# Patient Record
Sex: Female | Born: 1940 | Race: White | Hispanic: No | Smoking: Former smoker
Health system: Southern US, Community
[De-identification: ages and names within clinical notes are randomized; demographics above are authoritative.]

## PROBLEM LIST (undated history)

## (undated) DIAGNOSIS — I499 Cardiac arrhythmia, unspecified: Secondary | ICD-10-CM

## (undated) DIAGNOSIS — R42 Dizziness and giddiness: Secondary | ICD-10-CM

## (undated) DIAGNOSIS — Z923 Personal history of irradiation: Secondary | ICD-10-CM

## (undated) DIAGNOSIS — I340 Nonrheumatic mitral (valve) insufficiency: Secondary | ICD-10-CM

## (undated) DIAGNOSIS — D649 Anemia, unspecified: Secondary | ICD-10-CM

## (undated) DIAGNOSIS — R7303 Prediabetes: Secondary | ICD-10-CM

## (undated) DIAGNOSIS — F5104 Psychophysiologic insomnia: Secondary | ICD-10-CM

## (undated) DIAGNOSIS — K219 Gastro-esophageal reflux disease without esophagitis: Secondary | ICD-10-CM

## (undated) DIAGNOSIS — F32A Depression, unspecified: Secondary | ICD-10-CM

## (undated) DIAGNOSIS — R413 Other amnesia: Secondary | ICD-10-CM

## (undated) DIAGNOSIS — C50919 Malignant neoplasm of unspecified site of unspecified female breast: Secondary | ICD-10-CM

## (undated) DIAGNOSIS — I493 Ventricular premature depolarization: Secondary | ICD-10-CM

## (undated) DIAGNOSIS — I272 Pulmonary hypertension, unspecified: Secondary | ICD-10-CM

## (undated) DIAGNOSIS — Z8619 Personal history of other infectious and parasitic diseases: Secondary | ICD-10-CM

## (undated) DIAGNOSIS — C801 Malignant (primary) neoplasm, unspecified: Secondary | ICD-10-CM

## (undated) DIAGNOSIS — I341 Nonrheumatic mitral (valve) prolapse: Secondary | ICD-10-CM

## (undated) DIAGNOSIS — IMO0001 Reserved for inherently not codable concepts without codable children: Secondary | ICD-10-CM

## (undated) DIAGNOSIS — T884XXA Failed or difficult intubation, initial encounter: Secondary | ICD-10-CM

## (undated) DIAGNOSIS — G4730 Sleep apnea, unspecified: Secondary | ICD-10-CM

## (undated) DIAGNOSIS — T4145XA Adverse effect of unspecified anesthetic, initial encounter: Secondary | ICD-10-CM

## (undated) DIAGNOSIS — R0601 Orthopnea: Secondary | ICD-10-CM

## (undated) DIAGNOSIS — F329 Major depressive disorder, single episode, unspecified: Secondary | ICD-10-CM

## (undated) DIAGNOSIS — M1990 Unspecified osteoarthritis, unspecified site: Secondary | ICD-10-CM

## (undated) DIAGNOSIS — E871 Hypo-osmolality and hyponatremia: Secondary | ICD-10-CM

## (undated) DIAGNOSIS — Z6837 Body mass index (BMI) 37.0-37.9, adult: Secondary | ICD-10-CM

## (undated) DIAGNOSIS — T8859XA Other complications of anesthesia, initial encounter: Secondary | ICD-10-CM

## (undated) DIAGNOSIS — I10 Essential (primary) hypertension: Secondary | ICD-10-CM

## (undated) HISTORY — PX: TONSILLECTOMY: SUR1361

## (undated) HISTORY — PX: FOOT SURGERY: SHX648

## (undated) HISTORY — PX: REPLACEMENT TOTAL KNEE: SUR1224

## (undated) HISTORY — PX: FRACTURE SURGERY: SHX138

## (undated) HISTORY — DX: Reserved for inherently not codable concepts without codable children: IMO0001

## (undated) HISTORY — PX: PATELLA FRACTURE SURGERY: SHX735

## (undated) HISTORY — PX: OTHER SURGICAL HISTORY: SHX169

## (undated) HISTORY — PX: APPENDECTOMY: SHX54

## (undated) HISTORY — DX: Essential (primary) hypertension: I10

## (undated) HISTORY — PX: CHOLECYSTECTOMY: SHX55

## (undated) HISTORY — DX: Gastro-esophageal reflux disease without esophagitis: K21.9

## (undated) HISTORY — DX: Cardiac arrhythmia, unspecified: I49.9

---

## 2009-02-01 HISTORY — PX: JOINT REPLACEMENT: SHX530

## 2014-01-29 ENCOUNTER — Ambulatory Visit: Payer: Self-pay | Admitting: Podiatry

## 2014-01-30 DIAGNOSIS — J208 Acute bronchitis due to other specified organisms: Secondary | ICD-10-CM | POA: Insufficient documentation

## 2014-02-05 ENCOUNTER — Encounter: Payer: Self-pay | Admitting: Podiatry

## 2014-02-05 ENCOUNTER — Ambulatory Visit (INDEPENDENT_AMBULATORY_CARE_PROVIDER_SITE_OTHER): Payer: Medicare Other | Admitting: Podiatry

## 2014-02-05 VITALS — BP 117/61 | HR 68 | Resp 16 | Ht 63.5 in | Wt 206.0 lb

## 2014-02-05 DIAGNOSIS — B351 Tinea unguium: Secondary | ICD-10-CM

## 2014-02-05 DIAGNOSIS — M79676 Pain in unspecified toe(s): Secondary | ICD-10-CM

## 2014-02-05 NOTE — Progress Notes (Signed)
   Subjective:    Patient ID: Courtney Lynn, female    DOB: September 12, 1940, 74 y.o.   MRN: [id number]  HPI Comments: 74 year old female presents the office for painful elongated toenails. She states that she recently moved here from [place] where she regularly saw a podiatrist and she would like to get established here. She says her nails are painful particularly with shoe gear. She denies any redness or drainage from around the nail sites.  She denies any recent injury or trauma to bilateral lower extremities. She states that she used to wear custom orthotics in her shoes however made her feel as if she is walking on the ball of her foot and her the area and she discontinued wearing them. Since discontinuing wearing the orthotic she's had no pain to her feet. Other than her nails, she has no other complaints at this time.     Review of Systems  Musculoskeletal: Positive for back pain.  All other systems reviewed and are negative.      Objective:   Physical Exam AAO 3, NAD DP/PT pulses palpable, CRT less than 3 seconds Protective sensation intact with Simms Weinstein monofilament, vibratory sensation intact, Achilles tendon reflex intact. Nails hypertrophic, dystrophic, elongated, brittle, discolored 10. No surrounding erythema or drainage from around the nail sites. No open lesions or pre-ulcerative lesions. No interdigital maceration. Bilateral rigid hammertoe contractures and bunion deformity with a right greater than left. No areas of pinpoint bony tenderness or pain with vibratory sensation to bilateral lower extremity. No overlying edema, erythema, increase in warmth bilaterally. MMT 5/5, ROM WNL No pain with calf compression, swelling, warmth, erythema.       Assessment & Plan:  74 year old female with symptomatic onychomycosis -Treatment options were discussed the patient including alternatives, risks, complications. -Nails were sharply debrided 10 without complications to  patient comfort. -Discussed the importance of daily foot inspection. -Follow-up in 9 weeks or sooner should any problems arise. In the meantime, encouraged call the office with any questions, concerns, change in symptoms. Follow-up with PCP for other issues mentioned in the ROS.

## 2014-03-28 DIAGNOSIS — R7303 Prediabetes: Secondary | ICD-10-CM | POA: Insufficient documentation

## 2014-03-28 DIAGNOSIS — F325 Major depressive disorder, single episode, in full remission: Secondary | ICD-10-CM | POA: Insufficient documentation

## 2014-03-28 DIAGNOSIS — K219 Gastro-esophageal reflux disease without esophagitis: Secondary | ICD-10-CM | POA: Insufficient documentation

## 2014-03-28 DIAGNOSIS — I10 Essential (primary) hypertension: Secondary | ICD-10-CM | POA: Insufficient documentation

## 2014-03-28 DIAGNOSIS — F329 Major depressive disorder, single episode, unspecified: Secondary | ICD-10-CM | POA: Insufficient documentation

## 2014-03-28 DIAGNOSIS — Z6837 Body mass index (BMI) 37.0-37.9, adult: Secondary | ICD-10-CM | POA: Insufficient documentation

## 2014-03-28 DIAGNOSIS — M170 Bilateral primary osteoarthritis of knee: Secondary | ICD-10-CM | POA: Insufficient documentation

## 2014-03-28 DIAGNOSIS — F32A Depression, unspecified: Secondary | ICD-10-CM | POA: Insufficient documentation

## 2014-03-28 DIAGNOSIS — G4700 Insomnia, unspecified: Secondary | ICD-10-CM | POA: Insufficient documentation

## 2014-03-28 DIAGNOSIS — F5104 Psychophysiologic insomnia: Secondary | ICD-10-CM | POA: Insufficient documentation

## 2014-03-28 DIAGNOSIS — E668 Other obesity: Secondary | ICD-10-CM | POA: Insufficient documentation

## 2014-04-23 DIAGNOSIS — D649 Anemia, unspecified: Secondary | ICD-10-CM | POA: Insufficient documentation

## 2014-05-07 ENCOUNTER — Ambulatory Visit (INDEPENDENT_AMBULATORY_CARE_PROVIDER_SITE_OTHER): Payer: Medicare Other | Admitting: Podiatry

## 2014-05-07 ENCOUNTER — Encounter: Payer: Self-pay | Admitting: Podiatry

## 2014-05-07 DIAGNOSIS — M79676 Pain in unspecified toe(s): Secondary | ICD-10-CM

## 2014-05-07 DIAGNOSIS — B351 Tinea unguium: Secondary | ICD-10-CM | POA: Diagnosis not present

## 2014-05-07 NOTE — Progress Notes (Signed)
Patient ID: Courtney Lynn, female   DOB: July 03, 1940, 74 y.o.   MRN: [id number]  Subjective: 74 y.o.-year-old female returns the office today for painful, elongated, thickened toenails which she is unable to trim herself. Denies any redness or drainage around the nails. Consider night she does have some discomfort to her feet after being on them all day. She denies any swelling or redness. Denies any history of injury or trauma. She has no pain throughout the day with ambulation. Denies any acute changes since last appointment and no new complaints today. Denies any systemic complaints such as fevers, chills, nausea, vomiting.   Objective: AAO 3, NAD DP/PT pulses palpable, CRT less than 3 seconds Protective sensation intact with Simms Weinstein monofilament, Achilles tendon reflex intact.  Nails hypertrophic, dystrophic, elongated, brittle, discolored 10. There is tenderness overlying the nails 1-5 bilaterally. There is no surrounding erythema or drainage along the nail sites. No open lesions or pre-ulcerative lesions are identified.  Subjectively over on the dorsal aspect in the midfoot bilaterally is where the majority the discomfort is at nighttime. Currently there is no areas of pinpoint bony tenderness or pain with vibratory sensation. There is no overlying edema, erythema, increase in warmth bilaterally. There is hammertoe contractures as well as HAV deformity present. There is a decrease in medial arch height upon weightbearing. Dorsal exostosis palpable off of the midfoot. No pain with calf compression, swelling, warmth, erythema.  Assessment: Patient presents with symptomatic onychomycosis; midfoot arthritis  Plan: -Treatment options including alternatives, risks, complications were discussed -Nails sharply debrided 10 without complication/bleeding. -Patient likely benefit from diabetic shoes and inserts in her foot type. A prescription for diabetic shoes were given to the patient to go to  [facility].  -Discussed daily foot inspection. If there are any changes, to call the office immediately.  -Follow-up in 3 months or sooner if any problems are to arise. In the meantime, encouraged to call the office with any questions, concerns, changes symptoms.

## 2014-07-01 ENCOUNTER — Observation Stay
Admission: EM | Admit: 2014-07-01 | Discharge: 2014-07-02 | Disposition: A | Discharge status: Home / Self Care | Payer: Medicare Other | Attending: Internal Medicine | Admitting: Internal Medicine

## 2014-07-01 ENCOUNTER — Encounter: Payer: Self-pay | Admitting: Emergency Medicine

## 2014-07-01 ENCOUNTER — Emergency Department: Payer: Medicare Other

## 2014-07-01 DIAGNOSIS — Z880 Allergy status to penicillin: Secondary | ICD-10-CM | POA: Diagnosis not present

## 2014-07-01 DIAGNOSIS — D649 Anemia, unspecified: Secondary | ICD-10-CM | POA: Diagnosis not present

## 2014-07-01 DIAGNOSIS — R531 Weakness: Secondary | ICD-10-CM | POA: Insufficient documentation

## 2014-07-01 DIAGNOSIS — Z8489 Family history of other specified conditions: Secondary | ICD-10-CM | POA: Diagnosis not present

## 2014-07-01 DIAGNOSIS — E871 Hypo-osmolality and hyponatremia: Secondary | ICD-10-CM | POA: Insufficient documentation

## 2014-07-01 DIAGNOSIS — K219 Gastro-esophageal reflux disease without esophagitis: Secondary | ICD-10-CM | POA: Insufficient documentation

## 2014-07-01 DIAGNOSIS — Z965 Presence of tooth-root and mandibular implants: Secondary | ICD-10-CM | POA: Diagnosis not present

## 2014-07-01 DIAGNOSIS — M1990 Unspecified osteoarthritis, unspecified site: Secondary | ICD-10-CM | POA: Insufficient documentation

## 2014-07-01 DIAGNOSIS — I10 Essential (primary) hypertension: Secondary | ICD-10-CM | POA: Insufficient documentation

## 2014-07-01 DIAGNOSIS — Z79899 Other long term (current) drug therapy: Secondary | ICD-10-CM | POA: Insufficient documentation

## 2014-07-01 DIAGNOSIS — F329 Major depressive disorder, single episode, unspecified: Secondary | ICD-10-CM | POA: Diagnosis not present

## 2014-07-01 DIAGNOSIS — Z881 Allergy status to other antibiotic agents status: Secondary | ICD-10-CM | POA: Diagnosis not present

## 2014-07-01 DIAGNOSIS — Z833 Family history of diabetes mellitus: Secondary | ICD-10-CM | POA: Diagnosis not present

## 2014-07-01 DIAGNOSIS — R001 Bradycardia, unspecified: Secondary | ICD-10-CM | POA: Diagnosis present

## 2014-07-01 DIAGNOSIS — Z96651 Presence of right artificial knee joint: Secondary | ICD-10-CM | POA: Diagnosis not present

## 2014-07-01 DIAGNOSIS — G4730 Sleep apnea, unspecified: Secondary | ICD-10-CM | POA: Insufficient documentation

## 2014-07-01 HISTORY — DX: Unspecified osteoarthritis, unspecified site: M19.90

## 2014-07-01 HISTORY — DX: Major depressive disorder, single episode, unspecified: F32.9

## 2014-07-01 HISTORY — DX: Anemia, unspecified: D64.9

## 2014-07-01 HISTORY — DX: Depression, unspecified: F32.A

## 2014-07-01 HISTORY — DX: Sleep apnea, unspecified: G47.30

## 2014-07-01 LAB — CBC WITH DIFFERENTIAL/PLATELET
Basophils Absolute: 0.1 10*3/uL (ref 0–0.1)
Basophils Relative: 2 %
EOS ABS: 0.2 10*3/uL (ref 0–0.7)
Eosinophils Relative: 3 %
HCT: 37.1 % (ref 35.0–47.0)
Hemoglobin: 12.3 g/dL (ref 12.0–16.0)
Lymphocytes Relative: 22 %
Lymphs Abs: 1.6 10*3/uL (ref 1.0–3.6)
MCH: 29.6 pg (ref 26.0–34.0)
MCHC: 33.1 g/dL (ref 32.0–36.0)
MCV: 89.4 fL (ref 80.0–100.0)
MONOS PCT: 9 %
Monocytes Absolute: 0.6 10*3/uL (ref 0.2–0.9)
NEUTROS PCT: 64 %
Neutro Abs: 5 10*3/uL (ref 1.4–6.5)
PLATELETS: 299 10*3/uL (ref 150–440)
RBC: 4.15 MIL/uL (ref 3.80–5.20)
RDW: 14.2 % (ref 11.5–14.5)
WBC: 7.6 10*3/uL (ref 3.6–11.0)

## 2014-07-01 LAB — URINALYSIS COMPLETE WITH MICROSCOPIC (ARMC ONLY)
BILIRUBIN URINE: NEGATIVE
Bacteria, UA: NONE SEEN
Glucose, UA: NEGATIVE mg/dL
Hgb urine dipstick: NEGATIVE
Ketones, ur: NEGATIVE mg/dL
Leukocytes, UA: NEGATIVE
Nitrite: NEGATIVE
PH: 6 (ref 5.0–8.0)
Protein, ur: NEGATIVE mg/dL
RBC / HPF: NONE SEEN RBC/hpf (ref 0–5)
SPECIFIC GRAVITY, URINE: 1.004 — AB (ref 1.005–1.030)

## 2014-07-01 LAB — CBC
HCT: 37.3 % (ref 35.0–47.0)
Hemoglobin: 12.4 g/dL (ref 12.0–16.0)
MCH: 29.4 pg (ref 26.0–34.0)
MCHC: 33.2 g/dL (ref 32.0–36.0)
MCV: 88.6 fL (ref 80.0–100.0)
Platelets: 298 10*3/uL (ref 150–440)
RBC: 4.21 MIL/uL (ref 3.80–5.20)
RDW: 14 % (ref 11.5–14.5)
WBC: 6.7 10*3/uL (ref 3.6–11.0)

## 2014-07-01 LAB — CREATININE, SERUM
Creatinine, Ser: 0.91 mg/dL (ref 0.44–1.00)
GFR calc non Af Amer: >60 mL/min (ref >60)

## 2014-07-01 LAB — COMPREHENSIVE METABOLIC PANEL
ALBUMIN: 3.8 g/dL (ref 3.5–5.0)
ALK PHOS: 57 U/L (ref 38–126)
ALT: 42 U/L (ref 14–54)
AST: 36 U/L (ref 15–41)
Anion gap: 7 (ref 5–15)
BUN: 29 mg/dL — ABNORMAL HIGH (ref 6–20)
CO2: 22 mmol/L (ref 22–32)
Calcium: 8.3 mg/dL — ABNORMAL LOW (ref 8.9–10.3)
Chloride: 96 mmol/L — ABNORMAL LOW (ref 101–111)
Creatinine, Ser: 1.04 mg/dL — ABNORMAL HIGH (ref 0.44–1.00)
GFR calc Af Amer: 59 mL/min — ABNORMAL LOW (ref >60)
GFR, EST NON AFRICAN AMERICAN: 51 mL/min — AB (ref >60)
Glucose, Bld: 87 mg/dL (ref 65–99)
Potassium: 3.8 mmol/L (ref 3.5–5.1)
SODIUM: 125 mmol/L — AB (ref 135–145)
TOTAL PROTEIN: 6.7 g/dL (ref 6.5–8.1)
Total Bilirubin: 0.4 mg/dL (ref 0.3–1.2)

## 2014-07-01 LAB — TSH: TSH: 0.652 u[IU]/mL (ref 0.350–4.500)

## 2014-07-01 LAB — TROPONIN I

## 2014-07-01 LAB — T4, FREE: Free T4: 0.86 ng/dL (ref 0.61–1.12)

## 2014-07-01 MED ORDER — SODIUM CHLORIDE 0.9 % IV BOLUS (SEPSIS)
500.0000 mL | Freq: Once | INTRAVENOUS | Status: AC
  Administered 2014-07-01: 500 mL via INTRAVENOUS

## 2014-07-01 MED ORDER — SODIUM CHLORIDE 0.9 % IJ SOLN
3.0000 mL | Freq: Two times a day (BID) | INTRAMUSCULAR | Status: DC
  Administered 2014-07-02: 3 mL via INTRAVENOUS

## 2014-07-01 MED ORDER — ACETAMINOPHEN 650 MG RE SUPP: 650.0000 mg | Freq: Four times a day (QID) | RECTAL | Status: DC | PRN

## 2014-07-01 MED ORDER — POLYETHYLENE GLYCOL 3350 17 G PO PACK
17.0000 g | PACK | Freq: Every day | ORAL | Status: DC | PRN
Start: 2014-07-01 — End: 2014-07-02

## 2014-07-01 MED ORDER — HEPARIN SODIUM (PORCINE) 5000 UNIT/ML IJ SOLN
5000.0000 [IU] | Freq: Three times a day (TID) | INTRAMUSCULAR | Status: DC
  Filled 2014-07-01: qty 1

## 2014-07-01 MED ORDER — ACETAMINOPHEN 325 MG PO TABS: 650.0000 mg | ORAL_TABLET | Freq: Four times a day (QID) | ORAL | Status: DC | PRN

## 2014-07-01 NOTE — ED Provider Notes (Signed)
[facility] Emergency Department Provider Note  ____________________________________________  Time seen: Approximately 5:07 PM  I have reviewed the triage vital signs and the nursing notes.   HISTORY  Chief Complaint Bradycardia and Weakness    HPI Courtney Lynn is a 74 y.o. female with history of hypertension, gastric reflux, occasional "irregular heart beat" presents for evaluation of palpitations and lightheadedness. Patient reports that she awoke this morning in her usual state of health. She recently turned from a long trip to [place]. This morning she was swimming and afterwards she noted that she was quite tired, she reports that she felt her heart rate and it was slow.  She has felt generally weak with fatigue today. She went home and checked her blood pressure and systolic was in the 00Q which she reports is abnormal for her. On EMS arrival, EKG was sinus bradycardia in the 40s which improved to heart rate of 60s without any intervention. Patient denies any chest pain or difficulty breathing today. She was always been her usual state of health without cough, sneezing, runny nose, congestion, fevers. Currently she denies any palpitations. No modifying factors.   Past Medical History  Diagnosis Date  . Hypertension   . Reflux   . GERD (gastroesophageal reflux disease)   . Irregular heart beat     There are no active problems to display for this patient.   Past Surgical History  Procedure Laterality Date  . Dental implants    . Appendectomy    . Gallbladder surgery    . Foot surgery    . Replacement total knee Right     Current Outpatient Rx  Name  Route  Sig  Dispense  Refill  . amLODipine (NORVASC) 5 MG tablet   Oral   Take 5 mg by mouth daily.         . cephALEXin (KEFLEX) 500 MG capsule   Oral   Take 500 mg by mouth 4 (four) times daily. Dental procedures         . citalopram (CELEXA) 20 MG tablet   Oral   Take 20 mg by mouth  daily.         . Esterified Estrogens (MENEST) 0.3 MG tablet   Oral   Take 0.3 mg by mouth.         . losartan-hydrochlorothiazide (HYZAAR) 50-12.5 MG per tablet   Oral   Take 1 tablet by mouth daily. 1/2 pill a day         . medroxyPROGESTERone (PROVERA) 2.5 MG tablet   Oral   Take 2.5 mg by mouth daily.         . meloxicam (MOBIC) 15 MG tablet   Oral   Take 15 mg by mouth daily.         . metoprolol succinate (TOPROL-XL) 50 MG 24 hr tablet   Oral   Take 50 mg by mouth daily. Take with or immediately following a meal.         . omeprazole (PRILOSEC) 20 MG capsule   Oral   Take 20 mg by mouth daily.           Allergies Cleocin and Pen vk  No family history on file.  Social History History  Substance Use Topics  . Smoking status: Former Research scientist (life sciences)  . Smokeless tobacco: Never Used  . Alcohol Use: No    Review of Systems Constitutional: No fever/chills Eyes: No visual changes. ENT: No sore throat. Cardiovascular: Denies chest pain. Respiratory:  Denies shortness of breath. Gastrointestinal: No abdominal pain.  No nausea, no vomiting.  No diarrhea.  No constipation. Genitourinary: Negative for dysuria. Musculoskeletal: Negative for back pain. Skin: Negative for rash. Neurological: Negative for headaches, focal weakness or numbness.  10-point ROS otherwise negative.  ____________________________________________   PHYSICAL EXAM:   Filed Vitals:   07/01/14 1712  BP: 145/67  Pulse: 62  Temp: 97.6 F (36.4 C)  TempSrc: Oral  Height: 5\' 3"  (1.6 m)  Weight: 205 lb (92.987 kg)  SpO2: 99%      Constitutional: Alert and oriented. Well appearing and in no acute distress. Eyes: Conjunctivae are normal. PERRL. EOMI. Head: Atraumatic. Nose: No congestion/rhinnorhea. Mouth/Throat: Mucous membranes are moist.  Oropharynx non-erythematous. Neck: No stridor.  Cardiovascular: Bradycardic rate, regular rhythm. Grossly normal heart sounds.  Good  peripheral circulation. Respiratory: Normal respiratory effort.  No retractions. Lungs CTAB. Gastrointestinal: Soft and nontender. No distention. No abdominal bruits. No CVA tenderness. Genitourinary: deferred Musculoskeletal: No lower extremity tenderness nor edema.  No joint effusions. Neurologic:  Normal speech and language. No gross focal neurologic deficits are appreciated. Speech is normal. No gait instability. 5 out of 5 strength in bilateral upper and lower extremities, sensation intact to light touch throughout Skin:  Skin is warm, dry and intact. No rash noted. Psychiatric: Mood and affect are normal. Speech and behavior are normal.  ____________________________________________   LABS (all labs ordered are listed, but only abnormal results are displayed)  Labs Reviewed  COMPREHENSIVE METABOLIC PANEL - Abnormal; Notable for the following:    Sodium 125 (*)    Chloride 96 (*)    BUN 29 (*)    Creatinine, Ser 1.04 (*)    Calcium 8.3 (*)    GFR calc non Af Amer 51 (*)    GFR calc Af Amer 59 (*)    All other components within normal limits  URINALYSIS COMPLETEWITH MICROSCOPIC ([facility] ONLY) - Abnormal; Notable for the following:    Color, Urine YELLOW (*)    APPearance CLEAR (*)    Specific Gravity, Urine 1.004 (*)    Squamous Epithelial / LPF 0-5 (*)    All other components within normal limits  CBC WITH DIFFERENTIAL/PLATELET  TROPONIN I  TSH  T4, FREE   ____________________________________________  EKG  ED ECG REPORT I, Joanne Gavel, the attending physician, personally viewed and interpreted this ECG.   Date: 07/01/2014  EKG Time: 17:03  Rate: 64  Rhythm: normal sinus rhythm  Axis: normal  Intervals:first-degree A-V block   ST&T Change: No acute ST segment elevation  ____________________________________________  RADIOLOGY  CXR IMPRESSION: Mild opacification over the anterior lung bases on the lateral film which may be due to atelectasis or  infection. ____________________________________________   PROCEDURES  Procedure(s) performed: None  Critical Care performed: Yes, see critical care note(s). Total critical care time spent 35 minutes.  ____________________________________________   INITIAL IMPRESSION / ASSESSMENT AND PLAN / ED COURSE  Pertinent labs & imaging results that were available during my care of the patient were reviewed by me and considered in my medical decision making (see chart for details).  Courtney Lynn is a 74 y.o. female with history of hypertension, gastric reflux, occasional "irregular heart beat" presents for evaluation of palpitations and lightheadedness. On exam, she is generally well-appearing and in no acute distress. Exam is benign including intact neurological examination. Suspect bradycardia as cause of her lightheadedness and palpitations today which were worsened by exertion. She denies any chest pain or difficulty breathing and  I doubt ACS or PE. She does take metoprolol for blood pressure control. We'll observe her on the monitor, obtain basic labs, chest x-ray.   ----------------------------------------- 7:42 PM on 07/01/2014 -----------------------------------------  Patient noted to be hyponatremic at 125. We'll give IV fluids. Discussed with hospitalist for admission as this may be the cause of her weakness. ____________________________________________   FINAL CLINICAL IMPRESSION(S) / ED DIAGNOSES  Final diagnoses:  Hyponatremia  Bradycardia      Joanne Gavel, MD 07/01/14 1942

## 2014-07-01 NOTE — H&P (Signed)
[facility] [place] NAME: Courtney Lynn    MR#:  [id number]  DATE OF BIRTH:  07/13/1940  DATE OF ADMISSION:  07/01/2014  PRIMARY CARE PHYSICIAN: Glendon Axe, MD   REQUESTING/REFERRING PHYSICIAN: Dr Edd Fabian  CHIEF COMPLAINT:   Chief Complaint  Patient presents with  . Bradycardia  . Weakness    HISTORY OF PRESENT ILLNESS:  Courtney Lynn  is a 74 y.o. female with a known history of hypertension, depression, anemia and gastroesophageal reflux disease who presents today with bradycardia and hypotension. She has just returned from a 9 day trip to [place]. She woke up this morning in her usual state of health. She went swimming this afternoon and while swimming felt very strange. She went to the side of the pool and had a difficult time finding her pulse when she did find it was very slow and weak. She then went home and took her blood pressure which was 96/54 and her heart rate was in the 40s. She noticed that she was dizzy when trying to move around her apartment. This lasted for about 2 hours before she called EMS. In the a.m. and she had a heart rate of 46 with a first-degree AV block which then resolves and then she had a heart rate of 60. On presentation to the emergency room she is also found to be hyponatremic at 125.  PAST MEDICAL HISTORY:   Past Medical History  Diagnosis Date  . Hypertension   . Reflux   . GERD (gastroesophageal reflux disease)   . Irregular heart beat   . Sleep apnea   . Anemia   . Arthritis   . Depression     PAST SURGICAL HISTORY:   Past Surgical History  Procedure Laterality Date  . Dental implants    . Appendectomy    . Gallbladder surgery    . Foot surgery    . Replacement total knee Right     SOCIAL HISTORY:   History  Substance Use Topics  . Smoking status: Former Research scientist (life sciences)  . Smokeless tobacco: Never Used  . Alcohol Use: No    FAMILY HISTORY:   Family History  Problem Relation Age  of Onset  . GI Bleed Mother   . Diabetes Father   . Congestive Heart Failure Father     DRUG ALLERGIES:   Allergies  Allergen Reactions  . Cleocin [Clindamycin Hcl] Swelling  . Pen Vk [Penicillin V] Swelling and Other (See Comments)    Funny feeling on the tongue    REVIEW OF SYSTEMS:   Review of Systems  Constitutional: Negative for fever, chills, weight loss and malaise/fatigue.  HENT: Negative for congestion and hearing loss.   Eyes: Negative for blurred vision and pain.  Respiratory: Negative for cough, hemoptysis, sputum production, shortness of breath and stridor.   Cardiovascular: Positive for palpitations. Negative for chest pain, orthopnea and leg swelling.  Gastrointestinal: Negative for nausea, vomiting, abdominal pain, diarrhea, constipation and blood in stool.  Genitourinary: Negative for dysuria and frequency.  Musculoskeletal: Negative for myalgias, back pain, joint pain and neck pain.  Skin: Negative for rash.  Neurological: Positive for dizziness. Negative for sensory change, speech change, focal weakness, seizures, loss of consciousness and headaches.  Endo/Heme/Allergies: Does not bruise/bleed easily.  Psychiatric/Behavioral: Negative for depression and hallucinations. The patient is not nervous/anxious.     MEDICATIONS AT HOME:   Prior to Admission medications   Medication Sig Start Date End Date Taking? Authorizing Provider  amLODipine (NORVASC) 10 MG tablet Take 10 mg by mouth every evening.    Yes Historical Provider, MD  citalopram (CELEXA) 20 MG tablet Take 20 mg by mouth daily. Pt states weaning self off of medication and only takes 1/2 tablet every fourth night   Yes Historical Provider, MD  meloxicam (MOBIC) 15 MG tablet Take 15 mg by mouth daily. Pt takes 1/2 tablet daily   Yes Historical Provider, MD  metoprolol succinate (TOPROL-XL) 50 MG 24 hr tablet Take 50 mg by mouth 2 (two) times daily. Pt takes 1/2 tablet in the morning and 1/2 tablet every  evening   Yes Historical Provider, MD  omeprazole (PRILOSEC) 20 MG capsule Take 20 mg by mouth 2 (two) times daily.    Yes Historical Provider, MD  cephALEXin (KEFLEX) 500 MG capsule Take 500 mg by mouth 4 (four) times daily. Dental procedures    Historical Provider, MD      VITAL SIGNS:  Blood pressure 145/67, pulse 62, temperature 97.6 F (36.4 C), temperature source Oral, height 5\' 3"  (1.6 m), weight 92.987 kg (205 lb), SpO2 99 %.  PHYSICAL EXAMINATION:  GENERAL:  74 y.o.-year-old patient lying in the bed with no acute distress.  EYES: Pupils equal, round, reactive to light and accommodation. No scleral icterus. Extraocular muscles intact.  HEENT: Head atraumatic, normocephalic. Oropharynx and nasopharynx clear. Oral mucosa pink and moist good dentition NECK:  Supple, no jugular venous distention. No thyroid enlargement, no tenderness.  LUNGS: Normal breath sounds bilaterally, no wheezing, rales,rhonchi or crepitation. No use of accessory muscles of respiration.  CARDIOVASCULAR: S1, S2 normal. No murmurs, rubs, or gallops. Bout 60 bpm ABDOMEN: Soft, nontender, nondistended. Bowel sounds present. No organomegaly or mass.  EXTREMITIES: No pedal edema, cyanosis, or clubbing.  NEUROLOGIC: Cranial nerves II through XII are intact. Muscle strength 5/5 in all extremities. Sensation intact. Gait not checked.  PSYCHIATRIC: The patient is alert and oriented x 3.  SKIN: No obvious rash, lesion, or ulcer.   LABORATORY PANEL:   CBC  Recent Labs Lab 07/01/14 1728  WBC 7.6  HGB 12.3  HCT 37.1  PLT 299   ------------------------------------------------------------------------------------------------------------------  Chemistries   Recent Labs Lab 07/01/14 1728  NA 125*  K 3.8  CL 96*  CO2 22  GLUCOSE 87  BUN 29*  CREATININE 1.04*  CALCIUM 8.3*  AST 36  ALT 42  ALKPHOS 57  BILITOT 0.4    ------------------------------------------------------------------------------------------------------------------  Cardiac Enzymes  Recent Labs Lab 07/01/14 1728  TROPONINI <0.03   ------------------------------------------------------------------------------------------------------------------  RADIOLOGY:  Dg Chest 2 View  07/01/2014   CLINICAL DATA:  Fatigue with irregular heartbeat.  EXAM: CHEST  2 VIEW  COMPARISON:  None.  FINDINGS: Lungs are adequately inflated without effusion or pneumothorax. Mild opacification over the anterior lung base on the lateral film as cannot exclude atelectasis versus infection. Cardiomediastinal silhouette is within normal. There are mild degenerative changes of the spine  IMPRESSION: Mild opacification over the anterior lung bases on the lateral film which may be due to atelectasis or infection.   Electronically Signed   By: Marin Olp M.D.   On: 07/01/2014 18:06    EKG:  No orders found for this or any previous visit.  IMPRESSION AND PLAN:   Principal Problem:   Bradycardia Active Problems:   Hypertension   Hyponatremia   problem #1 symptomatic bradycardia: Seems to have resolved and she is now in normal sinus rhythm in the 60s. We'll hold metoprolol. Monitor on telemetry. Normalize sodium. Cycle  cardiac enzymes, first is negative. Cardiology consultation of the discretion of primary team.  #2 hyponatremia: She states she has never been told she was hyponatremic before. We'll check a TSH. She is on Celexa but has tapered her dose to about 5 mg every 4 days I do not think this is the culprit. She is recently spent 9 days in [place] states that she drank a lot of water and does not often use salt in her food. She may have hyponatremia as a result of sweating and inadequate replacement. BUN and creatinine also elevated possibly dehydrated. Will check urine sodium and osmolality. Will start normal saline.  #3 hypertension: Was bradycardic  earlier today and has now normalized. Will hold antihypertensives for tonight.  All the records are reviewed and case discussed with ED provider. Management plans discussed with the patient, family and they are in agreement.  CODE STATUS: Full  TOTAL TIME TAKING CARE OF THIS PATIENT: 45 minutes.    Myrtis Ser M.D on 07/01/2014 at 8:20 PM  Between 7am to 6pm - Pager - [phone]  After 6pm go to [email or web address] - password [email or web address] [facility]  Office  [phone]  CC: Primary care physician; Glendon Axe, MD

## 2014-07-01 NOTE — ED Notes (Signed)
Patient to ER from home for c/o weakness. States she just returned from long trip to [place] where she did a lot of walking. Was swimming today, noticed marked fatigue after only 5 laps. Patient was found to be sinus brady upon EMS arrival. Converted back to NSR upon being transferred to ambulance.

## 2014-07-02 DIAGNOSIS — R001 Bradycardia, unspecified: Secondary | ICD-10-CM | POA: Diagnosis not present

## 2014-07-02 LAB — BASIC METABOLIC PANEL
ANION GAP: 7 (ref 5–15)
BUN: 22 mg/dL — AB (ref 6–20)
CO2: 26 mmol/L (ref 22–32)
Calcium: 9.2 mg/dL (ref 8.9–10.3)
Chloride: 105 mmol/L (ref 101–111)
Creatinine, Ser: 0.8 mg/dL (ref 0.44–1.00)
GFR calc non Af Amer: >60 mL/min (ref >60)
Glucose, Bld: 96 mg/dL (ref 65–99)
POTASSIUM: 3.7 mmol/L (ref 3.5–5.1)
Sodium: 138 mmol/L (ref 135–145)

## 2014-07-02 LAB — CBC
HCT: 36.9 % (ref 35.0–47.0)
Hemoglobin: 12.1 g/dL (ref 12.0–16.0)
MCH: 29 pg (ref 26.0–34.0)
MCHC: 32.7 g/dL (ref 32.0–36.0)
MCV: 88.5 fL (ref 80.0–100.0)
Platelets: 316 10*3/uL (ref 150–440)
RBC: 4.17 MIL/uL (ref 3.80–5.20)
RDW: 14.2 % (ref 11.5–14.5)
WBC: 6.5 10*3/uL (ref 3.6–11.0)

## 2014-07-02 LAB — TROPONIN I: Troponin I: <0.03 ng/mL (ref <0.031)

## 2014-07-02 LAB — SODIUM, URINE, RANDOM: Sodium, Ur: 85 mmol/L

## 2014-07-02 LAB — OSMOLALITY, URINE: Osmolality, Ur: 489 mOsm/kg (ref 300–900)

## 2014-07-02 NOTE — Discharge Summary (Signed)
Physician Discharge Summary  Patient ID: Courtney Lynn MRN: [id number] DOB/AGE: 76/24/1942 76 y.o.  Admit date: 07/01/2014 Discharge date: 07/02/2014  Admission Diagnoses: Dizziness, bradycardia and hyponatremia.   Discharge Diagnoses:  Principal Problem:   Bradycardia Active Problems:   Hypertension   Hyponatremia   Discharged Condition: stable  Hospital Course: 74 year old lady with history of hypertension, depression, anemia and gastroesophageal reflux presented to the ED for bradycardia and hypotension. She has just returned from a 9 day trip to [place]. She woke up the morning of admission in her usual state of health. She went swimming and while swimming felt dizzy. She then went home and took her blood pressure which was 96/54 and her heart rate was in the 40s. On presentation to the emergency room she is also found to be pre-renal and hyponatremic, BUN/Cr of 29/1.04 and serum sodium of 125, likely secondary to dehydration. Metoprolol was held for bradycardia. Patient was hydrated with normal saline. Serial Troponins were negative. TSH was normal. Serum sodium is 138, within normal limits today. Patient feels much better overall. She is eager to go home. No further episodes of dizziness or symptomatic bradycardia. Shall discharge patient home. Shall continue to hold Metoprolol. Follow up in the office in one week. Consider holter monitoring as out patient, if symptoms recur.  Consults: None  Significant Diagnostic Studies: cardiac graphics: Telemetry: normal sinus rhythm.  Treatments: IV hydration, telemetry monitoring, labs.  Discharge Exam: Blood pressure 129/58, pulse 61, temperature 97.4 F (36.3 C), temperature source Oral, resp. rate 18, height 5\' 3"  (1.6 m), weight 92.126 kg (203 lb 1.6 oz), SpO2 99 %. General appearance: alert, cooperative, appears stated age and no distress Head: Normocephalic, without obvious abnormality, atraumatic Neck: trachea is central, no  thyromegaly, no lymphadenopathy Resp: clear to auscultation bilaterally, no rhonchi, no crackles Cardio: regular rate and rhythm, S1, S2 normal, no S3. GI: soft, non-tender; no organomegaly Extremities: extremities normal, atraumatic, no cyanosis or edema Skin: Skin color, texture, turgor normal. No rashes or lesions Neurologic: Alert and oriented X 3, normal strength and tone.   Disposition: Final discharge disposition: home.     Medication List    ASK your doctor about these medications        amLODipine 10 MG tablet  Commonly known as:  NORVASC  Take 10 mg by mouth every evening.     cephALEXin 500 MG capsule  Commonly known as:  KEFLEX  Take 500 mg by mouth 4 (four) times daily. Dental procedures     citalopram 20 MG tablet  Commonly known as:  CELEXA  Take 20 mg by mouth daily. Pt states weaning self off of medication and only takes 1/2 tablet every fourth night     meloxicam 15 MG tablet  Commonly known as:  MOBIC  Take 15 mg by mouth daily. Pt takes 1/2 tablet daily        omeprazole 20 MG capsule  Commonly known as:  PRILOSEC  Take 20 mg by mouth 2 (two) times daily.         Signed: Delfin Squillace 07/02/2014, 1:28 PM

## 2014-07-02 NOTE — Discharge Instructions (Signed)
°  Bradycardia Bradycardia is a term for a heart rate (pulse) that, in adults, is slower than 60 beats per minute. A normal rate is 60 to 100 beats per minute. A heart rate below 60 beats per minute may be normal for some adults with healthy hearts. If the rate is too slow, the heart may have trouble pumping the volume of blood the body needs. If the heart rate gets too low, blood flow to the brain may be decreased and may make you feel lightheaded, dizzy, or faint. The heart has a natural pacemaker in the top of the heart called the SA node (sinoatrial or sinus node). This pacemaker sends out regular electrical signals to the muscle of the heart, telling the heart muscle when to beat (contract). The electrical signal travels from the upper parts of the heart (atria) through the AV node (atrioventricular node), to the lower chambers of the heart (ventricles). The ventricles squeeze, pumping the blood from your heart to your lungs and to the rest of your body. CAUSES   Problem with the heart's electrical system.  Problem with the heart's natural pacemaker.  Heart disease, damage, or infection.  Medications.  Problems with minerals and salts (electrolytes). SYMPTOMS   Fainting (syncope).  Fatigue and weakness.  Shortness of breath (dyspnea).  Chest pain (angina).  Drowsiness.  Confusion. DIAGNOSIS   An electrocardiogram (ECG) can help your caregiver determine the type of slow heart rate you have.  If the cause is not seen on an ECG, you may need to wear a heart monitor that records your heart rhythm for several hours or days.  Blood tests. TREATMENT   Electrolyte supplements.  Medications.  Withholding medication which is causing a slow heart rate.  Pacemaker placement. SEEK IMMEDIATE MEDICAL CARE IF:   You feel lightheaded or faint.  You develop an irregular heart rate.  You feel chest pain or have trouble breathing. MAKE SURE YOU:   Understand these  instructions.  Will watch your condition.  Will get help right away if you are not doing well or get worse. Document Released: 10/10/2001 Document Revised: 04/12/2011 Document Reviewed: 04/25/2013 [facility] Patient Information 2015 [employer], [place]. This information is not intended to replace advice given to you by your health care provider. Make sure you discuss any questions you have with your health care provider.  Hold Metoprolol. Follow up in the office in one week.

## 2014-07-02 NOTE — Progress Notes (Signed)
Discharge patient home per MD, IV site DC d, bleeding controllled, tele monitor turned in, pt left hospital in car with her friend

## 2014-08-20 ENCOUNTER — Encounter: Payer: Self-pay | Admitting: Podiatry

## 2014-08-20 ENCOUNTER — Ambulatory Visit (INDEPENDENT_AMBULATORY_CARE_PROVIDER_SITE_OTHER): Payer: Medicare Other | Admitting: Podiatry

## 2014-08-20 VITALS — BP 119/53 | HR 70 | Resp 16

## 2014-08-20 DIAGNOSIS — B351 Tinea unguium: Secondary | ICD-10-CM

## 2014-08-20 DIAGNOSIS — M79676 Pain in unspecified toe(s): Secondary | ICD-10-CM | POA: Diagnosis not present

## 2014-08-20 NOTE — Progress Notes (Signed)
Patient ID: Courtney Lynn, female   DOB: January 11, 1941, 74 y.o.   MRN: [id number]  Subjective: 74 y.o. female returns the office today for painful, elongated, thickened toenails which she is unable to do herself. Denies any redness or drainage around the nails. Denies any acute changes since last appointment and no new complaints today. Denies any systemic complaints such as fevers, chills, nausea, vomiting.   Objective: AAO 3, NAD DP/PT pulses palpable, CRT less than 3 seconds Protective sensation intact with Simms Weinstein monofilament, Achilles tendon reflex intact.  Nails hypertrophic, dystrophic, elongated, brittle, discolored 10. There is tenderness overlying the nails 1-5 bilaterally. There is no surrounding erythema or drainage along the nail sites. No open lesions or pre-ulcerative lesions are identified. No other areas of tenderness bilateral lower extremities. No overlying edema, erythema, increased warmth. No pain with calf compression, swelling, warmth, erythema.  Assessment: Patient presents with symptomatic onychomycosis  Plan: -Treatment options including alternatives, risks, complications were discussed -Nails sharply debrided 10 without complication/bleeding. -Discussed daily foot inspection. If there are any changes, to call the office immediately.  -Follow-up in 3 months or sooner if any problems are to arise. In the meantime, encouraged to call the office with any questions, concerns, changes symptoms.   Celesta Gentile, DPM

## 2014-09-10 DIAGNOSIS — I272 Pulmonary hypertension, unspecified: Secondary | ICD-10-CM | POA: Insufficient documentation

## 2014-09-10 DIAGNOSIS — I493 Ventricular premature depolarization: Secondary | ICD-10-CM | POA: Insufficient documentation

## 2014-09-10 DIAGNOSIS — I341 Nonrheumatic mitral (valve) prolapse: Secondary | ICD-10-CM | POA: Insufficient documentation

## 2014-09-10 DIAGNOSIS — I340 Nonrheumatic mitral (valve) insufficiency: Secondary | ICD-10-CM | POA: Insufficient documentation

## 2014-09-10 DIAGNOSIS — I2729 Other secondary pulmonary hypertension: Secondary | ICD-10-CM | POA: Insufficient documentation

## 2014-09-17 ENCOUNTER — Other Ambulatory Visit: Payer: Self-pay | Admitting: Internal Medicine

## 2014-09-17 DIAGNOSIS — Z1231 Encounter for screening mammogram for malignant neoplasm of breast: Secondary | ICD-10-CM

## 2014-09-19 ENCOUNTER — Other Ambulatory Visit: Payer: Self-pay | Admitting: Internal Medicine

## 2014-09-19 ENCOUNTER — Ambulatory Visit
Admission: RE | Admit: 2014-09-19 | Discharge: 2014-09-19 | Disposition: A | Discharge status: Home / Self Care | Payer: Medicare Other | Source: Ambulatory Visit | Attending: Internal Medicine | Admitting: Internal Medicine

## 2014-09-19 DIAGNOSIS — Z1231 Encounter for screening mammogram for malignant neoplasm of breast: Secondary | ICD-10-CM

## 2014-11-25 ENCOUNTER — Ambulatory Visit: Payer: Medicare Other

## 2014-11-26 ENCOUNTER — Ambulatory Visit (INDEPENDENT_AMBULATORY_CARE_PROVIDER_SITE_OTHER): Payer: Medicare Other | Admitting: Sports Medicine

## 2014-11-26 ENCOUNTER — Encounter: Payer: Self-pay | Admitting: Sports Medicine

## 2014-11-26 DIAGNOSIS — B353 Tinea pedis: Secondary | ICD-10-CM

## 2014-11-26 DIAGNOSIS — M79676 Pain in unspecified toe(s): Secondary | ICD-10-CM | POA: Diagnosis not present

## 2014-11-26 DIAGNOSIS — B351 Tinea unguium: Secondary | ICD-10-CM | POA: Diagnosis not present

## 2014-11-26 MED ORDER — KETOCONAZOLE 2 % EX GEL: 1.0000 "application " | Freq: Two times a day (BID) | CUTANEOUS | Status: DC

## 2014-11-26 NOTE — Progress Notes (Signed)
Patient ID: Courtney Lynn, female   DOB: April 14, 1940, 74 y.o.   MRN: [id number] Subjective: Courtney Lynn is a 74 y.o. female patient seen today in office with complaint of thickened and elongated toenails; unable to trim. Patient is also concerned with itchiness between 4-5 toe on right; states that she has been using lotion and OTC fungal cream but foot feels itchy.  Patient denies history of Diabetes, Neuropathy, or Vascular disease. Patient has no other pedal complaints at this time.   Patient Active Problem List   Diagnosis Date Noted  . 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 Hypertension 07/01/2014  . Hyponatremia 07/01/2014   Current Outpatient Prescriptions on File Prior to Visit  Medication Sig Dispense Refill  . amLODipine (NORVASC) 10 MG tablet Take 10 mg by mouth every evening.     . meloxicam (MOBIC) 15 MG tablet Take 15 mg by mouth daily. Pt takes 1/2 tablet daily    . omeprazole (PRILOSEC) 20 MG capsule Take 20 mg by mouth 2 (two) times daily.      No current facility-administered medications on file prior to visit.   Allergies  Allergen Reactions  . Cleocin [Clindamycin Hcl] Swelling and Rash    Tongue, lip swelling Tongue, lip swelling  . Pen Vk [Penicillin V] Swelling, Other (See Comments) and Rash    Tongue, lip swelling Funny feeling on the tongue     Objective: Physical Exam  General: Well developed, nourished, no acute distress, awake, alert and oriented x 3  Vascular: Dorsalis pedis artery 2/4 bilateral, Posterior tibial artery 1/4 bilateral, skin temperature warm to warm proximal to distal bilateral lower extremities, no varicosities, pedal hair present bilateral.  Neurological: Gross sensation present via light touch bilateral.   Dermatological: Skin is warm, dry, and supple bilateral, Nails 1-10 are tender, long, thick, and discolored with mild subungal debris, mild 4th right>left webspace macerations present, no open lesions present bilateral, no  callus/corns/hyperkeratotic tissue present bilateral. There is plantar scaling in a mocasin distrubution consistent with tinea. No signs of infection bilateral.  Musculoskeletal: Asymptomatic bunion and hammertoe deformities noted on right. Muscular strength within normal limits without pain or limitation on range of motion. No pain with calf compression bilateral.  Assessment and Plan:  Problem List Items Addressed This Visit    None    Visit Diagnoses    Dermatophytosis of nail    -  Primary    Relevant Medications    Ketoconazole 2 % GEL    Pain of toe, unspecified laterality        Tinea pedis of both feet        Relevant Medications    Ketoconazole 2 % GEL      -Examined patient.  -Discussed treatment options for painful mycotic nails and fungal skin changes. -Mechanically debrided and reduced mycotic nails with sterile nail nipper and dremel nail file without incident. -Rx Ketaconazole gel for interspaces. Patient to continue with Lamisil AT or similar cream for plantar surfaces of both feet.  -Patient to return in 3 months for follow up evaluation or sooner if symptoms worsen.  Landis Martins, DPM

## 2014-11-28 ENCOUNTER — Telehealth: Payer: Self-pay | Admitting: *Deleted

## 2014-12-02 ENCOUNTER — Telehealth: Payer: Self-pay | Admitting: *Deleted

## 2014-12-02 MED ORDER — KETOCONAZOLE 2 % EX CREA: 1.0000 "application " | TOPICAL_CREAM | Freq: Every day | CUTANEOUS | Status: DC

## 2014-12-02 NOTE — Telephone Encounter (Signed)
[facility] fax [phone] denied prior authorization of Xolegel gel 2% - Ticket#:  [id number].  Dr. Cannon Kettle changed to Ketoconazole 2% cream apply to affected bid.

## 2014-12-02 NOTE — Telephone Encounter (Signed)
Entered in error

## 2015-02-02 DIAGNOSIS — C801 Malignant (primary) neoplasm, unspecified: Secondary | ICD-10-CM

## 2015-02-02 DIAGNOSIS — Z923 Personal history of irradiation: Secondary | ICD-10-CM

## 2015-02-02 HISTORY — PX: BREAST LUMPECTOMY: SHX2

## 2015-02-02 HISTORY — DX: Personal history of irradiation: Z92.3

## 2015-02-02 HISTORY — DX: Malignant (primary) neoplasm, unspecified: C80.1

## 2015-02-25 ENCOUNTER — Encounter: Payer: Self-pay | Admitting: Sports Medicine

## 2015-02-25 ENCOUNTER — Ambulatory Visit (INDEPENDENT_AMBULATORY_CARE_PROVIDER_SITE_OTHER): Payer: PPO | Admitting: Sports Medicine

## 2015-02-25 DIAGNOSIS — L03031 Cellulitis of right toe: Secondary | ICD-10-CM

## 2015-02-25 DIAGNOSIS — L02611 Cutaneous abscess of right foot: Secondary | ICD-10-CM

## 2015-02-25 DIAGNOSIS — L89891 Pressure ulcer of other site, stage 1: Secondary | ICD-10-CM

## 2015-02-25 DIAGNOSIS — M79676 Pain in unspecified toe(s): Secondary | ICD-10-CM | POA: Diagnosis not present

## 2015-02-25 DIAGNOSIS — L97511 Non-pressure chronic ulcer of other part of right foot limited to breakdown of skin: Secondary | ICD-10-CM

## 2015-02-25 DIAGNOSIS — B353 Tinea pedis: Secondary | ICD-10-CM

## 2015-02-25 DIAGNOSIS — B351 Tinea unguium: Secondary | ICD-10-CM

## 2015-02-25 MED ORDER — CEPHALEXIN 500 MG PO CAPS: 500.0000 mg | ORAL_CAPSULE | Freq: Three times a day (TID) | ORAL | Status: DC

## 2015-02-25 NOTE — Progress Notes (Signed)
Patient ID: Anaylah Lupercio, female   DOB: 1940/04/07, 75 y.o.   MRN: [id number]  Subjective: Wendy Casteneda is a 75 y.o. female patient seen today in office with complaint of thickened and elongated toenails; unable to trim and painful corn to right 4th toe started a few weeks ago after patient started using a knitted spacer. Patient states that itchiness is better with cream  Patient denies history of Diabetes, Neuropathy, or Vascular disease. Patient has no other pedal complaints at this time.   Patient Active Problem List   Diagnosis Date Noted  . 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 Hypertension 07/01/2014  . Hyponatremia 07/01/2014   Current Outpatient Prescriptions on File Prior to Visit  Medication Sig Dispense Refill  . amLODipine (NORVASC) 10 MG tablet Take 10 mg by mouth every evening.     Marland Kitchen ketoconazole (NIZORAL) 2 % cream Apply 1 application topically daily. 15 g 0  . meloxicam (MOBIC) 15 MG tablet Take 15 mg by mouth daily. Pt takes 1/2 tablet daily    . omeprazole (PRILOSEC) 20 MG capsule Take 20 mg by mouth 2 (two) times daily.      No current facility-administered medications on file prior to visit.   Allergies  Allergen Reactions  . Cleocin [Clindamycin Hcl] Swelling, Rash and Other (See Comments)    Tongue, lip swelling Tongue, lip swelling Tongue, lip swelling  . Pen Vk [Penicillin V] Swelling, Other (See Comments) and Rash    Tongue, lip swelling Funny feeling on the tongue     Objective: Physical Exam  General: Well developed, nourished, no acute distress, awake, alert and oriented x 3  Vascular: Dorsalis pedis artery 2/4 bilateral, Posterior tibial artery 1/4 bilateral, skin temperature warm to warm proximal to distal bilateral lower extremities, no varicosities, pedal hair present bilateral.  Neurological: Gross sensation present via light touch bilateral.   Dermatological: Skin is warm, dry, and supple bilateral, Nails 1-10 are tender, long, thick, and discolored with  mild subungal debris, no 4th right>left webspace macerations present, There is no plantar scaling in a mocasin distrubution consistent with tinea any longer resolved with ketocanozole cream. Callus to dorsal 4th PIPJ, right toe once debrided puss was expressed with a partial thickness ulcer present measures 1.5x1cm; does not probe with cellulitis focal to site and mild edema.  Musculoskeletal: Asymptomatic bunion and hammertoe deformities noted on right. Muscular strength within normal limits without pain or limitation on range of motion. No pain with calf compression bilateral.  Assessment and Plan:  Problem List Items Addressed This Visit    None    Visit Diagnoses    Toe ulcer, right, limited to breakdown of skin (HCC)    -  Primary    4th toe    Relevant Medications    cephALEXin (KEFLEX) 500 MG capsule    Cellulitis and abscess of toe, right        Relevant Medications    cephALEXin (KEFLEX) 500 MG capsule    Dermatophytosis of nail        Relevant Medications    cephALEXin (KEFLEX) 500 MG capsule    Pain of toe, unspecified laterality        Tinea pedis of both feet        improved    Relevant Medications    cephALEXin (KEFLEX) 500 MG capsule      -Examined patient.  -Discussed treatment options for painful mycotic nails and right 4th toe ulcer -Mechanically debrided and reduced mycotic nails with sterile nail nipper and  dremel nail file without incident. -Debrided ulceration at right 4th toe using sterile tissue nipper  and applied antibiotic cream and Band-Aid. Advised patient to soak using Epsom salt and to re-dress the area daily dressing with antibiotic cream and Band-Aid, also prescribed Keflex 500mg  in setting of puss, edema, focal cellulitis; patient has had Keflex for for dental procedures with no adverse reaction -Advised patient to refrain from using knit toe spacer and to refrain from using ill fitting shoes -Recommended patient to refrain from swimming until  ulceration heals -Patient to return in 2 weeks for follow up evaluation/ulcer care or sooner; Advised pAdvised patient to call office immediately or go to the ER if symptoms worsen, will consider x-rays at next visit if ulcer fails to improve.   Landis Martins, DPM

## 2015-03-11 ENCOUNTER — Ambulatory Visit (INDEPENDENT_AMBULATORY_CARE_PROVIDER_SITE_OTHER): Payer: PPO | Admitting: Sports Medicine

## 2015-03-11 ENCOUNTER — Encounter: Payer: Self-pay | Admitting: Sports Medicine

## 2015-03-11 DIAGNOSIS — L03031 Cellulitis of right toe: Secondary | ICD-10-CM | POA: Diagnosis not present

## 2015-03-11 DIAGNOSIS — M79676 Pain in unspecified toe(s): Secondary | ICD-10-CM

## 2015-03-11 DIAGNOSIS — L97511 Non-pressure chronic ulcer of other part of right foot limited to breakdown of skin: Secondary | ICD-10-CM

## 2015-03-11 DIAGNOSIS — L02611 Cutaneous abscess of right foot: Secondary | ICD-10-CM

## 2015-03-11 NOTE — Progress Notes (Signed)
Patient ID: Courtney Lynn, female   DOB: [birth year], 75 y.o.   MRN: [id number]  Subjective: Courtney Lynn is a 75 y.o. female patient seen today in office for follow-up evaluation of right fourth toe ulceration. Patient has been dressing the area daily with Neosporin and has been taking Keflex with no problems. Patient has no other pedal complaints at this time.   Patient Active Problem List   Diagnosis Date Noted  . 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 Hypertension 07/01/2014  . Hyponatremia 07/01/2014   Current Outpatient Prescriptions on File Prior to Visit  Medication Sig Dispense Refill  . amLODipine (NORVASC) 10 MG tablet Take 10 mg by mouth every evening.     . cephALEXin (KEFLEX) 500 MG capsule Take 1 capsule (500 mg total) by mouth 3 (three) times daily. 30 capsule 0  . ketoconazole (NIZORAL) 2 % cream Apply 1 application topically daily. 15 g 0  . meloxicam (MOBIC) 15 MG tablet Take 15 mg by mouth daily. Pt takes 1/2 tablet daily    . omeprazole (PRILOSEC) 20 MG capsule Take 20 mg by mouth 2 (two) times daily.      No current facility-administered medications on file prior to visit.   Allergies  Allergen Reactions  . Cleocin [Clindamycin Hcl] Swelling, Rash and Other (See Comments)    Tongue, lip swelling Tongue, lip swelling Tongue, lip swelling  . Pen Vk [Penicillin V] Swelling, Other (See Comments) and Rash    Tongue, lip swelling Funny feeling on the tongue     Objective: Physical Exam  General: Well developed, nourished, no acute distress, awake, alert and oriented x 3  Vascular: Dorsalis pedis artery 2/4 bilateral, Posterior tibial artery 1/4 bilateral, skin temperature warm to warm proximal to distal bilateral lower extremities, no varicosities, pedal hair present bilateral.  Neurological: Gross sensation present via light touch bilateral.   Dermatological: Skin is warm, dry, and supple bilateral, Nails are short with mild subungal debris, no interdigital maceration or scaly  skin. To Right 4th Toe, dorsal aspect over the proximal interphalangeal joint there is a partial thickness ulcer present measures 0.3x02cm (last measurement 1.5x1cm) granular base, minimal surrounding keratosis; does not probe with decreased cellulitis focal to site and decreased edema.  Musculoskeletal: + bunion and hammertoe deformities noted on right. Muscular strength within normal limits without pain or limitation on range of motion. No pain with calf compression bilateral.  Assessment and Plan:  Problem List Items Addressed This Visit    None    Visit Diagnoses    Toe ulcer, right, limited to breakdown of skin (HCC)    -  Primary    Cellulitis and abscess of toe, right        resloved    Pain of toe, unspecified laterality          -Examined patient.  -Discussed treatment options for right 4th toe ulcer in the setting of hammertoe deformity -Patient instructed to continue with daily dressing with antibiotic cream and Band-Aid -Advised patient to refrain from using knit toe spacer and to refrain from using ill fitting shoes -Recommended patient to refrain from swimming until ulceration heals -Patient to return in 3 weeks for follow up evaluation/ulcer care or sooner; Advised patient to call office immediately or go to the ER if symptoms worsen, will consider x-rays at next visit if ulcer fails to improve. If resolved will give patient silicone toe cover at next encounter and encourage surgical management for long term preventive measures.   Landis Martins, DPM

## 2015-03-18 DIAGNOSIS — R7303 Prediabetes: Secondary | ICD-10-CM | POA: Diagnosis not present

## 2015-03-18 DIAGNOSIS — I10 Essential (primary) hypertension: Secondary | ICD-10-CM | POA: Diagnosis not present

## 2015-04-01 ENCOUNTER — Ambulatory Visit (INDEPENDENT_AMBULATORY_CARE_PROVIDER_SITE_OTHER): Payer: PPO

## 2015-04-01 ENCOUNTER — Encounter: Payer: Self-pay | Admitting: Sports Medicine

## 2015-04-01 ENCOUNTER — Ambulatory Visit (INDEPENDENT_AMBULATORY_CARE_PROVIDER_SITE_OTHER): Payer: PPO | Admitting: Sports Medicine

## 2015-04-01 DIAGNOSIS — Z0000 Encounter for general adult medical examination without abnormal findings: Secondary | ICD-10-CM | POA: Diagnosis not present

## 2015-04-01 DIAGNOSIS — Z23 Encounter for immunization: Secondary | ICD-10-CM | POA: Diagnosis not present

## 2015-04-01 DIAGNOSIS — L97511 Non-pressure chronic ulcer of other part of right foot limited to breakdown of skin: Secondary | ICD-10-CM

## 2015-04-01 DIAGNOSIS — Z124 Encounter for screening for malignant neoplasm of cervix: Secondary | ICD-10-CM | POA: Diagnosis not present

## 2015-04-01 DIAGNOSIS — M79671 Pain in right foot: Secondary | ICD-10-CM | POA: Diagnosis not present

## 2015-04-01 NOTE — Progress Notes (Signed)
Patient ID: Courtney Lynn, female   DOB: Feb 16, 1940, 75 y.o.   MRN: [id number] Subjective: Courtney Lynn is a 75 y.o. female patient seen today in office for follow-up evaluation of right fourth toe ulceration. Patient has been protecting the area with bandaid since it healed. Patient states that she is doing well with no problems; denies constitutional symptoms. Patient has no other pedal complaints at this time.   Patient Active Problem List   Diagnosis Date Noted  . 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 Hypertension 07/01/2014  . Hyponatremia 07/01/2014   Current Outpatient Prescriptions on File Prior to Visit  Medication Sig Dispense Refill  . amLODipine (NORVASC) 10 MG tablet Take 10 mg by mouth every evening.     Marland Kitchen ketoconazole (NIZORAL) 2 % cream Apply 1 application topically daily. 15 g 0  . meloxicam (MOBIC) 15 MG tablet Take 15 mg by mouth daily. Pt takes 1/2 tablet daily    . omeprazole (PRILOSEC) 20 MG capsule Take 20 mg by mouth 2 (two) times daily.      No current facility-administered medications on file prior to visit.   Allergies  Allergen Reactions  . Cleocin [Clindamycin Hcl] Swelling, Rash and Other (See Comments)    Tongue, lip swelling Tongue, lip swelling Tongue, lip swelling  . Pen Vk [Penicillin V] Swelling, Other (See Comments) and Rash    Tongue, lip swelling Funny feeling on the tongue   Objective: Physical Exam  General: Well developed, nourished, no acute distress, awake, alert and oriented x 3  Vascular: Dorsalis pedis artery 2/4 bilateral, Posterior tibial artery 1/4 bilateral, skin temperature warm to warm proximal to distal bilateral lower extremities, no varicosities, pedal hair present bilateral.  Neurological: Gross sensation present via light touch bilateral.   Dermatological: Skin is warm, dry, and supple bilateral, Nails are short with mild subungal debris, no interdigital maceration or scaly skin. To Right 4th Toe, healed ulcer NOW with minimal  keratosis;no signs of infection and decreased edema.  Musculoskeletal: + bunion and hammertoe deformities noted on right. Muscular strength within normal limits without pain or limitation on range of motion. No pain with calf compression bilateral.  Xray, Right foot: Normal osseous mineralization, no bony destruction or signs of osteomyelitis, Significant bunion and hammertoe deformity, no other immediate acute findings.  Assessment and Plan:  Problem List Items Addressed This Visit    None    Visit Diagnoses    Right foot pain    -  Primary    Relevant Orders    DG Foot Complete Right    Toe ulcer, right, limited to breakdown of skin ([place])        Now resolved      -Examined patient -X-rays reviewed -Discussed long term care since ulceration is now resolved -Recommend Band-Aid or Silicone toe protector to right 4th toe daily -Advised patient to refrain from using knit toe spacer and to refrain from using ill fitting shoes -May resume swimming since ulceration is healed -Patient does not want surgery on hammertoes. Patient to return next month as scheduled for routine nail care or sooner if iusses arise.  Landis Martins, DPM

## 2015-04-14 DIAGNOSIS — G4733 Obstructive sleep apnea (adult) (pediatric): Secondary | ICD-10-CM | POA: Insufficient documentation

## 2015-04-14 DIAGNOSIS — Z9989 Dependence on other enabling machines and devices: Secondary | ICD-10-CM | POA: Diagnosis not present

## 2015-04-14 DIAGNOSIS — I10 Essential (primary) hypertension: Secondary | ICD-10-CM | POA: Diagnosis not present

## 2015-04-16 DIAGNOSIS — Z0000 Encounter for general adult medical examination without abnormal findings: Secondary | ICD-10-CM | POA: Diagnosis not present

## 2015-04-18 DIAGNOSIS — G4733 Obstructive sleep apnea (adult) (pediatric): Secondary | ICD-10-CM | POA: Diagnosis not present

## 2015-04-21 ENCOUNTER — Ambulatory Visit (INDEPENDENT_AMBULATORY_CARE_PROVIDER_SITE_OTHER): Payer: PPO | Admitting: Sports Medicine

## 2015-04-21 ENCOUNTER — Encounter: Payer: Self-pay | Admitting: Sports Medicine

## 2015-04-21 DIAGNOSIS — L84 Corns and callosities: Secondary | ICD-10-CM | POA: Diagnosis not present

## 2015-04-21 DIAGNOSIS — M79671 Pain in right foot: Secondary | ICD-10-CM

## 2015-04-21 NOTE — Progress Notes (Signed)
Patient ID: Courtney Lynn, female   DOB: 1940-09-09, 75 y.o.   MRN: [id number]  Subjective: Courtney Lynn is a 75 y.o. female patient seen today in office for follow-up evaluation of right fourth toe ulceration states that she was doing good with wearing her toe cover but then on Friday started to notice the toe being more painful and irritated; patient was concerned for possible infection; denies constitutional symptoms. Patient has no other pedal complaints at this time.   Patient Active Problem List   Diagnosis Date Noted  . Obstructive apnea 04/14/2015  . 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 Pulmonary hypertension ([place]) 09/10/2014  . 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 Beat, premature ventricular 09/10/2014  . 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 Hypertension 07/01/2014  . Hyponatremia 07/01/2014  . Hypo-osmolality and hyponatremia 07/01/2014  . Chronic anemia 04/23/2014  . Adult BMI 30+ 03/28/2014  . 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 Clinical depression 03/28/2014  . Essential (primary) hypertension 03/28/2014  . Gastro-esophageal reflux disease without esophagitis 03/28/2014  . 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 Acute bronchitis, viral 01/30/2014   Current Outpatient Prescriptions on File Prior to Visit  Medication Sig Dispense Refill  . amLODipine (NORVASC) 10 MG tablet Take 10 mg by mouth every evening.     . citalopram (CELEXA) 40 MG tablet Take by mouth.    Marland Kitchen ketoconazole (NIZORAL) 2 % cream Apply 1 application topically daily. 15 g 0  . losartan (COZAAR) 100 MG tablet Take by mouth.    . meloxicam (MOBIC) 15 MG tablet Take 15 mg by mouth daily. Pt takes 1/2 tablet daily    . metoprolol succinate (TOPROL-XL) 25 MG 24 hr tablet Take by mouth.    Marland Kitchen omeprazole (PRILOSEC) 20 MG capsule Take 20 mg by mouth 2 (two) times daily.     . traMADol (ULTRAM) 50 MG tablet Take by mouth.     No current facility-administered medications on  file prior to visit.   Allergies  Allergen Reactions  . Cleocin [Clindamycin Hcl] Swelling, Rash and Other (See Comments)    Tongue, lip swelling Tongue, lip swelling Tongue, lip swelling  . Pen Vk [Penicillin V] Swelling, Other (See Comments) and Rash    Tongue, lip swelling Funny feeling on the tongue   Objective: Physical Exam  General: Well developed, nourished, no acute distress, awake, alert and oriented x 3  Vascular: Dorsalis pedis artery 2/4 bilateral, Posterior tibial artery 1/4 bilateral, skin temperature warm to warm proximal to distal bilateral lower extremities, no varicosities, pedal hair present bilateral.  Neurological: Gross sensation present via light touch bilateral.   Dermatological: Skin is warm, dry, and supple bilateral, Nails are short with mild subungal debris, no interdigital maceration or scaly skin. To Right 4th Toe, remains prematurely healed with minimal keratosis;no signs of infection and decreased edema.  Musculoskeletal: + bunion and hammertoe deformities noted on right. Muscular strength within normal limits without pain or limitation on range of motion. No pain with calf compression bilateral.  Assessment and Plan:  Problem List Items Addressed This Visit    None    Visit Diagnoses    Pre-ulcerative corn or callous    -  Primary    right 4th toe dorsal PIPJ    Right foot pain          -Examined patient -Discussed long term care; encouraged patient to consider arthroplasty if continue to be problematic -Parred keratotic lesion at dorsal right 4th PIPJ without  incident -Recommend Band-Aid with neosporin for preventive measures and close monitoring to right 4th toe daily -Advised patient to refrain from using toe protector, knit toe spacer and to refrain from using ill fitting shoes -May soak with epsom salt and take tylenol as needed for pain -Patient to return 2 weeks as scheduled for routine nail care or sooner if issues arise.  Landis Martins, DPM

## 2015-05-08 DIAGNOSIS — I340 Nonrheumatic mitral (valve) insufficiency: Secondary | ICD-10-CM | POA: Diagnosis not present

## 2015-05-08 DIAGNOSIS — R079 Chest pain, unspecified: Secondary | ICD-10-CM | POA: Diagnosis not present

## 2015-05-08 DIAGNOSIS — Z9989 Dependence on other enabling machines and devices: Secondary | ICD-10-CM | POA: Diagnosis not present

## 2015-05-08 DIAGNOSIS — I341 Nonrheumatic mitral (valve) prolapse: Secondary | ICD-10-CM | POA: Diagnosis not present

## 2015-05-08 DIAGNOSIS — G4733 Obstructive sleep apnea (adult) (pediatric): Secondary | ICD-10-CM | POA: Diagnosis not present

## 2015-05-08 DIAGNOSIS — R001 Bradycardia, unspecified: Secondary | ICD-10-CM | POA: Diagnosis not present

## 2015-05-08 DIAGNOSIS — I4891 Unspecified atrial fibrillation: Secondary | ICD-10-CM | POA: Diagnosis not present

## 2015-05-08 DIAGNOSIS — I493 Ventricular premature depolarization: Secondary | ICD-10-CM | POA: Diagnosis not present

## 2015-05-08 DIAGNOSIS — I10 Essential (primary) hypertension: Secondary | ICD-10-CM | POA: Diagnosis not present

## 2015-05-08 DIAGNOSIS — R42 Dizziness and giddiness: Secondary | ICD-10-CM | POA: Diagnosis not present

## 2015-05-09 ENCOUNTER — Encounter: Payer: Self-pay | Admitting: Sports Medicine

## 2015-05-09 ENCOUNTER — Ambulatory Visit (INDEPENDENT_AMBULATORY_CARE_PROVIDER_SITE_OTHER): Payer: PPO | Admitting: Sports Medicine

## 2015-05-09 DIAGNOSIS — L309 Dermatitis, unspecified: Secondary | ICD-10-CM | POA: Diagnosis not present

## 2015-05-09 DIAGNOSIS — L84 Corns and callosities: Secondary | ICD-10-CM

## 2015-05-09 DIAGNOSIS — M79671 Pain in right foot: Secondary | ICD-10-CM

## 2015-05-09 DIAGNOSIS — M2040 Other hammer toe(s) (acquired), unspecified foot: Secondary | ICD-10-CM | POA: Diagnosis not present

## 2015-05-09 NOTE — Progress Notes (Signed)
Patient ID: Tyshea Thelen, female   DOB: 1940/07/31, 75 y.o.   MRN: [id number]  Subjective: Rhegan Cangemi is a 75 y.o. female patient seen today in office for follow-up evaluation of right fourth toe pre-ulcerative callus states that she is doing good. No recurrence of opening, has been protecting the area with a bandaid. Denies any constitutional symptoms. Patient has no other pedal complaints at this time.   Patient Active Problem List   Diagnosis Date Noted  . Obstructive apnea 04/14/2015  . 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 Pulmonary hypertension ([place]) 09/10/2014  . 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 Beat, premature ventricular 09/10/2014  . Secondary pulmonary hypertension ([place]) 09/10/2014  . Premature complex, ventricular 09/10/2014  . 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 Hypertension 07/01/2014  . Hyponatremia 07/01/2014  . Hypo-osmolality and hyponatremia 07/01/2014  . Chronic anemia 04/23/2014  . Absolute anemia 04/23/2014  . Adult BMI 30+ 03/28/2014  . 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 Clinical depression 03/28/2014  . Essential (primary) hypertension 03/28/2014  . Gastro-esophageal reflux disease without esophagitis 03/28/2014  . 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 Cannot sleep 03/28/2014  . 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 Adiposity 03/28/2014  . Acute bronchitis, viral 01/30/2014   Current Outpatient Prescriptions on File Prior to Visit  Medication Sig Dispense Refill  . amLODipine (NORVASC) 10 MG tablet Take 10 mg by mouth every evening.     . citalopram (CELEXA) 40 MG tablet Take by mouth.    Marland Kitchen ketoconazole (NIZORAL) 2 % cream Apply 1 application topically daily. 15 g 0  . losartan (COZAAR) 100 MG tablet Take by mouth.    . meloxicam (MOBIC) 15 MG tablet Take 15 mg by mouth daily. Pt takes 1/2 tablet daily    . metoprolol succinate (TOPROL-XL) 25 MG 24 hr tablet Take by mouth.    Marland Kitchen  omeprazole (PRILOSEC) 20 MG capsule Take 20 mg by mouth 2 (two) times daily.     Marland Kitchen PREVNAR 13 SUSP injection INJ 0.5 ML INTO THE MUSCLE ONCE  0  . traMADol (ULTRAM) 50 MG tablet Take by mouth.     No current facility-administered medications on file prior to visit.   Allergies  Allergen Reactions  . Cleocin [Clindamycin Hcl] Swelling, Rash and Other (See Comments)    Tongue, lip swelling Tongue, lip swelling Tongue, lip swelling  . Pen Vk [Penicillin V] Swelling, Other (See Comments) and Rash    Tongue, lip swelling Funny feeling on the tongue   Objective: Physical Exam  General: Well developed, nourished, no acute distress, awake, alert and oriented x 3  Vascular: Dorsalis pedis artery 2/4 bilateral, Posterior tibial artery 1/4 bilateral, skin temperature warm to warm proximal to distal bilateral lower extremities, no varicosities, pedal hair present bilateral.  Neurological: Gross sensation present via light touch bilateral.   Dermatological: Skin is warm, dry, and supple bilateral, Nails are short with mild subungal debris, no interdigital maceration or scaly skin. To Right 4th Toe, minimal keratosis with no opening and no signs of infection. + small papules proximal to right 4th toe that is likely from bandaid applications causing irritation to the skin with no infection.   Musculoskeletal: + bunion and hammertoe deformities noted on right. Muscular strength within normal limits without pain or limitation on range of motion. No pain with calf compression bilateral.  Assessment and Plan:  Problem List Items Addressed This Visit    None    Visit  Diagnoses    Pre-ulcerative corn or callous    -  Primary    Right foot pain        Hammer toe, unspecified laterality        Dermatitis           -Examined patient -Discussed long term care; encouraged patient to consider arthroplasty if continue to be problematic -Recommend to d/c Band-Aid with neosporin and to apply cortisone  cream at area of irritation and to use silicone toe cap as dispensed for preventive measures and close monitoring to right 4th toe daily -Advised patient to refrain from using ill fitting shoes -May continue soaking with epsom salt and take tylenol as needed for pain -Patient to return 3 weeks as scheduled for routine nail care or sooner if issues arise.  Landis Martins, DPM

## 2015-05-15 DIAGNOSIS — I480 Paroxysmal atrial fibrillation: Secondary | ICD-10-CM | POA: Diagnosis not present

## 2015-05-26 DIAGNOSIS — R079 Chest pain, unspecified: Secondary | ICD-10-CM | POA: Diagnosis not present

## 2015-05-29 DIAGNOSIS — I341 Nonrheumatic mitral (valve) prolapse: Secondary | ICD-10-CM | POA: Diagnosis not present

## 2015-05-29 DIAGNOSIS — I493 Ventricular premature depolarization: Secondary | ICD-10-CM | POA: Diagnosis not present

## 2015-05-29 DIAGNOSIS — I272 Other secondary pulmonary hypertension: Secondary | ICD-10-CM | POA: Diagnosis not present

## 2015-05-29 DIAGNOSIS — Z9989 Dependence on other enabling machines and devices: Secondary | ICD-10-CM | POA: Diagnosis not present

## 2015-05-29 DIAGNOSIS — G4733 Obstructive sleep apnea (adult) (pediatric): Secondary | ICD-10-CM | POA: Diagnosis not present

## 2015-05-29 DIAGNOSIS — I10 Essential (primary) hypertension: Secondary | ICD-10-CM | POA: Diagnosis not present

## 2015-05-29 DIAGNOSIS — I340 Nonrheumatic mitral (valve) insufficiency: Secondary | ICD-10-CM | POA: Diagnosis not present

## 2015-05-29 DIAGNOSIS — R001 Bradycardia, unspecified: Secondary | ICD-10-CM | POA: Diagnosis not present

## 2015-05-30 ENCOUNTER — Encounter: Payer: Self-pay | Admitting: Sports Medicine

## 2015-05-30 ENCOUNTER — Ambulatory Visit (INDEPENDENT_AMBULATORY_CARE_PROVIDER_SITE_OTHER): Payer: PPO | Admitting: Sports Medicine

## 2015-05-30 DIAGNOSIS — L84 Corns and callosities: Secondary | ICD-10-CM | POA: Diagnosis not present

## 2015-05-30 DIAGNOSIS — M79676 Pain in unspecified toe(s): Secondary | ICD-10-CM | POA: Diagnosis not present

## 2015-05-30 DIAGNOSIS — M2040 Other hammer toe(s) (acquired), unspecified foot: Secondary | ICD-10-CM

## 2015-05-30 DIAGNOSIS — B351 Tinea unguium: Secondary | ICD-10-CM | POA: Diagnosis not present

## 2015-05-31 NOTE — Progress Notes (Signed)
Patient ID: Courtney Lynn, female   DOB: Oct 29, 1940, 75 y.o.   MRN: [id number]  Subjective: Courtney Lynn is a 75 y.o. female patient seen today in office with complaint of thickened and elongated toenails; unable to trim. Patient reports that the ulceration remains closed at the right 4th toe with minimal callus. Patient has no other pedal complaints at this time.   Patient Active Problem List   Diagnosis Date Noted  . Obstructive apnea 04/14/2015  . 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 Pulmonary hypertension ([place]) 09/10/2014  . 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 Beat, premature ventricular 09/10/2014  . Secondary pulmonary hypertension ([place]) 09/10/2014  . Premature complex, ventricular 09/10/2014  . 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 Hypertension 07/01/2014  . Hyponatremia 07/01/2014  . Hypo-osmolality and hyponatremia 07/01/2014  . Chronic anemia 04/23/2014  . Absolute anemia 04/23/2014  . Adult BMI 30+ 03/28/2014  . 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 Clinical depression 03/28/2014  . Essential (primary) hypertension 03/28/2014  . Gastro-esophageal reflux disease without esophagitis 03/28/2014  . 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 Cannot sleep 03/28/2014  . 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 Adiposity 03/28/2014  . Acute bronchitis, viral 01/30/2014   Current Outpatient Prescriptions on File Prior to Visit  Medication Sig Dispense Refill  . amLODipine (NORVASC) 10 MG tablet Take 10 mg by mouth every evening.     . citalopram (CELEXA) 40 MG tablet Take by mouth.    Marland Kitchen ketoconazole (NIZORAL) 2 % cream Apply 1 application topically daily. 15 g 0  . losartan (COZAAR) 100 MG tablet Take by mouth.    . meloxicam (MOBIC) 15 MG tablet Take 15 mg by mouth daily. Pt takes 1/2 tablet daily    . metoprolol succinate (TOPROL-XL) 25 MG 24 hr tablet Take by mouth.    Marland Kitchen omeprazole (PRILOSEC) 20 MG capsule Take 20  mg by mouth 2 (two) times daily.     Marland Kitchen PREVNAR 13 SUSP injection INJ 0.5 ML INTO THE MUSCLE ONCE  0  . traMADol (ULTRAM) 50 MG tablet Take by mouth.     No current facility-administered medications on file prior to visit.   Allergies  Allergen Reactions  . Cleocin [Clindamycin Hcl] Swelling, Rash and Other (See Comments)    Tongue, lip swelling Tongue, lip swelling Tongue, lip swelling  . Pen Vk [Penicillin V] Swelling, Other (See Comments) and Rash    Tongue, lip swelling Funny feeling on the tongue     Objective: Physical Exam  General: Well developed, nourished, no acute distress, awake, alert and oriented x 3  Vascular: Dorsalis pedis artery 2/4 bilateral, Posterior tibial artery 1/4 bilateral, skin temperature warm to warm proximal to distal bilateral lower extremities, no varicosities, pedal hair present bilateral.  Neurological: Gross sensation present via light touch bilateral.   Dermatological: Skin is warm, dry, and supple bilateral, Nails 1-10 are tender, long, thick, and discolored with mild subungal debris, no webspace macerations present, no open lesions present bilateral, mild corns/hyperkeratotic tissue present right 4th toe at site of previous ulcer. No signs of infection bilateral.  Musculoskeletal: Asymptomatic bunion and hammertoe deformities noted on right>left. Muscular strength within normal limits without pain or limitation on range of motion. No pain with calf compression bilateral.  Assessment and Plan:  Problem List Items Addressed This Visit    None    Visit Diagnoses    Dermatophytosis of nail    -  Primary  Pain of toe, unspecified laterality        Pre-ulcerative corn or callous        right 4th    Hammer toe, unspecified laterality          -Examined patient.  -Discussed treatment options for painful mycotic nails. -Mechanically debrided and reduced mycotic nails with sterile nail nipper and dremel nail file without incident. -Patient to  return in 3 months for follow up evaluation or sooner if symptoms worsen.  Landis Martins, DPM

## 2015-06-04 DIAGNOSIS — L821 Other seborrheic keratosis: Secondary | ICD-10-CM | POA: Diagnosis not present

## 2015-06-04 DIAGNOSIS — L814 Other melanin hyperpigmentation: Secondary | ICD-10-CM | POA: Diagnosis not present

## 2015-06-04 DIAGNOSIS — Z1283 Encounter for screening for malignant neoplasm of skin: Secondary | ICD-10-CM | POA: Diagnosis not present

## 2015-06-04 DIAGNOSIS — L739 Follicular disorder, unspecified: Secondary | ICD-10-CM | POA: Diagnosis not present

## 2015-06-04 DIAGNOSIS — L578 Other skin changes due to chronic exposure to nonionizing radiation: Secondary | ICD-10-CM | POA: Diagnosis not present

## 2015-06-04 DIAGNOSIS — D485 Neoplasm of uncertain behavior of skin: Secondary | ICD-10-CM | POA: Diagnosis not present

## 2015-06-04 DIAGNOSIS — L812 Freckles: Secondary | ICD-10-CM | POA: Diagnosis not present

## 2015-06-04 DIAGNOSIS — L820 Inflamed seborrheic keratosis: Secondary | ICD-10-CM | POA: Diagnosis not present

## 2015-06-04 DIAGNOSIS — D229 Melanocytic nevi, unspecified: Secondary | ICD-10-CM | POA: Diagnosis not present

## 2015-06-18 DIAGNOSIS — I10 Essential (primary) hypertension: Secondary | ICD-10-CM | POA: Diagnosis not present

## 2015-06-18 DIAGNOSIS — R0789 Other chest pain: Secondary | ICD-10-CM | POA: Diagnosis not present

## 2015-06-23 DIAGNOSIS — R0789 Other chest pain: Secondary | ICD-10-CM | POA: Diagnosis not present

## 2015-07-21 DIAGNOSIS — G4733 Obstructive sleep apnea (adult) (pediatric): Secondary | ICD-10-CM | POA: Diagnosis not present

## 2015-07-24 DIAGNOSIS — I340 Nonrheumatic mitral (valve) insufficiency: Secondary | ICD-10-CM | POA: Diagnosis not present

## 2015-07-24 DIAGNOSIS — I493 Ventricular premature depolarization: Secondary | ICD-10-CM | POA: Diagnosis not present

## 2015-07-24 DIAGNOSIS — I272 Other secondary pulmonary hypertension: Secondary | ICD-10-CM | POA: Diagnosis not present

## 2015-07-24 DIAGNOSIS — K649 Unspecified hemorrhoids: Secondary | ICD-10-CM | POA: Diagnosis not present

## 2015-07-24 DIAGNOSIS — I341 Nonrheumatic mitral (valve) prolapse: Secondary | ICD-10-CM | POA: Diagnosis not present

## 2015-07-24 DIAGNOSIS — I10 Essential (primary) hypertension: Secondary | ICD-10-CM | POA: Diagnosis not present

## 2015-08-12 ENCOUNTER — Other Ambulatory Visit: Payer: Self-pay | Admitting: Internal Medicine

## 2015-08-12 DIAGNOSIS — Z1231 Encounter for screening mammogram for malignant neoplasm of breast: Secondary | ICD-10-CM

## 2015-08-14 DIAGNOSIS — H2513 Age-related nuclear cataract, bilateral: Secondary | ICD-10-CM | POA: Diagnosis not present

## 2015-08-21 DIAGNOSIS — K625 Hemorrhage of anus and rectum: Secondary | ICD-10-CM | POA: Diagnosis not present

## 2015-08-21 DIAGNOSIS — K649 Unspecified hemorrhoids: Secondary | ICD-10-CM | POA: Diagnosis not present

## 2015-08-22 ENCOUNTER — Ambulatory Visit: Payer: PPO | Admitting: Sports Medicine

## 2015-08-29 ENCOUNTER — Ambulatory Visit: Payer: PPO | Admitting: Sports Medicine

## 2015-09-01 DIAGNOSIS — K648 Other hemorrhoids: Secondary | ICD-10-CM | POA: Diagnosis not present

## 2015-09-02 ENCOUNTER — Encounter: Payer: Self-pay | Admitting: Sports Medicine

## 2015-09-02 ENCOUNTER — Ambulatory Visit (INDEPENDENT_AMBULATORY_CARE_PROVIDER_SITE_OTHER): Payer: PPO | Admitting: Sports Medicine

## 2015-09-02 DIAGNOSIS — B351 Tinea unguium: Secondary | ICD-10-CM | POA: Diagnosis not present

## 2015-09-02 DIAGNOSIS — M2040 Other hammer toe(s) (acquired), unspecified foot: Secondary | ICD-10-CM

## 2015-09-02 DIAGNOSIS — M79676 Pain in unspecified toe(s): Secondary | ICD-10-CM

## 2015-09-02 DIAGNOSIS — L84 Corns and callosities: Secondary | ICD-10-CM

## 2015-09-02 NOTE — Progress Notes (Signed)
Patient ID: Courtney Lynn, female   DOB: 1940/11/25, 75 y.o.   MRN: [id number]  Subjective: Courtney Lynn is a 75 y.o. female patient seen today in office with complaint of thickened and elongated toenails; unable to trim. Patient reports that the ulceration remains closed at the right 4th toe with minimal callus; no acute problems; needs new toe cap. Patient has no other pedal complaints at this time.   Patient Active Problem List   Diagnosis Date Noted  . Obstructive apnea 04/14/2015  . 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 Pulmonary hypertension ([place]) 09/10/2014  . 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 Beat, premature ventricular 09/10/2014  . Secondary pulmonary hypertension ([place]) 09/10/2014  . Premature complex, ventricular 09/10/2014  . 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 Hypertension 07/01/2014  . Hyponatremia 07/01/2014  . Hypo-osmolality and hyponatremia 07/01/2014  . Chronic anemia 04/23/2014  . Absolute anemia 04/23/2014  . Adult BMI 30+ 03/28/2014  . 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 Clinical depression 03/28/2014  . Essential (primary) hypertension 03/28/2014  . Gastro-esophageal reflux disease without esophagitis 03/28/2014  . 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 Cannot sleep 03/28/2014  . 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 Adiposity 03/28/2014  . Acute bronchitis, viral 01/30/2014   Current Outpatient Prescriptions on File Prior to Visit  Medication Sig Dispense Refill  . amLODipine (NORVASC) 10 MG tablet Take 10 mg by mouth every evening.     . citalopram (CELEXA) 40 MG tablet Take by mouth.    Marland Kitchen ketoconazole (NIZORAL) 2 % cream Apply 1 application topically daily. 15 g 0  . losartan (COZAAR) 100 MG tablet Take by mouth.    . meloxicam (MOBIC) 15 MG tablet Take 15 mg by mouth daily. Pt takes 1/2 tablet daily    . metoprolol succinate (TOPROL-XL) 25 MG 24 hr tablet Take by mouth.    Marland Kitchen  omeprazole (PRILOSEC) 20 MG capsule Take 20 mg by mouth 2 (two) times daily.     Marland Kitchen PREVNAR 13 SUSP injection INJ 0.5 ML INTO THE MUSCLE ONCE  0  . traMADol (ULTRAM) 50 MG tablet Take by mouth.     No current facility-administered medications on file prior to visit.    Allergies  Allergen Reactions  . Cleocin [Clindamycin Hcl] Swelling, Rash and Other (See Comments)    Tongue, lip swelling Tongue, lip swelling Tongue, lip swelling  . Pen Vk [Penicillin V] Swelling, Other (See Comments) and Rash    Tongue, lip swelling Funny feeling on the tongue     Objective: Physical Exam  General: Well developed, nourished, no acute distress, awake, alert and oriented x 3  Vascular: Dorsalis pedis artery 2/4 bilateral, Posterior tibial artery 1/4 bilateral, skin temperature warm to warm proximal to distal bilateral lower extremities, no varicosities, pedal hair present bilateral.  Neurological: Gross sensation present via light touch bilateral.   Dermatological: Skin is warm, dry, and supple bilateral, Nails 1-10 are tender, long, thick, and discolored with mild subungal debris, no webspace macerations present, no open lesions present bilateral, minimal hyperkeratotic tissue present right 4th toe at site of previous ulcer. No signs of infection bilateral.  Musculoskeletal: Asymptomatic bunion and hammertoe deformities noted on right>left. Muscular strength within normal limits without pain or limitation on range of motion. No pain with calf compression bilateral.  Assessment and Plan:  Problem List Items Addressed This Visit    None    Visit Diagnoses    Dermatophytosis of nail    -  Primary   Pain of toe, unspecified laterality       Pre-ulcerative corn or callous       Hammer toe, unspecified laterality         -Examined patient.  -Discussed treatment options for painful mycotic nails. -Mechanically debrided and reduced mycotic nails with sterile nail nipper and dremel nail file  without incident. -Foam toe spacer and silicone cap given -Recommend patient to dry well in between toes and to use betadine as needed  -Patient to return in 3 months for follow up evaluation or sooner if symptoms worsen.  Landis Martins, DPM

## 2015-09-12 DIAGNOSIS — M79672 Pain in left foot: Secondary | ICD-10-CM | POA: Diagnosis not present

## 2015-09-12 DIAGNOSIS — M79671 Pain in right foot: Secondary | ICD-10-CM | POA: Diagnosis not present

## 2015-09-18 ENCOUNTER — Telehealth: Payer: Self-pay | Admitting: *Deleted

## 2015-09-18 ENCOUNTER — Ambulatory Visit: Payer: PPO

## 2015-09-18 ENCOUNTER — Ambulatory Visit (INDEPENDENT_AMBULATORY_CARE_PROVIDER_SITE_OTHER): Payer: PPO | Admitting: Podiatry

## 2015-09-18 ENCOUNTER — Encounter: Payer: Self-pay | Admitting: Podiatry

## 2015-09-18 ENCOUNTER — Ambulatory Visit (INDEPENDENT_AMBULATORY_CARE_PROVIDER_SITE_OTHER): Payer: PPO

## 2015-09-18 DIAGNOSIS — M19071 Primary osteoarthritis, right ankle and foot: Secondary | ICD-10-CM | POA: Diagnosis not present

## 2015-09-18 DIAGNOSIS — S92902A Unspecified fracture of left foot, initial encounter for closed fracture: Secondary | ICD-10-CM | POA: Diagnosis not present

## 2015-09-18 DIAGNOSIS — R52 Pain, unspecified: Secondary | ICD-10-CM | POA: Diagnosis not present

## 2015-09-18 NOTE — Progress Notes (Signed)
Subjective: 75 year old female presents the also concerns of left foot pain which is been ongoing for about 2 weeks. She did go to her primary care and she was given steroid should help some but she states that she soak in excruciating pain to her left foot and inability to put weight on her foot without pain. She denies any recent injury or trauma. She has some swelling to the area but denies any redness or warmth. Denies any systemic complaints such as fevers, chills, nausea, vomiting. No acute changes since last appointment, and no other complaints at this time.   Objective: AAO x3, NAD DP/PT pulses palpable bilaterally, CRT less than 3 seconds There is tenderness the fourth metatarsal base there is localized edema to this area without any erythema or increase in warmth. There is no other areas of pinpoint bony tenderness on left foot. There is mild tenderness on the medial band plantar fasciitis and foot. On the right foot there is generalized tenderness along the dorsal midfoot along Lisfranc joint. No specific area pinpoint bony tenderness or pain the vibratory sensation. No edema, erythema, increase in warmth to bilateral lower extremities.  No open lesions or pre-ulcerative lesions.  No pain with calf compression, swelling, warmth, erythema  Assessment: Left foot fourth metatarsal fracture  Plan: -All treatment options discussed with the patient including all alternatives, risks, complications.  -X-rays obtained and reviewed. There is evidence of fracture the fourth metatarsal base. -She was placed into a cam walker today. Hold off on exercising. She states that she has difficulty going the bathroom and she was multiple times to midnight. We'll order a bedside commode. Continue elevation and ice. -Follow-up in 3 weeks or sooner if any issues are to arise. -Patient encouraged to call the office with any questions, concerns, change in symptoms.   Celesta Gentile, DPM

## 2015-09-18 NOTE — Telephone Encounter (Addendum)
-----   Message from Trula Slade, DPM sent at 09/18/2015  1:08 PM EDT ----- I ordered a bedside commode for her if you could please follow-up with her. She is going out of town on Saturday and will be gone for 10 days if she could get it either today or Friday. Thank you! Faxed orders and pt demographics and clinicals to [place] [place].

## 2015-09-19 ENCOUNTER — Telehealth: Payer: Self-pay | Admitting: *Deleted

## 2015-09-19 NOTE — Telephone Encounter (Signed)
Pt asked how much activity she should have while in the boot. I told pt since she was going on a trip she should be in the boot at all times she is walking, taking in to consideration that an injured foot is more likely to swell and be uncomfortable when below the heart. Pt states understanding.

## 2015-10-09 ENCOUNTER — Ambulatory Visit: Payer: PRIVATE HEALTH INSURANCE

## 2015-10-09 ENCOUNTER — Ambulatory Visit: Payer: PPO | Admitting: Podiatry

## 2015-10-09 DIAGNOSIS — K648 Other hemorrhoids: Secondary | ICD-10-CM | POA: Diagnosis not present

## 2015-10-16 ENCOUNTER — Ambulatory Visit: Payer: PPO | Admitting: Podiatry

## 2015-10-23 ENCOUNTER — Ambulatory Visit
Admission: RE | Admit: 2015-10-23 | Discharge: 2015-10-23 | Disposition: A | Discharge status: Home / Self Care | Payer: PPO | Source: Ambulatory Visit | Attending: Internal Medicine | Admitting: Internal Medicine

## 2015-10-23 ENCOUNTER — Other Ambulatory Visit: Payer: Self-pay | Admitting: Internal Medicine

## 2015-10-23 ENCOUNTER — Ambulatory Visit: Payer: PRIVATE HEALTH INSURANCE

## 2015-10-23 DIAGNOSIS — Z1231 Encounter for screening mammogram for malignant neoplasm of breast: Secondary | ICD-10-CM

## 2015-10-23 DIAGNOSIS — N6489 Other specified disorders of breast: Secondary | ICD-10-CM | POA: Diagnosis not present

## 2015-10-24 ENCOUNTER — Ambulatory Visit (INDEPENDENT_AMBULATORY_CARE_PROVIDER_SITE_OTHER): Payer: PPO

## 2015-10-24 ENCOUNTER — Encounter: Payer: Self-pay | Admitting: Podiatry

## 2015-10-24 ENCOUNTER — Ambulatory Visit (INDEPENDENT_AMBULATORY_CARE_PROVIDER_SITE_OTHER): Payer: PPO | Admitting: Podiatry

## 2015-10-24 ENCOUNTER — Other Ambulatory Visit: Payer: Self-pay | Admitting: Internal Medicine

## 2015-10-24 DIAGNOSIS — N6489 Other specified disorders of breast: Secondary | ICD-10-CM

## 2015-10-24 DIAGNOSIS — S92902D Unspecified fracture of left foot, subsequent encounter for fracture with routine healing: Secondary | ICD-10-CM | POA: Diagnosis not present

## 2015-10-24 DIAGNOSIS — R52 Pain, unspecified: Secondary | ICD-10-CM

## 2015-10-26 DIAGNOSIS — S92902A Unspecified fracture of left foot, initial encounter for closed fracture: Secondary | ICD-10-CM | POA: Insufficient documentation

## 2015-10-26 HISTORY — DX: Unspecified fracture of left foot, initial encounter for closed fracture: S92.902A

## 2015-10-26 NOTE — Progress Notes (Signed)
Subjective: 75 year old female presents to the office they for follow-up evaluation of left foot fracture. She is continued surgical shoe. She denies any recent injury or trauma. Her pain has improved. Decrease swelling to her foot no redness or warmth. No other complaints at this time.  Objective: AAO x3, NAD DP/PT pulses palpable bilaterally, CRT less than 3 seconds There is decreased  tenderness the fourth metatarsal base there is decreased edema to this area without any erythema or increase in warmth. There are no other areas of pinpoint bony tenderness on left foot. There no tenderness on the medial band plantar fasciitis. No other areas of pinpoint bony tenderness or pain the vibratory sensation. No edema, erythema, increase in warmth to bilateral lower extremities.  No open lesions or pre-ulcerative lesions.  No pain with calf compression, swelling, warmth, erythema  Assessment: Left foot fourth metatarsal fracture  Plan: -All treatment options discussed with the patient including all alternatives, risks, complications.  -X-rays obtained and reviewed. There is evidence of fracture the fourth metatarsal base. Also question areas the third and fifth metatarsal base. The fracture the fourth metatarsal appears to be healing. -Continue with CAM boot for now. Continue elevation. Ice the area. -Follow-up with me as scheduled or sooner if needed. Call any questions or concerns meantime. *Nail trim next appointment  Celesta Gentile, DPM

## 2015-10-30 DIAGNOSIS — I10 Essential (primary) hypertension: Secondary | ICD-10-CM | POA: Diagnosis not present

## 2015-10-30 DIAGNOSIS — R7303 Prediabetes: Secondary | ICD-10-CM | POA: Diagnosis not present

## 2015-10-30 DIAGNOSIS — Z9989 Dependence on other enabling machines and devices: Secondary | ICD-10-CM | POA: Diagnosis not present

## 2015-10-30 DIAGNOSIS — Z23 Encounter for immunization: Secondary | ICD-10-CM | POA: Diagnosis not present

## 2015-10-30 DIAGNOSIS — K219 Gastro-esophageal reflux disease without esophagitis: Secondary | ICD-10-CM | POA: Diagnosis not present

## 2015-10-30 DIAGNOSIS — G4733 Obstructive sleep apnea (adult) (pediatric): Secondary | ICD-10-CM | POA: Diagnosis not present

## 2015-10-30 DIAGNOSIS — F325 Major depressive disorder, single episode, in full remission: Secondary | ICD-10-CM | POA: Diagnosis not present

## 2015-11-07 ENCOUNTER — Ambulatory Visit
Admission: RE | Admit: 2015-11-07 | Discharge: 2015-11-07 | Disposition: A | Discharge status: Home / Self Care | Payer: PPO | Source: Ambulatory Visit | Attending: Internal Medicine | Admitting: Internal Medicine

## 2015-11-07 DIAGNOSIS — N6489 Other specified disorders of breast: Secondary | ICD-10-CM

## 2015-11-07 DIAGNOSIS — N6310 Unspecified lump in the right breast, unspecified quadrant: Secondary | ICD-10-CM | POA: Diagnosis not present

## 2015-11-07 DIAGNOSIS — R928 Other abnormal and inconclusive findings on diagnostic imaging of breast: Secondary | ICD-10-CM | POA: Diagnosis not present

## 2015-11-07 DIAGNOSIS — G4733 Obstructive sleep apnea (adult) (pediatric): Secondary | ICD-10-CM | POA: Diagnosis not present

## 2015-11-07 DIAGNOSIS — N6313 Unspecified lump in the right breast, lower outer quadrant: Secondary | ICD-10-CM | POA: Diagnosis not present

## 2015-11-11 ENCOUNTER — Other Ambulatory Visit: Payer: Self-pay | Admitting: Internal Medicine

## 2015-11-11 DIAGNOSIS — N6310 Unspecified lump in the right breast, unspecified quadrant: Secondary | ICD-10-CM

## 2015-11-11 DIAGNOSIS — R928 Other abnormal and inconclusive findings on diagnostic imaging of breast: Secondary | ICD-10-CM

## 2015-11-12 DIAGNOSIS — K648 Other hemorrhoids: Secondary | ICD-10-CM | POA: Diagnosis not present

## 2015-11-13 ENCOUNTER — Ambulatory Visit (INDEPENDENT_AMBULATORY_CARE_PROVIDER_SITE_OTHER): Payer: PPO | Admitting: Podiatry

## 2015-11-13 ENCOUNTER — Ambulatory Visit (INDEPENDENT_AMBULATORY_CARE_PROVIDER_SITE_OTHER): Payer: PPO

## 2015-11-13 ENCOUNTER — Encounter: Payer: Self-pay | Admitting: Podiatry

## 2015-11-13 DIAGNOSIS — R52 Pain, unspecified: Secondary | ICD-10-CM

## 2015-11-13 DIAGNOSIS — M79676 Pain in unspecified toe(s): Secondary | ICD-10-CM | POA: Diagnosis not present

## 2015-11-13 DIAGNOSIS — B351 Tinea unguium: Secondary | ICD-10-CM | POA: Diagnosis not present

## 2015-11-13 DIAGNOSIS — S92902D Unspecified fracture of left foot, subsequent encounter for fracture with routine healing: Secondary | ICD-10-CM

## 2015-11-20 NOTE — Progress Notes (Signed)
Subjective: 75 year old female presents to the office they for follow-up evaluation of left foot fracture. She is continued surgical shoe. She states that her pain is improved and she is doing much better without any discomfort. She denies any swelling at this time. She also presents today for thick, painful, elongated toenails that she cannot trim herself. Denies any redness or drainage, and the toenail sites. No other complaints at this time and no recent injury to her foot.  Objective: AAO x3, NAD DP/PT pulses palpable bilaterally, CRT less than 3 seconds There is currently no tenderness the fourth metatarsal base there is minimal edema to this area without any erythema or increase in warmth. There are no other areas of pinpoint bony tenderness on left foot. There no tenderness on the medial band plantar fasciitis. No other areas of pinpoint bony tenderness or pain the vibratory sensation. Nails hypertrophic, dystrophic, brittle, discolored, elongated 10. No swelling redness or drainage. Tenderness nails 1-5 bilaterally. No edema, erythema, increase in warmth to bilateral lower extremities.  No open lesions or pre-ulcerative lesions.  No pain with calf compression, swelling, warmth, erythema  Assessment: Left foot fourth metatarsal fracture; symptomatic onychomycosis  Plan: -All treatment options discussed with the patient including all alternatives, risks, complications.  -X-rays obtained and reviewed. There is evidence of fracture the fourth metatarsal base. 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 -At this time as she is having no pain and swelling to the area will start to transition to a regular shoe. Discussed that if she has any increasing pain she is return to the CAM boot and call the office. She can start to gradually increase her activity as tolerated however there is pain to back off and call the office. -Nails debrided 10 without  complications or bleeding. -Follow-up with me as scheduled or sooner if needed. Call any questions or concerns meantime.  Celesta Gentile, DPM

## 2015-11-26 ENCOUNTER — Ambulatory Visit
Admission: RE | Admit: 2015-11-26 | Discharge: 2015-11-26 | Disposition: A | Discharge status: Home / Self Care | Payer: PPO | Source: Ambulatory Visit | Attending: Internal Medicine | Admitting: Internal Medicine

## 2015-11-26 ENCOUNTER — Encounter: Payer: Self-pay | Admitting: Oncology

## 2015-11-26 DIAGNOSIS — C50811 Malignant neoplasm of overlapping sites of right female breast: Secondary | ICD-10-CM | POA: Diagnosis not present

## 2015-11-26 DIAGNOSIS — N6310 Unspecified lump in the right breast, unspecified quadrant: Secondary | ICD-10-CM | POA: Insufficient documentation

## 2015-11-26 DIAGNOSIS — C50511 Malignant neoplasm of lower-outer quadrant of right female breast: Secondary | ICD-10-CM | POA: Diagnosis not present

## 2015-11-26 DIAGNOSIS — N6313 Unspecified lump in the right breast, lower outer quadrant: Secondary | ICD-10-CM | POA: Diagnosis not present

## 2015-11-26 DIAGNOSIS — R928 Other abnormal and inconclusive findings on diagnostic imaging of breast: Secondary | ICD-10-CM

## 2015-11-28 LAB — SURGICAL PATHOLOGY

## 2015-12-02 ENCOUNTER — Ambulatory Visit: Payer: PPO | Admitting: Oncology

## 2015-12-07 DIAGNOSIS — C50511 Malignant neoplasm of lower-outer quadrant of right female breast: Secondary | ICD-10-CM | POA: Insufficient documentation

## 2015-12-07 NOTE — Progress Notes (Signed)
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  INTERVAL HISTORY: Patient is a 75 year old female who was noted to have an abnormality on routine breast screening mammogram. Subsequent ultrasound biopsy revealed the above stated invasive carcinoma. She currently feels well and is asymptomatic. She has no neurological point. She denies any recent fevers or illnesses. She has a good appetite and denies weight loss. She has no chest pain or shortness of breath. She denies any nausea, vomiting, constipation, or diarrhea. She has no urinary complaints. Patient feels at her baseline and offers no specific complaints today.  REVIEW OF SYSTEMS:   Review of Systems  Constitutional: Negative.  Negative for fever, malaise/fatigue and weight loss.  Respiratory: Negative.  Negative for cough and shortness of breath.   Cardiovascular: Negative.  Negative for chest pain and leg swelling.  Gastrointestinal: Negative.  Negative for abdominal pain.  Genitourinary: Negative.   Musculoskeletal: Negative.   Neurological: Negative.  Negative for weakness.  Psychiatric/Behavioral: The patient is nervous/anxious.     As per HPI. Otherwise, a complete review of systems is negative.  PAST MEDICAL HISTORY: Past Medical History:  Diagnosis Date  . Anemia   . Arthritis   . Depression   . GERD (gastroesophageal reflux disease)   . Hypertension   . Irregular heart beat   . Reflux   . Sleep apnea     PAST SURGICAL HISTORY: Past Surgical History:  Procedure Laterality Date  . APPENDECTOMY    . BREAST BIOPSY Right 11/26/2015   path pending  . BREAST CYST ASPIRATION    . dental implants    . FOOT SURGERY     . GALLBLADDER SURGERY    . REPLACEMENT TOTAL KNEE Right     FAMILY HISTORY: Family History  Problem Relation Age of Onset  . GI Bleed Mother   . Diabetes Father   . Congestive Heart Failure Father   . Breast cancer Neg Hx     ADVANCED DIRECTIVES (Y/N):  N  HEALTH MAINTENANCE: Social History  Substance Use Topics  . Smoking status: Former Research scientist (life sciences)  . Smokeless tobacco: Never Used  . Alcohol use No     Colonoscopy:  PAP:  Bone density:  Lipid panel:  Allergies  Allergen Reactions  . Cleocin [Clindamycin Hcl] Swelling, Rash and Other (See Comments)    Tongue, lip swelling Tongue, lip swelling Tongue, lip swelling  . Pen Vk [Penicillin V] Swelling, Other (See Comments) and Rash    Tongue, lip swelling Funny feeling on the tongue    Current Outpatient Prescriptions  Medication Sig Dispense Refill  . amLODipine (NORVASC) 5 MG tablet Take 5 mg by mouth daily.    . cephALEXin (KEFLEX) 500 MG capsule Take 500 mg by mouth once. Prior to dental work.    . citalopram (CELEXA) 10 MG tablet Take 5 mg by mouth daily.    Marland Kitchen losartan (COZAAR) 100 MG tablet Take 100 mg by mouth daily.     . meloxicam (MOBIC) 15 MG tablet Take 15 mg by mouth daily.     . metoprolol succinate (TOPROL-XL) 25 MG 24 hr tablet Take 25 mg by mouth daily.    Marland Kitchen omeprazole (PRILOSEC) 20 MG capsule Take 20 mg by mouth daily.     Marland Kitchen  traMADol (ULTRAM) 50 MG tablet Take 50 mg by mouth every 6 (six) hours as needed.      No current facility-administered medications for this visit.     OBJECTIVE: Vitals:   12/09/15 1016  BP: (!) 133/59  Pulse: 60  Resp: 18  Temp: 98.3 F (36.8 C)     Body mass index is 33.84 kg/m.    ECOG FS:0 - Asymptomatic  General: Well-developed, well-nourished, no acute distress. Eyes: Pink conjunctiva, anicteric sclera. HEENT: Normocephalic, moist mucous membranes, clear oropharnyx. Breasts: Patient requested exam be deferred today. Lungs: Clear to auscultation  bilaterally. Heart: Regular rate and rhythm. No rubs, murmurs, or gallops. Abdomen: Soft, nontender, nondistended. No organomegaly noted, normoactive bowel sounds. Musculoskeletal: No edema, cyanosis, or clubbing. Neuro: Alert, answering all questions appropriately. Cranial nerves grossly intact. Skin: No rashes or petechiae noted. Psych: Normal affect. Lymphatics: No cervical, calvicular, axillary or inguinal LAD.   LAB RESULTS:  Lab Results  Component Value Date   NA 138 07/02/2014   K 3.7 07/02/2014   CL 105 07/02/2014   CO2 26 07/02/2014   GLUCOSE 96 07/02/2014   BUN 22 (H) 07/02/2014   CREATININE 0.80 07/02/2014   CALCIUM 9.2 07/02/2014   PROT 6.7 07/01/2014   ALBUMIN 3.8 07/01/2014   AST 36 07/01/2014   ALT 42 07/01/2014   ALKPHOS 57 07/01/2014   BILITOT 0.4 07/01/2014   GFRNONAA >60 07/02/2014   GFRAA >60 07/02/2014    Lab Results  Component Value Date   WBC 6.5 07/02/2014   NEUTROABS 5.0 07/01/2014   HGB 12.1 07/02/2014   HCT 36.9 07/02/2014   MCV 88.5 07/02/2014   PLT 316 07/02/2014     STUDIES: Dg Foot Complete Left  Result Date: 11/20/2015 See progress note  Mm Clip Placement Right  Result Date: 11/26/2015 CLINICAL DATA:  Post biopsy mammogram of the right breast for clip placement. EXAM: DIAGNOSTIC RIGHT MAMMOGRAM POST ULTRASOUND BIOPSY COMPARISON:  Previous exam(s). FINDINGS: Mammographic images were obtained following ultrasound guided biopsy of right breast mass at 8:30. The HydroMARK butterfly shaped biopsy marker is appropriately positioned at the site of biopsy at 8:30. IMPRESSION: Appropriate positioning of the HydroMARK butterfly shaped marker at the 8:30 o'clock position in the right breast. Final Assessment: Post Procedure Mammograms for Marker Placement Electronically Signed   By: Ammie Ferrier M.D.   On: 11/26/2015 13:57   [place] Rt Breast Bx W Loc Dev 1st Lesion Img Bx Spec US Guide  Addendum Date: 11/28/2015   ADDENDUM REPORT:  11/28/2015 15:46 ADDENDUM: Pathology of the right breast biopsy revealed A. RIGHT BREAST, 8:30; BIOPSY: INVASIVE MAMMARY CARCINOMA OF NO SPECIAL. TUMOR SIZE IN THIS CORE SAMPLE: 6 MM. PRELIMINARY GRADE: 2 (NOTTINGHAM HISTOLOGIC GRADE). Note: ER, PR and HER2-neu immunohistochemistry is obtained and results will be issued in an addendum. HER2 FISH will be performed for equivocal results. Final histologic grade should be based on the excised tumor. Results were called to Maudie Mercury in the Radiology Department at 2:41 PM on 11/27/15. This was found to be concordant by Dr. Theda Sers. Recommendation:  Surgical and oncology referral. Results and recommendations were relayed to Dr. Keturah Barre nurse Amy on 11/27/15 at 4:00 PM. A copy of the pathology report was faxed to their office. She stated Dr. Candiss Norse will be in the office on 11/28/15 and will contact the patient with results then. The patient was contacted by phone on 11/28/15 by Jetta Lout, [facility]) for a post biopsy check. She stated she has done well  with no bleeding, bruising, or hematoma. Post biopsy instructions were reviewed with the patient. All of her questions were answered. She was encouraged to call the [place] [facility] with any further questions or concerns. The patient stated she has been notified by Dr. Candiss Norse of the results. Addendum by Jetta Lout, RRA on 11/28/15. Electronically Signed   By: Ammie Ferrier M.D.   On: 11/28/2015 15:46   Result Date: 11/28/2015 CLINICAL DATA:  75 year old female presenting for ultrasound-guided biopsy of a right breast mass. EXAM: ULTRASOUND GUIDED RIGHT BREAST CORE NEEDLE BIOPSY COMPARISON:  Previous exam(s). FINDINGS: I met with the patient and we discussed the procedure of ultrasound-guided biopsy, including benefits and alternatives. We discussed the high likelihood of a successful procedure. We discussed the risks of the procedure, including infection, bleeding,  tissue injury, clip migration, and inadequate sampling. Informed written consent was given. The usual time-out protocol was performed immediately prior to the procedure. Using sterile technique and 1% Lidocaine as local anesthetic, under direct ultrasound visualization, a 14 gauge spring-loaded device was used to perform biopsy of mass in the right breast at 8:30 using a medial approach. At the conclusion of the procedure a HydroMARK butterfly shaped tissue marker clip was deployed into the biopsy cavity. Follow up 2 view mammogram was performed and dictated separately. IMPRESSION: Ultrasound guided biopsy of right breast mass at 8:30. No apparent complications. Electronically Signed: By: Ammie Ferrier M.D. 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 Will order mammoprint to assess if patient is either low risk or high risk. Although patient has already indicated that she likely would refuse any chemotherapy if recommended. Patient will require adjuvant XRT if she has lumpectomy. She will also benefit from an aromatase inhibitor for 5 years given the ER/PR status of her tumor. No further intervention is needed at this time. Follow-up 1-2 weeks after her lumpectomy to discuss the final pathology results and additional treatment planning.  Approximate 45 minutes was spent in discussion of which came 50% was consultation.  Patient expressed understanding and was in agreement with this plan. She also understands that She can call clinic at any time with any questions, concerns, or complaints.   Primary cancer of lower outer  quadrant of right female breast [facility])   Staging form: Breast, AJCC 7th Edition   - Clinical stage from 12/07/2015: Stage IA (T1b, N0, M0) - Signed by Lloyd Huger, MD on 12/07/2015  Lloyd Huger, MD   12/10/2015 4:41 PM

## 2015-12-09 ENCOUNTER — Inpatient Hospital Stay: Payer: PPO | Attending: Oncology | Admitting: Oncology

## 2015-12-09 ENCOUNTER — Ambulatory Visit: Payer: PPO | Admitting: Sports Medicine

## 2015-12-09 ENCOUNTER — Encounter (INDEPENDENT_AMBULATORY_CARE_PROVIDER_SITE_OTHER): Payer: Self-pay

## 2015-12-09 DIAGNOSIS — I10 Essential (primary) hypertension: Secondary | ICD-10-CM | POA: Diagnosis not present

## 2015-12-09 DIAGNOSIS — K219 Gastro-esophageal reflux disease without esophagitis: Secondary | ICD-10-CM | POA: Diagnosis not present

## 2015-12-09 DIAGNOSIS — Z79899 Other long term (current) drug therapy: Secondary | ICD-10-CM | POA: Insufficient documentation

## 2015-12-09 DIAGNOSIS — G4730 Sleep apnea, unspecified: Secondary | ICD-10-CM | POA: Insufficient documentation

## 2015-12-09 DIAGNOSIS — C50511 Malignant neoplasm of lower-outer quadrant of right female breast: Secondary | ICD-10-CM | POA: Diagnosis not present

## 2015-12-09 DIAGNOSIS — F329 Major depressive disorder, single episode, unspecified: Secondary | ICD-10-CM | POA: Diagnosis not present

## 2015-12-09 DIAGNOSIS — Z9049 Acquired absence of other specified parts of digestive tract: Secondary | ICD-10-CM | POA: Diagnosis not present

## 2015-12-09 DIAGNOSIS — I499 Cardiac arrhythmia, unspecified: Secondary | ICD-10-CM | POA: Diagnosis not present

## 2015-12-09 DIAGNOSIS — M129 Arthropathy, unspecified: Secondary | ICD-10-CM | POA: Insufficient documentation

## 2015-12-09 DIAGNOSIS — Z170 Estrogen receptor positive status [ER+]: Secondary | ICD-10-CM | POA: Insufficient documentation

## 2015-12-09 DIAGNOSIS — Z87891 Personal history of nicotine dependence: Secondary | ICD-10-CM

## 2015-12-09 NOTE — Progress Notes (Signed)
  Oncology Nurse Navigator Documentation  Navigator Location: [facility]-Med Onc (12/09/15 1200) Referral date to RadOnc/MedOnc: 12/09/15 (12/09/15 1200) )Navigator Encounter Type: Initial MedOnc (12/09/15 1200)   Abnormal Finding Date: 11/07/15 (12/09/15 1200) Confirmed Diagnosis Date: 11/26/15 (12/09/15 1200)         Multidisiplinary Clinic Date: 12/09/15 (12/09/15 1200) Multidisiplinary Clinic Type: Breast (12/09/15 1200)   Patient Visit Type: MedOnc (12/09/15 1200)   Barriers/Navigation Needs:  (Limited support) (12/09/15 1200)   Interventions: Coordination of Care;Education (12/09/15 1200)     Education Method: Verbal;Written;Teach-back (12/09/15 1200)  Support Groups/Services: Breast Support Group (12/09/15 1200)             Time Spent with Patient: 75 (12/09/15 1200)  Met with patient at initial [place] visit.  She is a retired Marine scientist.  Has no children.  States "I nursed both of my husbands through cancer".  [facility], and is very active.  Discussed plans for treatment based on surgicall pathology.  Appointment with Dr. Tamala Julian today following this appointment.  Did not want Breast Cancer Treatment Handbook, but received information on hospital services.

## 2015-12-09 NOTE — Progress Notes (Signed)
New evaluation for breast cancer. Offers no complaints. 

## 2015-12-10 ENCOUNTER — Other Ambulatory Visit: Payer: Self-pay | Admitting: Surgery

## 2015-12-10 DIAGNOSIS — Z170 Estrogen receptor positive status [ER+]: Principal | ICD-10-CM

## 2015-12-10 DIAGNOSIS — C50511 Malignant neoplasm of lower-outer quadrant of right female breast: Secondary | ICD-10-CM

## 2015-12-10 DIAGNOSIS — G4733 Obstructive sleep apnea (adult) (pediatric): Secondary | ICD-10-CM | POA: Diagnosis not present

## 2015-12-10 DIAGNOSIS — R42 Dizziness and giddiness: Secondary | ICD-10-CM | POA: Diagnosis not present

## 2015-12-10 DIAGNOSIS — I10 Essential (primary) hypertension: Secondary | ICD-10-CM | POA: Diagnosis not present

## 2015-12-10 DIAGNOSIS — Z9989 Dependence on other enabling machines and devices: Secondary | ICD-10-CM | POA: Diagnosis not present

## 2015-12-11 ENCOUNTER — Other Ambulatory Visit: Payer: Self-pay | Admitting: Surgery

## 2015-12-11 DIAGNOSIS — Z170 Estrogen receptor positive status [ER+]: Principal | ICD-10-CM

## 2015-12-11 DIAGNOSIS — C50411 Malignant neoplasm of upper-outer quadrant of right female breast: Secondary | ICD-10-CM

## 2015-12-13 DIAGNOSIS — R42 Dizziness and giddiness: Secondary | ICD-10-CM | POA: Insufficient documentation

## 2015-12-18 NOTE — Progress Notes (Signed)
  Oncology Nurse Navigator Documentation  Navigator Location: [facility]-Med Onc (12/18/15 1600)   )                      Patient Visit Type: Follow-up (12/18/15 1600)                              Time Spent with Patient: 30 (12/18/15 1600)  Patient scheduled for consult with Dr. Baruch Gouty 01/27/16 at 10:30 and follow-up appointment with Dr. Grayland Ormond on 01/27/16 at 11:30.  Reminder to be mailed.  Notified patient.

## 2015-12-22 ENCOUNTER — Encounter
Admission: RE | Admit: 2015-12-22 | Discharge: 2015-12-22 | Disposition: A | Discharge status: Home / Self Care | Payer: PPO | Source: Ambulatory Visit | Attending: Surgery | Admitting: Surgery

## 2015-12-22 DIAGNOSIS — Z01812 Encounter for preprocedural laboratory examination: Secondary | ICD-10-CM | POA: Insufficient documentation

## 2015-12-22 HISTORY — DX: Other complications of anesthesia, initial encounter: T88.59XA

## 2015-12-22 HISTORY — DX: Adverse effect of unspecified anesthetic, initial encounter: T41.45XA

## 2015-12-22 HISTORY — DX: Prediabetes: R73.03

## 2015-12-22 LAB — CBC
HEMATOCRIT: 35.9 % (ref 35.0–47.0)
HEMOGLOBIN: 12.5 g/dL (ref 12.0–16.0)
MCH: 30.7 pg (ref 26.0–34.0)
MCHC: 34.7 g/dL (ref 32.0–36.0)
MCV: 88.4 fL (ref 80.0–100.0)
Platelets: 285 10*3/uL (ref 150–440)
RBC: 4.06 MIL/uL (ref 3.80–5.20)
RDW: 13.9 % (ref 11.5–14.5)
WBC: 6.9 10*3/uL (ref 3.6–11.0)

## 2015-12-22 LAB — DIFFERENTIAL
BASOS ABS: 0.1 10*3/uL (ref 0–0.1)
Basophils Relative: 1 %
Eosinophils Absolute: 0.3 10*3/uL (ref 0–0.7)
Eosinophils Relative: 4 %
LYMPHS ABS: 1.6 10*3/uL (ref 1.0–3.6)
LYMPHS PCT: 23 %
MONOS PCT: 10 %
Monocytes Absolute: 0.7 10*3/uL (ref 0.2–0.9)
NEUTROS ABS: 4.3 10*3/uL (ref 1.4–6.5)
NEUTROS PCT: 62 %

## 2015-12-22 LAB — COMPREHENSIVE METABOLIC PANEL
ALBUMIN: 3.8 g/dL (ref 3.5–5.0)
ALT: 23 U/L (ref 14–54)
AST: 24 U/L (ref 15–41)
Alkaline Phosphatase: 71 U/L (ref 38–126)
Anion gap: 7 (ref 5–15)
BILIRUBIN TOTAL: 0.4 mg/dL (ref 0.3–1.2)
BUN: 27 mg/dL — AB (ref 6–20)
CO2: 27 mmol/L (ref 22–32)
CREATININE: 0.89 mg/dL (ref 0.44–1.00)
Calcium: 9.1 mg/dL (ref 8.9–10.3)
Chloride: 99 mmol/L — ABNORMAL LOW (ref 101–111)
GFR calc Af Amer: >60 mL/min (ref >60)
GLUCOSE: 92 mg/dL (ref 65–99)
Potassium: 4 mmol/L (ref 3.5–5.1)
Sodium: 133 mmol/L — ABNORMAL LOW (ref 135–145)
TOTAL PROTEIN: 6.7 g/dL (ref 6.5–8.1)

## 2015-12-22 NOTE — Patient Instructions (Signed)
Your procedure is scheduled on: Friday 01/02/16 Report to Day Surgery. 2ND FLOOR MEDICAL MALL ENTRANCE To find out your arrival time please call [phone] between 1PM - 3PM on Thursday 01/01/16.  Remember: Instructions that are not followed completely may result in serious medical risk, up to and including death, or upon the discretion of your surgeon and anesthesiologist your surgery may need to be rescheduled.    __X__ 1. Do not eat food or drink liquids after midnight. No gum chewing or hard candies.     __X__ 2. No Alcohol for 24 hours before or after surgery.   ____ 3. Bring all medications with you on the day of surgery if instructed.    __X__ 4. Notify your doctor if there is any change in your medical condition     (cold, fever, infections).     Do not wear jewelry, make-up, hairpins, clips or nail polish.  Do not wear lotions, powders, or perfumes.   Do not shave 48 hours prior to surgery. Men may shave face and neck.  Do not bring valuables to the hospital.    [facility] is not responsible for any belongings or valuables.               Contacts, dentures or bridgework may not be worn into surgery.  Leave your suitcase in the car. After surgery it may be brought to your room.  For patients admitted to the hospital, discharge time is determined by your                treatment team.   Patients discharged the day of surgery will not be allowed to drive home.   Please read over the following fact sheets that you were given:   Pain Booklet   __X__ Take these medicines the morning of surgery with A SIP OF WATER:    1. AMLODIPINE  2. METOPROLOL  3. OMEPRAZOLE  4.   5.  6.  ____ Fleet Enema (as directed)   __X__ Use CHG Soap as directed  ____ Use inhalers on the day of surgery  ____ Stop metformin 2 days prior to surgery    ____ Take 1/2 of usual insulin dose the night before surgery and none on the morning of surgery.   ____ Stop Coumadin/Plavix/aspirin on    __X__ Stop Anti-inflammatories on STOP IBUPROFEN AND MELOXICAM 7 DAYS PRIOR TO SURGERY   __X__ Stop supplements until after surgery. 7 DAYS PRIOR TO SURGERY   __X__ Bring C-Pap to the hospital.

## 2015-12-23 ENCOUNTER — Other Ambulatory Visit: Payer: PPO

## 2016-01-02 ENCOUNTER — Encounter: Payer: Self-pay | Admitting: *Deleted

## 2016-01-02 ENCOUNTER — Ambulatory Visit
Admission: RE | Admit: 2016-01-02 | Discharge: 2016-01-02 | Disposition: A | Discharge status: Home / Self Care | Payer: PPO | Source: Ambulatory Visit | Attending: Surgery | Admitting: Surgery

## 2016-01-02 ENCOUNTER — Ambulatory Visit: Payer: PPO | Admitting: Certified Registered"

## 2016-01-02 ENCOUNTER — Encounter
Admission: RE | Disposition: A | Discharge status: Home / Self Care | Payer: Self-pay | Source: Ambulatory Visit | Attending: Surgery

## 2016-01-02 DIAGNOSIS — I10 Essential (primary) hypertension: Secondary | ICD-10-CM | POA: Diagnosis not present

## 2016-01-02 DIAGNOSIS — K219 Gastro-esophageal reflux disease without esophagitis: Secondary | ICD-10-CM | POA: Insufficient documentation

## 2016-01-02 DIAGNOSIS — Z888 Allergy status to other drugs, medicaments and biological substances status: Secondary | ICD-10-CM | POA: Diagnosis not present

## 2016-01-02 DIAGNOSIS — Z170 Estrogen receptor positive status [ER+]: Principal | ICD-10-CM

## 2016-01-02 DIAGNOSIS — D630 Anemia in neoplastic disease: Secondary | ICD-10-CM | POA: Diagnosis not present

## 2016-01-02 DIAGNOSIS — C50511 Malignant neoplasm of lower-outer quadrant of right female breast: Secondary | ICD-10-CM

## 2016-01-02 DIAGNOSIS — Z96651 Presence of right artificial knee joint: Secondary | ICD-10-CM | POA: Insufficient documentation

## 2016-01-02 DIAGNOSIS — Z87891 Personal history of nicotine dependence: Secondary | ICD-10-CM | POA: Insufficient documentation

## 2016-01-02 DIAGNOSIS — Z880 Allergy status to penicillin: Secondary | ICD-10-CM | POA: Insufficient documentation

## 2016-01-02 DIAGNOSIS — G4730 Sleep apnea, unspecified: Secondary | ICD-10-CM | POA: Diagnosis not present

## 2016-01-02 DIAGNOSIS — C50919 Malignant neoplasm of unspecified site of unspecified female breast: Secondary | ICD-10-CM

## 2016-01-02 DIAGNOSIS — D649 Anemia, unspecified: Secondary | ICD-10-CM | POA: Diagnosis not present

## 2016-01-02 DIAGNOSIS — C50911 Malignant neoplasm of unspecified site of right female breast: Secondary | ICD-10-CM | POA: Diagnosis not present

## 2016-01-02 DIAGNOSIS — C50411 Malignant neoplasm of upper-outer quadrant of right female breast: Secondary | ICD-10-CM

## 2016-01-02 HISTORY — DX: Malignant neoplasm of unspecified site of unspecified female breast: C50.919

## 2016-01-02 HISTORY — PX: PARTIAL MASTECTOMY WITH NEEDLE LOCALIZATION: SHX6008

## 2016-01-02 HISTORY — PX: SENTINEL NODE BIOPSY: SHX6608

## 2016-01-02 HISTORY — PX: BREAST BIOPSY: SHX20

## 2016-01-02 SURGERY — PARTIAL MASTECTOMY WITH NEEDLE LOCALIZATION
Anesthesia: General | Site: Breast | Laterality: Right | Wound class: Clean

## 2016-01-02 MED ORDER — FENTANYL CITRATE (PF) 100 MCG/2ML IJ SOLN
25.0000 ug | INTRAMUSCULAR | Status: DC | PRN
  Administered 2016-01-02 (×2): 25 ug via INTRAVENOUS

## 2016-01-02 MED ORDER — TECHNETIUM TC 99M SULFUR COLLOID
1.0710 | Freq: Once | INTRAVENOUS | Status: AC | PRN
  Administered 2016-01-02: 1.071 via INTRAVENOUS

## 2016-01-02 MED ORDER — LABETALOL HCL 5 MG/ML IV SOLN
2.5000 mg | Freq: Once | INTRAVENOUS | Status: AC
  Administered 2016-01-02: 2.5 mg via INTRAVENOUS

## 2016-01-02 MED ORDER — EPHEDRINE SULFATE 50 MG/ML IJ SOLN
INTRAMUSCULAR | Status: DC | PRN
  Administered 2016-01-02 (×3): 5 mg via INTRAVENOUS

## 2016-01-02 MED ORDER — FAMOTIDINE 20 MG PO TABS
ORAL_TABLET | ORAL | Status: AC
  Filled 2016-01-02: qty 1

## 2016-01-02 MED ORDER — HYDROCODONE-ACETAMINOPHEN 5-325 MG PO TABS
1.0000 | ORAL_TABLET | ORAL | Status: DC | PRN
  Administered 2016-01-02: 1 via ORAL

## 2016-01-02 MED ORDER — BUPIVACAINE HCL (PF) 0.5 % IJ SOLN
INTRAMUSCULAR | Status: AC
  Filled 2016-01-02: qty 30

## 2016-01-02 MED ORDER — DEXAMETHASONE SODIUM PHOSPHATE 10 MG/ML IJ SOLN
INTRAMUSCULAR | Status: DC | PRN
  Administered 2016-01-02: 4 mg via INTRAVENOUS

## 2016-01-02 MED ORDER — HYDROCODONE-ACETAMINOPHEN 5-325 MG PO TABS
ORAL_TABLET | ORAL | Status: AC
  Filled 2016-01-02: qty 1

## 2016-01-02 MED ORDER — LIDOCAINE HCL (CARDIAC) 20 MG/ML IV SOLN
INTRAVENOUS | Status: DC | PRN
  Administered 2016-01-02: 100 mg via INTRAVENOUS

## 2016-01-02 MED ORDER — FENTANYL CITRATE (PF) 100 MCG/2ML IJ SOLN
INTRAMUSCULAR | Status: AC
  Filled 2016-01-02: qty 2

## 2016-01-02 MED ORDER — FAMOTIDINE 20 MG PO TABS: 20.0000 mg | ORAL_TABLET | Freq: Once | ORAL | Status: DC

## 2016-01-02 MED ORDER — ONDANSETRON HCL 4 MG/2ML IJ SOLN: 4.0000 mg | Freq: Once | INTRAMUSCULAR | Status: DC | PRN

## 2016-01-02 MED ORDER — EPINEPHRINE PF 1 MG/ML IJ SOLN
INTRAMUSCULAR | Status: AC
  Filled 2016-01-02: qty 2

## 2016-01-02 MED ORDER — LABETALOL HCL 5 MG/ML IV SOLN
INTRAVENOUS | Status: AC
  Filled 2016-01-02: qty 4

## 2016-01-02 MED ORDER — HYDROCODONE-ACETAMINOPHEN 5-325 MG PO TABS: 1.0000 | ORAL_TABLET | ORAL | 0 refills | Status: DC | PRN

## 2016-01-02 MED ORDER — LACTATED RINGERS IV SOLN
INTRAVENOUS | Status: DC
  Administered 2016-01-02: 11:00:00 via INTRAVENOUS

## 2016-01-02 MED ORDER — BUPIVACAINE-EPINEPHRINE 0.5% -1:200000 IJ SOLN
INTRAMUSCULAR | Status: DC | PRN
  Administered 2016-01-02: 25 mL

## 2016-01-02 MED ORDER — PROPOFOL 10 MG/ML IV BOLUS
INTRAVENOUS | Status: DC | PRN
  Administered 2016-01-02: 140 mg via INTRAVENOUS

## 2016-01-02 MED ORDER — FENTANYL CITRATE (PF) 100 MCG/2ML IJ SOLN
INTRAMUSCULAR | Status: DC | PRN
  Administered 2016-01-02 (×4): 25 ug via INTRAVENOUS

## 2016-01-02 MED ORDER — ONDANSETRON HCL 4 MG/2ML IJ SOLN
INTRAMUSCULAR | Status: DC | PRN
  Administered 2016-01-02: 4 mg via INTRAVENOUS

## 2016-01-02 SURGICAL SUPPLY — 33 items
BLADE SURG 15 STRL LF DISP TIS (BLADE) ×1 IMPLANT
BLADE SURG 15 STRL SS (BLADE) ×2
CANISTER SUCT 1200ML W/VALVE (MISCELLANEOUS) ×3 IMPLANT
CHLORAPREP W/TINT 26ML (MISCELLANEOUS) ×3 IMPLANT
CNTNR SPEC 2.5X3XGRAD LEK (MISCELLANEOUS) ×1
CONT SPEC 4OZ STER OR WHT (MISCELLANEOUS) ×2
CONTAINER SPEC 2.5X3XGRAD LEK (MISCELLANEOUS) ×1 IMPLANT
COVER LIGHT HANDLE STERIS (MISCELLANEOUS) ×3 IMPLANT
DERMABOND ADVANCED (GAUZE/BANDAGES/DRESSINGS) ×2
DERMABOND ADVANCED .7 DNX12 (GAUZE/BANDAGES/DRESSINGS) ×1 IMPLANT
DEVICE DUBIN SPECIMEN MAMMOGRA (MISCELLANEOUS) ×3 IMPLANT
DRAPE LAPAROTOMY 77X122 PED (DRAPES) ×3 IMPLANT
ELECT REM PT RETURN 9FT ADLT (ELECTROSURGICAL) ×3
ELECTRODE REM PT RTRN 9FT ADLT (ELECTROSURGICAL) ×1 IMPLANT
GLOVE BIO SURGEON STRL SZ7.5 (GLOVE) ×9 IMPLANT
GOWN STRL REUS W/ TWL LRG LVL3 (GOWN DISPOSABLE) ×3 IMPLANT
GOWN STRL REUS W/TWL LRG LVL3 (GOWN DISPOSABLE) ×6
KIT RM TURNOVER STRD PROC AR (KITS) ×3 IMPLANT
LABEL OR SOLS (LABEL) ×3 IMPLANT
MARGIN MAP 10MM (MISCELLANEOUS) ×3 IMPLANT
NDL SAFETY 18GX1.5 (NEEDLE) IMPLANT
NDL SAFETY 22GX1.5 (NEEDLE) ×3 IMPLANT
NEEDLE HYPO 25X1 1.5 SAFETY (NEEDLE) ×3 IMPLANT
PACK BASIN MINOR ARMC (MISCELLANEOUS) ×3 IMPLANT
SLEVE PROBE SENORX GAMMA FIND (MISCELLANEOUS) ×3 IMPLANT
SUT CHROMIC 4 0 RB 1X27 (SUTURE) ×3 IMPLANT
SUT ETHILON 3-0 FS-10 30 BLK (SUTURE) ×3
SUT MNCRL 4-0 (SUTURE) ×2
SUT MNCRL 4-0 27XMFL (SUTURE) ×1
SUTURE EHLN 3-0 FS-10 30 BLK (SUTURE) ×1 IMPLANT
SUTURE MNCRL 4-0 27XMF (SUTURE) ×1 IMPLANT
SYRINGE 10CC LL (SYRINGE) ×3 IMPLANT
WATER STERILE IRR 1000ML POUR (IV SOLUTION) ×3 IMPLANT

## 2016-01-02 NOTE — Anesthesia Postprocedure Evaluation (Signed)
Anesthesia Post Note  Patient: Courtney Lynn  Procedure(s) Performed: Procedure(s) (LRB): PARTIAL MASTECTOMY WITH NEEDLE LOCALIZATION (Right) SENTINEL NODE BIOPSY (Right)  Patient location during evaluation: PACU Anesthesia Type: General Level of consciousness: awake and alert Pain management: pain level controlled Vital Signs Assessment: post-procedure vital signs reviewed and stable Respiratory status: spontaneous breathing, nonlabored ventilation, respiratory function stable and patient connected to nasal cannula oxygen Cardiovascular status: blood pressure returned to baseline and stable Postop Assessment: no signs of nausea or vomiting Anesthetic complications: no    Last Vitals:  Vitals:   01/02/16 1505 01/02/16 1517  BP: (!) 163/72 (!) 172/70  Pulse: 63 60  Resp: (!) 22 20  Temp:  36.3 C    Last Pain:  Vitals:   01/02/16 1517  TempSrc: Temporal  PainSc: [name]

## 2016-01-02 NOTE — Op Note (Signed)
OPERATIVE REPORT  PREOPERATIVE  DIAGNOSIS: . Right breast cancer  POSTOPERATIVE DIAGNOSIS: . Right breast cancer  PROCEDURE: . Right partial mastectomy with sentinel lymph node biopsy  ANESTHESIA:  General  SURGEON: Rochel Brome  MD   INDICATIONS: . She had recent mammogram demonstrating a density in the lower outer quadrant of the right breast. She had  core needle biopsy with findings of infiltrating mammary carcinoma. Surgery was recommended for definitive treatment. She had preoperative insertion of Kopan's wire. I reviewed her mammogram images prior to surgery seeing the proximity of the Kopan's wire in the biopsy marker.. She also had injection of radioactive technetium sulfur colloid.  With the patient on the operating table in the supine position under general anesthesia the right arm was placed on a lateral arm support. The dressing was removed from the lower outer quadrant of the right breast exposing the Kopan's wire which was cut 2 cm from the skin. The site was imaged with ultrasound demonstrating the location of the biopsy marker. The breast was prepared with ChloraPrep and draped in a sterile manner.  A curvilinear incision was made from the 7:00 position to the 9:00 position of the right breast 8 cm from the nipple and did remove a narrow ellipse of skin with the underlying specimen. Dissection was carried down around the wire and removed a portion of tissue proximally 4 x 4 by 4 cm in dimension. There was some minimal degree of firmness in the specimen. Margin maps were used to label the specimen by placing 3-0 nylon sutures to mark the medial lateral cranial caudal and deep margins. The specimen was submitted for specimen mammogram and pathology to check for margins.  The radiologist did later called to indicate that the biopsy marker was seen within the specimen. The pathologist later called to report the mass was approximately 8 mm in dimension and margins were clear.  The  gamma counter was used to probe the inferior aspect of the axilla. An oblique incision was made in the inferior aspect of the axilla some 5 cm in length and carried down through subcutaneous tissues. Electrocautery was used for hemostasis. The gamma counter was used to demonstrate a location of radioactivity. Dissection was carried down to encounter 2 adjacent lymph nodes each with radioactivity and these were removed in one sample of tissue with some surrounding fatty tissue. The ex vivo measurement was in the range of 550-650 counts per second. The background count was minimal. There was no remaining palpable mass within the axilla. The lymph nodes were submitted fresh for routine pathology.  The axillary wound was infiltrated with half percent Sensorcaine with epinephrine into the subcutaneous tissues. The skin was closed with a running 4-0 Monocryl subcuticular suture.  The partial mastectomy wound was inspected and several small bleeding points were cauterized. Tissues surrounding cautery artifact were infiltrated with half percent Sensorcaine with epinephrine. The subcutaneous tissues were also infiltrated. The subcutaneous tissues were closed with interrupted 4-0 chromic. The skin was closed with running 4-0 Monocryl subcuticular suture. Both wounds were treated with Dermabond and allowed to dry  The patient tolerated the procedure satisfactorily and was then prepared for transfer to the recovery room  [facility].D.

## 2016-01-02 NOTE — Transfer of Care (Signed)
Immediate Anesthesia Transfer of Care Note  Patient: Courtney Lynn  Procedure(s) Performed: Procedure(s): PARTIAL MASTECTOMY WITH NEEDLE LOCALIZATION (Right) SENTINEL NODE BIOPSY (Right)  Patient Location: PACU  Anesthesia Type:General  Level of Consciousness: awake, alert  and oriented  Airway & Oxygen Therapy: Patient Spontanous Breathing and Patient connected to face mask oxygen  Post-op Assessment: Report given to RN and Post -op Vital signs reviewed and stable  Post vital signs: Reviewed and stable  Last Vitals:  Vitals:   01/02/16 1403 01/02/16 1404  BP: (!) 163/65 (!) (P) 163/65  Pulse: 77   Resp: 12   Temp:  (P) 36.6 C    Last Pain:  Vitals:   01/02/16 1013  TempSrc: Oral         Complications: No apparent anesthesia complications

## 2016-01-02 NOTE — Anesthesia Procedure Notes (Signed)
Procedure Name: LMA Insertion Performed by: Lance Muss Pre-anesthesia Checklist: Patient identified, Patient being monitored, Timeout performed, Emergency Drugs available and Suction available Patient Re-evaluated:Patient Re-evaluated prior to inductionOxygen Delivery Method: Circle system utilized Preoxygenation: Pre-oxygenation with 100% oxygen Intubation Type: IV induction Ventilation: Mask ventilation without difficulty LMA: LMA inserted LMA Size: 3.0 Tube type: Oral Number of attempts: 1 Placement Confirmation: positive ETCO2 and breath sounds checked- equal and bilateral Tube secured with: Tape Dental Injury: Teeth and Oropharynx as per pre-operative assessment  Difficulty Due To: Difficult Airway- due to limited oral opening

## 2016-01-02 NOTE — Anesthesia Preprocedure Evaluation (Signed)
Anesthesia Evaluation  Patient identified by MRN, date of birth, ID band Patient awake    Reviewed: Allergy & Precautions, H&P , NPO status , Patient's Chart, lab work & pertinent test results, reviewed documented beta blocker date and time   History of Anesthesia Complications (+) DIFFICULT AIRWAY and history of anesthetic complications  Airway Mallampati: III  TM Distance: <3 FB Neck ROM: full  Mouth opening: Limited Mouth Opening  Dental  (+) Teeth Intact, Poor Dentition   Pulmonary neg pulmonary ROS, sleep apnea , former smoker,    Pulmonary exam normal        Cardiovascular Exercise Tolerance: Good hypertension, negative cardio ROS Normal cardiovascular exam Rate:Normal     Neuro/Psych PSYCHIATRIC DISORDERS negative neurological ROS  negative psych ROS   GI/Hepatic negative GI ROS, Neg liver ROS, GERD  Medicated,  Endo/Other  negative endocrine ROS  Renal/GU negative Renal ROS  negative genitourinary   Musculoskeletal   Abdominal   Peds  Hematology negative hematology ROS (+) anemia ,   Anesthesia Other Findings   Reproductive/Obstetrics negative OB ROS                             Anesthesia Physical Anesthesia Plan  ASA: III  Anesthesia Plan: General LMA   Post-op Pain Management:    Induction:   Airway Management Planned:   Additional Equipment:   Intra-op Plan:   Post-operative Plan:   Informed Consent: I have reviewed the patients History and Physical, chart, labs and discussed the procedure including the risks, benefits and alternatives for the proposed anesthesia with the patient or authorized representative who has indicated his/her understanding and acceptance.     Plan Discussed with: CRNA  Anesthesia Plan Comments:         Anesthesia Quick Evaluation

## 2016-01-02 NOTE — H&P (Signed)
  She reports no new illness since the day of the office exam.  She has had injection of radioactive technetium sulfur colloid. She has had a Kopan's wire inserted. I have reviewed the mammogram images.  Lab work was reviewed.  I discussed the plan for right partial mastectomy with sentinel lymph node biopsy.  The right side was marked YES

## 2016-01-02 NOTE — Discharge Instructions (Addendum)
AMBULATORY SURGERY  DISCHARGE INSTRUCTIONS   1) The drugs that you were given will stay in your system until tomorrow so for the next 24 hours you should not:  A) Drive an automobile B) Make any legal decisions C) Drink any alcoholic beverage   2) You may resume regular meals tomorrow.  Today it is better to start with liquids and gradually work up to solid foods.  You may eat anything you prefer, but it is better to start with liquids, then soup and crackers, and gradually work up to solid foods.   3) Please notify your doctor immediately if you have any unusual bleeding, trouble breathing, redness and pain at the surgery site, drainage, fever, or pain not relieved by medication.    4) Additional Instructions:        Please contact your physician with any problems or Same Day Surgery at [phone], Monday through Friday 6 am to 4 pm, or [place] at [facility] number at [phone].Take Tylenol or Norco as needed for pain.  Should not take Norco and tramadol at the same time.  Should not drive or do anything dangerous when taking Norco or tramadol.  May wear bra as desired for support.  May shower.

## 2016-01-05 ENCOUNTER — Encounter: Payer: Self-pay | Admitting: Surgery

## 2016-01-06 ENCOUNTER — Ambulatory Visit: Payer: PPO | Admitting: Oncology

## 2016-01-06 LAB — SURGICAL PATHOLOGY

## 2016-01-07 NOTE — Progress Notes (Signed)
  Oncology Nurse Navigator Documentation  Navigator Location: [facility]-Med Onc (01/07/16 1500)   )Navigator Encounter Type: Telephone (01/07/16 1500) Telephone: Lahoma Crocker Call (01/07/16 1500)                   Patient Visit Type: Surgery;Follow-up (01/07/16 1500)                              Time Spent with Patient: 15 (01/07/16 1500)  Phoned patient as post -op call.  No answer.  Left message.

## 2016-01-13 DIAGNOSIS — J029 Acute pharyngitis, unspecified: Secondary | ICD-10-CM | POA: Diagnosis not present

## 2016-01-13 DIAGNOSIS — R509 Fever, unspecified: Secondary | ICD-10-CM | POA: Diagnosis not present

## 2016-01-13 DIAGNOSIS — J069 Acute upper respiratory infection, unspecified: Secondary | ICD-10-CM | POA: Diagnosis not present

## 2016-01-13 DIAGNOSIS — B9789 Other viral agents as the cause of diseases classified elsewhere: Secondary | ICD-10-CM | POA: Diagnosis not present

## 2016-01-20 ENCOUNTER — Ambulatory Visit: Payer: PPO | Admitting: Oncology

## 2016-01-22 ENCOUNTER — Ambulatory Visit (INDEPENDENT_AMBULATORY_CARE_PROVIDER_SITE_OTHER): Payer: PPO | Admitting: Podiatry

## 2016-01-22 ENCOUNTER — Ambulatory Visit (INDEPENDENT_AMBULATORY_CARE_PROVIDER_SITE_OTHER): Payer: PPO

## 2016-01-22 ENCOUNTER — Encounter: Payer: Self-pay | Admitting: Podiatry

## 2016-01-22 DIAGNOSIS — B351 Tinea unguium: Secondary | ICD-10-CM

## 2016-01-22 DIAGNOSIS — M84374A Stress fracture, right foot, initial encounter for fracture: Secondary | ICD-10-CM

## 2016-01-22 DIAGNOSIS — M79676 Pain in unspecified toe(s): Secondary | ICD-10-CM | POA: Diagnosis not present

## 2016-01-22 DIAGNOSIS — M779 Enthesopathy, unspecified: Secondary | ICD-10-CM | POA: Diagnosis not present

## 2016-01-27 ENCOUNTER — Inpatient Hospital Stay: Payer: PPO | Admitting: Oncology

## 2016-01-27 ENCOUNTER — Encounter: Payer: Self-pay | Admitting: Radiation Oncology

## 2016-01-27 ENCOUNTER — Ambulatory Visit: Payer: PPO | Admitting: Radiation Oncology

## 2016-01-27 ENCOUNTER — Ambulatory Visit
Admission: RE | Admit: 2016-01-27 | Discharge: 2016-01-27 | Disposition: A | Discharge status: Home / Self Care | Payer: PPO | Source: Ambulatory Visit | Attending: Radiation Oncology | Admitting: Radiation Oncology

## 2016-01-27 VITALS — BP 118/56 | HR 65 | Temp 97.0°F | Wt 191.1 lb

## 2016-01-27 DIAGNOSIS — I10 Essential (primary) hypertension: Secondary | ICD-10-CM | POA: Insufficient documentation

## 2016-01-27 DIAGNOSIS — Z87891 Personal history of nicotine dependence: Secondary | ICD-10-CM | POA: Diagnosis not present

## 2016-01-27 DIAGNOSIS — F329 Major depressive disorder, single episode, unspecified: Secondary | ICD-10-CM | POA: Insufficient documentation

## 2016-01-27 DIAGNOSIS — M129 Arthropathy, unspecified: Secondary | ICD-10-CM | POA: Insufficient documentation

## 2016-01-27 DIAGNOSIS — Z9049 Acquired absence of other specified parts of digestive tract: Secondary | ICD-10-CM | POA: Insufficient documentation

## 2016-01-27 DIAGNOSIS — Z170 Estrogen receptor positive status [ER+]: Secondary | ICD-10-CM | POA: Insufficient documentation

## 2016-01-27 DIAGNOSIS — Z79899 Other long term (current) drug therapy: Secondary | ICD-10-CM | POA: Diagnosis not present

## 2016-01-27 DIAGNOSIS — I499 Cardiac arrhythmia, unspecified: Secondary | ICD-10-CM | POA: Diagnosis not present

## 2016-01-27 DIAGNOSIS — G4730 Sleep apnea, unspecified: Secondary | ICD-10-CM | POA: Diagnosis not present

## 2016-01-27 DIAGNOSIS — Z510 Encounter for antineoplastic radiation therapy: Secondary | ICD-10-CM | POA: Diagnosis not present

## 2016-01-27 DIAGNOSIS — D649 Anemia, unspecified: Secondary | ICD-10-CM | POA: Insufficient documentation

## 2016-01-27 DIAGNOSIS — C50511 Malignant neoplasm of lower-outer quadrant of right female breast: Secondary | ICD-10-CM

## 2016-01-27 DIAGNOSIS — K219 Gastro-esophageal reflux disease without esophagitis: Secondary | ICD-10-CM | POA: Diagnosis not present

## 2016-01-27 DIAGNOSIS — J00 Acute nasopharyngitis [common cold]: Secondary | ICD-10-CM | POA: Insufficient documentation

## 2016-01-27 NOTE — Progress Notes (Signed)
  Oncology Nurse Navigator Documentation  Navigator Location: [facility] (01/27/16 1400)   )Navigator Encounter Type: Telephone (01/27/16 1400) Telephone: Incoming Call;Outgoing Call;Financial Assistance (01/27/16 1400)                                                  Time Spent with Patient: 30 (01/27/16 1400)  Patient has questions about her cost of radiation treatment.  Worked with Mining engineer, and gave patient phone number to [employer], and [employer] [phone].

## 2016-01-27 NOTE — Progress Notes (Deleted)
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  INTERVAL HISTORY: Patient is a 75 year old female who was noted to have an abnormality on routine breast screening mammogram. Subsequent ultrasound biopsy revealed the above stated invasive carcinoma. She currently feels well and is asymptomatic. She has no neurological point. She denies any recent fevers or illnesses. She has a good appetite and denies weight loss. She has no chest pain or shortness of breath. She denies any nausea, vomiting, constipation, or diarrhea. She has no urinary complaints. Patient feels at her baseline and offers no specific complaints today.  REVIEW OF SYSTEMS:   Review of Systems  Constitutional: Negative.  Negative for fever, malaise/fatigue and weight loss.  Respiratory: Negative.  Negative for cough and shortness of breath.   Cardiovascular: Negative.  Negative for chest pain and leg swelling.  Gastrointestinal: Negative.  Negative for abdominal pain.  Genitourinary: Negative.   Musculoskeletal: Negative.   Neurological: Negative.  Negative for weakness.  Psychiatric/Behavioral: The patient is nervous/anxious.     As per HPI. Otherwise, a complete review of systems is negative.  PAST MEDICAL HISTORY: Past Medical History:  Diagnosis Date  . Anemia   . Arthritis   . Complication of anesthesia    has difficulty last surgery intubating  . Depression   . GERD (gastroesophageal reflux disease)   . Hypertension   . Irregular heart beat   . Pre-diabetes   . Reflux   . Sleep apnea     PAST SURGICAL HISTORY: Past Surgical History:  Procedure Laterality Date  . APPENDECTOMY    . BREAST BIOPSY Right  11/26/2015   path pending  . BREAST CYST ASPIRATION    . CHOLECYSTECTOMY    . dental implants    . FOOT SURGERY Right   . PARTIAL MASTECTOMY WITH NEEDLE LOCALIZATION Right 01/02/2016   Procedure: PARTIAL MASTECTOMY WITH NEEDLE LOCALIZATION;  Surgeon: Leonie Green, MD;  Location: [facility] ORS;  Service: General;  Laterality: Right;  . REPLACEMENT TOTAL KNEE Right   . SENTINEL NODE BIOPSY Right 01/02/2016   Procedure: SENTINEL NODE BIOPSY;  Surgeon: Leonie Green, MD;  Location: [facility] ORS;  Service: General;  Laterality: Right;  . TONSILLECTOMY      FAMILY HISTORY: Family History  Problem Relation Age of Onset  . GI Bleed Mother   . Diabetes Father   . Congestive Heart Failure Father   . Breast cancer Neg Hx     ADVANCED DIRECTIVES (Y/N):  N  HEALTH MAINTENANCE: Social History  Substance Use Topics  . Smoking status: Former Research scientist (life sciences)  . Smokeless tobacco: Never Used  . Alcohol use 4.2 oz/week    7 Glasses of wine per week     Colonoscopy:  PAP:  Bone density:  Lipid panel:  Allergies  Allergen Reactions  . Cleocin [Clindamycin Hcl] Swelling, Rash and Other (See Comments)    Tongue, lip swelling Tongue, lip swelling Tongue, lip swelling  . Pen Vk [Penicillin V] Swelling, Rash and Other (See Comments)    Tongue, lip swelling Funny feeling on the tongue  Has patient had a PCN reaction causing immediate rash, facial/tongue/throat swelling, SOB or lightheadedness with hypotension:Yes Has patient had a PCN reaction causing severe rash involving mucus membranes or skin  necrosis:No Has patient had a PCN reaction that required hospitalization:No Has patient had a PCN reaction occurring within the last 10 years:No If all of the above answers are "NO", then may proceed with Cephalosporin use.     Current Outpatient Prescriptions  Medication Sig Dispense Refill  . acetaminophen (TYLENOL) 325 MG tablet Take 650 mg by mouth every 6 (six) hours as needed.    Marland Kitchen amLODipine  (NORVASC) 2.5 MG tablet Take 2.5 mg by mouth daily.  2  . Calcium-Magnesium-Vitamin D (SUPER CAL-MAG-D PO) Take 2 tablets by mouth at bedtime. OsteoMatrix 1000 mg elemental calcium-400 mg magnesium-600 I.U. Vitamin D3    . cephALEXin (KEFLEX) 500 MG capsule Take 2,000 mg by mouth as directed. Prior to dental work.     . citalopram (CELEXA) 10 MG tablet Take 5 mg by mouth every other day. In the evening    . COCONUT OIL PO Take 1 capsule by mouth 2 (two) times daily.    . Glycerin-Hypromellose-PEG 400 ([facility] DRY EYE RELIEF) 0.2-0.2-1 % SOLN Place 1-2 drops into both eyes 2 (two) times daily.    Marland Kitchen HYDROcodone-acetaminophen (NORCO) 5-325 MG tablet Take 1-2 tablets by mouth every 4 (four) hours as needed for moderate pain. 12 tablet 0  . ibuprofen (ADVIL,MOTRIN) 200 MG tablet Take 200 mg by mouth every 8 (eight) hours as needed (for pain.).    Marland Kitchen losartan (COZAAR) 100 MG tablet Take 100 mg by mouth at bedtime.     . meclizine (ANTIVERT) 12.5 MG tablet Take 12.5-25 mg by mouth 3 (three) times daily as needed for dizziness.  0  . meloxicam (MOBIC) 15 MG tablet Take 15 mg by mouth daily.     . metoprolol succinate (TOPROL-XL) 25 MG 24 hr tablet Take 25 mg by mouth daily.    . Multiple Vitamin (MULTIVITAMIN WITH MINERALS) TABS tablet Take 1 tablet by mouth at bedtime.    . NON FORMULARY Take 1 capsule by mouth at bedtime. OmegaGuard (active ingredients) Fish oil 1200 mg-Omega 3 667 mg-EPA 363 MG-DHA 240 MG    . NONFORMULARY OR COMPOUNDED ITEM Take 1-2 capsules by mouth 3 (three) times daily. LunaRich X (Lunasin-soy peptide) 2 capsule in the morning, 2 capsules mid-day, and 1 capsule at bedtime.    . Nutritional Supplements (NUTRITIONAL SUPPLEMENT PO) Take 1 capsule by mouth daily. Protandim Nrf2 Synergizer Milk thistle (Silybum marianum) extract (225 mg) Bacopa (Bacopa monnieri) extract (150 mg) Ashwagandha (Withania somnifera) root (150 mg) Green tea (Camellia sinensis) extract (75 mg) Turmeric  (Curcuma longa) extract (75 mg)    . omeprazole (PRILOSEC) 20 MG capsule Take 20 mg by mouth daily.     Marland Kitchen OVER THE COUNTER MEDICATION Take 1 capsule by mouth at bedtime. Joint Health Complex  (active ingredients) 60 mg Vitamin C-1.5 mg Zinc-0.2 mg Copper-0.2 mg Manganese-1500 mg Glucosamine-100 mg Boswellia     . Probiotic Product (PROBIOTIC PO) Take 1 capsule by mouth daily.    . traMADol (ULTRAM) 50 MG tablet Take 50 mg by mouth every 6 (six) hours as needed (for pain.).      No current facility-administered medications for this visit.     OBJECTIVE: There were no vitals filed for this visit.   There is no height or weight on file to calculate BMI.    ECOG FS:0 - Asymptomatic  General: Well-developed, well-nourished, no acute distress. Eyes: Pink conjunctiva, anicteric sclera. HEENT: Normocephalic, moist mucous membranes, clear oropharnyx. Breasts: Patient requested exam be deferred today. Lungs: Clear to auscultation  bilaterally. Heart: Regular rate and rhythm. No rubs, murmurs, or gallops. Abdomen: Soft, nontender, nondistended. No organomegaly noted, normoactive bowel sounds. Musculoskeletal: No edema, cyanosis, or clubbing. Neuro: Alert, answering all questions appropriately. Cranial nerves grossly intact. Skin: No rashes or petechiae noted. Psych: Normal affect. Lymphatics: No cervical, calvicular, axillary or inguinal LAD.   LAB RESULTS:  Lab Results  Component Value Date   NA 133 (L) 12/22/2015   K 4.0 12/22/2015   CL 99 (L) 12/22/2015   CO2 27 12/22/2015   GLUCOSE 92 12/22/2015   BUN 27 (H) 12/22/2015   CREATININE 0.89 12/22/2015   CALCIUM 9.1 12/22/2015   PROT 6.7 12/22/2015   ALBUMIN 3.8 12/22/2015   AST 24 12/22/2015   ALT 23 12/22/2015   ALKPHOS 71 12/22/2015   BILITOT 0.4 12/22/2015   GFRNONAA >60 12/22/2015   GFRAA >60 12/22/2015    Lab Results  Component Value Date   WBC 6.9 12/22/2015   NEUTROABS 4.3 12/22/2015   HGB 12.5 12/22/2015   HCT 35.9  12/22/2015   MCV 88.4 12/22/2015   PLT 285 12/22/2015     STUDIES: Nm Sentinel Node Injection  Result Date: 01/02/2016 CLINICAL DATA:  Right breast cancer. EXAM: NUCLEAR MEDICINE BREAST LYMPHOSCINTIGRAPHY TECHNIQUE: Intradermal injection of radiopharmaceutical was performed at the 9 o'clock position of the right nipple. The patient was then sent to the operating room where the sentinel node(s) were identified and removed by the surgeon. RADIOPHARMACEUTICALS:  Total of 1 mCi Millipore-filtered Technetium-85msulfur colloid, injected 0.25 mCi. IMPRESSION: Uncomplicated intradermal injection of a total of 1 mCi Technetium-94mulfur colloid for purposes of sentinel node identification. Electronically Signed   By: ArMarybelle Killings.D.   On: 01/02/2016 13:32   Mm Breast Surgical Specimen  Result Date: 01/02/2016 CLINICAL DATA:  Evaluate surgical specimen following right lumpectomy for breast cancer. EXAM: SPECIMEN RADIOGRAPH OF THE RIGHT BREAST COMPARISON:  Previous exam(s). FINDINGS: Status post excision of the right breast. The wire tip and biopsy marker clip are present. IMPRESSION: Specimen radiograph of the right breast. Electronically Signed   By: JeMargarette Canada.D.   On: 01/02/2016 13:13   Mm Rt Plc Breast Loc Dev   1st Lesion  Inc Mammo Guide  Result Date: 01/02/2016 CLINICAL DATA:  [age]ear old female for wire localization of right breast cancer prior to lumpectomy. EXAM: NEEDLE LOCALIZATION OF THE RIGHT BREAST WITH MAMMO GUIDANCE COMPARISON:  Previous exams. FINDINGS: Patient presents for needle localization prior to right lumpectomy. I met with the patient and we discussed the procedure of needle localization including benefits and alternatives. We discussed the high likelihood of a successful procedure. We discussed the risks of the procedure, including infection, bleeding, tissue injury, and further surgery. Informed, written consent was given. The usual time-out protocol was performed immediately  prior to the procedure. Using mammographic guidance, sterile technique, 1% lidocaine and a 9 cm modified Kopans needle, the biopsy clip was localized using a lateral approach. The images were marked for Dr. SmTamala JulianIMPRESSION: Needle localization right breast. No apparent complications. Electronically Signed   By: JeMargarette Canada.D.   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 Will order mammoprint to assess if patient is either low risk or high risk. Although patient has already indicated that she likely would refuse any chemotherapy if recommended. Patient will require adjuvant XRT if she has lumpectomy. She will also benefit from an aromatase inhibitor for 5 years given the ER/PR status of her tumor. No further intervention is needed at this time. Follow-up 1-2 weeks after her lumpectomy to discuss the final pathology results and additional treatment planning.  Approximate 45 minutes was spent in discussion of which came 50% was consultation.  Patient expressed understanding and was in agreement with this plan. She also understands that She can call clinic at any time with any questions, concerns, or complaints.   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 [facility])   Staging form: Breast, AJCC 7th Edition   - Clinical stage from 12/07/2015: Stage IA (T1b, N0, M0) - Signed by Lloyd Huger, MD on 12/07/2015  Lloyd Huger, MD   01/27/2016 8:41 AM

## 2016-01-27 NOTE — Progress Notes (Signed)
  Oncology Nurse Navigator Documentation  Navigator Location: [facility] (01/27/16 1300)   )Navigator Encounter Type: Initial RadOnc (01/27/16 1300)                                                    Time Spent with Patient: 45 (01/27/16 1300)  Met with patient at initial radiation Oncology appointment.  Patient to return on 02/03/15 for simulation.  Explained patient had low Mammoprint score, and will not need follow-up with Dr. Grayland Ormond until completion of radiation.

## 2016-01-27 NOTE — Consult Note (Signed)
NEW PATIENT EVALUATION  Name: Courtney Lynn  MRN: [id number]  Date:   01/27/2016     DOB: 1940-05-31   This 75 y.o. female patient presents to the clinic for initial evaluation of stage IA (T1 N0 M0) ER/PR positive HER-2/neu negative invasive mammary carcinoma of the right breast as was wide local excision and sentinel node biopsy  REFERRING PHYSICIAN: Glendon Axe, MD  CHIEF COMPLAINT:  Chief Complaint  Patient presents with  . Breast Cancer    Initial evaluation    DIAGNOSIS: The encounter diagnosis was Malignant neoplasm of lower-outer quadrant of right breast of female, estrogen receptor positive ([place]).   PREVIOUS INVESTIGATIONS:  Mammograms and ultrasound reviewed Pathology reports reviewed Clinical notes reviewed  HPI:  patient is a 75 year old female who presented with an abnormal mammogram of the right breast showing a 0.8 x 0.4 x 0.5 cm lesion in the lower outer quadrant confirmed on ultrasound to be hypoechoic area concerning for malignancy. She went underwent ultrasound-guided biopsy which was positive for invasive mammary carcinoma ER/PR positive HER-2/neu negative. She underwent wide local excision and sentinel node biopsy showing 2 sentinel lymph nodes negative for malignancy. Tumor was 8 mm overall grade 2 with margins clear at 3 mm. She has done well postoperatively. She had MammaPrint performed showing a low risk for recurrence. She will not receive systemic chemotherapy. She is now referred to radiation oncology for consideration of treatment. She is doing well. She states she's been having a cold she is dealing with although that is improving and does not require antibiotic therapy. She specifically denies breast tenderness cough or bone pain.  PLANNED TREATMENT REGIMEN:  whole breast radiation  PAST MEDICAL HISTORY:  has a past medical history of Anemia; Arthritis; Complication of anesthesia; Depression; GERD (gastroesophageal reflux disease); Hypertension; Irregular  heart beat; Pre-diabetes; Reflux; and Sleep apnea.    PAST SURGICAL HISTORY:  Past Surgical History:  Procedure Laterality Date  . APPENDECTOMY    . BREAST BIOPSY Right 11/26/2015   path pending  . BREAST CYST ASPIRATION    . CHOLECYSTECTOMY    . dental implants    . FOOT SURGERY Right   . PARTIAL MASTECTOMY WITH NEEDLE LOCALIZATION Right 01/02/2016   Procedure: PARTIAL MASTECTOMY WITH NEEDLE LOCALIZATION;  Surgeon: Leonie Green, MD;  Location: [facility] ORS;  Service: General;  Laterality: Right;  . REPLACEMENT TOTAL KNEE Right   . SENTINEL NODE BIOPSY Right 01/02/2016   Procedure: SENTINEL NODE BIOPSY;  Surgeon: Leonie Green, MD;  Location: [facility] ORS;  Service: General;  Laterality: Right;  . TONSILLECTOMY      FAMILY HISTORY: family history includes Congestive Heart Failure in her father; Diabetes in her father; GI Bleed in her mother.  SOCIAL HISTORY:  reports that she has quit smoking. She has never used smokeless tobacco. She reports that she drinks about 4.2 oz of alcohol per week . She reports that she does not use drugs.  ALLERGIES: Cleocin [clindamycin hcl] and Pen vk [penicillin v]  MEDICATIONS:  Current Outpatient Prescriptions  Medication Sig Dispense Refill  . acetaminophen (TYLENOL) 325 MG tablet Take 650 mg by mouth every 6 (six) hours as needed.    Marland Kitchen amLODipine (NORVASC) 2.5 MG tablet Take 2.5 mg by mouth daily.  2  . Calcium-Magnesium-Vitamin D (SUPER CAL-MAG-D PO) Take 2 tablets by mouth at bedtime. OsteoMatrix 1000 mg elemental calcium-400 mg magnesium-600 I.U. Vitamin D3    . cephALEXin (KEFLEX) 500 MG capsule Take 2,000 mg by mouth as directed. Prior  to dental work.     . citalopram (CELEXA) 10 MG tablet Take 5 mg by mouth every other day. In the evening    . COCONUT OIL PO Take 1 capsule by mouth 2 (two) times daily.    . fluticasone (FLONASE) 50 MCG/ACT nasal spray     . Glycerin-Hypromellose-PEG 400 ([facility] DRY EYE RELIEF) 0.2-0.2-1 % SOLN Place 1-2  drops into both eyes 2 (two) times daily.    Marland Kitchen HYDROcodone-acetaminophen (NORCO) 5-325 MG tablet Take 1-2 tablets by mouth every 4 (four) hours as needed for moderate pain. 12 tablet 0  . ibuprofen (ADVIL,MOTRIN) 200 MG tablet Take 200 mg by mouth every 8 (eight) hours as needed (for pain.).    Marland Kitchen losartan (COZAAR) 100 MG tablet Take 100 mg by mouth at bedtime.     . meclizine (ANTIVERT) 12.5 MG tablet Take 12.5-25 mg by mouth 3 (three) times daily as needed for dizziness.  0  . meloxicam (MOBIC) 15 MG tablet Take 15 mg by mouth daily.     . metoprolol succinate (TOPROL-XL) 25 MG 24 hr tablet Take 25 mg by mouth daily.    . Multiple Vitamin (MULTIVITAMIN WITH MINERALS) TABS tablet Take 1 tablet by mouth at bedtime.    . NON FORMULARY Take 1 capsule by mouth at bedtime. OmegaGuard (active ingredients) Fish oil 1200 mg-Omega 3 667 mg-EPA 363 MG-DHA 240 MG    . NONFORMULARY OR COMPOUNDED ITEM Take 1-2 capsules by mouth 3 (three) times daily. LunaRich X (Lunasin-soy peptide) 2 capsule in the morning, 2 capsules mid-day, and 1 capsule at bedtime.    . Nutritional Supplements (NUTRITIONAL SUPPLEMENT PO) Take 1 capsule by mouth daily. Protandim Nrf2 Synergizer Milk thistle (Silybum marianum) extract (225 mg) Bacopa (Bacopa monnieri) extract (150 mg) Ashwagandha (Withania somnifera) root (150 mg) Green tea (Camellia sinensis) extract (75 mg) Turmeric (Curcuma longa) extract (75 mg)    . omeprazole (PRILOSEC) 20 MG capsule Take 20 mg by mouth daily.     Marland Kitchen OVER THE COUNTER MEDICATION Take 1 capsule by mouth at bedtime. Joint Health Complex  (active ingredients) 60 mg Vitamin C-1.5 mg Zinc-0.2 mg Copper-0.2 mg Manganese-1500 mg Glucosamine-100 mg Boswellia     . Probiotic Product (PROBIOTIC PO) Take 1 capsule by mouth daily.    . traMADol (ULTRAM) 50 MG tablet Take 50 mg by mouth every 6 (six) hours as needed (for pain.).      No current facility-administered medications for this encounter.     ECOG  PERFORMANCE STATUS:  0 - Asymptomatic  REVIEW OF SYSTEMS:  Patient denies any weight loss, fatigue, weakness, fever, chills or night sweats. Patient denies any loss of vision, blurred vision. Patient denies any ringing  of the ears or hearing loss. No irregular heartbeat. Patient denies heart murmur or history of fainting. Patient denies any chest pain or pain radiating to her upper extremities. Patient denies any shortness of breath, difficulty breathing at night, cough or hemoptysis. Patient denies any swelling in the lower legs. Patient denies any nausea vomiting, vomiting of blood, or coffee ground material in the vomitus. Patient denies any stomach pain. Patient states has had normal bowel movements no significant constipation or diarrhea. Patient denies any dysuria, hematuria or significant nocturia. Patient denies any problems walking, swelling in the joints or loss of balance. Patient denies any skin changes, loss of hair or loss of weight. Patient denies any excessive worrying or anxiety or significant depression. Patient denies any problems with insomnia. Patient denies excessive thirst,  polyuria, polydipsia. Patient denies any swollen glands, patient denies easy bruising or easy bleeding. Patient denies any recent infections, allergies or URI. Patient "s visual fields have not changed significantly in recent time.    PHYSICAL EXAM: BP (!) 118/56   Pulse 65   Temp 97 F (36.1 C)   Wt 191 lb 2.2 oz (86.7 kg)   BMI 33.86 kg/m  she status post wide local excision of the right breast incision is healed well. No dominant mass or nodularity is noted in either breast in 2 positions examined. No axillary or supraclavicular adenopathy is appreciated. Well-developed well-nourished patient in NAD. HEENT reveals PERLA, EOMI, discs not visualized.  Oral cavity is clear. No oral mucosal lesions are identified. Neck is clear without evidence of cervical or supraclavicular adenopathy. Lungs are clear to  A&P. Cardiac examination is essentially unremarkable with regular rate and rhythm without murmur rub or thrill. Abdomen is benign with no organomegaly or masses noted. Motor sensory and DTR levels are equal and symmetric in the upper and lower extremities. Cranial nerves II through XII are grossly intact. Proprioception is intact. No peripheral adenopathy or edema is identified. No motor or sensory levels are noted. Crude visual fields are within normal range.  LABORATORY DATA:  pathology reports reviewed    RADIOLOGY REMAMMOGRAM ULTRASOUND REVIEWED   IMPRESSION:  stage I invasive mammary carcinoma the right breast as was wide local excision and sentinel node biopsy ER/PR positive HER-2/neu negative with low risk of recurrence by MammaPrint in 75 year old female   PLAN:  At this time I have recommended whole breast radiation. Patient's breasts are rather pendulous making hypofractionated course of treatment difficult. Would plan on delivering 5040 cGy in 28 fractions boosting her scar another 1400 cGy using electron beam based on the close 3 mm margin. Risks and benefits of treatment including skin reaction fatigue alteration of blood counts possible inclusion of superficial lung all were discussed in detail with the patient. Nurse navigator was present during our discussion. I first set up and ordered CT simulation right after the New Year's holiday. Patient also will be a candidate for antiestrogen therapy after completion of radiation. I have again confirmed with medical oncology based on MammaPrint that systemic chemotherapy is not indicated.  I would like to take this opportunity to thank you for allowing me to participate in the care of your patient.Armstead Peaks., MD

## 2016-01-28 NOTE — Progress Notes (Signed)
Subjective: 76 y.o. returns the office today for painful, elongated, thickened toenails which she cannot trim herself. Denies any redness or drainage around the nails. The last couple weeks she started have pain to the right foot and she points to the fifth metatarsal base where she is the majority pain. She denies any recent injury or trauma. She has some occasional swelling but no redness or warmth. She states the left was doing well having no pain. Denies any acute changes since last appointment and no new complaints today. Denies any systemic complaints such as fevers, chills, nausea, vomiting.   Objective: AAO 3, NAD DP/PT pulses palpable, CRT less than 3 seconds Nails hypertrophic, dystrophic, elongated, brittle, discolored 10. There is tenderness overlying the nails 1-5 bilaterally. There is no surrounding erythema or drainage along the nail sites. No open lesions or pre-ulcerative lesions are identified. There is tenderness the lateral aspect of the right foot on the fifth metatarsal base and there is mild pain in vibratory sensation. There is trace edema is no erythema or increase in warmth. No other areas of tenderness in the right foot. There is no areas of tenderness the left foot otherwise. No other areas of tenderness bilateral lower extremities. No overlying edema, erythema, increased warmth. No pain with calf compression, swelling, warmth, erythema.  Assessment: Patient presents with symptomatic onychomycosis; stress fracture fifth metatarsal  Plan: -Treatment options including alternatives, risks, complications were discussed -Nails sharply debrided 10 without complication/bleeding. -X-rays obtained and reviewed. There are significant osteoarthritic changes present Lisfranc joint. Compared to the prior x-rays there is changes to the fifth metatarsal base consistent with a stress fracture. We'll mobilize and a CAM boot the chart he has at home. Ice and elevation limited activity.  Long-term discussed with her custom orthotics to help support her feet to hopefully help prevent this from happening again. -Discussed daily foot inspection. If there are any changes, to call the office immediately.  -Follow-up in 2-3 weeks or sooner if any problems are to arise. In the meantime, encouraged to call the office with any questions, concerns, changes symptoms.  Celesta Gentile, DPM

## 2016-02-03 ENCOUNTER — Ambulatory Visit
Admission: RE | Admit: 2016-02-03 | Discharge: 2016-02-03 | Disposition: A | Discharge status: Home / Self Care | Payer: PPO | Source: Ambulatory Visit | Attending: Radiation Oncology | Admitting: Radiation Oncology

## 2016-02-03 DIAGNOSIS — Z510 Encounter for antineoplastic radiation therapy: Secondary | ICD-10-CM | POA: Diagnosis not present

## 2016-02-03 DIAGNOSIS — Z170 Estrogen receptor positive status [ER+]: Secondary | ICD-10-CM | POA: Diagnosis not present

## 2016-02-03 DIAGNOSIS — C50511 Malignant neoplasm of lower-outer quadrant of right female breast: Secondary | ICD-10-CM | POA: Diagnosis not present

## 2016-02-04 DIAGNOSIS — I341 Nonrheumatic mitral (valve) prolapse: Secondary | ICD-10-CM | POA: Diagnosis not present

## 2016-02-04 DIAGNOSIS — R001 Bradycardia, unspecified: Secondary | ICD-10-CM | POA: Diagnosis not present

## 2016-02-04 DIAGNOSIS — I340 Nonrheumatic mitral (valve) insufficiency: Secondary | ICD-10-CM | POA: Diagnosis not present

## 2016-02-04 DIAGNOSIS — G4733 Obstructive sleep apnea (adult) (pediatric): Secondary | ICD-10-CM | POA: Diagnosis not present

## 2016-02-04 DIAGNOSIS — I10 Essential (primary) hypertension: Secondary | ICD-10-CM | POA: Diagnosis not present

## 2016-02-04 DIAGNOSIS — I272 Pulmonary hypertension, unspecified: Secondary | ICD-10-CM | POA: Diagnosis not present

## 2016-02-04 DIAGNOSIS — I493 Ventricular premature depolarization: Secondary | ICD-10-CM | POA: Diagnosis not present

## 2016-02-04 DIAGNOSIS — Z9989 Dependence on other enabling machines and devices: Secondary | ICD-10-CM | POA: Diagnosis not present

## 2016-02-06 ENCOUNTER — Other Ambulatory Visit: Payer: Self-pay | Admitting: *Deleted

## 2016-02-06 DIAGNOSIS — C50511 Malignant neoplasm of lower-outer quadrant of right female breast: Secondary | ICD-10-CM | POA: Diagnosis not present

## 2016-02-06 DIAGNOSIS — Z170 Estrogen receptor positive status [ER+]: Secondary | ICD-10-CM | POA: Diagnosis not present

## 2016-02-06 DIAGNOSIS — Z510 Encounter for antineoplastic radiation therapy: Secondary | ICD-10-CM | POA: Diagnosis not present

## 2016-02-12 ENCOUNTER — Encounter: Payer: Self-pay | Admitting: Podiatry

## 2016-02-12 ENCOUNTER — Ambulatory Visit (INDEPENDENT_AMBULATORY_CARE_PROVIDER_SITE_OTHER): Payer: PPO | Admitting: Podiatry

## 2016-02-12 ENCOUNTER — Ambulatory Visit (INDEPENDENT_AMBULATORY_CARE_PROVIDER_SITE_OTHER): Payer: PPO

## 2016-02-12 DIAGNOSIS — M84374A Stress fracture, right foot, initial encounter for fracture: Secondary | ICD-10-CM

## 2016-02-12 DIAGNOSIS — M19071 Primary osteoarthritis, right ankle and foot: Secondary | ICD-10-CM | POA: Diagnosis not present

## 2016-02-12 DIAGNOSIS — M779 Enthesopathy, unspecified: Secondary | ICD-10-CM

## 2016-02-12 DIAGNOSIS — G4733 Obstructive sleep apnea (adult) (pediatric): Secondary | ICD-10-CM | POA: Diagnosis not present

## 2016-02-13 ENCOUNTER — Ambulatory Visit
Admission: RE | Admit: 2016-02-13 | Discharge: 2016-02-13 | Disposition: A | Discharge status: Home / Self Care | Payer: PPO | Source: Ambulatory Visit | Attending: Radiation Oncology | Admitting: Radiation Oncology

## 2016-02-13 DIAGNOSIS — Z510 Encounter for antineoplastic radiation therapy: Secondary | ICD-10-CM | POA: Diagnosis not present

## 2016-02-13 DIAGNOSIS — Z170 Estrogen receptor positive status [ER+]: Secondary | ICD-10-CM | POA: Diagnosis not present

## 2016-02-13 DIAGNOSIS — C50511 Malignant neoplasm of lower-outer quadrant of right female breast: Secondary | ICD-10-CM | POA: Diagnosis not present

## 2016-02-13 NOTE — Progress Notes (Signed)
Subjective: Patient presents the office today for follow-up evaluation of right foot fifth metatarsal fracture. She states that the pain is improving and she is continuing the CAM boot. She denies any increase in swelling or any redness. She denies any recent injury or trauma. Denies any systemic complaints such as fevers, chills, nausea, vomiting. No acute changes since last appointment, and no other complaints at this time.   She started radiation therapy for breast cancer tomorrow.  Objective: AAO x3, NAD DP/PT pulses palpable bilaterally, CRT less than 3 seconds Discontinued the decreased tenderness palpation of the fifth metatarsal base and the right foot and to a lesser degree the fourth metatarsal. There is no other area pinpoint bony tenderness or pain the vibratory sensation to bilateral lower extremities. Overall there is prominence the fifth metatarsal bases bilaterally. There is curvature the foot with the forefoot adducted.  No open lesions or pre-ulcerative lesions.  No pain with calf compression, swelling, warmth, erythema  Assessment: History of multiple stress fractures with fracture of the right foot currently. Significant osteoarthritis  Plan: -All treatment options discussed with the patient including all alternatives, risks, complications.  -X-rays obtained and reviewed. Healing fractures present in the right foot on the metatarsal bases. -Recommended continue in CAM boot for now. Ice and elevation limits activity. -Long-term she may benefit more from a hinged AFO. A prescription was provided today for this. -Follow up in 3 weeks or sooner if needed. *X-ray next appointment  -Patient encouraged to call the office with any questions, concerns, change in symptoms.   Celesta Gentile, DPM

## 2016-02-16 ENCOUNTER — Ambulatory Visit
Admission: RE | Admit: 2016-02-16 | Discharge: 2016-02-16 | Disposition: A | Discharge status: Home / Self Care | Payer: PPO | Source: Ambulatory Visit | Attending: Radiation Oncology | Admitting: Radiation Oncology

## 2016-02-16 DIAGNOSIS — Z510 Encounter for antineoplastic radiation therapy: Secondary | ICD-10-CM | POA: Diagnosis not present

## 2016-02-17 ENCOUNTER — Ambulatory Visit
Admission: RE | Admit: 2016-02-17 | Discharge: 2016-02-17 | Disposition: A | Discharge status: Home / Self Care | Payer: PPO | Source: Ambulatory Visit | Attending: Radiation Oncology | Admitting: Radiation Oncology

## 2016-02-17 DIAGNOSIS — Z510 Encounter for antineoplastic radiation therapy: Secondary | ICD-10-CM | POA: Diagnosis not present

## 2016-02-18 ENCOUNTER — Ambulatory Visit
Admission: RE | Admit: 2016-02-18 | Discharge: 2016-02-18 | Disposition: A | Discharge status: Home / Self Care | Payer: PPO | Source: Ambulatory Visit | Attending: Radiation Oncology | Admitting: Radiation Oncology

## 2016-02-18 DIAGNOSIS — Z510 Encounter for antineoplastic radiation therapy: Secondary | ICD-10-CM | POA: Diagnosis not present

## 2016-02-19 ENCOUNTER — Ambulatory Visit: Payer: PPO

## 2016-02-20 ENCOUNTER — Ambulatory Visit
Admission: RE | Admit: 2016-02-20 | Discharge: 2016-02-20 | Disposition: A | Discharge status: Home / Self Care | Payer: PPO | Source: Ambulatory Visit | Attending: Radiation Oncology | Admitting: Radiation Oncology

## 2016-02-20 DIAGNOSIS — Z510 Encounter for antineoplastic radiation therapy: Secondary | ICD-10-CM | POA: Diagnosis not present

## 2016-02-23 ENCOUNTER — Ambulatory Visit
Admission: RE | Admit: 2016-02-23 | Discharge: 2016-02-23 | Disposition: A | Discharge status: Home / Self Care | Payer: PPO | Source: Ambulatory Visit | Attending: Radiation Oncology | Admitting: Radiation Oncology

## 2016-02-23 DIAGNOSIS — C50511 Malignant neoplasm of lower-outer quadrant of right female breast: Secondary | ICD-10-CM | POA: Diagnosis not present

## 2016-02-23 DIAGNOSIS — Z170 Estrogen receptor positive status [ER+]: Secondary | ICD-10-CM | POA: Diagnosis not present

## 2016-02-23 DIAGNOSIS — Z510 Encounter for antineoplastic radiation therapy: Secondary | ICD-10-CM | POA: Diagnosis not present

## 2016-02-24 ENCOUNTER — Ambulatory Visit
Admission: RE | Admit: 2016-02-24 | Discharge: 2016-02-24 | Disposition: A | Discharge status: Home / Self Care | Payer: PPO | Source: Ambulatory Visit | Attending: Radiation Oncology | Admitting: Radiation Oncology

## 2016-02-24 DIAGNOSIS — Z510 Encounter for antineoplastic radiation therapy: Secondary | ICD-10-CM | POA: Diagnosis not present

## 2016-02-25 ENCOUNTER — Ambulatory Visit
Admission: RE | Admit: 2016-02-25 | Discharge: 2016-02-25 | Disposition: A | Discharge status: Home / Self Care | Payer: PPO | Source: Ambulatory Visit | Attending: Radiation Oncology | Admitting: Radiation Oncology

## 2016-02-25 DIAGNOSIS — Z510 Encounter for antineoplastic radiation therapy: Secondary | ICD-10-CM | POA: Diagnosis not present

## 2016-02-26 ENCOUNTER — Ambulatory Visit
Admission: RE | Admit: 2016-02-26 | Discharge: 2016-02-26 | Disposition: A | Discharge status: Home / Self Care | Payer: PPO | Source: Ambulatory Visit | Attending: Radiation Oncology | Admitting: Radiation Oncology

## 2016-02-26 DIAGNOSIS — Z510 Encounter for antineoplastic radiation therapy: Secondary | ICD-10-CM | POA: Diagnosis not present

## 2016-02-27 ENCOUNTER — Ambulatory Visit: Payer: PPO

## 2016-02-27 DIAGNOSIS — Z510 Encounter for antineoplastic radiation therapy: Secondary | ICD-10-CM | POA: Diagnosis not present

## 2016-03-01 ENCOUNTER — Ambulatory Visit: Payer: PPO

## 2016-03-01 ENCOUNTER — Inpatient Hospital Stay: Payer: PPO | Attending: Oncology

## 2016-03-01 DIAGNOSIS — Z510 Encounter for antineoplastic radiation therapy: Secondary | ICD-10-CM | POA: Diagnosis not present

## 2016-03-01 DIAGNOSIS — C50511 Malignant neoplasm of lower-outer quadrant of right female breast: Secondary | ICD-10-CM | POA: Diagnosis not present

## 2016-03-01 DIAGNOSIS — Z79811 Long term (current) use of aromatase inhibitors: Secondary | ICD-10-CM | POA: Diagnosis not present

## 2016-03-01 DIAGNOSIS — Z170 Estrogen receptor positive status [ER+]: Secondary | ICD-10-CM | POA: Insufficient documentation

## 2016-03-01 LAB — CBC
HEMATOCRIT: 34.2 % — AB (ref 35.0–47.0)
Hemoglobin: 11.7 g/dL — ABNORMAL LOW (ref 12.0–16.0)
MCH: 29.5 pg (ref 26.0–34.0)
MCHC: 34.1 g/dL (ref 32.0–36.0)
MCV: 86.4 fL (ref 80.0–100.0)
Platelets: 273 10*3/uL (ref 150–440)
RBC: 3.96 MIL/uL (ref 3.80–5.20)
RDW: 14.6 % — ABNORMAL HIGH (ref 11.5–14.5)
WBC: 7 10*3/uL (ref 3.6–11.0)

## 2016-03-02 ENCOUNTER — Ambulatory Visit: Payer: PPO

## 2016-03-02 DIAGNOSIS — Z510 Encounter for antineoplastic radiation therapy: Secondary | ICD-10-CM | POA: Diagnosis not present

## 2016-03-03 ENCOUNTER — Ambulatory Visit
Admission: RE | Admit: 2016-03-03 | Discharge: 2016-03-03 | Disposition: A | Discharge status: Home / Self Care | Payer: PPO | Source: Ambulatory Visit | Attending: Radiation Oncology | Admitting: Radiation Oncology

## 2016-03-03 DIAGNOSIS — Z510 Encounter for antineoplastic radiation therapy: Secondary | ICD-10-CM | POA: Diagnosis not present

## 2016-03-04 ENCOUNTER — Ambulatory Visit (INDEPENDENT_AMBULATORY_CARE_PROVIDER_SITE_OTHER): Payer: PPO | Admitting: Podiatry

## 2016-03-04 ENCOUNTER — Ambulatory Visit
Admission: RE | Admit: 2016-03-04 | Discharge: 2016-03-04 | Disposition: A | Discharge status: Home / Self Care | Payer: PPO | Source: Ambulatory Visit | Attending: Radiation Oncology | Admitting: Radiation Oncology

## 2016-03-04 ENCOUNTER — Ambulatory Visit (INDEPENDENT_AMBULATORY_CARE_PROVIDER_SITE_OTHER): Payer: PPO

## 2016-03-04 DIAGNOSIS — M84374D Stress fracture, right foot, subsequent encounter for fracture with routine healing: Secondary | ICD-10-CM

## 2016-03-04 DIAGNOSIS — M84374A Stress fracture, right foot, initial encounter for fracture: Secondary | ICD-10-CM

## 2016-03-04 DIAGNOSIS — M19079 Primary osteoarthritis, unspecified ankle and foot: Secondary | ICD-10-CM

## 2016-03-04 DIAGNOSIS — Z510 Encounter for antineoplastic radiation therapy: Secondary | ICD-10-CM | POA: Diagnosis not present

## 2016-03-05 ENCOUNTER — Ambulatory Visit
Admission: RE | Admit: 2016-03-05 | Discharge: 2016-03-05 | Disposition: A | Discharge status: Home / Self Care | Payer: PPO | Source: Ambulatory Visit | Attending: Radiation Oncology | Admitting: Radiation Oncology

## 2016-03-05 DIAGNOSIS — Z510 Encounter for antineoplastic radiation therapy: Secondary | ICD-10-CM | POA: Diagnosis not present

## 2016-03-05 NOTE — Progress Notes (Signed)
Subjective: Patient presents the office today for follow-up evaluation of right foot fifth metatarsal fracture. She states that she's remain in a cam boot and she is doing better. She exhibits some occasional intermittent discomfort but overall is much improved. She did go to [employer] [facility] and she was fitted for a brace. She is awaiting this. Denies any systemic complaints such as fevers, chills, nausea, vomiting. No acute changes since last appointment, and no other complaints at this time.   Objective: AAO x3, NAD DP/PT pulses palpable bilaterally, CRT less than 3 seconds There is significantly improved tenderness to palpation of the fifth metatarsal base. There is no other areas of pinpoint bony tenderness or pain the vibratory sensation bilaterally. There is no on edema, erythema, increase in warmth bilaterally.  No open lesions or pre-ulcerative lesions.  No pain with calf compression, swelling, warmth, erythema  Assessment: Significant Lisfranc osteoarthritis with history of multiple stress fractures.  Plan: -All treatment options discussed with the patient including all alternatives, risks, complications.  -X-rays obtained and reviewed. Healing fractures present in the right foot on the metatarsal bases. -She started transition to regular shoe as tolerated. She is awaiting her brace. She will get this later this month. Hopefully this will help support her foot to help prevent further stress fractures and pain along the midfoot. -Follow-up as scheduled or sooner if needed. Call any questions or concerns meantime.  Celesta Gentile, DPM

## 2016-03-08 ENCOUNTER — Ambulatory Visit
Admission: RE | Admit: 2016-03-08 | Discharge: 2016-03-08 | Disposition: A | Discharge status: Home / Self Care | Payer: PPO | Source: Ambulatory Visit | Attending: Radiation Oncology | Admitting: Radiation Oncology

## 2016-03-08 DIAGNOSIS — C50511 Malignant neoplasm of lower-outer quadrant of right female breast: Secondary | ICD-10-CM | POA: Diagnosis not present

## 2016-03-08 DIAGNOSIS — Z170 Estrogen receptor positive status [ER+]: Secondary | ICD-10-CM | POA: Diagnosis not present

## 2016-03-08 DIAGNOSIS — Z510 Encounter for antineoplastic radiation therapy: Secondary | ICD-10-CM | POA: Diagnosis not present

## 2016-03-09 ENCOUNTER — Ambulatory Visit
Admission: RE | Admit: 2016-03-09 | Discharge: 2016-03-09 | Disposition: A | Discharge status: Home / Self Care | Payer: PPO | Source: Ambulatory Visit | Attending: Radiation Oncology | Admitting: Radiation Oncology

## 2016-03-09 DIAGNOSIS — Z510 Encounter for antineoplastic radiation therapy: Secondary | ICD-10-CM | POA: Diagnosis not present

## 2016-03-10 ENCOUNTER — Ambulatory Visit
Admission: RE | Admit: 2016-03-10 | Discharge: 2016-03-10 | Disposition: A | Discharge status: Home / Self Care | Payer: PPO | Source: Ambulatory Visit | Attending: Radiation Oncology | Admitting: Radiation Oncology

## 2016-03-10 ENCOUNTER — Ambulatory Visit: Payer: PPO

## 2016-03-10 DIAGNOSIS — Z510 Encounter for antineoplastic radiation therapy: Secondary | ICD-10-CM | POA: Diagnosis not present

## 2016-03-11 ENCOUNTER — Ambulatory Visit
Admission: RE | Admit: 2016-03-11 | Discharge: 2016-03-11 | Disposition: A | Discharge status: Home / Self Care | Payer: PPO | Source: Ambulatory Visit | Attending: Radiation Oncology | Admitting: Radiation Oncology

## 2016-03-11 DIAGNOSIS — Z510 Encounter for antineoplastic radiation therapy: Secondary | ICD-10-CM | POA: Diagnosis not present

## 2016-03-12 ENCOUNTER — Ambulatory Visit: Admission: RE | Admit: 2016-03-12 | Payer: PPO | Source: Ambulatory Visit

## 2016-03-12 ENCOUNTER — Ambulatory Visit
Admission: RE | Admit: 2016-03-12 | Discharge: 2016-03-12 | Disposition: A | Discharge status: Home / Self Care | Payer: PPO | Source: Ambulatory Visit | Attending: Radiation Oncology | Admitting: Radiation Oncology

## 2016-03-12 DIAGNOSIS — Z510 Encounter for antineoplastic radiation therapy: Secondary | ICD-10-CM | POA: Diagnosis not present

## 2016-03-15 ENCOUNTER — Ambulatory Visit
Admission: RE | Admit: 2016-03-15 | Discharge: 2016-03-15 | Disposition: A | Discharge status: Home / Self Care | Payer: PPO | Source: Ambulatory Visit | Attending: Radiation Oncology | Admitting: Radiation Oncology

## 2016-03-15 ENCOUNTER — Inpatient Hospital Stay: Payer: PPO

## 2016-03-15 DIAGNOSIS — Z510 Encounter for antineoplastic radiation therapy: Secondary | ICD-10-CM | POA: Diagnosis not present

## 2016-03-15 DIAGNOSIS — C50511 Malignant neoplasm of lower-outer quadrant of right female breast: Secondary | ICD-10-CM | POA: Diagnosis not present

## 2016-03-15 DIAGNOSIS — Z170 Estrogen receptor positive status [ER+]: Secondary | ICD-10-CM | POA: Diagnosis not present

## 2016-03-16 ENCOUNTER — Ambulatory Visit
Admission: RE | Admit: 2016-03-16 | Discharge: 2016-03-16 | Disposition: A | Discharge status: Home / Self Care | Payer: PPO | Source: Ambulatory Visit | Attending: Radiation Oncology | Admitting: Radiation Oncology

## 2016-03-16 ENCOUNTER — Inpatient Hospital Stay: Payer: PPO | Attending: Oncology

## 2016-03-16 DIAGNOSIS — Z510 Encounter for antineoplastic radiation therapy: Secondary | ICD-10-CM | POA: Diagnosis not present

## 2016-03-16 DIAGNOSIS — Z170 Estrogen receptor positive status [ER+]: Secondary | ICD-10-CM | POA: Diagnosis not present

## 2016-03-16 DIAGNOSIS — C50511 Malignant neoplasm of lower-outer quadrant of right female breast: Secondary | ICD-10-CM | POA: Insufficient documentation

## 2016-03-16 DIAGNOSIS — I493 Ventricular premature depolarization: Secondary | ICD-10-CM | POA: Diagnosis not present

## 2016-03-16 LAB — CBC
HEMATOCRIT: 35.2 % (ref 35.0–47.0)
HEMOGLOBIN: 11.8 g/dL — AB (ref 12.0–16.0)
MCH: 29.2 pg (ref 26.0–34.0)
MCHC: 33.6 g/dL (ref 32.0–36.0)
MCV: 86.9 fL (ref 80.0–100.0)
Platelets: 272 10*3/uL (ref 150–440)
RBC: 4.05 MIL/uL (ref 3.80–5.20)
RDW: 14.9 % — AB (ref 11.5–14.5)
WBC: 4.6 10*3/uL (ref 3.6–11.0)

## 2016-03-17 ENCOUNTER — Ambulatory Visit
Admission: RE | Admit: 2016-03-17 | Discharge: 2016-03-17 | Disposition: A | Discharge status: Home / Self Care | Payer: PPO | Source: Ambulatory Visit | Attending: Radiation Oncology | Admitting: Radiation Oncology

## 2016-03-17 DIAGNOSIS — Z510 Encounter for antineoplastic radiation therapy: Secondary | ICD-10-CM | POA: Diagnosis not present

## 2016-03-18 ENCOUNTER — Ambulatory Visit
Admission: RE | Admit: 2016-03-18 | Discharge: 2016-03-18 | Disposition: A | Discharge status: Home / Self Care | Payer: PPO | Source: Ambulatory Visit | Attending: Radiation Oncology | Admitting: Radiation Oncology

## 2016-03-18 DIAGNOSIS — Z510 Encounter for antineoplastic radiation therapy: Secondary | ICD-10-CM | POA: Diagnosis not present

## 2016-03-18 DIAGNOSIS — C50511 Malignant neoplasm of lower-outer quadrant of right female breast: Secondary | ICD-10-CM | POA: Diagnosis not present

## 2016-03-18 DIAGNOSIS — Z170 Estrogen receptor positive status [ER+]: Secondary | ICD-10-CM | POA: Diagnosis not present

## 2016-03-19 ENCOUNTER — Ambulatory Visit
Admission: RE | Admit: 2016-03-19 | Discharge: 2016-03-19 | Disposition: A | Discharge status: Home / Self Care | Payer: PPO | Source: Ambulatory Visit | Attending: Radiation Oncology | Admitting: Radiation Oncology

## 2016-03-19 DIAGNOSIS — Z510 Encounter for antineoplastic radiation therapy: Secondary | ICD-10-CM | POA: Diagnosis not present

## 2016-03-22 ENCOUNTER — Inpatient Hospital Stay: Payer: PPO

## 2016-03-22 ENCOUNTER — Ambulatory Visit
Admission: RE | Admit: 2016-03-22 | Discharge: 2016-03-22 | Disposition: A | Discharge status: Home / Self Care | Payer: PPO | Source: Ambulatory Visit | Attending: Radiation Oncology | Admitting: Radiation Oncology

## 2016-03-22 DIAGNOSIS — C50511 Malignant neoplasm of lower-outer quadrant of right female breast: Secondary | ICD-10-CM | POA: Diagnosis not present

## 2016-03-22 DIAGNOSIS — Z510 Encounter for antineoplastic radiation therapy: Secondary | ICD-10-CM | POA: Diagnosis not present

## 2016-03-22 DIAGNOSIS — Z170 Estrogen receptor positive status [ER+]: Secondary | ICD-10-CM | POA: Diagnosis not present

## 2016-03-23 ENCOUNTER — Ambulatory Visit
Admission: RE | Admit: 2016-03-23 | Discharge: 2016-03-23 | Disposition: A | Discharge status: Home / Self Care | Payer: PPO | Source: Ambulatory Visit | Attending: Radiation Oncology | Admitting: Radiation Oncology

## 2016-03-23 ENCOUNTER — Other Ambulatory Visit: Payer: Self-pay | Admitting: *Deleted

## 2016-03-23 DIAGNOSIS — Z510 Encounter for antineoplastic radiation therapy: Secondary | ICD-10-CM | POA: Diagnosis not present

## 2016-03-23 MED ORDER — SILVER SULFADIAZINE 1 % EX CREA: 1.0000 "application " | TOPICAL_CREAM | Freq: Two times a day (BID) | CUTANEOUS | 2 refills | Status: DC

## 2016-03-24 ENCOUNTER — Ambulatory Visit
Admission: RE | Admit: 2016-03-24 | Discharge: 2016-03-24 | Disposition: A | Discharge status: Home / Self Care | Payer: PPO | Source: Ambulatory Visit | Attending: Radiation Oncology | Admitting: Radiation Oncology

## 2016-03-24 DIAGNOSIS — Z510 Encounter for antineoplastic radiation therapy: Secondary | ICD-10-CM | POA: Diagnosis not present

## 2016-03-25 ENCOUNTER — Ambulatory Visit: Payer: PPO

## 2016-03-25 ENCOUNTER — Ambulatory Visit
Admission: RE | Admit: 2016-03-25 | Discharge: 2016-03-25 | Disposition: A | Discharge status: Home / Self Care | Payer: PPO | Source: Ambulatory Visit | Attending: Radiation Oncology | Admitting: Radiation Oncology

## 2016-03-25 DIAGNOSIS — Z510 Encounter for antineoplastic radiation therapy: Secondary | ICD-10-CM | POA: Diagnosis not present

## 2016-03-26 ENCOUNTER — Ambulatory Visit
Admission: RE | Admit: 2016-03-26 | Discharge: 2016-03-26 | Disposition: A | Discharge status: Home / Self Care | Payer: PPO | Source: Ambulatory Visit | Attending: Radiation Oncology | Admitting: Radiation Oncology

## 2016-03-26 DIAGNOSIS — Z510 Encounter for antineoplastic radiation therapy: Secondary | ICD-10-CM | POA: Diagnosis not present

## 2016-03-29 ENCOUNTER — Inpatient Hospital Stay: Payer: PPO

## 2016-03-29 ENCOUNTER — Ambulatory Visit
Admission: RE | Admit: 2016-03-29 | Discharge: 2016-03-29 | Disposition: A | Discharge status: Home / Self Care | Payer: PPO | Source: Ambulatory Visit | Attending: Radiation Oncology | Admitting: Radiation Oncology

## 2016-03-29 DIAGNOSIS — Z170 Estrogen receptor positive status [ER+]: Secondary | ICD-10-CM | POA: Diagnosis not present

## 2016-03-29 DIAGNOSIS — Z510 Encounter for antineoplastic radiation therapy: Secondary | ICD-10-CM | POA: Diagnosis not present

## 2016-03-29 DIAGNOSIS — C50511 Malignant neoplasm of lower-outer quadrant of right female breast: Secondary | ICD-10-CM

## 2016-03-29 LAB — CBC
HCT: 33.7 % — ABNORMAL LOW (ref 35.0–47.0)
HEMOGLOBIN: 11.6 g/dL — AB (ref 12.0–16.0)
MCH: 29.7 pg (ref 26.0–34.0)
MCHC: 34.4 g/dL (ref 32.0–36.0)
MCV: 86.5 fL (ref 80.0–100.0)
Platelets: 270 10*3/uL (ref 150–440)
RBC: 3.9 MIL/uL (ref 3.80–5.20)
RDW: 15.4 % — AB (ref 11.5–14.5)
WBC: 7 10*3/uL (ref 3.6–11.0)

## 2016-03-30 ENCOUNTER — Ambulatory Visit
Admission: RE | Admit: 2016-03-30 | Discharge: 2016-03-30 | Disposition: A | Discharge status: Home / Self Care | Payer: PPO | Source: Ambulatory Visit | Attending: Radiation Oncology | Admitting: Radiation Oncology

## 2016-03-30 DIAGNOSIS — Z510 Encounter for antineoplastic radiation therapy: Secondary | ICD-10-CM | POA: Diagnosis not present

## 2016-03-31 ENCOUNTER — Ambulatory Visit
Admission: RE | Admit: 2016-03-31 | Discharge: 2016-03-31 | Disposition: A | Discharge status: Home / Self Care | Payer: PPO | Source: Ambulatory Visit | Attending: Radiation Oncology | Admitting: Radiation Oncology

## 2016-03-31 DIAGNOSIS — Z510 Encounter for antineoplastic radiation therapy: Secondary | ICD-10-CM | POA: Diagnosis not present

## 2016-04-01 ENCOUNTER — Ambulatory Visit
Admission: RE | Admit: 2016-04-01 | Discharge: 2016-04-01 | Disposition: A | Discharge status: Home / Self Care | Payer: PPO | Source: Ambulatory Visit | Attending: Radiation Oncology | Admitting: Radiation Oncology

## 2016-04-01 ENCOUNTER — Ambulatory Visit: Payer: PPO | Admitting: Podiatry

## 2016-04-01 DIAGNOSIS — Z510 Encounter for antineoplastic radiation therapy: Secondary | ICD-10-CM | POA: Diagnosis not present

## 2016-04-01 DIAGNOSIS — M84374A Stress fracture, right foot, initial encounter for fracture: Secondary | ICD-10-CM | POA: Diagnosis not present

## 2016-04-01 DIAGNOSIS — M2142 Flat foot [pes planus] (acquired), left foot: Secondary | ICD-10-CM | POA: Diagnosis not present

## 2016-04-01 DIAGNOSIS — M2141 Flat foot [pes planus] (acquired), right foot: Secondary | ICD-10-CM | POA: Diagnosis not present

## 2016-04-02 ENCOUNTER — Ambulatory Visit
Admission: RE | Admit: 2016-04-02 | Discharge: 2016-04-02 | Disposition: A | Discharge status: Home / Self Care | Payer: PPO | Source: Ambulatory Visit | Attending: Radiation Oncology | Admitting: Radiation Oncology

## 2016-04-02 DIAGNOSIS — I340 Nonrheumatic mitral (valve) insufficiency: Secondary | ICD-10-CM | POA: Diagnosis not present

## 2016-04-02 DIAGNOSIS — I10 Essential (primary) hypertension: Secondary | ICD-10-CM | POA: Diagnosis not present

## 2016-04-02 DIAGNOSIS — I272 Pulmonary hypertension, unspecified: Secondary | ICD-10-CM | POA: Diagnosis not present

## 2016-04-02 DIAGNOSIS — I493 Ventricular premature depolarization: Secondary | ICD-10-CM | POA: Diagnosis not present

## 2016-04-02 DIAGNOSIS — Z510 Encounter for antineoplastic radiation therapy: Secondary | ICD-10-CM | POA: Diagnosis not present

## 2016-04-02 DIAGNOSIS — G4733 Obstructive sleep apnea (adult) (pediatric): Secondary | ICD-10-CM | POA: Diagnosis not present

## 2016-04-02 DIAGNOSIS — R001 Bradycardia, unspecified: Secondary | ICD-10-CM | POA: Diagnosis not present

## 2016-04-02 DIAGNOSIS — Z9989 Dependence on other enabling machines and devices: Secondary | ICD-10-CM | POA: Diagnosis not present

## 2016-04-05 ENCOUNTER — Ambulatory Visit (INDEPENDENT_AMBULATORY_CARE_PROVIDER_SITE_OTHER): Payer: PPO | Admitting: Podiatry

## 2016-04-05 ENCOUNTER — Encounter: Payer: Self-pay | Admitting: Podiatry

## 2016-04-05 ENCOUNTER — Ambulatory Visit
Admission: RE | Admit: 2016-04-05 | Discharge: 2016-04-05 | Disposition: A | Discharge status: Home / Self Care | Payer: PPO | Source: Ambulatory Visit | Attending: Radiation Oncology | Admitting: Radiation Oncology

## 2016-04-05 ENCOUNTER — Ambulatory Visit: Payer: PPO

## 2016-04-05 DIAGNOSIS — Z170 Estrogen receptor positive status [ER+]: Secondary | ICD-10-CM | POA: Diagnosis not present

## 2016-04-05 DIAGNOSIS — B351 Tinea unguium: Secondary | ICD-10-CM

## 2016-04-05 DIAGNOSIS — M129 Arthropathy, unspecified: Secondary | ICD-10-CM

## 2016-04-05 DIAGNOSIS — C50511 Malignant neoplasm of lower-outer quadrant of right female breast: Secondary | ICD-10-CM | POA: Diagnosis not present

## 2016-04-05 DIAGNOSIS — M79676 Pain in unspecified toe(s): Secondary | ICD-10-CM | POA: Diagnosis not present

## 2016-04-05 DIAGNOSIS — Z510 Encounter for antineoplastic radiation therapy: Secondary | ICD-10-CM | POA: Diagnosis not present

## 2016-04-05 MED ORDER — MELOXICAM 15 MG PO TABS: 15.0000 mg | ORAL_TABLET | Freq: Every day | ORAL | 1 refills | Status: DC

## 2016-04-05 NOTE — Progress Notes (Signed)
Complaint:  Visit Type: Patient returns to my office for continued preventative foot care services. Complaint: Patient states" my nails have grown long and thick and become painful to walk and wear shoes" Patient has been treated for stress fracture both feet with braces.  She is having difficulty with braces.  Told her to return to [facility] for evaluation of braces. The patient presents for preventative foot care services. No changes to ROS  Podiatric Exam: Vascular: dorsalis pedis and posterior tibial pulses are palpable bilateral. Capillary return is immediate. Temperature gradient is WNL. Skin turgor WNL  Sensorium: Normal Semmes Weinstein monofilament test. Normal tactile sensation bilaterally. Nail Exam: Pt has thick disfigured discolored nails with subungual debris noted bilateral entire nail hallux through fifth toenails Ulcer Exam: There is no evidence of ulcer or pre-ulcerative changes or infection. Orthopedic Exam: Muscle tone and strength are WNL. No limitations in general ROM. No crepitus or effusions noted. Foot type and digits show no abnormalities. Bony prominences are unremarkable.Liz-Frank  Arthritis  B/L.  Severe HAV with contracted digits  B/L Skin: No Porokeratosis. No infection or ulcers  Diagnosis:  Onychomycosis, , Pain in right toe, pain in left toes  Treatment & Plan Procedures and Treatment: Consent by patient was obtained for treatment procedures. The patient understood the discussion of treatment and procedures well. All questions were answered thoroughly reviewed. Debridement of mycotic and hypertrophic toenails, 1 through 5 bilateral and clearing of subungual debris. No ulceration, no infection noted. Prescribe Mobic 15 mg.  # 30. Return Visit-Office Procedure: Patient instructed to return to the office for a follow up visit 3 months for continued evaluation and treatment.    Gardiner Barefoot DPM

## 2016-04-06 ENCOUNTER — Ambulatory Visit: Payer: PPO

## 2016-04-07 ENCOUNTER — Ambulatory Visit: Payer: PPO

## 2016-04-19 DIAGNOSIS — Z853 Personal history of malignant neoplasm of breast: Secondary | ICD-10-CM | POA: Diagnosis not present

## 2016-04-19 DIAGNOSIS — I10 Essential (primary) hypertension: Secondary | ICD-10-CM | POA: Diagnosis not present

## 2016-04-19 DIAGNOSIS — K648 Other hemorrhoids: Secondary | ICD-10-CM | POA: Diagnosis not present

## 2016-04-19 DIAGNOSIS — R7303 Prediabetes: Secondary | ICD-10-CM | POA: Diagnosis not present

## 2016-04-22 DIAGNOSIS — R141 Gas pain: Secondary | ICD-10-CM | POA: Diagnosis not present

## 2016-04-22 DIAGNOSIS — K625 Hemorrhage of anus and rectum: Secondary | ICD-10-CM | POA: Diagnosis not present

## 2016-04-22 DIAGNOSIS — R194 Change in bowel habit: Secondary | ICD-10-CM | POA: Diagnosis not present

## 2016-04-26 ENCOUNTER — Ambulatory Visit: Payer: PPO | Admitting: Radiation Oncology

## 2016-04-26 ENCOUNTER — Inpatient Hospital Stay: Payer: PPO | Admitting: Oncology

## 2016-04-27 NOTE — Progress Notes (Signed)
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 Low risk Mammoprint.  INTERVAL HISTORY: Patient returns to clinic today for further evaluation and initiation of an aromatase inhibitor. She completed her XRT without significant side effects. She currently feels well and is asymptomatic. She has no neurological complaints. She denies any recent fevers or illnesses. She has a good appetite and denies weight loss. She has no chest pain or shortness of breath. She denies any nausea, vomiting, constipation, or diarrhea. She has no urinary complaints. Patient feels at her baseline and offers no specific complaints today.  REVIEW OF SYSTEMS:   Review of Systems  Constitutional: Negative.  Negative for fever, malaise/fatigue and weight loss.  Respiratory: Negative.  Negative for cough and shortness of breath.   Cardiovascular: Negative.  Negative for chest pain and leg swelling.  Gastrointestinal: Negative.  Negative for abdominal pain.  Genitourinary: Negative.   Musculoskeletal: Negative.   Neurological: Negative.  Negative for weakness.  Psychiatric/Behavioral: Negative.  The patient is not nervous/anxious.     As per HPI. Otherwise, a complete review of systems is negative.  PAST MEDICAL HISTORY: Past Medical History:  Diagnosis Date  . Anemia   . Arthritis   . Complication of anesthesia    has difficulty last surgery intubating  . Depression   . GERD (gastroesophageal reflux disease)   . Hypertension   . Irregular heart beat   . Pre-diabetes   . Reflux   . Sleep apnea     PAST SURGICAL HISTORY: Past Surgical History:  Procedure Laterality Date  . APPENDECTOMY    . BREAST  BIOPSY Right 11/26/2015   path pending  . BREAST CYST ASPIRATION    . CHOLECYSTECTOMY    . dental implants    . FOOT SURGERY Right   . PARTIAL MASTECTOMY WITH NEEDLE LOCALIZATION Right 01/02/2016   Procedure: PARTIAL MASTECTOMY WITH NEEDLE LOCALIZATION;  Surgeon: Leonie Green, MD;  Location: [facility] ORS;  Service: General;  Laterality: Right;  . REPLACEMENT TOTAL KNEE Right   . SENTINEL NODE BIOPSY Right 01/02/2016   Procedure: SENTINEL NODE BIOPSY;  Surgeon: Leonie Green, MD;  Location: [facility] ORS;  Service: General;  Laterality: Right;  . TONSILLECTOMY      FAMILY HISTORY: Family History  Problem Relation Age of Onset  . GI Bleed Mother   . Diabetes Father   . Congestive Heart Failure Father   . Breast cancer Neg Hx     ADVANCED DIRECTIVES (Y/N):  N  HEALTH MAINTENANCE: Social History  Substance Use Topics  . Smoking status: Former Research scientist (life sciences)  . Smokeless tobacco: Never Used  . Alcohol use 4.2 oz/week    7 Glasses of wine per week     Colonoscopy:  PAP:  Bone density:  Lipid panel:  Allergies  Allergen Reactions  . Cleocin [Clindamycin Hcl] Swelling, Rash and Other (See Comments)    Tongue, lip swelling Tongue, lip swelling Tongue, lip swelling  . Pen Vk [Penicillin V] Swelling, Rash and Other (See Comments)    Tongue, lip swelling Funny feeling on the tongue  Has patient had a PCN reaction causing immediate rash, facial/tongue/throat swelling, SOB or lightheadedness with hypotension:Yes Has patient had a PCN reaction causing severe rash involving mucus membranes  or skin necrosis:No Has patient had a PCN reaction that required hospitalization:No Has patient had a PCN reaction occurring within the last 10 years:No If all of the above answers are "NO", then may proceed with Cephalosporin use.     Current Outpatient Prescriptions  Medication Sig Dispense Refill  . acetaminophen (TYLENOL) 325 MG tablet Take 650 mg by mouth every 6 (six) hours as needed.      Marland Kitchen amLODipine (NORVASC) 2.5 MG tablet Take 2.5 mg by mouth daily.  2  . Calcium-Magnesium-Vitamin D (SUPER CAL-MAG-D PO) Take 2 tablets by mouth at bedtime. OsteoMatrix 1000 mg elemental calcium-400 mg magnesium-600 I.U. Vitamin D3    . cephALEXin (KEFLEX) 500 MG capsule Take 2,000 mg by mouth as directed. Prior to dental work.     . citalopram (CELEXA) 10 MG tablet Take 5 mg by mouth every other day. In the evening    . COCONUT OIL PO Take 1 capsule by mouth 2 (two) times daily.    . fluticasone (FLONASE) 50 MCG/ACT nasal spray     . Glycerin-Hypromellose-PEG 400 ([facility] DRY EYE RELIEF) 0.2-0.2-1 % SOLN Place 1-2 drops into both eyes 2 (two) times daily.    Marland Kitchen HYDROcodone-acetaminophen (NORCO) 5-325 MG tablet Take 1-2 tablets by mouth every 4 (four) hours as needed for moderate pain. 12 tablet 0  . ibuprofen (ADVIL,MOTRIN) 200 MG tablet Take 200 mg by mouth every 8 (eight) hours as needed (for pain.).    Marland Kitchen losartan (COZAAR) 100 MG tablet Take 100 mg by mouth at bedtime.     . meclizine (ANTIVERT) 12.5 MG tablet Take 12.5-25 mg by mouth 3 (three) times daily as needed for dizziness.  0  . meloxicam (MOBIC) 15 MG tablet Take 15 mg by mouth daily.     . meloxicam (MOBIC) 15 MG tablet Take 1 tablet (15 mg total) by mouth daily. 30 tablet 1  . metoprolol succinate (TOPROL-XL) 25 MG 24 hr tablet Take 25 mg by mouth daily.    . Multiple Vitamin (MULTIVITAMIN WITH MINERALS) TABS tablet Take 1 tablet by mouth at bedtime.    . NON FORMULARY Take 1 capsule by mouth at bedtime. OmegaGuard (active ingredients) Fish oil 1200 mg-Omega 3 667 mg-EPA 363 MG-DHA 240 MG    . NONFORMULARY OR COMPOUNDED ITEM Take 1-2 capsules by mouth 3 (three) times daily. LunaRich X (Lunasin-soy peptide) 2 capsule in the morning, 2 capsules mid-day, and 1 capsule at bedtime.    . Nutritional Supplements (NUTRITIONAL SUPPLEMENT PO) Take 1 capsule by mouth daily. Protandim Nrf2 Synergizer Milk thistle (Silybum marianum) extract (225  mg) Bacopa (Bacopa monnieri) extract (150 mg) Ashwagandha (Withania somnifera) root (150 mg) Green tea (Camellia sinensis) extract (75 mg) Turmeric (Curcuma longa) extract (75 mg)    . omeprazole (PRILOSEC) 20 MG capsule Take 20 mg by mouth daily.     Marland Kitchen OVER THE COUNTER MEDICATION Take 1 capsule by mouth at bedtime. Joint Health Complex  (active ingredients) 60 mg Vitamin C-1.5 mg Zinc-0.2 mg Copper-0.2 mg Manganese-1500 mg Glucosamine-100 mg Boswellia     . polyethylene glycol powder (GLYCOLAX/MIRALAX) powder Take as directed for colonic prep.    . Probiotic Product (PROBIOTIC PO) Take 1 capsule by mouth daily.    . silver sulfADIAZINE (SILVADENE) 1 % cream Apply 1 application topically 2 (two) times daily. 50 g 2  . traMADol (ULTRAM) 50 MG tablet Take 50 mg by mouth every 6 (six) hours as needed (for pain.).     Marland Kitchen letrozole [facility]) 2.5  MG tablet Take 1 tablet (2.5 mg total) by mouth daily. 30 tablet 11   No current facility-administered medications for this visit.     OBJECTIVE: Vitals:   04/29/16 0928  BP: 123/69  Pulse: 63  Temp: 97.9 F (36.6 C)     Body mass index is 32.35 kg/m.    ECOG FS:0 - Asymptomatic  General: Well-developed, well-nourished, no acute distress. Eyes: Pink conjunctiva, anicteric sclera. Breasts: Patient requested exam be deferred today. Lungs: Clear to auscultation bilaterally. Heart: Regular rate and rhythm. No rubs, murmurs, or gallops. Abdomen: Soft, nontender, nondistended. No organomegaly noted, normoactive bowel sounds. Musculoskeletal: No edema, cyanosis, or clubbing. Neuro: Alert, answering all questions appropriately. Cranial nerves grossly intact. Skin: No rashes or petechiae noted. Psych: Normal affect.   LAB RESULTS:  Lab Results  Component Value Date   NA 133 (L) 12/22/2015   K 4.0 12/22/2015   CL 99 (L) 12/22/2015   CO2 27 12/22/2015   GLUCOSE 92 12/22/2015   BUN 27 (H) 12/22/2015   CREATININE 0.89 12/22/2015   CALCIUM 9.1  12/22/2015   PROT 6.7 12/22/2015   ALBUMIN 3.8 12/22/2015   AST 24 12/22/2015   ALT 23 12/22/2015   ALKPHOS 71 12/22/2015   BILITOT 0.4 12/22/2015   GFRNONAA >60 12/22/2015   GFRAA >60 12/22/2015    Lab Results  Component Value Date   WBC 7.0 03/29/2016   NEUTROABS 4.3 12/22/2015   HGB 11.6 (L) 03/29/2016   HCT 33.7 (L) 03/29/2016   MCV 86.5 03/29/2016   PLT 270 03/29/2016     STUDIES: No results found.  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 Given her low risk Mammoprint results, she did not require chemotherapy. Have recommended letrozole for total 5 years completing in March 2023, but patient has already indicated she likely will not take it. We will get a baseline bone marrow density in the next 1-2 weeks. Return to clinic in 3 months for further evaluation.   Approximately 30 minutes was spent in discussion of which came 50% was consultation.  Patient expressed understanding and was in agreement with this plan. She also understands that She can call clinic at any time with any questions, concerns, or complaints.   Cancer Staging 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 [facility]) Staging form: Breast, AJCC 7th Edition - Clinical stage from 12/07/2015: Stage IA (T1b, N0, M0) - Signed by Lloyd Huger, MD on 12/07/2015   Lloyd Huger, MD   04/29/2016 10:13 AM

## 2016-04-28 DIAGNOSIS — K648 Other hemorrhoids: Secondary | ICD-10-CM | POA: Diagnosis not present

## 2016-04-29 ENCOUNTER — Inpatient Hospital Stay: Payer: PPO | Attending: Oncology | Admitting: Oncology

## 2016-04-29 ENCOUNTER — Encounter: Payer: Self-pay | Admitting: Oncology

## 2016-04-29 ENCOUNTER — Ambulatory Visit
Admission: RE | Admit: 2016-04-29 | Discharge: 2016-04-29 | Disposition: A | Discharge status: Home / Self Care | Payer: PPO | Source: Ambulatory Visit | Attending: Radiation Oncology | Admitting: Radiation Oncology

## 2016-04-29 VITALS — BP 123/69 | HR 63 | Temp 97.9°F | Wt 182.6 lb

## 2016-04-29 DIAGNOSIS — I10 Essential (primary) hypertension: Secondary | ICD-10-CM | POA: Diagnosis not present

## 2016-04-29 DIAGNOSIS — C50511 Malignant neoplasm of lower-outer quadrant of right female breast: Secondary | ICD-10-CM

## 2016-04-29 DIAGNOSIS — F329 Major depressive disorder, single episode, unspecified: Secondary | ICD-10-CM | POA: Insufficient documentation

## 2016-04-29 DIAGNOSIS — Z170 Estrogen receptor positive status [ER+]: Secondary | ICD-10-CM

## 2016-04-29 DIAGNOSIS — G4730 Sleep apnea, unspecified: Secondary | ICD-10-CM

## 2016-04-29 DIAGNOSIS — Z87891 Personal history of nicotine dependence: Secondary | ICD-10-CM | POA: Diagnosis not present

## 2016-04-29 DIAGNOSIS — Z923 Personal history of irradiation: Secondary | ICD-10-CM | POA: Insufficient documentation

## 2016-04-29 DIAGNOSIS — Z79811 Long term (current) use of aromatase inhibitors: Secondary | ICD-10-CM | POA: Diagnosis not present

## 2016-04-29 DIAGNOSIS — Z79899 Other long term (current) drug therapy: Secondary | ICD-10-CM | POA: Diagnosis not present

## 2016-04-29 DIAGNOSIS — Z853 Personal history of malignant neoplasm of breast: Secondary | ICD-10-CM | POA: Diagnosis not present

## 2016-04-29 DIAGNOSIS — Z9049 Acquired absence of other specified parts of digestive tract: Secondary | ICD-10-CM

## 2016-04-29 DIAGNOSIS — K219 Gastro-esophageal reflux disease without esophagitis: Secondary | ICD-10-CM | POA: Insufficient documentation

## 2016-04-29 DIAGNOSIS — M129 Arthropathy, unspecified: Secondary | ICD-10-CM | POA: Diagnosis not present

## 2016-04-29 MED ORDER — LETROZOLE 2.5 MG PO TABS: 2.5000 mg | ORAL_TABLET | Freq: Every day | ORAL | 11 refills | Status: DC

## 2016-04-29 NOTE — Progress Notes (Signed)
Radiation Oncology Follow up Note  Name: Courtney Lynn   Date:   04/29/2016 MRN:  [id number] DOB: [birth year]    This 76 y.o. female presents to the clinic today for one-month follow-up status post whole breast radiation to her right breast for stage I ER/PR positive HER-2/neu negative invasive mammary carcinoma.  REFERRING PROVIDER: Glendon Axe, MD  HPI: Patient is a 76 year old female now out 1 month having completed radiation therapy to her right breast for a stage Ia ER/PR positive HER-2/neu negative invasive mammary carcinoma. Seen today in routine follow-up she is doing well. She specifically denies breast tenderness cough or bone pain.. She had a MammaPrint showing low risk of recurrence did not receive chemotherapy. She is had recommendation for letrozole although the patient is refusing that.  COMPLICATIONS OF TREATMENT: none  FOLLOW UP COMPLIANCE: keeps appointments   PHYSICAL EXAM:  There were no vitals taken for this visit. Lungs are clear to A&P cardiac examination essentially unremarkable with regular rate and rhythm. No dominant mass or nodularity is noted in either breast in 2 positions examined. Incision is well-healed. No axillary or supraclavicular adenopathy is appreciated. Cosmetic result is excellent. Well-developed well-nourished patient in NAD. HEENT reveals PERLA, EOMI, discs not visualized.  Oral cavity is clear. No oral mucosal lesions are identified. Neck is clear without evidence of cervical or supraclavicular adenopathy. Lungs are clear to A&P. Cardiac examination is essentially unremarkable with regular rate and rhythm without murmur rub or thrill. Abdomen is benign with no organomegaly or masses noted. Motor sensory and DTR levels are equal and symmetric in the upper and lower extremities. Cranial nerves II through XII are grossly intact. Proprioception is intact. No peripheral adenopathy or edema is identified. No motor or sensory levels are noted. Crude visual  fields are within normal range.  RADIOLOGY RESULTS: No current films for review  PLAN: At this time patient is doing well no specific side effects or complaints after whole breast radiation. I am please were overall progress. She was offered letrozole but is refusing that. I've offered her a follow-up visit although she states she wants to see only one or 2 doctors. I will leave it up to her to cancel her follow-up visit. Otherwise patient knows to call with any concerns.  I would like to take this opportunity to thank you for allowing me to participate in the care of your patient.Armstead Peaks., MD

## 2016-04-30 NOTE — Progress Notes (Signed)
  Oncology Nurse Navigator Documentation  Navigator Location: [facility]Med Onc (04/29/16 1416)   )Navigator Encounter Type: 6 month;[facility] Follow-up;Follow-up Appt (04/29/16 1416)                     Patient Visit Type: Follow-up;RadOnc;MedOnc (04/29/16 1416) Treatment Phase: Post-Tx Follow-up (04/29/16 1416) Barriers/Navigation Needs: Financial (04/29/16 1416)   Interventions: Referrals (04/29/16 1416)            Acuity: Level 2 (04/29/16 1416)   Acuity Level 2: Ongoing guidance and education throughout treatment as needed;Referrals such as genetics, survivorship (04/29/16 1416)     Time Spent with Patient: 45 (04/29/16 1416)   Met patient at follow-up [facility] visits.  Patient has questions related to billing. Discussed with Elliot Gault , financial Advocate in [facility].   Patient to call her with further questions or needs.

## 2016-05-04 DIAGNOSIS — Z0000 Encounter for general adult medical examination without abnormal findings: Secondary | ICD-10-CM | POA: Diagnosis not present

## 2016-05-04 DIAGNOSIS — M898X9 Other specified disorders of bone, unspecified site: Secondary | ICD-10-CM | POA: Diagnosis not present

## 2016-05-04 DIAGNOSIS — Z0389 Encounter for observation for other suspected diseases and conditions ruled out: Secondary | ICD-10-CM | POA: Diagnosis not present

## 2016-05-31 DIAGNOSIS — Z853 Personal history of malignant neoplasm of breast: Secondary | ICD-10-CM | POA: Diagnosis not present

## 2016-05-31 DIAGNOSIS — K648 Other hemorrhoids: Secondary | ICD-10-CM | POA: Diagnosis not present

## 2016-06-01 ENCOUNTER — Telehealth: Payer: Self-pay | Admitting: *Deleted

## 2016-06-01 ENCOUNTER — Ambulatory Visit
Admission: RE | Admit: 2016-06-01 | Discharge: 2016-06-01 | Disposition: A | Discharge status: Home / Self Care | Payer: PPO | Source: Ambulatory Visit | Attending: Oncology | Admitting: Oncology

## 2016-06-01 DIAGNOSIS — C50511 Malignant neoplasm of lower-outer quadrant of right female breast: Secondary | ICD-10-CM

## 2016-06-01 DIAGNOSIS — G4733 Obstructive sleep apnea (adult) (pediatric): Secondary | ICD-10-CM | POA: Diagnosis not present

## 2016-06-01 NOTE — Telephone Encounter (Signed)
Patient had a bone density at [facility] 04/16/15, does she need this one? Patient afraid her insurance will not cover it. Per VO Dr Grayland Ormond, it can be cancelled. Kristi notified

## 2016-07-12 ENCOUNTER — Ambulatory Visit (INDEPENDENT_AMBULATORY_CARE_PROVIDER_SITE_OTHER): Payer: PPO | Admitting: Podiatry

## 2016-07-12 DIAGNOSIS — B351 Tinea unguium: Secondary | ICD-10-CM

## 2016-07-12 DIAGNOSIS — M79676 Pain in unspecified toe(s): Secondary | ICD-10-CM

## 2016-07-12 DIAGNOSIS — M2010 Hallux valgus (acquired), unspecified foot: Secondary | ICD-10-CM

## 2016-07-12 NOTE — Progress Notes (Signed)
Complaint:  Visit Type: Patient returns to my office for continued preventative foot care services. Complaint: Patient states" my nails have grown long and thick and become painful to walk and wear shoes" .  She says she is developing pain and feels something is moving on the left foot because she uses braces.  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   She also has pain at site of bunion right foot. She presents for continued foot care services.  Podiatric Exam: Vascular: dorsalis pedis and posterior tibial pulses are palpable bilateral. Capillary return is immediate. Temperature gradient is WNL. Skin turgor WNL  Sensorium: Normal Semmes Weinstein monofilament test. Normal tactile sensation bilaterally. Nail Exam: Pt has thick disfigured discolored nails with subungual debris noted bilateral entire nail hallux through fifth toenails Ulcer Exam: There is no evidence of ulcer or pre-ulcerative changes or infection. Orthopedic Exam: Muscle tone and strength are WNL. No limitations in general ROM. No crepitus or effusions noted. Foot type and digits show no abnormalities. Bony prominences are unremarkable.Liz-Frank  Arthritis  B/L.  Severe HAV with contracted digits  B/L.  Right greater than left bunion. Skin: No Porokeratosis. No infection or ulcers  Diagnosis:  Onychomycosis, , Pain in right toe, pain in left toes,  HAV left foot.  Treatment & Plan Procedures and Treatment: Consent by patient was obtained for treatment procedures. The patient understood the discussion of treatment and procedures well. All questions were answered thoroughly reviewed. Debridement of mycotic and hypertrophic toenails, 1 through 5 bilateral and clearing of subungual debris. No ulceration, no infection noted. Provided padding for bunion right foot. Return Visit-Office Procedure: Patient instructed to return to the office for a follow up visit 3 months for continued evaluation and treatment.    Gardiner Barefoot DPM

## 2016-07-28 NOTE — Progress Notes (Signed)
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 Low risk Mammoprint.  INTERVAL HISTORY: Patient returns to clinic today for further evaluation. She completed her XRT without significant side effects. She currently feels well and is asymptomatic. She has been experiencing hot flashes, hair loss and gas pain intermittently.  She has no neurological complaints. She denies any recent fevers or illnesses. She has a good appetite and denies weight loss. She has no chest pain or shortness of breath. She denies any nausea, vomiting, constipation, or diarrhea. She has no urinary complaints. Patient feels at her baseline and offers no specific complaints today.  REVIEW OF SYSTEMS:   Review of Systems  Constitutional: Negative.  Negative for fever, malaise/fatigue and weight loss.  Respiratory: Negative.  Negative for cough and shortness of breath.   Cardiovascular: Negative.  Negative for chest pain and leg swelling.  Gastrointestinal: Positive for abdominal pain.  Genitourinary: Negative.   Musculoskeletal: Negative.   Skin: Negative.  Negative for rash.  Neurological: Positive for sensory change. Negative for weakness.  Psychiatric/Behavioral: Negative.  The patient is not nervous/anxious.     As per HPI. Otherwise, a complete review of systems is negative.  PAST MEDICAL HISTORY: Past Medical History:  Diagnosis Date  . Anemia   . Arthritis   . Complication of anesthesia    has difficulty last surgery intubating  . Depression   . GERD (gastroesophageal reflux disease)   . Hypertension   . Irregular heart beat   . Pre-diabetes   . Reflux   . Sleep apnea     PAST SURGICAL  HISTORY: Past Surgical History:  Procedure Laterality Date  . APPENDECTOMY    . BREAST BIOPSY Right 11/26/2015   path pending  . BREAST CYST ASPIRATION    . CHOLECYSTECTOMY    . dental implants    . FOOT SURGERY Right   . PARTIAL MASTECTOMY WITH NEEDLE LOCALIZATION Right 01/02/2016   Procedure: PARTIAL MASTECTOMY WITH NEEDLE LOCALIZATION;  Surgeon: Leonie Green, MD;  Location: [facility] ORS;  Service: General;  Laterality: Right;  . REPLACEMENT TOTAL KNEE Right   . SENTINEL NODE BIOPSY Right 01/02/2016   Procedure: SENTINEL NODE BIOPSY;  Surgeon: Leonie Green, MD;  Location: [facility] ORS;  Service: General;  Laterality: Right;  . TONSILLECTOMY      FAMILY HISTORY: Family History  Problem Relation Age of Onset  . GI Bleed Mother   . Diabetes Father   . Congestive Heart Failure Father   . Breast cancer Neg Hx     ADVANCED DIRECTIVES (Y/N):  N  HEALTH MAINTENANCE: Social History  Substance Use Topics  . Smoking status: Former Research scientist (life sciences)  . Smokeless tobacco: Never Used  . Alcohol use 4.2 oz/week    7 Glasses of wine per week     Colonoscopy:  PAP:  Bone density:  Lipid panel:  Allergies  Allergen Reactions  . Cleocin [Clindamycin Hcl] Swelling, Rash and Other (See Comments)    Tongue, lip swelling Tongue, lip swelling Tongue, lip swelling  . Pen Vk [Penicillin V] Swelling, Rash and Other (See Comments)    Tongue, lip swelling Funny feeling on the tongue  Has patient had a PCN reaction causing immediate rash, facial/tongue/throat swelling, SOB or lightheadedness  with hypotension:Yes Has patient had a PCN reaction causing severe rash involving mucus membranes or skin necrosis:No Has patient had a PCN reaction that required hospitalization:No Has patient had a PCN reaction occurring within the last 10 years:No If all of the above answers are "NO", then may proceed with Cephalosporin use.     Current Outpatient Prescriptions  Medication Sig Dispense Refill  .  acetaminophen (TYLENOL) 325 MG tablet Take 650 mg by mouth every 6 (six) hours as needed.    Marland Kitchen amLODipine (NORVASC) 2.5 MG tablet Take 2.5 mg by mouth daily.  2  . Calcium-Magnesium-Vitamin D (SUPER CAL-MAG-D PO) Take 2 tablets by mouth at bedtime. OsteoMatrix 1000 mg elemental calcium-400 mg magnesium-600 I.U. Vitamin D3    . citalopram (CELEXA) 10 MG tablet Take 5 mg by mouth every other day. In the evening    . ibuprofen (ADVIL,MOTRIN) 200 MG tablet Take 200 mg by mouth every 8 (eight) hours as needed (for pain.).    Marland Kitchen letrozole (FEMARA) 2.5 MG tablet Take 1 tablet (2.5 mg total) by mouth daily. 30 tablet 11  . losartan (COZAAR) 100 MG tablet Take 100 mg by mouth at bedtime.     . meloxicam (MOBIC) 15 MG tablet Take 15 mg by mouth daily.     . meloxicam (MOBIC) 15 MG tablet Take 1 tablet (15 mg total) by mouth daily. 30 tablet 1  . metoprolol succinate (TOPROL-XL) 25 MG 24 hr tablet Take 25 mg by mouth daily.    . Multiple Vitamin (MULTIVITAMIN WITH MINERALS) TABS tablet Take 1 tablet by mouth at bedtime.    . NON FORMULARY Take 1 capsule by mouth at bedtime. OmegaGuard (active ingredients) Fish oil 1200 mg-Omega 3 667 mg-EPA 363 MG-DHA 240 MG    . omeprazole (PRILOSEC) 20 MG capsule Take 20 mg by mouth daily.     Marland Kitchen OVER THE COUNTER MEDICATION Take 1 capsule by mouth at bedtime. Joint Health Complex  (active ingredients) 60 mg Vitamin C-1.5 mg Zinc-0.2 mg Copper-0.2 mg Manganese-1500 mg Glucosamine-100 mg Boswellia     . polyethylene glycol powder (GLYCOLAX/MIRALAX) powder Take as directed for colonic prep.    . Probiotic Product (PROBIOTIC PO) Take 1 capsule by mouth daily.    . traMADol (ULTRAM) 50 MG tablet Take 50 mg by mouth every 6 (six) hours as needed (for pain.).     Marland Kitchen cephALEXin (KEFLEX) 500 MG capsule Take 2,000 mg by mouth as directed. Prior to dental work.     . COCONUT OIL PO Take 1 capsule by mouth 2 (two) times daily.    . fluticasone (FLONASE) 50 MCG/ACT nasal spray     .  Glycerin-Hypromellose-PEG 400 ([facility] DRY EYE RELIEF) 0.2-0.2-1 % SOLN Place 1-2 drops into both eyes 2 (two) times daily.    Marland Kitchen HYDROcodone-acetaminophen (NORCO) 5-325 MG tablet Take 1-2 tablets by mouth every 4 (four) hours as needed for moderate pain. (Patient not taking: Reported on 07/29/2016) 12 tablet 0  . meclizine (ANTIVERT) 12.5 MG tablet Take 12.5-25 mg by mouth 3 (three) times daily as needed for dizziness.  0  . NONFORMULARY OR COMPOUNDED ITEM Take 1-2 capsules by mouth 3 (three) times daily. LunaRich X (Lunasin-soy peptide) 2 capsule in the morning, 2 capsules mid-day, and 1 capsule at bedtime.    . Nutritional Supplements (NUTRITIONAL SUPPLEMENT PO) Take 1 capsule by mouth daily. Protandim Nrf2 Synergizer Milk thistle (Silybum marianum) extract (225 mg) Bacopa (Bacopa monnieri) extract (150 mg) Ashwagandha (Withania somnifera) root (150 mg) Nyoka Cowden  tea (Camellia sinensis) extract (75 mg) Turmeric (Curcuma longa) extract (75 mg)    . silver sulfADIAZINE (SILVADENE) 1 % cream Apply 1 application topically 2 (two) times daily. (Patient not taking: Reported on 07/29/2016) 50 g 2   No current facility-administered medications for this visit.     OBJECTIVE: Vitals:   07/29/16 1412  BP: (!) 142/87     Body mass index is 32.95 kg/m.    ECOG FS:0 - Asymptomatic  General: Well-developed, well-nourished, no acute distress. Eyes: Pink conjunctiva, anicteric sclera. Breasts: Patient requested exam be deferred today. Lungs: Clear to auscultation bilaterally. Heart: Regular rate and rhythm. No rubs, murmurs, or gallops. Abdomen: Soft, nontender, nondistended. No organomegaly noted, normoactive bowel sounds. Musculoskeletal: No edema, cyanosis, or clubbing. Neuro: Alert, answering all questions appropriately. Cranial nerves grossly intact. Skin: No rashes or petechiae noted. Psych: Normal affect.   LAB RESULTS:  Lab Results  Component Value Date   NA 133 (L) 12/22/2015   K 4.0  12/22/2015   CL 99 (L) 12/22/2015   CO2 27 12/22/2015   GLUCOSE 92 12/22/2015   BUN 27 (H) 12/22/2015   CREATININE 0.89 12/22/2015   CALCIUM 9.1 12/22/2015   PROT 6.7 12/22/2015   ALBUMIN 3.8 12/22/2015   AST 24 12/22/2015   ALT 23 12/22/2015   ALKPHOS 71 12/22/2015   BILITOT 0.4 12/22/2015   GFRNONAA >60 12/22/2015   GFRAA >60 12/22/2015    Lab Results  Component Value Date   WBC 7.0 03/29/2016   NEUTROABS 4.3 12/22/2015   HGB 11.6 (L) 03/29/2016   HCT 33.7 (L) 03/29/2016   MCV 86.5 03/29/2016   PLT 270 03/29/2016     STUDIES: No results found.  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 Given her low risk Mammoprint results, she did not require chemotherapy. Have recommended letrozole for total 5 years completing in March 2023, but patient has already indicated she likely will not take it. She did not get her baseline bone density. She would like to stop taking her letrozole and be discharged from clinic.   Approximately 30 minutes was spent in discussion of which came 50% was consultation.  Patient expressed understanding and was in agreement with this plan. She also understands that She can call clinic at any time with any questions, concerns, or complaints.   Cancer Staging 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 [facility]) Staging form: Breast, AJCC 7th Edition - Clinical stage from 12/07/2015: Stage IA (T1b, N0, M0) - Signed by Lloyd Huger, MD on 12/07/2015  Faythe Casa, NP   Patient was seen and evaluated independently and I agree with the assessment and plan as dictated above. No follow up has been schedule at patient's request.  Have recommend she continue to get yearly  mammograms which can be ordered by her PCP.  Lloyd Huger, MD 08/01/16 8:40 AM

## 2016-07-29 ENCOUNTER — Inpatient Hospital Stay: Payer: PPO | Attending: Oncology | Admitting: Oncology

## 2016-07-29 VITALS — BP 142/87 | Wt 186.0 lb

## 2016-07-29 DIAGNOSIS — Z170 Estrogen receptor positive status [ER+]: Secondary | ICD-10-CM | POA: Diagnosis not present

## 2016-07-29 DIAGNOSIS — Z87891 Personal history of nicotine dependence: Secondary | ICD-10-CM | POA: Diagnosis not present

## 2016-07-29 DIAGNOSIS — R140 Abdominal distension (gaseous): Secondary | ICD-10-CM | POA: Diagnosis not present

## 2016-07-29 DIAGNOSIS — C50511 Malignant neoplasm of lower-outer quadrant of right female breast: Secondary | ICD-10-CM | POA: Insufficient documentation

## 2016-07-29 DIAGNOSIS — F329 Major depressive disorder, single episode, unspecified: Secondary | ICD-10-CM | POA: Diagnosis not present

## 2016-07-29 DIAGNOSIS — G4730 Sleep apnea, unspecified: Secondary | ICD-10-CM | POA: Diagnosis not present

## 2016-07-29 DIAGNOSIS — I10 Essential (primary) hypertension: Secondary | ICD-10-CM | POA: Diagnosis not present

## 2016-07-29 DIAGNOSIS — Z923 Personal history of irradiation: Secondary | ICD-10-CM | POA: Insufficient documentation

## 2016-07-29 DIAGNOSIS — R232 Flushing: Secondary | ICD-10-CM | POA: Insufficient documentation

## 2016-07-29 DIAGNOSIS — Z79899 Other long term (current) drug therapy: Secondary | ICD-10-CM | POA: Diagnosis not present

## 2016-07-29 DIAGNOSIS — K219 Gastro-esophageal reflux disease without esophagitis: Secondary | ICD-10-CM | POA: Diagnosis not present

## 2016-07-29 NOTE — Progress Notes (Signed)
Patient reports hair thinning and gas pains on letrozole.

## 2016-08-11 DIAGNOSIS — H2511 Age-related nuclear cataract, right eye: Secondary | ICD-10-CM | POA: Diagnosis not present

## 2016-08-18 DIAGNOSIS — Z9989 Dependence on other enabling machines and devices: Secondary | ICD-10-CM | POA: Diagnosis not present

## 2016-08-18 DIAGNOSIS — F325 Major depressive disorder, single episode, in full remission: Secondary | ICD-10-CM | POA: Diagnosis not present

## 2016-08-18 DIAGNOSIS — I341 Nonrheumatic mitral (valve) prolapse: Secondary | ICD-10-CM | POA: Diagnosis not present

## 2016-08-18 DIAGNOSIS — I493 Ventricular premature depolarization: Secondary | ICD-10-CM | POA: Diagnosis not present

## 2016-08-18 DIAGNOSIS — R001 Bradycardia, unspecified: Secondary | ICD-10-CM | POA: Diagnosis not present

## 2016-08-18 DIAGNOSIS — I272 Pulmonary hypertension, unspecified: Secondary | ICD-10-CM | POA: Diagnosis not present

## 2016-08-18 DIAGNOSIS — I10 Essential (primary) hypertension: Secondary | ICD-10-CM | POA: Diagnosis not present

## 2016-08-18 DIAGNOSIS — G4733 Obstructive sleep apnea (adult) (pediatric): Secondary | ICD-10-CM | POA: Diagnosis not present

## 2016-08-18 DIAGNOSIS — I340 Nonrheumatic mitral (valve) insufficiency: Secondary | ICD-10-CM | POA: Diagnosis not present

## 2016-08-18 DIAGNOSIS — K219 Gastro-esophageal reflux disease without esophagitis: Secondary | ICD-10-CM | POA: Diagnosis not present

## 2016-08-18 DIAGNOSIS — R7303 Prediabetes: Secondary | ICD-10-CM | POA: Diagnosis not present

## 2016-08-23 ENCOUNTER — Encounter: Payer: Self-pay | Admitting: *Deleted

## 2016-08-24 ENCOUNTER — Encounter: Payer: Self-pay | Admitting: *Deleted

## 2016-08-24 ENCOUNTER — Encounter
Admission: RE | Disposition: A | Discharge status: Home / Self Care | Payer: Self-pay | Source: Ambulatory Visit | Attending: Gastroenterology

## 2016-08-24 ENCOUNTER — Ambulatory Visit: Payer: PPO | Admitting: Anesthesiology

## 2016-08-24 ENCOUNTER — Ambulatory Visit
Admission: RE | Admit: 2016-08-24 | Discharge: 2016-08-24 | Disposition: A | Discharge status: Home / Self Care | Payer: PPO | Source: Ambulatory Visit | Attending: Gastroenterology | Admitting: Gastroenterology

## 2016-08-24 DIAGNOSIS — Z791 Long term (current) use of non-steroidal anti-inflammatories (NSAID): Secondary | ICD-10-CM | POA: Diagnosis not present

## 2016-08-24 DIAGNOSIS — M1990 Unspecified osteoarthritis, unspecified site: Secondary | ICD-10-CM | POA: Diagnosis not present

## 2016-08-24 DIAGNOSIS — K5900 Constipation, unspecified: Secondary | ICD-10-CM | POA: Diagnosis not present

## 2016-08-24 DIAGNOSIS — F5104 Psychophysiologic insomnia: Secondary | ICD-10-CM | POA: Insufficient documentation

## 2016-08-24 DIAGNOSIS — I493 Ventricular premature depolarization: Secondary | ICD-10-CM | POA: Insufficient documentation

## 2016-08-24 DIAGNOSIS — I10 Essential (primary) hypertension: Secondary | ICD-10-CM | POA: Diagnosis not present

## 2016-08-24 DIAGNOSIS — K640 First degree hemorrhoids: Secondary | ICD-10-CM | POA: Diagnosis not present

## 2016-08-24 DIAGNOSIS — R42 Dizziness and giddiness: Secondary | ICD-10-CM | POA: Diagnosis not present

## 2016-08-24 DIAGNOSIS — D125 Benign neoplasm of sigmoid colon: Secondary | ICD-10-CM | POA: Diagnosis not present

## 2016-08-24 DIAGNOSIS — I341 Nonrheumatic mitral (valve) prolapse: Secondary | ICD-10-CM | POA: Insufficient documentation

## 2016-08-24 DIAGNOSIS — G4730 Sleep apnea, unspecified: Secondary | ICD-10-CM | POA: Diagnosis not present

## 2016-08-24 DIAGNOSIS — Z880 Allergy status to penicillin: Secondary | ICD-10-CM | POA: Diagnosis not present

## 2016-08-24 DIAGNOSIS — D649 Anemia, unspecified: Secondary | ICD-10-CM | POA: Diagnosis not present

## 2016-08-24 DIAGNOSIS — Z853 Personal history of malignant neoplasm of breast: Secondary | ICD-10-CM | POA: Diagnosis not present

## 2016-08-24 DIAGNOSIS — R194 Change in bowel habit: Secondary | ICD-10-CM | POA: Diagnosis not present

## 2016-08-24 DIAGNOSIS — K635 Polyp of colon: Secondary | ICD-10-CM | POA: Diagnosis not present

## 2016-08-24 DIAGNOSIS — R7303 Prediabetes: Secondary | ICD-10-CM | POA: Diagnosis not present

## 2016-08-24 DIAGNOSIS — K625 Hemorrhage of anus and rectum: Secondary | ICD-10-CM | POA: Diagnosis not present

## 2016-08-24 DIAGNOSIS — K219 Gastro-esophageal reflux disease without esophagitis: Secondary | ICD-10-CM | POA: Insufficient documentation

## 2016-08-24 DIAGNOSIS — I272 Pulmonary hypertension, unspecified: Secondary | ICD-10-CM | POA: Diagnosis not present

## 2016-08-24 DIAGNOSIS — F329 Major depressive disorder, single episode, unspecified: Secondary | ICD-10-CM | POA: Diagnosis not present

## 2016-08-24 DIAGNOSIS — E871 Hypo-osmolality and hyponatremia: Secondary | ICD-10-CM | POA: Diagnosis not present

## 2016-08-24 DIAGNOSIS — K648 Other hemorrhoids: Secondary | ICD-10-CM | POA: Diagnosis not present

## 2016-08-24 HISTORY — DX: Personal history of other infectious and parasitic diseases: Z86.19

## 2016-08-24 HISTORY — DX: Hypo-osmolality and hyponatremia: E87.1

## 2016-08-24 HISTORY — DX: Nonrheumatic mitral (valve) prolapse: I34.1

## 2016-08-24 HISTORY — DX: Nonrheumatic mitral (valve) insufficiency: I34.0

## 2016-08-24 HISTORY — DX: Malignant (primary) neoplasm, unspecified: C80.1

## 2016-08-24 HISTORY — DX: Cardiac arrhythmia, unspecified: I49.9

## 2016-08-24 HISTORY — DX: Ventricular premature depolarization: I49.3

## 2016-08-24 HISTORY — DX: Psychophysiologic insomnia: F51.04

## 2016-08-24 HISTORY — PX: COLONOSCOPY WITH PROPOFOL: SHX5780

## 2016-08-24 HISTORY — DX: Pulmonary hypertension, unspecified: I27.20

## 2016-08-24 HISTORY — DX: Dizziness and giddiness: R42

## 2016-08-24 HISTORY — DX: Body mass index (BMI) 37.0-37.9, adult: Z68.37

## 2016-08-24 SURGERY — COLONOSCOPY WITH PROPOFOL
Anesthesia: General

## 2016-08-24 MED ORDER — PROPOFOL 500 MG/50ML IV EMUL
INTRAVENOUS | Status: AC
  Filled 2016-08-24: qty 50

## 2016-08-24 MED ORDER — FENTANYL CITRATE (PF) 100 MCG/2ML IJ SOLN
INTRAMUSCULAR | Status: AC
  Filled 2016-08-24: qty 2

## 2016-08-24 MED ORDER — MIDAZOLAM HCL 2 MG/2ML IJ SOLN
INTRAMUSCULAR | Status: DC | PRN
  Administered 2016-08-24: 2 mg via INTRAVENOUS

## 2016-08-24 MED ORDER — SODIUM CHLORIDE 0.9 % IV SOLN: INTRAVENOUS | Status: DC

## 2016-08-24 MED ORDER — MIDAZOLAM HCL 2 MG/2ML IJ SOLN
INTRAMUSCULAR | Status: AC
  Filled 2016-08-24: qty 2

## 2016-08-24 MED ORDER — SODIUM CHLORIDE 0.9 % IV SOLN
INTRAVENOUS | Status: DC
  Administered 2016-08-24: 10:00:00 via INTRAVENOUS

## 2016-08-24 MED ORDER — PROPOFOL 500 MG/50ML IV EMUL
INTRAVENOUS | Status: DC | PRN
  Administered 2016-08-24: 100 ug/kg/min via INTRAVENOUS

## 2016-08-24 MED ORDER — FENTANYL CITRATE (PF) 100 MCG/2ML IJ SOLN
INTRAMUSCULAR | Status: DC | PRN
  Administered 2016-08-24: 50 ug via INTRAVENOUS

## 2016-08-24 NOTE — Anesthesia Postprocedure Evaluation (Signed)
Anesthesia Post Note  Patient: Courtney Lynn  Procedure(s) Performed: Procedure(s) (LRB): COLONOSCOPY WITH PROPOFOL (N/A)  Patient location during evaluation: Endoscopy Anesthesia Type: General Level of consciousness: awake and alert Pain management: pain level controlled Vital Signs Assessment: post-procedure vital signs reviewed and stable Respiratory status: spontaneous breathing, nonlabored ventilation, respiratory function stable and patient connected to nasal cannula oxygen Cardiovascular status: blood pressure returned to baseline and stable Postop Assessment: no signs of nausea or vomiting Anesthetic complications: no     Last Vitals:  Vitals:   08/24/16 1128 08/24/16 1138  BP: (!) 145/74 (!) 146/98  Pulse: (!) 51 (!) 53  Resp: 12 16  Temp:      Last Pain:  Vitals:   08/24/16 1108  TempSrc: Tympanic                 Martha Clan

## 2016-08-24 NOTE — Transfer of Care (Signed)
Immediate Anesthesia Transfer of Care Note  Patient: Courtney Lynn  Procedure(s) Performed: Procedure(s): COLONOSCOPY WITH PROPOFOL (N/A)  Patient Location: PACU  Anesthesia Type:General  Level of Consciousness: awake and sedated  Airway & Oxygen Therapy: Patient Spontanous Breathing and Patient connected to nasal cannula oxygen  Post-op Assessment: Report given to RN and Post -op Vital signs reviewed and stable  Post vital signs: Reviewed and stable  Last Vitals:  Vitals:   08/24/16 1008  BP: (!) 139/50  Pulse: (!) 57  Resp: 18  Temp: (!) 36.1 C    Last Pain:  Vitals:   08/24/16 1008  TempSrc: Tympanic         Complications: No apparent anesthesia complications

## 2016-08-24 NOTE — Op Note (Signed)
[facility] Patient Name: Courtney Lynn Procedure Date: 08/24/2016 10:35 AM MRN: [id number] Account #: [id number] Date of Birth: 1940-10-24 Admit Type: Outpatient Age: 76 Room: [facility] ENDO ROOM 3 Gender: Female Note Status: Finalized Procedure:            Colonoscopy Indications:          Generalized abdominal pain, Rectal bleeding, Change in                        bowel habits Providers:            Lollie Sails, MD Referring MD:         Glendon Axe (Referring MD) Medicines:            Monitored Anesthesia Care Complications:        No immediate complications. Procedure:            Pre-Anesthesia Assessment:                       - ASA Grade Assessment: III - A patient with severe                        systemic disease.                       After obtaining informed consent, the colonoscope was                        passed under direct vision. Throughout the procedure,                        the patient's blood pressure, pulse, and oxygen                        saturations were monitored continuously. The                        Colonoscope was introduced through the anus and                        advanced to the the cecum, identified by appendiceal                        orifice and ileocecal valve. The colonoscopy was                        performed without difficulty. The patient tolerated the                        procedure well. The quality of the bowel preparation                        was good. Findings:      A less than 1 mm polyp was found in the sigmoid colon. The polyp was       sessile. The polyp was removed with a cold biopsy forceps. Resection and       retrieval were complete.      A diffuse area of mildly granular mucosa was found in the sigmoid colon       and in the descending colon. Biopsies for histology were taken with a  cold forceps from the right colon and left colon for evaluation of       microscopic  colitis.      Non-bleeding internal hemorrhoids were found during retroflexion and       during anoscopy. The hemorrhoids were small and Grade I (internal       hemorrhoids that do not prolapse). Evidence of previous hemorrhoidal       banding noted.      The digital rectal exam was normal. Impression:           - One less than 1 mm polyp in the sigmoid colon,                        removed with a cold biopsy forceps. Resected and                        retrieved.                       - Granular mucosa in the sigmoid colon and in the                        descending colon. Biopsied.                       - Non-bleeding internal hemorrhoids. Recommendation:       - Miralax 1 capful (17 grams) in 8 ounces of water PO                        daily.                       - Return to GI clinic in 1 month. Procedure Code(s):    --- Professional ---                       [phone], Colonoscopy, flexible; with biopsy, single or                        multiple Diagnosis Code(s):    --- Professional ---                       D12.5, Benign neoplasm of sigmoid colon                       K63.89, Other specified diseases of intestine                       K64.0, First degree hemorrhoids                       R10.84, Generalized abdominal pain                       K62.5, Hemorrhage of anus and rectum                       R19.4, Change in bowel habit CPT copyright 2016 American Medical Association. All rights reserved. The codes documented in this report are preliminary and upon coder review may  be revised to meet current compliance requirements. Lollie Sails, MD 08/24/2016 11:08:36 AM This report has been signed electronically. Number of Addenda: 0 Note Initiated On:  08/24/2016 10:35 AM Scope Withdrawal Time: 0 hours 8 minutes 21 seconds  Total Procedure Duration: 0 hours 18 minutes 44 seconds       [facility]

## 2016-08-24 NOTE — Anesthesia Preprocedure Evaluation (Signed)
Anesthesia Evaluation  Patient identified by MRN, date of birth, ID band Patient awake    Reviewed: Allergy & Precautions, H&P , NPO status , Patient's Chart, lab work & pertinent test results, reviewed documented beta blocker date and time   History of Anesthesia Complications (+) DIFFICULT AIRWAY and history of anesthetic complications  Airway Mallampati: III  TM Distance: >3 FB Neck ROM: limited  Mouth opening: Limited Mouth Opening  Dental  (+) Caps, Implants, Missing, Dental Advidsory Given, Poor Dentition   Pulmonary neg shortness of breath, sleep apnea and Continuous Positive Airway Pressure Ventilation , neg COPD, neg recent URI, former smoker,           Cardiovascular Exercise Tolerance: Good hypertension, (-) angina(-) CAD, (-) Past MI, (-) Cardiac Stents and (-) CABG negative cardio ROS  + dysrhythmias + Valvular Problems/Murmurs MVP and MR      Neuro/Psych PSYCHIATRIC DISORDERS (Depression) negative neurological ROS     GI/Hepatic Neg liver ROS, GERD  ,  Endo/Other  diabetes (Borderline)  Renal/GU negative Renal ROS  negative genitourinary   Musculoskeletal   Abdominal   Peds  Hematology negative hematology ROS (+)   Anesthesia Other Findings Past Medical History: No date: Anemia No date: Arthritis No date: Body mass index (bmi) 37.0-37.9, adult No date: Cancer ([place])     Comment:  BREAST No date: Chronic insomnia No date: Complication of anesthesia     Comment:  has difficulty last surgery intubating No date: Depression No date: Dizziness No date: Dysrhythmia     Comment:  PVC'S,BRADYCARDIA No date: GERD (gastroesophageal reflux disease) No date: History of chicken pox No date: Hypertension No date: Hyponatremia No date: Intermittent vertigo No date: Irregular heart beat No date: Mild mitral regurgitation No date: 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HCC) No date: MVP (mitral valve prolapse) No  date: Pre-diabetes No date: PVC's (premature ventricular contractions) No date: Reflux No date: Sleep apnea   Reproductive/Obstetrics negative OB ROS                             Anesthesia Physical Anesthesia Plan  ASA: III  Anesthesia Plan: General   Post-op Pain Management:    Induction: Intravenous  PONV Risk Score and Plan: 3 and Propofol  Airway Management Planned: Natural Airway and Nasal Cannula  Additional Equipment:   Intra-op Plan:   Post-operative Plan:   Informed Consent: I have reviewed the patients History and Physical, chart, labs and discussed the procedure including the risks, benefits and alternatives for the proposed anesthesia with the patient or authorized representative who has indicated his/her understanding and acceptance.   Dental Advisory Given  Plan Discussed with: Anesthesiologist, CRNA and Surgeon  Anesthesia Plan Comments:         Anesthesia Quick Evaluation

## 2016-08-24 NOTE — Anesthesia Post-op Follow-up Note (Cosign Needed)
Anesthesia QCDR form completed.        

## 2016-08-24 NOTE — H&P (Signed)
Outpatient short stay form Pre-procedure 08/24/2016 10:29 AM Lollie Sails MD  Primary Physician: Dr. Glendon Axe  Reason for visit:  Colonoscopy  History of present illness:  Patient is a 76 year old female presenting today as above. She has a history of some change of bowel habits with increasing issues with constipation for which she is been increasing her fiber intake. She has complained of abdominal gas and bloating. She is also seen some rectal bleeding and reports having a history of hemorrhoids that of the banded several times. She tolerated her prep well. She takes no aspirin or blood thinning agents.    Current Facility-Administered Medications:  .  0.9 %  sodium chloride infusion, , Intravenous, Continuous, Lollie Sails, MD, Last Rate: 20 mL/hr at 08/24/16 1024 .  0.9 %  sodium chloride infusion, , Intravenous, Continuous, Lollie Sails, MD  Prescriptions Prior to Admission  Medication Sig Dispense Refill Last Dose  . amLODipine (NORVASC) 2.5 MG tablet Take 2.5 mg by mouth daily.  2 08/24/2016 at 0600  . citalopram (CELEXA) 10 MG tablet Take 5 mg by mouth every other day. In the evening   Past Week at Unknown time  . Glucosamine HCl 1000 MG TABS Take 1,000 mg by mouth daily.   Past Week at Unknown time  . Glycerin-Hypromellose-PEG 400 ([facility] DRY EYE RELIEF) 0.2-0.2-1 % SOLN Place 1-2 drops into both eyes 2 (two) times daily.   08/24/2016 at Unknown time  . ibuprofen (ADVIL,MOTRIN) 200 MG tablet Take 200 mg by mouth every 8 (eight) hours as needed (for pain.).   Past Week at Unknown time  . letrozole (FEMARA) 2.5 MG tablet Take 1 tablet (2.5 mg total) by mouth daily. 30 tablet 11 Past Week at Unknown time  . losartan (COZAAR) 100 MG tablet Take 100 mg by mouth at bedtime.    08/23/2016 at Unknown time  . meloxicam (MOBIC) 15 MG tablet Take 15 mg by mouth daily.    Past Week at Unknown time  . meloxicam (MOBIC) 15 MG tablet Take 1 tablet (15 mg total) by mouth daily. 30  tablet 1 Past Week at Unknown time  . metoprolol succinate (TOPROL-XL) 25 MG 24 hr tablet Take 25 mg by mouth daily.   08/24/2016 at 0600  . Multiple Vitamin (MULTIVITAMIN WITH MINERALS) TABS tablet Take 1 tablet by mouth at bedtime.   Past Week at Unknown time  . NON FORMULARY Take 1 capsule by mouth at bedtime. OmegaGuard (active ingredients) Fish oil 1200 mg-Omega 3 667 mg-EPA 363 MG-DHA 240 MG   Past Week at Unknown time  . Nutritional Supplements (NUTRITIONAL SUPPLEMENT PO) Take 1 capsule by mouth daily. Protandim Nrf2 Synergizer Milk thistle (Silybum marianum) extract (225 mg) Bacopa (Bacopa monnieri) extract (150 mg) Ashwagandha (Withania somnifera) root (150 mg) Green tea (Camellia sinensis) extract (75 mg) Turmeric (Curcuma longa) extract (75 mg)   Past Week at Unknown time  . OVER THE COUNTER MEDICATION Take 1 capsule by mouth at bedtime. Joint Health Complex  (active ingredients) 60 mg Vitamin C-1.5 mg Zinc-0.2 mg Copper-0.2 mg Manganese-1500 mg Glucosamine-100 mg Boswellia    Past Week at Unknown time  . Probiotic Product (PROBIOTIC PO) Take 1 capsule by mouth daily.   Past Week at Unknown time  . saccharomyces boulardii (FLORASTOR) 250 MG capsule Take 250 mg by mouth 2 (two) times daily.   Past Week at Unknown time  . silver sulfADIAZINE (SILVADENE) 1 % cream Apply 1 application topically 2 (two) times daily. [place]  g 2 Past Week at Unknown time  . traMADol (ULTRAM) 50 MG tablet Take 50 mg by mouth every 6 (six) hours as needed (for pain.).    Past Week at Unknown time  . acetaminophen (TYLENOL) 325 MG tablet Take 650 mg by mouth every 6 (six) hours as needed.   Taking  . calcium carbonate (TUMS EX) 750 MG chewable tablet Chew 1 tablet by mouth daily.   Not Taking at Unknown time  . Calcium-Magnesium-Vitamin D (SUPER CAL-MAG-D PO) Take 2 tablets by mouth at bedtime. OsteoMatrix 1000 mg elemental calcium-400 mg magnesium-600 I.U. Vitamin D3   Taking  . cephALEXin (KEFLEX) 500 MG capsule  Take 2,000 mg by mouth as directed. Prior to dental work.    Not Taking  . COCONUT OIL PO Take 1 capsule by mouth 2 (two) times daily.   Not Taking at Unknown time  . fluticasone (FLONASE) 50 MCG/ACT nasal spray    Not Taking at Unknown time  . HYDROcodone-acetaminophen (NORCO) 5-325 MG tablet Take 1-2 tablets by mouth every 4 (four) hours as needed for moderate pain. (Patient not taking: Reported on 07/29/2016) 12 tablet 0 Not Taking  . meclizine (ANTIVERT) 12.5 MG tablet Take 12.5-25 mg by mouth 3 (three) times daily as needed for dizziness.  0 Not Taking  . NONFORMULARY OR COMPOUNDED ITEM Take 1-2 capsules by mouth 3 (three) times daily. LunaRich X (Lunasin-soy peptide) 2 capsule in the morning, 2 capsules mid-day, and 1 capsule at bedtime.   Not Taking  . omeprazole (PRILOSEC) 20 MG capsule Take 20 mg by mouth daily.    08/22/2016  . polyethylene glycol powder (GLYCOLAX/MIRALAX) powder Take as directed for colonic prep.   Taking     Allergies  Allergen Reactions  . Cleocin [Clindamycin Hcl] Swelling, Rash and Other (See Comments)    Tongue, lip swelling Tongue, lip swelling Tongue, lip swelling  . Pen Vk [Penicillin V] Swelling, Rash and Other (See Comments)    Tongue, lip swelling Funny feeling on the tongue  Has patient had a PCN reaction causing immediate rash, facial/tongue/throat swelling, SOB or lightheadedness with hypotension:Yes Has patient had a PCN reaction causing severe rash involving mucus membranes or skin necrosis:No Has patient had a PCN reaction that required hospitalization:No Has patient had a PCN reaction occurring within the last 10 years:No If all of the above answers are "NO", then may proceed with Cephalosporin use.      Past Medical History:  Diagnosis Date  . Anemia   . Arthritis   . Body mass index (bmi) 37.0-37.9, adult   . Cancer (HCC)    BREAST  . Chronic insomnia   . Complication of anesthesia    has difficulty last surgery intubating  .  Depression   . Dizziness   . Dysrhythmia    PVC'S,BRADYCARDIA  . GERD (gastroesophageal reflux disease)   . History of chicken pox   . Hypertension   . Hyponatremia   . Intermittent vertigo   . Irregular heart beat   . Mild mitral regurgitation   . 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[place])   . MVP (mitral valve prolapse)   . Pre-diabetes   . PVC's (premature ventricular contractions)   . Reflux   . Sleep apnea     Review of systems:      Physical Exam    Heart and lungs: Regular rate and rhythm without rub or gallop, lungs are bilaterally clear    HEENT: Normocephalic atraumatic eyes are anicteric    Other:  Pertinant exam for procedure: Soft nontender nondistended bowel sounds positive normoactive.    Planned proceedures: Colonoscopy and indicated procedures. I have discussed the risks benefits and complications of procedures to include not limited to bleeding, infection, perforation and the risk of sedation and the patient wishes to proceed.    Lollie Sails, MD Gastroenterology 08/24/2016  10:29 AM

## 2016-08-24 NOTE — Anesthesia Procedure Notes (Signed)
Performed by: COOK-MARTIN, Concepcion Kirkpatrick Pre-anesthesia Checklist: Patient identified, Emergency Drugs available, Suction available, Patient being monitored and Timeout performed Patient Re-evaluated:Patient Re-evaluated prior to induction Oxygen Delivery Method: Nasal cannula Preoxygenation: Pre-oxygenation with 100% oxygen Induction Type: IV induction Ventilation: Oral airway inserted - appropriate to patient size Placement Confirmation: positive ETCO2 and CO2 detector       

## 2016-08-25 ENCOUNTER — Encounter: Payer: Self-pay | Admitting: Gastroenterology

## 2016-08-25 LAB — SURGICAL PATHOLOGY

## 2016-09-03 DIAGNOSIS — H2511 Age-related nuclear cataract, right eye: Secondary | ICD-10-CM | POA: Diagnosis not present

## 2016-09-08 ENCOUNTER — Encounter: Payer: Self-pay | Admitting: *Deleted

## 2016-09-10 ENCOUNTER — Other Ambulatory Visit: Payer: Self-pay | Admitting: Surgery

## 2016-09-10 DIAGNOSIS — Z853 Personal history of malignant neoplasm of breast: Secondary | ICD-10-CM

## 2016-09-13 DIAGNOSIS — G4733 Obstructive sleep apnea (adult) (pediatric): Secondary | ICD-10-CM | POA: Diagnosis not present

## 2016-09-14 ENCOUNTER — Ambulatory Visit: Payer: PPO | Admitting: Anesthesiology

## 2016-09-14 ENCOUNTER — Encounter
Admission: RE | Disposition: A | Discharge status: Home / Self Care | Payer: Self-pay | Source: Ambulatory Visit | Attending: Ophthalmology

## 2016-09-14 ENCOUNTER — Encounter: Payer: Self-pay | Admitting: *Deleted

## 2016-09-14 ENCOUNTER — Ambulatory Visit
Admission: RE | Admit: 2016-09-14 | Discharge: 2016-09-14 | Disposition: A | Discharge status: Home / Self Care | Payer: PPO | Source: Ambulatory Visit | Attending: Ophthalmology | Admitting: Ophthalmology

## 2016-09-14 DIAGNOSIS — I499 Cardiac arrhythmia, unspecified: Secondary | ICD-10-CM | POA: Insufficient documentation

## 2016-09-14 DIAGNOSIS — Z9049 Acquired absence of other specified parts of digestive tract: Secondary | ICD-10-CM | POA: Diagnosis not present

## 2016-09-14 DIAGNOSIS — K219 Gastro-esophageal reflux disease without esophagitis: Secondary | ICD-10-CM | POA: Insufficient documentation

## 2016-09-14 DIAGNOSIS — Z880 Allergy status to penicillin: Secondary | ICD-10-CM | POA: Insufficient documentation

## 2016-09-14 DIAGNOSIS — Z96651 Presence of right artificial knee joint: Secondary | ICD-10-CM | POA: Insufficient documentation

## 2016-09-14 DIAGNOSIS — F329 Major depressive disorder, single episode, unspecified: Secondary | ICD-10-CM | POA: Insufficient documentation

## 2016-09-14 DIAGNOSIS — Z888 Allergy status to other drugs, medicaments and biological substances status: Secondary | ICD-10-CM | POA: Insufficient documentation

## 2016-09-14 DIAGNOSIS — H2511 Age-related nuclear cataract, right eye: Secondary | ICD-10-CM | POA: Diagnosis not present

## 2016-09-14 DIAGNOSIS — Z87891 Personal history of nicotine dependence: Secondary | ICD-10-CM | POA: Insufficient documentation

## 2016-09-14 DIAGNOSIS — R0601 Orthopnea: Secondary | ICD-10-CM | POA: Diagnosis not present

## 2016-09-14 DIAGNOSIS — I10 Essential (primary) hypertension: Secondary | ICD-10-CM | POA: Insufficient documentation

## 2016-09-14 DIAGNOSIS — Z853 Personal history of malignant neoplasm of breast: Secondary | ICD-10-CM | POA: Diagnosis not present

## 2016-09-14 DIAGNOSIS — G4730 Sleep apnea, unspecified: Secondary | ICD-10-CM | POA: Insufficient documentation

## 2016-09-14 HISTORY — DX: Orthopnea: R06.01

## 2016-09-14 HISTORY — PX: CATARACT EXTRACTION W/PHACO: SHX586

## 2016-09-14 SURGERY — PHACOEMULSIFICATION, CATARACT, WITH IOL INSERTION
Anesthesia: Monitor Anesthesia Care | Site: Eye | Laterality: Right | Wound class: Clean

## 2016-09-14 MED ORDER — MIDAZOLAM HCL 2 MG/2ML IJ SOLN
INTRAMUSCULAR | Status: AC
  Filled 2016-09-14: qty 2

## 2016-09-14 MED ORDER — MIDAZOLAM HCL 2 MG/2ML IJ SOLN
INTRAMUSCULAR | Status: DC | PRN
  Administered 2016-09-14: 0.5 mg via INTRAVENOUS

## 2016-09-14 MED ORDER — ARMC OPHTHALMIC DILATING DROPS
OPHTHALMIC | Status: AC
  Administered 2016-09-14: 1 via OPHTHALMIC
  Filled 2016-09-14: qty 0.4

## 2016-09-14 MED ORDER — MOXIFLOXACIN HCL 0.5 % OP SOLN: 1.0000 [drp] | OPHTHALMIC | Status: DC | PRN

## 2016-09-14 MED ORDER — POVIDONE-IODINE 5 % OP SOLN
OPHTHALMIC | Status: DC | PRN
  Administered 2016-09-14: 1 via OPHTHALMIC

## 2016-09-14 MED ORDER — LIDOCAINE HCL (PF) 4 % IJ SOLN
INTRAOCULAR | Status: DC | PRN
  Administered 2016-09-14: 2 mL via OPHTHALMIC

## 2016-09-14 MED ORDER — SODIUM CHLORIDE 0.9 % IV SOLN
INTRAVENOUS | Status: DC
  Administered 2016-09-14: 09:00:00 via INTRAVENOUS

## 2016-09-14 MED ORDER — FENTANYL CITRATE (PF) 100 MCG/2ML IJ SOLN
INTRAMUSCULAR | Status: DC | PRN
Start: 2016-09-14 — End: 2016-09-14
  Administered 2016-09-14: 25 ug via INTRAVENOUS

## 2016-09-14 MED ORDER — MOXIFLOXACIN HCL 0.5 % OP SOLN
OPHTHALMIC | Status: AC
  Filled 2016-09-14: qty 3

## 2016-09-14 MED ORDER — FENTANYL CITRATE (PF) 100 MCG/2ML IJ SOLN
INTRAMUSCULAR | Status: AC
  Filled 2016-09-14: qty 2

## 2016-09-14 MED ORDER — CARBACHOL 0.01 % IO SOLN
INTRAOCULAR | Status: DC | PRN
  Administered 2016-09-14: .5 mL via INTRAOCULAR

## 2016-09-14 MED ORDER — NA CHONDROIT SULF-NA HYALURON 40-17 MG/ML IO SOLN
INTRAOCULAR | Status: DC | PRN
  Administered 2016-09-14: 1 mL via INTRAOCULAR

## 2016-09-14 MED ORDER — ARMC OPHTHALMIC DILATING DROPS
1.0000 "application " | OPHTHALMIC | Status: AC
  Administered 2016-09-14 (×3): 1 via OPHTHALMIC

## 2016-09-14 MED ORDER — EPINEPHRINE PF 1 MG/ML IJ SOLN
INTRAOCULAR | Status: DC | PRN
  Administered 2016-09-14: 1 mL via OPHTHALMIC

## 2016-09-14 MED ORDER — MOXIFLOXACIN HCL 0.5 % OP SOLN
OPHTHALMIC | Status: DC | PRN
  Administered 2016-09-14: .2 mL via OPHTHALMIC

## 2016-09-14 SURGICAL SUPPLY — 16 items
GLOVE BIO SURGEON STRL SZ8 (GLOVE) ×3 IMPLANT
GLOVE BIOGEL M 6.5 STRL (GLOVE) ×3 IMPLANT
GLOVE SURG LX 8.0 MICRO (GLOVE) ×2
GLOVE SURG LX STRL 8.0 MICRO (GLOVE) ×1 IMPLANT
GOWN STRL REUS W/ TWL LRG LVL3 (GOWN DISPOSABLE) ×2 IMPLANT
GOWN STRL REUS W/TWL LRG LVL3 (GOWN DISPOSABLE) ×4
LABEL CATARACT MEDS ST (LABEL) ×3 IMPLANT
LENS IOL TECNIS ITEC 25.0 (Intraocular Lens) ×3 IMPLANT
PACK CATARACT (MISCELLANEOUS) ×3 IMPLANT
PACK CATARACT BRASINGTON LX (MISCELLANEOUS) ×3 IMPLANT
PACK EYE AFTER SURG (MISCELLANEOUS) ×3 IMPLANT
SOL BSS BAG (MISCELLANEOUS) ×3
SOLUTION BSS BAG (MISCELLANEOUS) ×1 IMPLANT
SYR 5ML LL (SYRINGE) ×3 IMPLANT
WATER STERILE IRR 250ML POUR (IV SOLUTION) ×3 IMPLANT
WIPE NON LINTING 3.25X3.25 (MISCELLANEOUS) ×3 IMPLANT

## 2016-09-14 NOTE — Discharge Instructions (Signed)

## 2016-09-14 NOTE — H&P (Signed)
All labs reviewed. Abnormal studies sent to patients PCP when indicated.  Previous H&P reviewed, patient examined, there are NO CHANGES.  Courtney Lynn LOUIS8/14/201810:28 AM

## 2016-09-14 NOTE — Op Note (Signed)
PREOPERATIVE DIAGNOSIS:  Nuclear sclerotic cataract of the right eye.   POSTOPERATIVE DIAGNOSIS:  nuclear sclerotic cataract right eye   OPERATIVE PROCEDURE: Procedure(s): CATARACT EXTRACTION PHACO AND INTRAOCULAR LENS PLACEMENT (IOC)   SURGEON:  Birder Robson, MD.   ANESTHESIA:  Anesthesiologist: Gunnar Fusi, MD CRNA: Darlyne Russian, CRNA  1.      Managed anesthesia care. 2.      0.50ml of Shugarcaine was instilled in the eye following the paracentesis.   COMPLICATIONS:  None.   TECHNIQUE:   Stop and chop   DESCRIPTION OF PROCEDURE:  The patient was examined and consented in the preoperative holding area where the aforementioned topical anesthesia was applied to the right eye and then brought back to the Operating Room where the right eye was prepped and draped in the usual sterile ophthalmic fashion and a lid speculum was placed. A paracentesis was created with the side port blade and the anterior chamber was filled with viscoelastic. A near clear corneal incision was performed with the steel keratome. A continuous curvilinear capsulorrhexis was performed with a cystotome followed by the capsulorrhexis forceps. Hydrodissection and hydrodelineation were carried out with BSS on a blunt cannula. The lens was removed in a stop and chop  technique and the remaining cortical material was removed with the irrigation-aspiration handpiece. The capsular bag was inflated with viscoelastic and the Technis ZCB00  lens was placed in the capsular bag without complication. The remaining viscoelastic was removed from the eye with the irrigation-aspiration handpiece. The wounds were hydrated. The anterior chamber was flushed with Miostat and the eye was inflated to physiologic pressure. 0.63ml of Vigamox was placed in the anterior chamber. The wounds were found to be water tight. The eye was dressed with Vigamox. The patient was given protective glasses to wear throughout the day and a shield with which  to sleep tonight. The patient was also given drops with which to begin a drop regimen today and will follow-up with me in one day.  Implant Name Type Inv. Item Serial No. Manufacturer Lot No. LRB No. Used  LENS IOL DIOP 25.0 - [id number] Intraocular Lens LENS IOL DIOP 25.0 [id number]   Right 1   Procedure(s) with comments: CATARACT EXTRACTION PHACO AND INTRAOCULAR LENS PLACEMENT (IOC) (Right) - [place] 00:32 AP% 19.5 CDE 6.28 Fluid pack lot # [id number]  Electronically signed: [place] 09/14/2016 10:52 AM

## 2016-09-14 NOTE — Anesthesia Preprocedure Evaluation (Signed)
Anesthesia Evaluation  Patient identified by MRN, date of birth, ID band Patient awake    Reviewed: Allergy & Precautions, NPO status , Patient's Chart, lab work & pertinent test results  Airway Mallampati: II       Dental   Pulmonary neg pulmonary ROS, former smoker,           Cardiovascular hypertension, Pt. on medications + dysrhythmias      Neuro/Psych Depression    GI/Hepatic Neg liver ROS, GERD  Medicated,  Endo/Other  negative endocrine ROS  Renal/GU negative Renal ROS     Musculoskeletal   Abdominal   Peds  Hematology   Anesthesia Other Findings   Reproductive/Obstetrics                             Anesthesia Physical Anesthesia Plan  ASA: III  Anesthesia Plan: MAC   Post-op Pain Management:    Induction:   PONV Risk Score and Plan:   Airway Management Planned:   Additional Equipment:   Intra-op Plan:   Post-operative Plan:   Informed Consent: I have reviewed the patients History and Physical, chart, labs and discussed the procedure including the risks, benefits and alternatives for the proposed anesthesia with the patient or authorized representative who has indicated his/her understanding and acceptance.     Plan Discussed with:   Anesthesia Plan Comments:         Anesthesia Quick Evaluation

## 2016-09-14 NOTE — Anesthesia Postprocedure Evaluation (Signed)
Anesthesia Post Note  Patient: Courtney Lynn  Procedure(s) Performed: Procedure(s) (LRB): CATARACT EXTRACTION PHACO AND INTRAOCULAR LENS PLACEMENT (IOC) (Right)  Patient location during evaluation: PACU Anesthesia Type: MAC Level of consciousness: awake and alert Pain management: pain level controlled Vital Signs Assessment: post-procedure vital signs reviewed and stable Respiratory status: spontaneous breathing, nonlabored ventilation, respiratory function stable and patient connected to nasal cannula oxygen Cardiovascular status: stable and blood pressure returned to baseline Anesthetic complications: no     Last Vitals:  Vitals:   09/14/16 0940 09/14/16 1053  BP:  (!) 148/61  Pulse:  (!) 49  Resp:  16  Temp: (!) 35.6 C   SpO2:  100%    Last Pain:  Vitals:   09/14/16 1053  TempSrc: Temporal  PainSc: 0-No pain                 Darlyne Russian

## 2016-09-14 NOTE — Anesthesia Procedure Notes (Signed)
Procedure Name: MAC Date/Time: 09/14/2016 10:33 AM Performed by: Darlyne Russian Pre-anesthesia Checklist: Patient identified, Emergency Drugs available, Suction available, Patient being monitored and Timeout performed Patient Re-evaluated:Patient Re-evaluated prior to induction Oxygen Delivery Method: Nasal cannula Placement Confirmation: positive ETCO2

## 2016-09-14 NOTE — Transfer of Care (Signed)
Immediate Anesthesia Transfer of Care Note  Patient: Courtney Lynn  Procedure(s) Performed: Procedure(s) with comments: CATARACT EXTRACTION PHACO AND INTRAOCULAR LENS PLACEMENT (IOC) (Right) - [place] 00:32 AP% 19.5 CDE 6.28 Fluid pack lot # [id number]  Patient Location: PACU  Anesthesia Type:MAC  Level of Consciousness: awake, alert  and oriented  Airway & Oxygen Therapy: Patient Spontanous Breathing  Post-op Assessment: Report given to RN and Post -op Vital signs reviewed and stable  Post vital signs: Reviewed and stable  Last Vitals:  Vitals:   09/14/16 0940 09/14/16 1053  BP:  (!) 148/61  Pulse:  (!) 49  Resp:  16  Temp: (!) 35.6 C   SpO2:  100%    Last Pain:  Vitals:   09/14/16 1053  TempSrc: Temporal  PainSc: 0-No pain         Complications: No apparent anesthesia complications

## 2016-09-14 NOTE — Anesthesia Post-op Follow-up Note (Signed)
Anesthesia QCDR form completed.        

## 2016-09-28 DIAGNOSIS — H2512 Age-related nuclear cataract, left eye: Secondary | ICD-10-CM | POA: Diagnosis not present

## 2016-09-29 ENCOUNTER — Encounter: Payer: Self-pay | Admitting: *Deleted

## 2016-09-30 ENCOUNTER — Ambulatory Visit: Payer: PPO | Admitting: Radiation Oncology

## 2016-09-30 DIAGNOSIS — R8290 Unspecified abnormal findings in urine: Secondary | ICD-10-CM | POA: Diagnosis not present

## 2016-10-01 DIAGNOSIS — R143 Flatulence: Secondary | ICD-10-CM | POA: Diagnosis not present

## 2016-10-01 DIAGNOSIS — K219 Gastro-esophageal reflux disease without esophagitis: Secondary | ICD-10-CM | POA: Diagnosis not present

## 2016-10-01 DIAGNOSIS — K5900 Constipation, unspecified: Secondary | ICD-10-CM | POA: Diagnosis not present

## 2016-10-05 ENCOUNTER — Ambulatory Visit: Payer: PPO | Admitting: Anesthesiology

## 2016-10-05 ENCOUNTER — Encounter: Payer: Self-pay | Admitting: *Deleted

## 2016-10-05 ENCOUNTER — Ambulatory Visit
Admission: RE | Admit: 2016-10-05 | Discharge: 2016-10-05 | Disposition: A | Discharge status: Home / Self Care | Payer: PPO | Source: Ambulatory Visit | Attending: Ophthalmology | Admitting: Ophthalmology

## 2016-10-05 ENCOUNTER — Encounter
Admission: RE | Disposition: A | Discharge status: Home / Self Care | Payer: Self-pay | Source: Ambulatory Visit | Attending: Ophthalmology

## 2016-10-05 DIAGNOSIS — Z9989 Dependence on other enabling machines and devices: Secondary | ICD-10-CM | POA: Diagnosis not present

## 2016-10-05 DIAGNOSIS — H2512 Age-related nuclear cataract, left eye: Secondary | ICD-10-CM | POA: Insufficient documentation

## 2016-10-05 DIAGNOSIS — I10 Essential (primary) hypertension: Secondary | ICD-10-CM | POA: Diagnosis not present

## 2016-10-05 DIAGNOSIS — G4730 Sleep apnea, unspecified: Secondary | ICD-10-CM | POA: Diagnosis not present

## 2016-10-05 DIAGNOSIS — Z880 Allergy status to penicillin: Secondary | ICD-10-CM | POA: Diagnosis not present

## 2016-10-05 DIAGNOSIS — Z87891 Personal history of nicotine dependence: Secondary | ICD-10-CM | POA: Insufficient documentation

## 2016-10-05 DIAGNOSIS — Z853 Personal history of malignant neoplasm of breast: Secondary | ICD-10-CM | POA: Diagnosis not present

## 2016-10-05 DIAGNOSIS — F329 Major depressive disorder, single episode, unspecified: Secondary | ICD-10-CM | POA: Insufficient documentation

## 2016-10-05 DIAGNOSIS — K219 Gastro-esophageal reflux disease without esophagitis: Secondary | ICD-10-CM | POA: Diagnosis not present

## 2016-10-05 DIAGNOSIS — M1990 Unspecified osteoarthritis, unspecified site: Secondary | ICD-10-CM | POA: Diagnosis not present

## 2016-10-05 DIAGNOSIS — Z79899 Other long term (current) drug therapy: Secondary | ICD-10-CM | POA: Insufficient documentation

## 2016-10-05 DIAGNOSIS — Z888 Allergy status to other drugs, medicaments and biological substances status: Secondary | ICD-10-CM | POA: Insufficient documentation

## 2016-10-05 DIAGNOSIS — I493 Ventricular premature depolarization: Secondary | ICD-10-CM | POA: Diagnosis not present

## 2016-10-05 DIAGNOSIS — Z96651 Presence of right artificial knee joint: Secondary | ICD-10-CM | POA: Insufficient documentation

## 2016-10-05 HISTORY — DX: Failed or difficult intubation, initial encounter: T88.4XXA

## 2016-10-05 HISTORY — PX: CATARACT EXTRACTION W/PHACO: SHX586

## 2016-10-05 SURGERY — PHACOEMULSIFICATION, CATARACT, WITH IOL INSERTION
Anesthesia: Monitor Anesthesia Care | Site: Eye | Laterality: Left | Wound class: Clean

## 2016-10-05 MED ORDER — LIDOCAINE HCL (PF) 4 % IJ SOLN
INTRAMUSCULAR | Status: AC
  Filled 2016-10-05: qty 5

## 2016-10-05 MED ORDER — NA CHONDROIT SULF-NA HYALURON 40-17 MG/ML IO SOLN
INTRAOCULAR | Status: AC
  Filled 2016-10-05: qty 1

## 2016-10-05 MED ORDER — EPINEPHRINE PF 1 MG/ML IJ SOLN
INTRAMUSCULAR | Status: AC
  Filled 2016-10-05: qty 2

## 2016-10-05 MED ORDER — MOXIFLOXACIN HCL 0.5 % OP SOLN
OPHTHALMIC | Status: DC | PRN
  Administered 2016-10-05: 0.2 mL via OPHTHALMIC

## 2016-10-05 MED ORDER — EPINEPHRINE PF 1 MG/ML IJ SOLN
INTRAOCULAR | Status: DC | PRN
  Administered 2016-10-05: 11:00:00 via OPHTHALMIC

## 2016-10-05 MED ORDER — ARMC OPHTHALMIC DILATING DROPS
1.0000 "application " | OPHTHALMIC | Status: AC
  Administered 2016-10-05 (×3): 1 via OPHTHALMIC

## 2016-10-05 MED ORDER — FENTANYL CITRATE (PF) 100 MCG/2ML IJ SOLN
INTRAMUSCULAR | Status: DC | PRN
  Administered 2016-10-05: 50 ug via INTRAVENOUS

## 2016-10-05 MED ORDER — MOXIFLOXACIN HCL 0.5 % OP SOLN: 1.0000 [drp] | OPHTHALMIC | Status: DC | PRN

## 2016-10-05 MED ORDER — NA CHONDROIT SULF-NA HYALURON 40-17 MG/ML IO SOLN
INTRAOCULAR | Status: DC | PRN
  Administered 2016-10-05: 1 mL via INTRAOCULAR

## 2016-10-05 MED ORDER — LIDOCAINE HCL (PF) 4 % IJ SOLN
INTRAOCULAR | Status: DC | PRN
  Administered 2016-10-05: 4 mL via OPHTHALMIC

## 2016-10-05 MED ORDER — MOXIFLOXACIN HCL 0.5 % OP SOLN
OPHTHALMIC | Status: AC
  Filled 2016-10-05: qty 3

## 2016-10-05 MED ORDER — POVIDONE-IODINE 5 % OP SOLN
OPHTHALMIC | Status: DC | PRN
  Administered 2016-10-05: 1 via OPHTHALMIC

## 2016-10-05 MED ORDER — ARMC OPHTHALMIC DILATING DROPS
OPHTHALMIC | Status: AC
  Administered 2016-10-05: 1 via OPHTHALMIC
  Filled 2016-10-05: qty 0.4

## 2016-10-05 MED ORDER — POVIDONE-IODINE 5 % OP SOLN
OPHTHALMIC | Status: AC
  Filled 2016-10-05: qty 30

## 2016-10-05 MED ORDER — FENTANYL CITRATE (PF) 100 MCG/2ML IJ SOLN
INTRAMUSCULAR | Status: AC
  Filled 2016-10-05: qty 2

## 2016-10-05 MED ORDER — CARBACHOL 0.01 % IO SOLN
INTRAOCULAR | Status: DC | PRN
  Administered 2016-10-05: 0.5 mL via INTRAOCULAR

## 2016-10-05 MED ORDER — SODIUM CHLORIDE 0.9 % IV SOLN
INTRAVENOUS | Status: DC
  Administered 2016-10-05: 09:00:00 via INTRAVENOUS

## 2016-10-05 SURGICAL SUPPLY — 16 items
GLOVE BIO SURGEON STRL SZ8 (GLOVE) ×3 IMPLANT
GLOVE BIOGEL M 6.5 STRL (GLOVE) ×3 IMPLANT
GLOVE SURG LX 8.0 MICRO (GLOVE) ×2
GLOVE SURG LX STRL 8.0 MICRO (GLOVE) ×1 IMPLANT
GOWN STRL REUS W/ TWL LRG LVL3 (GOWN DISPOSABLE) ×2 IMPLANT
GOWN STRL REUS W/TWL LRG LVL3 (GOWN DISPOSABLE) ×4
LABEL CATARACT MEDS ST (LABEL) ×3 IMPLANT
LENS IOL TECNIS ITEC 24.5 (Intraocular Lens) ×3 IMPLANT
PACK CATARACT (MISCELLANEOUS) ×3 IMPLANT
PACK CATARACT BRASINGTON LX (MISCELLANEOUS) ×3 IMPLANT
PACK EYE AFTER SURG (MISCELLANEOUS) ×3 IMPLANT
SOL BSS BAG (MISCELLANEOUS) ×3
SOLUTION BSS BAG (MISCELLANEOUS) ×1 IMPLANT
SYR 5ML LL (SYRINGE) ×3 IMPLANT
WATER STERILE IRR 250ML POUR (IV SOLUTION) ×3 IMPLANT
WIPE NON LINTING 3.25X3.25 (MISCELLANEOUS) ×3 IMPLANT

## 2016-10-05 NOTE — Anesthesia Post-op Follow-up Note (Signed)
Anesthesia QCDR form completed.        

## 2016-10-05 NOTE — Transfer of Care (Signed)
Immediate Anesthesia Transfer of Care Note  Patient: Courtney Lynn  Procedure(s) Performed: Procedure(s) with comments: CATARACT EXTRACTION PHACO AND INTRAOCULAR LENS PLACEMENT (IOC) (Left) - [place] 00:46.9 AP% 16.0 CDE 7.48 Fluid Pack lot # [id number]  Patient Location: PACU and Short Stay  Anesthesia Type:MAC  Level of Consciousness: awake, alert  and oriented  Airway & Oxygen Therapy: Patient Spontanous Breathing  Post-op Assessment: Report given to RN and Post -op Vital signs reviewed and stable  Post vital signs: Reviewed and stable  Last Vitals:  Vitals:   10/05/16 1045 10/05/16 1046  BP: (!) 157/66 (!) 157/66  Pulse: (!) 55 (!) 54  Resp: 16 18  Temp: (!) 35.9 C   SpO2: 100% 100%    Last Pain:  Vitals:   10/05/16 1045  TempSrc: Temporal         Complications: No apparent anesthesia complications

## 2016-10-05 NOTE — Anesthesia Preprocedure Evaluation (Addendum)
Anesthesia Evaluation  Patient identified by MRN, date of birth, ID band Patient awake    Reviewed: Allergy & Precautions, NPO status , Patient's Chart, lab work & pertinent test results, reviewed documented beta blocker date and time   History of Anesthesia Complications (+) DIFFICULT AIRWAY and history of anesthetic complications  Airway Mallampati: III  TM Distance: >3 FB     Dental  (+) Chipped   Pulmonary sleep apnea and Continuous Positive Airway Pressure Ventilation , former smoker,           Cardiovascular hypertension, Pt. on medications + Orthopnea  + dysrhythmias      Neuro/Psych PSYCHIATRIC DISORDERS Depression    GI/Hepatic GERD  ,  Endo/Other    Renal/GU      Musculoskeletal  (+) Arthritis ,   Abdominal   Peds  Hematology  (+) anemia ,   Anesthesia Other Findings Has frequent PVCs. Hx of difficult intubation. Small mouth with protruding teeth. Poor teeth.  Reproductive/Obstetrics                            Anesthesia Physical Anesthesia Plan  ASA: III  Anesthesia Plan: MAC   Post-op Pain Management:    Induction:   PONV Risk Score and Plan:   Airway Management Planned:   Additional Equipment:   Intra-op Plan:   Post-operative Plan:   Informed Consent: I have reviewed the patients History and Physical, chart, labs and discussed the procedure including the risks, benefits and alternatives for the proposed anesthesia with the patient or authorized representative who has indicated his/her understanding and acceptance.     Plan Discussed with: CRNA  Anesthesia Plan Comments:         Anesthesia Quick Evaluation

## 2016-10-05 NOTE — H&P (Signed)
All labs reviewed. Abnormal studies sent to patients PCP when indicated.  Previous H&P reviewed, patient examined, there are NO CHANGES.  Courtney Espindola LOUIS9/4/201810:21 AM

## 2016-10-05 NOTE — Discharge Instructions (Signed)
Follow Dr. Inda Coke postop eye drop sheet as reviewed.  Eye Surgery Discharge Instructions  Expect mild scratchy sensation or mild soreness. DO NOT RUB YOUR EYE!  The day of surgery:  Minimal physical activity, but bed rest is not required  No reading, computer work, or close hand work  No bending, lifting, or straining.  May watch TV  For 24 hours:  No driving, legal decisions, or alcoholic beverages  Safety precautions  Eat anything you prefer: It is better to start with liquids, then soup then solid foods.  _____ Eye patch should be worn until postoperative exam tomorrow.  ____ Solar shield eyeglasses should be worn for comfort in the sunlight/patch while sleeping  Resume all regular medications including aspirin or Coumadin if these were discontinued prior to surgery. You may shower, bathe, shave, or wash your hair. Tylenol may be taken for mild discomfort.  Call your doctor if you experience significant pain, nausea, or vomiting, fever > 101 or other signs of infection. [phone] or [phone] Specific instructions:  Follow-up Information    Birder Robson, MD Follow up.   Specialty:  Ophthalmology Why:  10/06/16 @ 10:40 AM Contact information: [address] [place] [address] [phone]

## 2016-10-05 NOTE — Anesthesia Postprocedure Evaluation (Signed)
Anesthesia Post Note  Patient: Lorine Iannaccone  Procedure(s) Performed: Procedure(s) (LRB): CATARACT EXTRACTION PHACO AND INTRAOCULAR LENS PLACEMENT (IOC) (Left)  Patient location during evaluation: Short Stay Anesthesia Type: MAC Level of consciousness: awake and alert and oriented Pain management: pain level controlled Vital Signs Assessment: post-procedure vital signs reviewed and stable Respiratory status: spontaneous breathing and respiratory function stable Cardiovascular status: stable Postop Assessment: no headache and adequate PO intake Anesthetic complications: no     Last Vitals:  Vitals:   10/05/16 1046 10/05/16 1053  BP: (!) 157/66 (!) 156/58  Pulse: (!) 54 (!) 57  Resp: 18 18  Temp:    SpO2: 100% 100%    Last Pain:  Vitals:   10/05/16 1045  TempSrc: Temporal                 Lanora Manis

## 2016-10-05 NOTE — Op Note (Signed)
PREOPERATIVE DIAGNOSIS:  Nuclear sclerotic cataract of the left eye.   POSTOPERATIVE DIAGNOSIS:  Nuclear sclerotic cataract of the left eye.   OPERATIVE PROCEDURE: Procedure(s): CATARACT EXTRACTION PHACO AND INTRAOCULAR LENS PLACEMENT (IOC)   SURGEON:  Birder Robson, MD.   ANESTHESIA:  Anesthesiologist: Gunnar Bulla, MD CRNA: Jonna Clark, CRNA  1.      Managed anesthesia care. 2.     0.46ml of Shugarcaine was instilled following the paracentesis   COMPLICATIONS:  None.   TECHNIQUE:   Stop and chop   DESCRIPTION OF PROCEDURE:  The patient was examined and consented in the preoperative holding area where the aforementioned topical anesthesia was applied to the left eye and then brought back to the Operating Room where the left eye was prepped and draped in the usual sterile ophthalmic fashion and a lid speculum was placed. A paracentesis was created with the side port blade and the anterior chamber was filled with viscoelastic. A near clear corneal incision was performed with the steel keratome. A continuous curvilinear capsulorrhexis was performed with a cystotome followed by the capsulorrhexis forceps. Hydrodissection and hydrodelineation were carried out with BSS on a blunt cannula. The lens was removed in a stop and chop  technique and the remaining cortical material was removed with the irrigation-aspiration handpiece. The capsular bag was inflated with viscoelastic and the Technis [id number] lens was placed in the capsular bag without complication. The remaining viscoelastic was removed from the eye with the irrigation-aspiration handpiece. The wounds were hydrated. The anterior chamber was flushed with Miostat and the eye was inflated to physiologic pressure. 0.66ml Vigamox was placed in the anterior chamber. The wounds were found to be water tight. The eye was dressed with Vigamox. The patient was given protective glasses to wear throughout the day and a shield with which to sleep  tonight. The patient was also given drops with which to begin a drop regimen today and will follow-up with me in one day.  Implant Name Type Inv. Item Serial No. Manufacturer Lot No. LRB No. Used  LENS IOL DIOP 24.5 - [id number] Intraocular Lens LENS IOL DIOP 24.5 [id number] AMO   Left 1    Procedure(s) with comments: CATARACT EXTRACTION PHACO AND INTRAOCULAR LENS PLACEMENT (IOC) (Left) - [place] 00:46.9 AP% 16.0 CDE 7.48 Fluid Pack lot # [id number]  Electronically signed: [place] 10/05/2016 10:43 AM

## 2016-10-06 ENCOUNTER — Encounter: Payer: Self-pay | Admitting: Ophthalmology

## 2016-10-13 DIAGNOSIS — D1800 Hemangioma unspecified site: Secondary | ICD-10-CM | POA: Diagnosis not present

## 2016-10-13 DIAGNOSIS — L821 Other seborrheic keratosis: Secondary | ICD-10-CM | POA: Diagnosis not present

## 2016-10-13 DIAGNOSIS — L820 Inflamed seborrheic keratosis: Secondary | ICD-10-CM | POA: Diagnosis not present

## 2016-10-13 DIAGNOSIS — L918 Other hypertrophic disorders of the skin: Secondary | ICD-10-CM | POA: Diagnosis not present

## 2016-10-13 DIAGNOSIS — L812 Freckles: Secondary | ICD-10-CM | POA: Diagnosis not present

## 2016-10-13 DIAGNOSIS — Z853 Personal history of malignant neoplasm of breast: Secondary | ICD-10-CM | POA: Diagnosis not present

## 2016-10-13 DIAGNOSIS — L578 Other skin changes due to chronic exposure to nonionizing radiation: Secondary | ICD-10-CM | POA: Diagnosis not present

## 2016-10-13 DIAGNOSIS — D229 Melanocytic nevi, unspecified: Secondary | ICD-10-CM | POA: Diagnosis not present

## 2016-10-13 DIAGNOSIS — L570 Actinic keratosis: Secondary | ICD-10-CM | POA: Diagnosis not present

## 2016-10-18 ENCOUNTER — Ambulatory Visit: Payer: PPO | Admitting: Podiatry

## 2016-10-18 ENCOUNTER — Ambulatory Visit (INDEPENDENT_AMBULATORY_CARE_PROVIDER_SITE_OTHER): Payer: PPO | Admitting: Podiatry

## 2016-10-18 DIAGNOSIS — M79676 Pain in unspecified toe(s): Secondary | ICD-10-CM

## 2016-10-18 DIAGNOSIS — B351 Tinea unguium: Secondary | ICD-10-CM

## 2016-10-18 NOTE — Progress Notes (Signed)
Complaint:  Visit Type: Patient returns to my office for continued preventative foot care services. Complaint: Patient states" my nails have grown long and thick and become painful to walk and wear shoes" .  She says she is developing pain and feels something is moving on the left foot because she uses braces.  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   She also has pain at site of bunion right foot. She presents for continued foot care services.  Podiatric Exam: Vascular: dorsalis pedis and posterior tibial pulses are palpable bilateral. Capillary return is immediate. Temperature gradient is WNL. Skin turgor WNL  Sensorium: Normal Semmes Weinstein monofilament test. Normal tactile sensation bilaterally. Nail Exam: Pt has thick disfigured discolored nails with subungual debris noted bilateral entire nail hallux through fifth toenails Ulcer Exam: There is no evidence of ulcer or pre-ulcerative changes or infection. Orthopedic Exam: Muscle tone and strength are WNL. No limitations in general ROM. No crepitus or effusions noted. Foot type and digits show no abnormalities. Bony prominences are unremarkable.Liz-Frank  Arthritis  B/L.  Severe HAV with contracted digits  B/L.  Right greater than left bunion. Skin: No Porokeratosis. No infection or ulcers  Diagnosis:  Onychomycosis, , Pain in right toe, pain in left toes,  HAV left foot.  Treatment & Plan Procedures and Treatment: Consent by patient was obtained for treatment procedures. The patient understood the discussion of treatment and procedures well. All questions were answered thoroughly reviewed. Debridement of mycotic and hypertrophic toenails, 1 through 5 bilateral and clearing of subungual debris. No ulceration, no infection noted.  Return Visit-Office Procedure: Patient instructed to return to the office for a follow up visit 3 months for continued evaluation and treatment.    Gardiner Barefoot DPM

## 2016-11-08 ENCOUNTER — Ambulatory Visit
Admission: RE | Admit: 2016-11-08 | Discharge: 2016-11-08 | Disposition: A | Discharge status: Home / Self Care | Payer: PPO | Source: Ambulatory Visit | Attending: Surgery | Admitting: Surgery

## 2016-11-08 DIAGNOSIS — Z853 Personal history of malignant neoplasm of breast: Secondary | ICD-10-CM | POA: Diagnosis not present

## 2016-11-08 DIAGNOSIS — R928 Other abnormal and inconclusive findings on diagnostic imaging of breast: Secondary | ICD-10-CM | POA: Diagnosis not present

## 2016-11-08 DIAGNOSIS — Z08 Encounter for follow-up examination after completed treatment for malignant neoplasm: Secondary | ICD-10-CM | POA: Diagnosis not present

## 2016-11-08 HISTORY — DX: Personal history of irradiation: Z92.3

## 2016-11-08 HISTORY — DX: Malignant neoplasm of unspecified site of unspecified female breast: C50.919

## 2016-11-15 DIAGNOSIS — Z961 Presence of intraocular lens: Secondary | ICD-10-CM | POA: Diagnosis not present

## 2016-12-16 DIAGNOSIS — G4733 Obstructive sleep apnea (adult) (pediatric): Secondary | ICD-10-CM | POA: Diagnosis not present

## 2016-12-20 ENCOUNTER — Encounter: Payer: Self-pay | Admitting: Podiatry

## 2016-12-20 ENCOUNTER — Ambulatory Visit: Payer: PPO | Admitting: Podiatry

## 2016-12-20 DIAGNOSIS — M2010 Hallux valgus (acquired), unspecified foot: Secondary | ICD-10-CM

## 2016-12-20 DIAGNOSIS — M129 Arthropathy, unspecified: Secondary | ICD-10-CM

## 2016-12-20 DIAGNOSIS — B351 Tinea unguium: Secondary | ICD-10-CM | POA: Diagnosis not present

## 2016-12-20 DIAGNOSIS — M79676 Pain in unspecified toe(s): Secondary | ICD-10-CM

## 2016-12-20 NOTE — Progress Notes (Signed)
Complaint:  Visit Type: Patient returns to my office for continued preventative foot care services. Complaint: Patient states" my nails have grown long and thick and become painful to walk and wear shoes" .  She says she is developing pain and feels something is moving on the left foot because she uses braces.  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   She also has pain at site of bunion right foot. She presents for continued foot care services.  Podiatric Exam: Vascular: dorsalis pedis and posterior tibial pulses are palpable bilateral. Capillary return is immediate. Temperature gradient is WNL. Skin turgor WNL  Sensorium: Normal Semmes Weinstein monofilament test. Normal tactile sensation bilaterally. Nail Exam: Pt has thick disfigured discolored nails with subungual debris noted bilateral entire nail hallux through fifth toenails Ulcer Exam: There is no evidence of ulcer or pre-ulcerative changes or infection. Orthopedic Exam: Muscle tone and strength are WNL. No limitations in general ROM. No crepitus or effusions noted. Foot type and digits show no abnormalities. Bony prominences are unremarkable.Liz-Frank  Arthritis  B/L.  Severe HAV with contracted digits  B/L.  Right greater than left bunion. Skin: No Porokeratosis. No infection or ulcers  Diagnosis:  Onychomycosis, , Pain in right toe, pain in left toes,  HAV left foot.  Treatment & Plan Procedures and Treatment: Consent by patient was obtained for treatment procedures. The patient understood the discussion of treatment and procedures well. All questions were answered thoroughly reviewed. Debridement of mycotic and hypertrophic toenails, 1 through 5 bilateral and clearing of subungual debris. No ulceration, no infection noted. Patient says her right forefoot is in severe pain standing long periods.  She has severe HAV with painful contracted second toe right foot. Return Visit-Office Procedure: Patient instructed to return  to the office for a follow up visit 3 months for continued evaluation and treatment. Told patient she should make an appointment with Dr. Jacqualyn Posey for surgical consultation right foot.    Gardiner Barefoot DPM

## 2016-12-21 ENCOUNTER — Telehealth: Payer: Self-pay | Admitting: *Deleted

## 2016-12-21 NOTE — Telephone Encounter (Signed)
Pt states she has question about billing of 12/20/2016.

## 2016-12-30 ENCOUNTER — Ambulatory Visit (INDEPENDENT_AMBULATORY_CARE_PROVIDER_SITE_OTHER): Payer: PPO | Admitting: Podiatry

## 2016-12-30 ENCOUNTER — Encounter: Payer: Self-pay | Admitting: Podiatry

## 2016-12-30 DIAGNOSIS — M2010 Hallux valgus (acquired), unspecified foot: Secondary | ICD-10-CM

## 2016-12-30 DIAGNOSIS — M2040 Other hammer toe(s) (acquired), unspecified foot: Secondary | ICD-10-CM | POA: Diagnosis not present

## 2016-12-30 NOTE — Progress Notes (Signed)
Subjective: Courtney Lynn  presents the office today for concerns of a painful right second toe and she said her toe is severely bent and she put a lot of pressure to the tip of the toe which is very painful if she stands or walks for 10 minutes.  She is attempted numerous conservative treatments including, but not limited to, shoe gear modification, numerous offloading pads without any significant improvement.  She presents today to discuss to see if there is any surgical intervention that we can perform to help straighten out her second toe.  She also states that her other toes are starting to contract they are not causing any pain. Denies any systemic complaints such as fevers, chills, nausea, vomiting. No acute changes since last appointment, and no other complaints at this time.   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  Most notably the right second toe is almost at a 90 degree angle and there is tenderness along the dorsal PIPJ to the distal aspect of the toe where she is putting pressure.  There is no skin breakdown.  The other digits have mild temperature contracture however there is somewhat more flexible however the second toe is rigid. No areas of pinpoint bony tenderness or pain with vibratory sensation. MMT 5/5, ROM WNL. No edema, erythema, increase in warmth to bilateral lower extremities.  No open lesions or pre-ulcerative lesions.  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  Chart was reviewed as well as previous x-rays.- -We discussed both  conservative as well as surgical treatment options.  She would like to proceed with surgery to help straighten out the second digit.  We discussed with her because of the finding as well as digital deformities that would give her the best correction she need to have a full surgery to correct the bunion as well however she does not want to proceed with that surgery.  We discussed an attempt at the PIPJ arthroplasty of the right second digit to help take pressure off the toe to straighten out.  I discussed with her risks of doing this including, but not limited to,delayed or nonhealing, recurrence, further digital deformity.  I discussed with her this is not me the toe perfectly straight however we can help take pressure off of the toe this is probably benefit her.  Understanding the risks she wishes to proceed with the surgery.  She does do this in the office under straight local anesthesia -The incision placement as well as the postoperative course was discussed with the patient. I discussed risks of the surgery which include, but not limited to, infection, bleeding, pain, swelling, need for further surgery, delayed or nonhealing, painful or ugly scar, numbness or sensation changes, over/under correction, recurrence, transfer lesions, further deformity, hardware failure, DVT/PE, loss of toe/foot. Patient understands these risks and wishes to proceed with surgery. The surgical consent was reviewed with the patient all 3 pages were signed. No promises or guarantees were given to the outcome of the procedure. All questions were answered to the best of my ability. Before the surgery the patient was encouraged to call the office if there is any further  questions. The surgery will be performed in the office on an outpatient basis under local anesthesia.   Trula Slade DPM  -Patient encouraged to call the office with any questions, concerns, change in symptoms.

## 2017-01-06 ENCOUNTER — Ambulatory Visit (INDEPENDENT_AMBULATORY_CARE_PROVIDER_SITE_OTHER): Payer: PPO | Admitting: Podiatry

## 2017-01-06 ENCOUNTER — Encounter: Payer: Self-pay | Admitting: Podiatry

## 2017-01-06 ENCOUNTER — Ambulatory Visit (INDEPENDENT_AMBULATORY_CARE_PROVIDER_SITE_OTHER): Payer: PPO

## 2017-01-06 DIAGNOSIS — M79676 Pain in unspecified toe(s): Secondary | ICD-10-CM

## 2017-01-06 DIAGNOSIS — M2040 Other hammer toe(s) (acquired), unspecified foot: Secondary | ICD-10-CM

## 2017-01-06 MED ORDER — ACETAMINOPHEN-CODEINE #3 300-30 MG PO TABS: 1.0000 | ORAL_TABLET | Freq: Four times a day (QID) | ORAL | 0 refills | Status: DC | PRN

## 2017-01-06 MED ORDER — DOXYCYCLINE HYCLATE 100 MG PO TABS
100.0000 mg | ORAL_TABLET | Freq: Two times a day (BID) | ORAL | 0 refills | Status: DC
Start: 2017-01-06 — End: 2017-09-29

## 2017-01-06 NOTE — Patient Instructions (Signed)
Keep dressing clean, dry, intact. Do not remove  Ice/elevate  Please call with any questions or concerns at [phone]

## 2017-01-07 ENCOUNTER — Encounter: Payer: Self-pay | Admitting: Podiatry

## 2017-01-07 NOTE — Progress Notes (Signed)
Subjective: Courtney Lynn presents the office today for surgical correction of a painful bunion to the right second toe.  Her toe is been sitting almost at a 90 degree angle and she gets pain to the top as well as the tip of her toe this is been ongoing for quite some time.  She denies any recent injury or trauma she is attempted numerous conservative treatments including, but not limited to, numerous padding, offloading devices as well as shoe gear modifications and inserts.  Because of this she wishes to proceed with surgical intervention to help decrease the pressure to the toe.  She has no further questions or concerns today. Denies any systemic complaints such as fevers, chills, nausea, vomiting. No acute changes since last appointment, and no other complaints at this time.   Objective: AAO x3, NAD DP/PT pulses palpable bilaterally, CRT less than 3 seconds Significant HAV is present however the right second toe sits almost 90 degree angle at the level of the PIPJ and she has tenderness to the dorsal PIPJ as well as the distal aspect of the digit.  There is slight erythema, or irritation set up her shoes.  There is no skin breakdown identified at this time.  There is no other areas of tenderness identified. No open lesions or pre-ulcerative lesions.  No pain with calf compression, swelling, warmth, erythema  Assessment: Right second digit hammertoe deformity  Plan: -All treatment options discussed with the patient including all alternatives, risks, complications.  -Today we again discussed the surgery as well as the postoperative course as well as all alternatives, risks, complications.  The surgical consent was again reviewed and she had no further questions and she wishes to proceed.  She understands that this is not a completely straight on her toe however her goal of this is to decrease the pressure to help with the pain. -After surgical site was identified by myself and with the patient a total of 3  cc of a one-to-one mixture of 2% lidocaine plain and 0.5% Marcaine plain was infiltrated in a regional block fashion around the right second digit.  Once anesthetized the patient was brought into the surgical suite with the right lower extremity was then scrubbed, prepped, draped in the normal sterile fashion.  A pneumatic ankle tourniquet was applied prior to this and then after the foot was exsanguinated.  Tourniquet was inflated to 225 mmHg due to patient comfort.  A linear incision was planned along the dorsal aspect the right second digit overlying the PIPJ.  Incision was made with a 15 blade scalpel to the epidermis the dermis and subcutaneous tissues were then bluntly and sharply dissected making sure to retract all vital neurovascular structures.  A transverse incision was made at the level of the PIPJ along the extensor tendon was reflected proximally.  Next a sagittal saw was utilized to resect a large piece of the proximal phalanx head in order to help hold the toe in a rectus position.  The edges were rasped.  At this time the toe was sitting in a more rectus position and I feel that the position of the toe will help take pressure off the dorsal toe as well as the distal aspect.  Incision was copiously irrigated with sterile saline and hemostasis was achieved.  Extensor tendon was reapproximated with 4-0 Monocryl the skin was then closed with 4-0 nylon in simple interrupted suture fashion.  Betadine this was applied to the incision followed by dry sterile dressing.  Tourniquet was released  and there is found to be an immediate capillary refill time to all the digits.  She was found to have tolerated the procedure well any complications. -Postprocedure x-rays were discussed and obtained today.  Status post arthroplasty of the second digit.  No complicating factors. -Surgical shoe was dispensed. -Post procedure instructions were discussed -Monitor for any clinical signs or symptoms of infection and  directed to call the office immediately should any occur or go to the ER. -Follow-up in 1 week or sooner if needed -Patient encouraged to call the office with any questions, concerns, change in symptoms.   Trula Slade DPM

## 2017-01-07 NOTE — Progress Notes (Signed)
Called the patient to see how she was doing after her procedure yesterday.  She states that she is doing well she has not been having any pain.  She is been keeping her foot elevated when she can.  She denies any fevers or chills.  She has a very small amount of blood on the bandage but otherwise this is not changed in size and she is doing well.  She has no other concerns or questions.  Encouraged her to call any questions or concerns or any change in symptoms.  Trula Slade

## 2017-01-10 ENCOUNTER — Encounter: Payer: PPO | Admitting: Podiatry

## 2017-01-13 ENCOUNTER — Ambulatory Visit (INDEPENDENT_AMBULATORY_CARE_PROVIDER_SITE_OTHER): Payer: PPO | Admitting: Podiatry

## 2017-01-13 DIAGNOSIS — M2040 Other hammer toe(s) (acquired), unspecified foot: Secondary | ICD-10-CM

## 2017-01-17 ENCOUNTER — Encounter: Payer: PPO | Admitting: Podiatry

## 2017-01-18 NOTE — Progress Notes (Signed)
Subjective: Courtney Lynn is a 76 y.o. is seen today in office s/p right 2nd digit PIPJ arthroplasty preformed on 01/06/2017. They state their pain is minimal and she has not been taking any pain medication.  She is remained in the surgical shoe. she denies any recent injury or problems.. Denies any systemic complaints such as fevers, chills, nausea, vomiting. No calf pain, chest pain, shortness of breath.   Objective: General: No acute distress, AAOx3  DP/PT pulses palpable 2/4, CRT < 3 sec to all digits.  Protective sensation intact. Motor function intact.  RIGHT foot: Incision is well coapted without any evidence of dehiscence and sutures are intact. There is no surrounding erythema, ascending cellulitis, fluctuance, crepitus, malodor, drainage/purulence. There is minimaledema around the surgical site. There is no pain along the surgical site.  The toe is in a more rectus position is more flexible compared to what it was preoperatively.  Incision is healing well and signs of infection. No other areas of tenderness to bilateral lower extremities.  No other open lesions or pre-ulcerative lesions.  No pain with calf compression, swelling, warmth, erythema.   Assessment and Plan:  Status post right second digit hammertoe repair, doing well with no complications   -Treatment options discussed including all alternatives, risks, and complications -Incision is healing well there is no signs of infection.  Antibiotic ointment was applied followed by a bandage.  Keep the dressing clean, dry, intact. -The main and surgical shoe. -Ice/elevation -Pain medication as needed. -Monitor for any clinical signs or symptoms of infection and DVT/PE and directed to call the office immediately should any occur or go to the ER. -Follow-up in 1 week for possible suture removal or sooner if any problems arise. In the meantime, encouraged to call the office with any questions, concerns, change in symptoms.   Celesta Gentile, DPM

## 2017-01-21 ENCOUNTER — Encounter: Payer: Self-pay | Admitting: Podiatry

## 2017-01-21 ENCOUNTER — Ambulatory Visit (INDEPENDENT_AMBULATORY_CARE_PROVIDER_SITE_OTHER): Payer: PPO | Admitting: Podiatry

## 2017-01-21 DIAGNOSIS — M2040 Other hammer toe(s) (acquired), unspecified foot: Secondary | ICD-10-CM

## 2017-01-21 DIAGNOSIS — M79676 Pain in unspecified toe(s): Secondary | ICD-10-CM

## 2017-01-26 ENCOUNTER — Telehealth: Payer: Self-pay | Admitting: *Deleted

## 2017-01-26 ENCOUNTER — Telehealth: Payer: Self-pay | Admitting: Podiatry

## 2017-01-26 NOTE — Progress Notes (Signed)
Subjective: Courtney Lynn is a 76 y.o. is seen today in office s/p right 2nd digit PIPJ arthroplasty preformed on 01/06/2017. She presents today for possible suture removal. She has not been taking any pain medication and she has been wearing the surgical shoe.  She did bring a regular shoe hoping to go back into this today.  She has no other concerns today. Denies any systemic complaints such as fevers, chills, nausea, vomiting. No calf pain, chest pain, shortness of breath.   Objective: General: No acute distress, AAOx3  DP/PT pulses palpable 2/4, CRT < 3 sec to all digits.  Protective sensation intact. Motor function intact.  RIGHT foot: Incision is well coapted without any evidence of dehiscence and sutures are intact. There is no surrounding erythema, ascending cellulitis, fluctuance, crepitus, malodor, drainage/purulence. There is minimal edema around the surgical site. There is no pain along the surgical site.  The toe is in a more rectus position is more flexible compared to what it was preoperatively but still in a contracted position. Incision is healing well and signs of infection. No other areas of tenderness to bilateral lower extremities.  No other open lesions or pre-ulcerative lesions.  No pain with calf compression, swelling, warmth, erythema.   Assessment and Plan:  Status post right second digit hammertoe repair, doing well with no complications   -Treatment options discussed including all alternatives, risks, and complications -Sutures removed today without complications.  After removal incision remains well coapted.  Antibiotic ointment and a bandage was applied.  I want her to continue with his daily. She is hoping to go swimming tomorrow but I want her to hold off on going back into a pool for at least another week until the incision continues to heal.  She can slowly transition back into regular shoe out of the surgical shoe make this a gradual transition.  Continue ice and  elevation and limit activity to allow healing. -Monitor for any clinical signs or symptoms of infection and directed to call the office immediately should any occur or go to the ER. -Follow-up in 3 weeks or sooner if needed.  Trula Slade DPM

## 2017-01-26 NOTE — Telephone Encounter (Signed)
Courtney Lynn can you please call her. I think she is having swelling because she probably over did it. In the office she threw away her surgical shoe the day the sutures came out and went immediately into a regular shoe. She just had surgery about 2 weeks ago and some swelling is still normal. Please make sure she does not have any redness, warmth and the incision is healing well and not open.

## 2017-01-26 NOTE — Telephone Encounter (Signed)
Pt's home phone is busy.

## 2017-01-26 NOTE — Telephone Encounter (Signed)
Patient called said that Dr. Jacqualyn Posey took her stitches out from a foot surgery performed on 01/06/17 last week and now she is having swelling in her toes. Would ike a call back from his nurse about this issue. She isnt scheduled for another post op visit until 02/11/17.

## 2017-01-26 NOTE — Telephone Encounter (Signed)
Left message informing pt that if she did not have redness, swelling or the surgery site was not open then he felt she had probably overdone her initial regular shoe activity, to back off on the activity, rest, ice and elevate for the symptoms and call office to confirm understanding.

## 2017-01-27 ENCOUNTER — Telehealth: Payer: Self-pay | Admitting: *Deleted

## 2017-01-27 ENCOUNTER — Ambulatory Visit (INDEPENDENT_AMBULATORY_CARE_PROVIDER_SITE_OTHER): Payer: PPO | Admitting: Podiatry

## 2017-01-27 ENCOUNTER — Ambulatory Visit (INDEPENDENT_AMBULATORY_CARE_PROVIDER_SITE_OTHER): Payer: PPO

## 2017-01-27 ENCOUNTER — Other Ambulatory Visit: Payer: Self-pay | Admitting: Podiatry

## 2017-01-27 DIAGNOSIS — M79671 Pain in right foot: Secondary | ICD-10-CM

## 2017-01-27 DIAGNOSIS — M7750 Other enthesopathy of unspecified foot: Secondary | ICD-10-CM | POA: Diagnosis not present

## 2017-01-27 DIAGNOSIS — M2040 Other hammer toe(s) (acquired), unspecified foot: Secondary | ICD-10-CM

## 2017-01-27 NOTE — Telephone Encounter (Signed)
Tammy sent me a message yesterday about the patient and I called the patient and the patient state that the toe was swollen and red and was wearing a regular shoe and that she was doing her normal every day routine and I stated that we could see her and that she should elevate and ice and go back into her surgical shoe and the patient stated that the last visit that she did not want the surgical shoe and to throw it away and I stated to the patient at that time that she may want to keep it and patient stated that she did not want it and spoke with Dr Jacqualyn Posey and he stated to get the patient in and I spoke with Estill Bamberg and patient was scheduled today. Lattie Haw

## 2017-01-27 NOTE — Progress Notes (Signed)
Subjective: Courtney Lynn is a 76 y.o. is seen today in office s/p right 2nd digit PIPJ arthroplasty preformed on 01/06/2017.  She presents today for concerns of swelling to the right second toe.  She states that she describes an aching sensation to the toe at times.  Since I saw her she did not wear her surgical shoe and she has been doing her full activity.  She states that "I did not think that I needed to wear that shoe".  She has been doing her regular activities and walking.  She denies any recent injury or trauma.  She has no other concerns today.  Denies any systemic complaints such as fevers, chills, nausea, vomiting. No calf pain, chest pain, shortness of breath.   Objective: General: No acute distress, AAOx3  DP/PT pulses palpable 2/4, CRT < 3 sec to all digits.  Protective sensation intact. Motor function intact.  RIGHT foot: Incision is well coapted without any evidence and the scar is formed.  There is mild to moderate edema to the second toe and there is some very minimal erythema but this is likely more from inflammation as opposed to infection.  Incision is healed well there is no drainage or pus there is no ascending cellulitis.  There is no fluctuation or crepitation.  There is no increase in warmth.  There is no malodor. No other areas of tenderness to bilateral lower extremities.  No other open lesions or pre-ulcerative lesions.  No pain with calf compression, swelling, warmth, erythema.   Assessment and Plan:  Status post right second digit hammertoe repair, presents for increase in swelling.   -Treatment options discussed including all alternatives, risks, and complications -X-rays are reviewed.  Status post arthroplasty of the second digit.  There is no evidence of acute fracture.  No evidence of osteomyelitis or bone destruction -She is having some swelling to the toe which is normal this point in the postoperative course however she has been wearing a regular shoe and very  active on her feet.  I want her to take a step back and decrease her activity level.  New surgical shoe was dispensed.  I showed her how to tape the toe to help with swelling as well.  Elevation when she is been doing but she has not been icing but I want her to continue with this as well. -Follow-up in 2 weeks as scheduled or sooner if needed.  Monitor for any signs or symptoms of infection call the office immediately should any occur.  Trula Slade DPM

## 2017-01-31 NOTE — Telephone Encounter (Signed)
Error

## 2017-02-10 ENCOUNTER — Telehealth: Payer: Self-pay | Admitting: Podiatry

## 2017-02-10 NOTE — Telephone Encounter (Signed)
I spoke with pt and she states she is doing fine and wanted to know if she could get out of the appt tomorrow, that she saw him last week for swelling but it has gone down. I told her, the appt tomorrow would be considered a routine post op check rather than an emergency one and we would be able to evaluate her healing status. Pt states understanding.

## 2017-02-10 NOTE — Telephone Encounter (Signed)
I have an appointment tomorrow but I'm wondering if I really need to come because things seem very good now. I just need to find out if I need to come. Please call me back at [phone]. Thank you.

## 2017-02-11 ENCOUNTER — Ambulatory Visit (INDEPENDENT_AMBULATORY_CARE_PROVIDER_SITE_OTHER): Payer: PPO | Admitting: Podiatry

## 2017-02-11 ENCOUNTER — Encounter: Payer: Self-pay | Admitting: Podiatry

## 2017-02-11 DIAGNOSIS — I10 Essential (primary) hypertension: Secondary | ICD-10-CM | POA: Diagnosis not present

## 2017-02-11 DIAGNOSIS — M2040 Other hammer toe(s) (acquired), unspecified foot: Secondary | ICD-10-CM

## 2017-02-14 DIAGNOSIS — L820 Inflamed seborrheic keratosis: Secondary | ICD-10-CM | POA: Diagnosis not present

## 2017-02-14 DIAGNOSIS — L578 Other skin changes due to chronic exposure to nonionizing radiation: Secondary | ICD-10-CM | POA: Diagnosis not present

## 2017-02-14 DIAGNOSIS — L570 Actinic keratosis: Secondary | ICD-10-CM | POA: Diagnosis not present

## 2017-02-14 NOTE — Progress Notes (Signed)
Subjective: Courtney Lynn is a 77 y.o. is seen today in office s/p right 2nd digit PIPJ arthroplasty preformed on 01/06/2017. She states the toe is feeling much better.  She has been in the surgical shoe.  She still notices some swelling to the toe but this is also improving.  She denies any redness or warmth.  She has no new concerns.  No recent injury or trauma. Denies any systemic complaints such as fevers, chills, nausea, vomiting. No calf pain, chest pain, shortness of breath.   Objective: General: No acute distress, AAOx3  DP/PT pulses palpable 2/4, CRT < 3 sec to all digits.  Protective sensation intact. Motor function intact.  RIGHT foot: Incision is well coapted without any evidence and the scar is formed.  There is no significant tenderness palpation of the toe today.  The toe was able to be reduced into a rectus position.  There is still mild to moderate swelling to the toe but there is no erythema or increase in warmth.  There is no clinical signs of infection noted. No other areas of tenderness to bilateral lower extremities.  No other open lesions or pre-ulcerative lesions.  No pain with calf compression, swelling, warmth, erythema.   Assessment and Plan:  Status post right second digit hammertoe repair, improved  -Treatment options discussed including all alternatives, risks, and complications -At this time the toe appears to be improving.  Her pain is minimal and there is no pain on exam today.  She can slowly start to transition to regular shoe as able.  Continue with offloading pads to help with the toe.  Also I showed her how to tape the toe for swelling purposes. -I will follow-up with her next 2-3 weeks if she is having any issues.  Otherwise we will see her back for her routine care.  She agrees this plan she has no further questions or concerns.  I encouraged her to call me if there is any changes or any concerns.  Trula Slade DPM

## 2017-02-18 DIAGNOSIS — Z9989 Dependence on other enabling machines and devices: Secondary | ICD-10-CM | POA: Diagnosis not present

## 2017-02-18 DIAGNOSIS — R7303 Prediabetes: Secondary | ICD-10-CM | POA: Diagnosis not present

## 2017-02-18 DIAGNOSIS — F325 Major depressive disorder, single episode, in full remission: Secondary | ICD-10-CM | POA: Diagnosis not present

## 2017-02-18 DIAGNOSIS — K219 Gastro-esophageal reflux disease without esophagitis: Secondary | ICD-10-CM | POA: Diagnosis not present

## 2017-02-18 DIAGNOSIS — G4733 Obstructive sleep apnea (adult) (pediatric): Secondary | ICD-10-CM | POA: Diagnosis not present

## 2017-02-18 DIAGNOSIS — I10 Essential (primary) hypertension: Secondary | ICD-10-CM | POA: Diagnosis not present

## 2017-02-22 DIAGNOSIS — I272 Pulmonary hypertension, unspecified: Secondary | ICD-10-CM | POA: Diagnosis not present

## 2017-02-22 DIAGNOSIS — G4733 Obstructive sleep apnea (adult) (pediatric): Secondary | ICD-10-CM | POA: Diagnosis not present

## 2017-02-22 DIAGNOSIS — I493 Ventricular premature depolarization: Secondary | ICD-10-CM | POA: Diagnosis not present

## 2017-02-22 DIAGNOSIS — I341 Nonrheumatic mitral (valve) prolapse: Secondary | ICD-10-CM | POA: Diagnosis not present

## 2017-02-22 DIAGNOSIS — I340 Nonrheumatic mitral (valve) insufficiency: Secondary | ICD-10-CM | POA: Diagnosis not present

## 2017-02-22 DIAGNOSIS — Z9989 Dependence on other enabling machines and devices: Secondary | ICD-10-CM | POA: Diagnosis not present

## 2017-02-22 DIAGNOSIS — I10 Essential (primary) hypertension: Secondary | ICD-10-CM | POA: Diagnosis not present

## 2017-03-10 ENCOUNTER — Encounter: Payer: Self-pay | Admitting: Podiatry

## 2017-03-10 ENCOUNTER — Ambulatory Visit: Payer: PPO | Admitting: Podiatry

## 2017-03-10 DIAGNOSIS — M2010 Hallux valgus (acquired), unspecified foot: Secondary | ICD-10-CM

## 2017-03-10 DIAGNOSIS — M2040 Other hammer toe(s) (acquired), unspecified foot: Secondary | ICD-10-CM

## 2017-03-10 DIAGNOSIS — B351 Tinea unguium: Secondary | ICD-10-CM

## 2017-03-10 DIAGNOSIS — M79676 Pain in unspecified toe(s): Secondary | ICD-10-CM

## 2017-03-10 NOTE — Progress Notes (Signed)
Complaint:  Visit Type: Patient returns to my office for continued preventative foot care services. Complaint: Patient states" my nails have grown long and thick and become painful to walk and wear shoes" .  She says she is developing pain and feels something is moving on the left foot because she uses braces.  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   She also has pain at site of bunion right foot. She presents for continued foot care services. Patient had surgery on second hammer toe by Dr. Jacqualyn Posey.  Podiatric Exam: Vascular: dorsalis pedis and posterior tibial pulses are palpable bilateral. Capillary return is immediate. Temperature gradient is WNL. Skin turgor WNL  Sensorium: Normal Semmes Weinstein monofilament test. Normal tactile sensation bilaterally. Nail Exam: Pt has thick disfigured discolored nails with subungual debris noted bilateral entire nail hallux through fifth toenails Ulcer Exam: There is no evidence of ulcer or pre-ulcerative changes or infection. Orthopedic Exam: Muscle tone and strength are WNL. No limitations in general ROM. No crepitus or effusions noted. Foot type and digits show no abnormalities. Bony prominences are unremarkable.Liz-Frank  Arthritis  B/L.  Severe HAV with contracted digits  B/L.  Right greater than left bunion. Skin: No Porokeratosis. No infection or ulcers  Diagnosis:  Onychomycosis, , Pain in right toe, pain in left toes,  HAV left foot.  Treatment & Plan Procedures and Treatment: Consent by patient was obtained for treatment procedures. The patient understood the discussion of treatment and procedures well. All questions were answered thoroughly reviewed. Debridement of mycotic and hypertrophic toenails, 1 through 5 bilateral and clearing of subungual debris. No ulceration, no infection noted.   Return Visit-Office Procedure: Patient instructed to return to the office for a follow up visit 9 weeks  for continued evaluation and  treatment.     Gardiner Barefoot DPM

## 2017-03-24 ENCOUNTER — Ambulatory Visit: Payer: PPO | Admitting: Podiatry

## 2017-03-28 DIAGNOSIS — G4733 Obstructive sleep apnea (adult) (pediatric): Secondary | ICD-10-CM | POA: Diagnosis not present

## 2017-05-12 ENCOUNTER — Ambulatory Visit: Payer: PPO | Admitting: Podiatry

## 2017-05-12 ENCOUNTER — Encounter: Payer: Self-pay | Admitting: Podiatry

## 2017-05-12 DIAGNOSIS — M2010 Hallux valgus (acquired), unspecified foot: Secondary | ICD-10-CM

## 2017-05-12 DIAGNOSIS — R7303 Prediabetes: Secondary | ICD-10-CM | POA: Diagnosis not present

## 2017-05-12 DIAGNOSIS — M79676 Pain in unspecified toe(s): Secondary | ICD-10-CM

## 2017-05-12 DIAGNOSIS — B351 Tinea unguium: Secondary | ICD-10-CM | POA: Diagnosis not present

## 2017-05-12 DIAGNOSIS — I10 Essential (primary) hypertension: Secondary | ICD-10-CM | POA: Diagnosis not present

## 2017-05-12 DIAGNOSIS — M2040 Other hammer toe(s) (acquired), unspecified foot: Secondary | ICD-10-CM

## 2017-05-12 NOTE — Progress Notes (Signed)
Complaint:  Visit Type: Patient returns to my office for continued preventative foot care services. Complaint: Patient states" my nails have grown long and thick and become painful to walk and wear shoes" .  She says her feet are doing better with her braces and is pleased with her braces..   She also has pain at site of bunion right foot. She presents for continued foot care services.   Podiatric Exam: Vascular: dorsalis pedis and posterior tibial pulses are palpable bilateral. Capillary return is immediate. Temperature gradient is WNL. Skin turgor WNL  Sensorium: Normal Semmes Weinstein monofilament test. Normal tactile sensation bilaterally. Nail Exam: Pt has thick disfigured discolored nails with subungual debris noted bilateral entire nail hallux through fifth toenails Ulcer Exam: There is no evidence of ulcer or pre-ulcerative changes or infection. Orthopedic Exam: Muscle tone and strength are WNL. No limitations in general ROM. No crepitus or effusions noted. Foot type and digits show no abnormalities. Bony prominences are unremarkable.Liz-Frank  Arthritis  B/L.  Severe HAV with contracted digits  B/L.  Right greater than left bunion. Skin: No Porokeratosis. No infection or ulcers  Diagnosis:  Onychomycosis, , Pain in right toe, pain in left toes,  HAV left foot.  Treatment & Plan Procedures and Treatment: Consent by patient was obtained for treatment procedures. The patient understood the discussion of treatment and procedures well. All questions were answered thoroughly reviewed. Debridement of mycotic and hypertrophic toenails, 1 through 5 bilateral and clearing of subungual debris. No ulceration, no infection noted.   Return Visit-Office Procedure: Patient instructed to return to the office for a follow up visit 9 weeks  for continued evaluation and treatment.     Shan Padgett DPM 

## 2017-05-16 DIAGNOSIS — H04223 Epiphora due to insufficient drainage, bilateral lacrimal glands: Secondary | ICD-10-CM | POA: Diagnosis not present

## 2017-05-24 DIAGNOSIS — M8580 Other specified disorders of bone density and structure, unspecified site: Secondary | ICD-10-CM | POA: Diagnosis not present

## 2017-05-24 DIAGNOSIS — Z0000 Encounter for general adult medical examination without abnormal findings: Secondary | ICD-10-CM | POA: Diagnosis not present

## 2017-06-01 DIAGNOSIS — M8588 Other specified disorders of bone density and structure, other site: Secondary | ICD-10-CM | POA: Diagnosis not present

## 2017-06-01 DIAGNOSIS — M8580 Other specified disorders of bone density and structure, unspecified site: Secondary | ICD-10-CM | POA: Diagnosis not present

## 2017-06-28 DIAGNOSIS — G4733 Obstructive sleep apnea (adult) (pediatric): Secondary | ICD-10-CM | POA: Diagnosis not present

## 2017-07-13 DIAGNOSIS — M160 Bilateral primary osteoarthritis of hip: Secondary | ICD-10-CM | POA: Diagnosis not present

## 2017-07-13 DIAGNOSIS — M25552 Pain in left hip: Secondary | ICD-10-CM | POA: Diagnosis not present

## 2017-07-13 DIAGNOSIS — M5416 Radiculopathy, lumbar region: Secondary | ICD-10-CM | POA: Diagnosis not present

## 2017-07-13 DIAGNOSIS — M545 Low back pain: Secondary | ICD-10-CM | POA: Diagnosis not present

## 2017-07-14 DIAGNOSIS — M5416 Radiculopathy, lumbar region: Secondary | ICD-10-CM | POA: Insufficient documentation

## 2017-07-27 DIAGNOSIS — M5416 Radiculopathy, lumbar region: Secondary | ICD-10-CM | POA: Diagnosis not present

## 2017-07-27 DIAGNOSIS — M5136 Other intervertebral disc degeneration, lumbar region: Secondary | ICD-10-CM | POA: Diagnosis not present

## 2017-07-28 ENCOUNTER — Ambulatory Visit: Payer: PPO | Admitting: Podiatry

## 2017-07-28 ENCOUNTER — Encounter: Payer: Self-pay | Admitting: Podiatry

## 2017-07-28 DIAGNOSIS — B351 Tinea unguium: Secondary | ICD-10-CM

## 2017-07-28 DIAGNOSIS — M79675 Pain in left toe(s): Secondary | ICD-10-CM

## 2017-07-28 DIAGNOSIS — M79674 Pain in right toe(s): Secondary | ICD-10-CM | POA: Diagnosis not present

## 2017-07-28 NOTE — Progress Notes (Signed)
Complaint:  Visit Type: Patient returns to my office for continued preventative foot care services. Complaint: Patient states" my nails have grown long and thick and become painful to walk and wear shoes" .  She says her feet are doing better with her braces and is pleased with her braces..   She also has pain at site of bunion right foot. She presents for continued foot care services.   Podiatric Exam: Vascular: dorsalis pedis and posterior tibial pulses are palpable bilateral. Capillary return is immediate. Temperature gradient is WNL. Skin turgor WNL  Sensorium: Normal Semmes Weinstein monofilament test. Normal tactile sensation bilaterally. Nail Exam: Pt has thick disfigured discolored nails with subungual debris noted bilateral entire nail hallux through fifth toenails Ulcer Exam: There is no evidence of ulcer or pre-ulcerative changes or infection. Orthopedic Exam: Muscle tone and strength are WNL. No limitations in general ROM. No crepitus or effusions noted. Foot type and digits show no abnormalities. Bony prominences are unremarkable.Liz-Frank  Arthritis  B/L.  Severe HAV with contracted digits  B/L.  Right greater than left bunion. Skin: No Porokeratosis. No infection or ulcers  Diagnosis:  Onychomycosis, , Pain in right toe, pain in left toes,  HAV left foot.  Treatment & Plan Procedures and Treatment: Consent by patient was obtained for treatment procedures. The patient understood the discussion of treatment and procedures well. All questions were answered thoroughly reviewed. Debridement of mycotic and hypertrophic toenails, 1 through 5 bilateral and clearing of subungual debris. No ulceration, no infection noted.   Return Visit-Office Procedure: Patient instructed to return to the office for a follow up visit 9 weeks  for continued evaluation and treatment.     Akire Rennert DPM 

## 2017-08-02 DIAGNOSIS — I10 Essential (primary) hypertension: Secondary | ICD-10-CM | POA: Diagnosis not present

## 2017-08-02 DIAGNOSIS — K922 Gastrointestinal hemorrhage, unspecified: Secondary | ICD-10-CM | POA: Diagnosis not present

## 2017-08-02 DIAGNOSIS — K625 Hemorrhage of anus and rectum: Secondary | ICD-10-CM | POA: Diagnosis not present

## 2017-08-16 DIAGNOSIS — M5136 Other intervertebral disc degeneration, lumbar region: Secondary | ICD-10-CM | POA: Diagnosis not present

## 2017-08-16 DIAGNOSIS — M5416 Radiculopathy, lumbar region: Secondary | ICD-10-CM | POA: Diagnosis not present

## 2017-08-18 DIAGNOSIS — K649 Unspecified hemorrhoids: Secondary | ICD-10-CM | POA: Diagnosis not present

## 2017-08-18 DIAGNOSIS — K625 Hemorrhage of anus and rectum: Secondary | ICD-10-CM | POA: Diagnosis not present

## 2017-08-24 DIAGNOSIS — K648 Other hemorrhoids: Secondary | ICD-10-CM | POA: Diagnosis not present

## 2017-08-29 DIAGNOSIS — K648 Other hemorrhoids: Secondary | ICD-10-CM | POA: Diagnosis not present

## 2017-09-06 ENCOUNTER — Other Ambulatory Visit: Payer: Self-pay | Admitting: Physical Medicine and Rehabilitation

## 2017-09-06 DIAGNOSIS — M5442 Lumbago with sciatica, left side: Secondary | ICD-10-CM | POA: Diagnosis not present

## 2017-09-06 DIAGNOSIS — M5416 Radiculopathy, lumbar region: Secondary | ICD-10-CM

## 2017-09-06 DIAGNOSIS — M5441 Lumbago with sciatica, right side: Secondary | ICD-10-CM | POA: Diagnosis not present

## 2017-09-06 DIAGNOSIS — M5136 Other intervertebral disc degeneration, lumbar region: Secondary | ICD-10-CM | POA: Diagnosis not present

## 2017-09-06 DIAGNOSIS — M48062 Spinal stenosis, lumbar region with neurogenic claudication: Secondary | ICD-10-CM | POA: Diagnosis not present

## 2017-09-08 DIAGNOSIS — L821 Other seborrheic keratosis: Secondary | ICD-10-CM | POA: Diagnosis not present

## 2017-09-08 DIAGNOSIS — L820 Inflamed seborrheic keratosis: Secondary | ICD-10-CM | POA: Diagnosis not present

## 2017-09-26 ENCOUNTER — Ambulatory Visit
Admission: RE | Admit: 2017-09-26 | Discharge: 2017-09-26 | Disposition: A | Discharge status: Home / Self Care | Payer: PPO | Source: Ambulatory Visit | Attending: Physical Medicine and Rehabilitation | Admitting: Physical Medicine and Rehabilitation

## 2017-09-26 DIAGNOSIS — M5136 Other intervertebral disc degeneration, lumbar region: Secondary | ICD-10-CM | POA: Diagnosis not present

## 2017-09-26 DIAGNOSIS — M5416 Radiculopathy, lumbar region: Secondary | ICD-10-CM | POA: Insufficient documentation

## 2017-09-26 DIAGNOSIS — M5127 Other intervertebral disc displacement, lumbosacral region: Secondary | ICD-10-CM | POA: Insufficient documentation

## 2017-09-26 DIAGNOSIS — M5126 Other intervertebral disc displacement, lumbar region: Secondary | ICD-10-CM | POA: Diagnosis not present

## 2017-09-26 DIAGNOSIS — M545 Low back pain: Secondary | ICD-10-CM | POA: Diagnosis not present

## 2017-09-26 DIAGNOSIS — M48061 Spinal stenosis, lumbar region without neurogenic claudication: Secondary | ICD-10-CM | POA: Diagnosis not present

## 2017-09-29 ENCOUNTER — Encounter: Payer: Self-pay | Admitting: Podiatry

## 2017-09-29 ENCOUNTER — Ambulatory Visit: Payer: PPO | Admitting: Podiatry

## 2017-09-29 DIAGNOSIS — B351 Tinea unguium: Secondary | ICD-10-CM | POA: Diagnosis not present

## 2017-09-29 DIAGNOSIS — M79674 Pain in right toe(s): Secondary | ICD-10-CM | POA: Diagnosis not present

## 2017-09-29 DIAGNOSIS — M79675 Pain in left toe(s): Secondary | ICD-10-CM | POA: Diagnosis not present

## 2017-09-29 DIAGNOSIS — M79676 Pain in unspecified toe(s): Secondary | ICD-10-CM

## 2017-09-29 DIAGNOSIS — M2010 Hallux valgus (acquired), unspecified foot: Secondary | ICD-10-CM

## 2017-09-29 NOTE — Progress Notes (Signed)
Complaint:  Visit Type: Patient returns to my office for continued preventative foot care services. Complaint: Patient states" my nails have grown long and thick and become painful to walk and wear shoes" .  She says her feet are doing better with her braces and is pleased with her braces..   She also has pain at site of bunion right foot. She presents for continued foot care services.   Podiatric Exam: Vascular: dorsalis pedis and posterior tibial pulses are palpable bilateral. Capillary return is immediate. Temperature gradient is WNL. Skin turgor WNL  Sensorium: Normal Semmes Weinstein monofilament test. Normal tactile sensation bilaterally. Nail Exam: Pt has thick disfigured discolored nails with subungual debris noted bilateral entire nail hallux through fifth toenails Ulcer Exam: There is no evidence of ulcer or pre-ulcerative changes or infection. Orthopedic Exam: Muscle tone and strength are WNL. No limitations in general ROM. No crepitus or effusions noted. Foot type and digits show no abnormalities. Bony prominences are unremarkable.Liz-Frank  Arthritis  B/L.  Severe HAV with contracted digits  B/L.  Right greater than left bunion. Skin: No Porokeratosis. No infection or ulcers  Diagnosis:  Onychomycosis, , Pain in right toe, pain in left toes,  HAV left foot.  Treatment & Plan Procedures and Treatment: Consent by patient was obtained for treatment procedures. The patient understood the discussion of treatment and procedures well. All questions were answered thoroughly reviewed. Debridement of mycotic and hypertrophic toenails, 1 through 5 bilateral and clearing of subungual debris. No ulceration, no infection noted.   Return Visit-Office Procedure: Patient instructed to return to the office for a follow up visit 9 weeks  for continued evaluation and treatment.     Bentlie Catanzaro DPM 

## 2017-09-30 ENCOUNTER — Other Ambulatory Visit: Payer: Self-pay | Admitting: Internal Medicine

## 2017-09-30 DIAGNOSIS — G4733 Obstructive sleep apnea (adult) (pediatric): Secondary | ICD-10-CM | POA: Diagnosis not present

## 2017-09-30 DIAGNOSIS — Z853 Personal history of malignant neoplasm of breast: Secondary | ICD-10-CM

## 2017-10-05 DIAGNOSIS — M5416 Radiculopathy, lumbar region: Secondary | ICD-10-CM | POA: Diagnosis not present

## 2017-10-05 DIAGNOSIS — M4726 Other spondylosis with radiculopathy, lumbar region: Secondary | ICD-10-CM | POA: Diagnosis not present

## 2017-10-05 DIAGNOSIS — M5136 Other intervertebral disc degeneration, lumbar region: Secondary | ICD-10-CM | POA: Diagnosis not present

## 2017-10-05 DIAGNOSIS — M62830 Muscle spasm of back: Secondary | ICD-10-CM | POA: Diagnosis not present

## 2017-10-05 DIAGNOSIS — M48062 Spinal stenosis, lumbar region with neurogenic claudication: Secondary | ICD-10-CM | POA: Diagnosis not present

## 2017-10-17 DIAGNOSIS — D1800 Hemangioma unspecified site: Secondary | ICD-10-CM | POA: Diagnosis not present

## 2017-10-17 DIAGNOSIS — Z1283 Encounter for screening for malignant neoplasm of skin: Secondary | ICD-10-CM | POA: Diagnosis not present

## 2017-10-17 DIAGNOSIS — L812 Freckles: Secondary | ICD-10-CM | POA: Diagnosis not present

## 2017-10-17 DIAGNOSIS — D225 Melanocytic nevi of trunk: Secondary | ICD-10-CM | POA: Diagnosis not present

## 2017-10-17 DIAGNOSIS — D2372 Other benign neoplasm of skin of left lower limb, including hip: Secondary | ICD-10-CM | POA: Diagnosis not present

## 2017-10-17 DIAGNOSIS — L578 Other skin changes due to chronic exposure to nonionizing radiation: Secondary | ICD-10-CM | POA: Diagnosis not present

## 2017-10-17 DIAGNOSIS — L821 Other seborrheic keratosis: Secondary | ICD-10-CM | POA: Diagnosis not present

## 2017-10-17 DIAGNOSIS — L820 Inflamed seborrheic keratosis: Secondary | ICD-10-CM | POA: Diagnosis not present

## 2017-10-17 DIAGNOSIS — L918 Other hypertrophic disorders of the skin: Secondary | ICD-10-CM | POA: Diagnosis not present

## 2017-10-24 DIAGNOSIS — K625 Hemorrhage of anus and rectum: Secondary | ICD-10-CM | POA: Diagnosis not present

## 2017-10-26 DIAGNOSIS — M5136 Other intervertebral disc degeneration, lumbar region: Secondary | ICD-10-CM | POA: Diagnosis not present

## 2017-10-26 DIAGNOSIS — M5416 Radiculopathy, lumbar region: Secondary | ICD-10-CM | POA: Diagnosis not present

## 2017-10-26 DIAGNOSIS — M48062 Spinal stenosis, lumbar region with neurogenic claudication: Secondary | ICD-10-CM | POA: Diagnosis not present

## 2017-11-09 ENCOUNTER — Ambulatory Visit
Admission: RE | Admit: 2017-11-09 | Discharge: 2017-11-09 | Disposition: A | Discharge status: Home / Self Care | Payer: PPO | Source: Ambulatory Visit | Attending: Internal Medicine | Admitting: Internal Medicine

## 2017-11-09 DIAGNOSIS — Z853 Personal history of malignant neoplasm of breast: Secondary | ICD-10-CM

## 2017-11-09 DIAGNOSIS — R928 Other abnormal and inconclusive findings on diagnostic imaging of breast: Secondary | ICD-10-CM | POA: Diagnosis not present

## 2017-11-15 DIAGNOSIS — L738 Other specified follicular disorders: Secondary | ICD-10-CM | POA: Diagnosis not present

## 2017-11-16 DIAGNOSIS — I10 Essential (primary) hypertension: Secondary | ICD-10-CM | POA: Diagnosis not present

## 2017-11-23 DIAGNOSIS — G4733 Obstructive sleep apnea (adult) (pediatric): Secondary | ICD-10-CM | POA: Diagnosis not present

## 2017-11-23 DIAGNOSIS — Z9989 Dependence on other enabling machines and devices: Secondary | ICD-10-CM | POA: Diagnosis not present

## 2017-11-23 DIAGNOSIS — M810 Age-related osteoporosis without current pathological fracture: Secondary | ICD-10-CM | POA: Insufficient documentation

## 2017-11-23 DIAGNOSIS — Z23 Encounter for immunization: Secondary | ICD-10-CM | POA: Diagnosis not present

## 2017-11-23 DIAGNOSIS — M5416 Radiculopathy, lumbar region: Secondary | ICD-10-CM | POA: Diagnosis not present

## 2017-11-23 DIAGNOSIS — I10 Essential (primary) hypertension: Secondary | ICD-10-CM | POA: Diagnosis not present

## 2017-11-23 DIAGNOSIS — F325 Major depressive disorder, single episode, in full remission: Secondary | ICD-10-CM | POA: Diagnosis not present

## 2017-11-25 DIAGNOSIS — M48062 Spinal stenosis, lumbar region with neurogenic claudication: Secondary | ICD-10-CM | POA: Diagnosis not present

## 2017-11-25 DIAGNOSIS — M5136 Other intervertebral disc degeneration, lumbar region: Secondary | ICD-10-CM | POA: Diagnosis not present

## 2017-11-25 DIAGNOSIS — M5416 Radiculopathy, lumbar region: Secondary | ICD-10-CM | POA: Diagnosis not present

## 2017-12-07 ENCOUNTER — Ambulatory Visit: Admission: RE | Admit: 2017-12-07 | Payer: PPO | Source: Ambulatory Visit | Admitting: Internal Medicine

## 2017-12-07 ENCOUNTER — Encounter: Admission: RE | Payer: Self-pay | Source: Ambulatory Visit

## 2017-12-07 ENCOUNTER — Ambulatory Visit: Admit: 2017-12-07 | Payer: PPO | Admitting: Internal Medicine

## 2017-12-07 SURGERY — COLONOSCOPY WITH PROPOFOL
Anesthesia: General

## 2017-12-08 ENCOUNTER — Encounter: Payer: Self-pay | Admitting: Podiatry

## 2017-12-08 ENCOUNTER — Ambulatory Visit: Payer: PPO | Admitting: Podiatry

## 2017-12-08 DIAGNOSIS — B351 Tinea unguium: Secondary | ICD-10-CM | POA: Diagnosis not present

## 2017-12-08 DIAGNOSIS — M79674 Pain in right toe(s): Secondary | ICD-10-CM

## 2017-12-08 DIAGNOSIS — M79675 Pain in left toe(s): Secondary | ICD-10-CM

## 2017-12-08 DIAGNOSIS — M2010 Hallux valgus (acquired), unspecified foot: Secondary | ICD-10-CM

## 2017-12-08 NOTE — Progress Notes (Signed)
Complaint:  Visit Type: Patient returns to my office for continued preventative foot care services. Complaint: Patient states" my nails have grown long and thick and become painful to walk and wear shoes" .  She says her feet are doing better with her braces and is pleased with her braces..   She also has pain at site of bunion right foot. She presents for continued foot care services.   Podiatric Exam: Vascular: dorsalis pedis and posterior tibial pulses are palpable bilateral. Capillary return is immediate. Temperature gradient is WNL. Skin turgor WNL  Sensorium: Normal Semmes Weinstein monofilament test. Normal tactile sensation bilaterally. Nail Exam: Pt has thick disfigured discolored nails with subungual debris noted bilateral entire nail hallux through fifth toenails Ulcer Exam: There is no evidence of ulcer or pre-ulcerative changes or infection. Orthopedic Exam: Muscle tone and strength are WNL. No limitations in general ROM. No crepitus or effusions noted. Foot type and digits show no abnormalities. Bony prominences are unremarkable.Liz-Frank  Arthritis  B/L.  Severe HAV with contracted digits  B/L.  Right greater than left bunion. Skin: No Porokeratosis. No infection or ulcers  Diagnosis:  Onychomycosis, , Pain in right toe, pain in left toes,  HAV left foot.  Treatment & Plan Procedures and Treatment: Consent by patient was obtained for treatment procedures. The patient understood the discussion of treatment and procedures well. All questions were answered thoroughly reviewed. Debridement of mycotic and hypertrophic toenails, 1 through 5 bilateral and clearing of subungual debris. No ulceration, no infection noted.   Return Visit-Office Procedure: Patient instructed to return to the office for a follow up visit 9 weeks  for continued evaluation and treatment.     Gardiner Barefoot DPM

## 2017-12-14 ENCOUNTER — Encounter: Payer: Self-pay | Admitting: Podiatry

## 2017-12-14 ENCOUNTER — Ambulatory Visit (INDEPENDENT_AMBULATORY_CARE_PROVIDER_SITE_OTHER): Payer: PPO

## 2017-12-14 ENCOUNTER — Other Ambulatory Visit: Payer: Self-pay | Admitting: Podiatry

## 2017-12-14 ENCOUNTER — Ambulatory Visit: Payer: PPO | Admitting: Podiatry

## 2017-12-14 DIAGNOSIS — M7671 Peroneal tendinitis, right leg: Secondary | ICD-10-CM

## 2017-12-14 DIAGNOSIS — M79671 Pain in right foot: Secondary | ICD-10-CM

## 2017-12-14 MED ORDER — TRIAMCINOLONE ACETONIDE 10 MG/ML IJ SUSP
10.0000 mg | Freq: Once | INTRAMUSCULAR | Status: AC
Start: 2017-12-14 — End: 2017-12-14
  Administered 2017-12-14: 10 mg

## 2017-12-14 NOTE — Progress Notes (Signed)
Subjective:   Patient ID: Courtney Lynn, female   DOB: 77 y.o.   MRN: [id number]   HPI Patient states she started developed some severe pain in the outside of the right foot and having trouble bearing weight and is wearing the boot that she had at home currently   ROS      Objective:  Physical Exam  Patient is found to have significant discomfort lateral side right foot with moderate pain dorsal that is very tender when palpated     Assessment:  Lateral tendinitis with possibility for injury with possibility for fracture even though swelling does not seem to be significant     Plan:  H&P condition reviewed and at this point reviewed x-ray.  I did do a careful injection lateral side of the foot 3 mg Dexasone Kenalog 5 Milgram Xylocaine advised on reduced activity continued boot usage and discussed that could possibly be a subtle fracture will have to reevaluated if pain persist over the next few weeks.  Reappoint if symptoms continue  X-ray indicates significant arthritis at the metatarsal bases right no indication currently of an acute fracture

## 2017-12-15 ENCOUNTER — Telehealth: Payer: Self-pay | Admitting: Podiatry

## 2017-12-15 DIAGNOSIS — L98499 Non-pressure chronic ulcer of skin of other sites with unspecified severity: Secondary | ICD-10-CM | POA: Diagnosis not present

## 2017-12-15 DIAGNOSIS — K6289 Other specified diseases of anus and rectum: Secondary | ICD-10-CM | POA: Diagnosis not present

## 2017-12-15 DIAGNOSIS — K641 Second degree hemorrhoids: Secondary | ICD-10-CM | POA: Diagnosis not present

## 2017-12-15 DIAGNOSIS — K649 Unspecified hemorrhoids: Secondary | ICD-10-CM | POA: Insufficient documentation

## 2017-12-15 DIAGNOSIS — L820 Inflamed seborrheic keratosis: Secondary | ICD-10-CM | POA: Diagnosis not present

## 2017-12-15 DIAGNOSIS — D485 Neoplasm of uncertain behavior of skin: Secondary | ICD-10-CM | POA: Diagnosis not present

## 2017-12-15 NOTE — Telephone Encounter (Signed)
Patient called to inform Dr Paulla Dolly. Patient received injection yesterday, 11/13 and her foot felt better for the remainder of the day. Today the pain is back and feels exactly like it did prior to the injection.

## 2017-12-15 NOTE — Telephone Encounter (Signed)
I informed pt that she may be having a steroid flare, to rest, ice at least 3-4 times daily and use antiinflammatory such as meloxicam and if needs additional pain coverage if tolerates use regular strength tylenol as directed. Pt states understanding.

## 2017-12-23 DIAGNOSIS — I10 Essential (primary) hypertension: Secondary | ICD-10-CM | POA: Diagnosis not present

## 2017-12-23 DIAGNOSIS — R51 Headache: Secondary | ICD-10-CM | POA: Diagnosis not present

## 2017-12-27 ENCOUNTER — Other Ambulatory Visit: Payer: Self-pay | Admitting: Family Medicine

## 2017-12-27 DIAGNOSIS — I10 Essential (primary) hypertension: Secondary | ICD-10-CM | POA: Diagnosis not present

## 2017-12-27 DIAGNOSIS — R51 Headache: Principal | ICD-10-CM

## 2017-12-27 DIAGNOSIS — R519 Headache, unspecified: Secondary | ICD-10-CM

## 2018-01-05 DIAGNOSIS — I10 Essential (primary) hypertension: Secondary | ICD-10-CM | POA: Diagnosis not present

## 2018-01-06 DIAGNOSIS — G4733 Obstructive sleep apnea (adult) (pediatric): Secondary | ICD-10-CM | POA: Diagnosis not present

## 2018-01-15 ENCOUNTER — Ambulatory Visit
Admission: RE | Admit: 2018-01-15 | Discharge: 2018-01-15 | Disposition: A | Discharge status: Home / Self Care | Payer: PPO | Source: Ambulatory Visit | Attending: Family Medicine | Admitting: Family Medicine

## 2018-01-15 DIAGNOSIS — R519 Headache, unspecified: Secondary | ICD-10-CM

## 2018-01-15 DIAGNOSIS — R51 Headache: Secondary | ICD-10-CM | POA: Insufficient documentation

## 2018-01-24 DIAGNOSIS — G4733 Obstructive sleep apnea (adult) (pediatric): Secondary | ICD-10-CM | POA: Diagnosis not present

## 2018-02-03 ENCOUNTER — Ambulatory Visit
Admission: RE | Admit: 2018-02-03 | Discharge: 2018-02-03 | Disposition: A | Discharge status: Home / Self Care | Payer: PPO | Source: Ambulatory Visit | Attending: Family Medicine | Admitting: Family Medicine

## 2018-02-03 DIAGNOSIS — R51 Headache: Secondary | ICD-10-CM | POA: Insufficient documentation

## 2018-02-15 DIAGNOSIS — L821 Other seborrheic keratosis: Secondary | ICD-10-CM | POA: Diagnosis not present

## 2018-02-15 DIAGNOSIS — Z872 Personal history of diseases of the skin and subcutaneous tissue: Secondary | ICD-10-CM | POA: Diagnosis not present

## 2018-02-16 ENCOUNTER — Encounter: Payer: Self-pay | Admitting: Podiatry

## 2018-02-16 ENCOUNTER — Ambulatory Visit (INDEPENDENT_AMBULATORY_CARE_PROVIDER_SITE_OTHER): Payer: PPO | Admitting: Podiatry

## 2018-02-16 DIAGNOSIS — M79676 Pain in unspecified toe(s): Secondary | ICD-10-CM | POA: Diagnosis not present

## 2018-02-16 DIAGNOSIS — M79674 Pain in right toe(s): Principal | ICD-10-CM

## 2018-02-16 DIAGNOSIS — B351 Tinea unguium: Secondary | ICD-10-CM | POA: Diagnosis not present

## 2018-02-16 DIAGNOSIS — M79675 Pain in left toe(s): Principal | ICD-10-CM

## 2018-02-16 NOTE — Progress Notes (Signed)
Complaint:  Visit Type: Patient returns to my office for continued preventative foot care services. Complaint: Patient states" my nails have grown long and thick and become painful to walk and wear shoes" .      She presents for continued foot care services.   Podiatric Exam: Vascular: dorsalis pedis and posterior tibial pulses are palpable bilateral. Capillary return is immediate. Temperature gradient is WNL. Skin turgor WNL  Sensorium: Normal Semmes Weinstein monofilament test. Normal tactile sensation bilaterally. Nail Exam: Pt has thick disfigured discolored nails with subungual debris noted bilateral entire nail hallux through fifth toenails Ulcer Exam: There is no evidence of ulcer or pre-ulcerative changes or infection. Orthopedic Exam: Muscle tone and strength are WNL. No limitations in general ROM. No crepitus or effusions noted. Foot type and digits show no abnormalities. Bony prominences are unremarkable.Liz-Frank  Arthritis  B/L.  Severe HAV with contracted digits  B/L.  Right greater than left bunion. Skin: No Porokeratosis. No infection or ulcers  Diagnosis:  Onychomycosis, , Pain in right toe, pain in left toes,  HAV left foot.  Treatment & Plan Procedures and Treatment: Consent by patient was obtained for treatment procedures. The patient understood the discussion of treatment and procedures well. All questions were answered thoroughly reviewed. Debridement of mycotic and hypertrophic toenails, 1 through 5 bilateral and clearing of subungual debris. No ulceration, no infection noted.   Return Visit-Office Procedure: Patient instructed to return to the office for a follow up visit 9 weeks  for continued evaluation and treatment.     Jennine Peddy DPM 

## 2018-02-22 DIAGNOSIS — I10 Essential (primary) hypertension: Secondary | ICD-10-CM | POA: Diagnosis not present

## 2018-02-24 DIAGNOSIS — R413 Other amnesia: Secondary | ICD-10-CM | POA: Diagnosis not present

## 2018-02-24 DIAGNOSIS — I10 Essential (primary) hypertension: Secondary | ICD-10-CM | POA: Diagnosis not present

## 2018-02-27 DIAGNOSIS — M4726 Other spondylosis with radiculopathy, lumbar region: Secondary | ICD-10-CM | POA: Diagnosis not present

## 2018-02-27 DIAGNOSIS — M48062 Spinal stenosis, lumbar region with neurogenic claudication: Secondary | ICD-10-CM | POA: Diagnosis not present

## 2018-02-27 DIAGNOSIS — M62830 Muscle spasm of back: Secondary | ICD-10-CM | POA: Diagnosis not present

## 2018-02-27 DIAGNOSIS — M5136 Other intervertebral disc degeneration, lumbar region: Secondary | ICD-10-CM | POA: Diagnosis not present

## 2018-02-27 DIAGNOSIS — M5416 Radiculopathy, lumbar region: Secondary | ICD-10-CM | POA: Diagnosis not present

## 2018-03-01 DIAGNOSIS — I340 Nonrheumatic mitral (valve) insufficiency: Secondary | ICD-10-CM | POA: Diagnosis not present

## 2018-03-01 DIAGNOSIS — I341 Nonrheumatic mitral (valve) prolapse: Secondary | ICD-10-CM | POA: Diagnosis not present

## 2018-03-01 DIAGNOSIS — I10 Essential (primary) hypertension: Secondary | ICD-10-CM | POA: Diagnosis not present

## 2018-03-01 DIAGNOSIS — I493 Ventricular premature depolarization: Secondary | ICD-10-CM | POA: Diagnosis not present

## 2018-03-01 DIAGNOSIS — Z9989 Dependence on other enabling machines and devices: Secondary | ICD-10-CM | POA: Diagnosis not present

## 2018-03-01 DIAGNOSIS — R001 Bradycardia, unspecified: Secondary | ICD-10-CM | POA: Diagnosis not present

## 2018-03-01 DIAGNOSIS — I272 Pulmonary hypertension, unspecified: Secondary | ICD-10-CM | POA: Diagnosis not present

## 2018-03-01 DIAGNOSIS — G4733 Obstructive sleep apnea (adult) (pediatric): Secondary | ICD-10-CM | POA: Diagnosis not present

## 2018-03-01 DIAGNOSIS — I2729 Other secondary pulmonary hypertension: Secondary | ICD-10-CM | POA: Diagnosis not present

## 2018-03-12 ENCOUNTER — Emergency Department
Admission: EM | Admit: 2018-03-12 | Discharge: 2018-03-12 | Disposition: A | Discharge status: Home / Self Care | Payer: PPO | Attending: Emergency Medicine | Admitting: Emergency Medicine

## 2018-03-12 ENCOUNTER — Emergency Department: Payer: PPO

## 2018-03-12 DIAGNOSIS — Z79899 Other long term (current) drug therapy: Secondary | ICD-10-CM | POA: Insufficient documentation

## 2018-03-12 DIAGNOSIS — W01198A Fall on same level from slipping, tripping and stumbling with subsequent striking against other object, initial encounter: Secondary | ICD-10-CM | POA: Insufficient documentation

## 2018-03-12 DIAGNOSIS — Y9389 Activity, other specified: Secondary | ICD-10-CM | POA: Insufficient documentation

## 2018-03-12 DIAGNOSIS — R58 Hemorrhage, not elsewhere classified: Secondary | ICD-10-CM | POA: Diagnosis not present

## 2018-03-12 DIAGNOSIS — R001 Bradycardia, unspecified: Secondary | ICD-10-CM | POA: Diagnosis not present

## 2018-03-12 DIAGNOSIS — R42 Dizziness and giddiness: Secondary | ICD-10-CM | POA: Diagnosis not present

## 2018-03-12 DIAGNOSIS — Z87891 Personal history of nicotine dependence: Secondary | ICD-10-CM | POA: Insufficient documentation

## 2018-03-12 DIAGNOSIS — S0990XA Unspecified injury of head, initial encounter: Secondary | ICD-10-CM | POA: Diagnosis not present

## 2018-03-12 DIAGNOSIS — I10 Essential (primary) hypertension: Secondary | ICD-10-CM | POA: Diagnosis not present

## 2018-03-12 DIAGNOSIS — Z23 Encounter for immunization: Secondary | ICD-10-CM | POA: Diagnosis not present

## 2018-03-12 DIAGNOSIS — S7001XA Contusion of right hip, initial encounter: Secondary | ICD-10-CM | POA: Diagnosis not present

## 2018-03-12 DIAGNOSIS — S199XXA Unspecified injury of neck, initial encounter: Secondary | ICD-10-CM | POA: Diagnosis not present

## 2018-03-12 DIAGNOSIS — Y92002 Bathroom of unspecified non-institutional (private) residence single-family (private) house as the place of occurrence of the external cause: Secondary | ICD-10-CM | POA: Diagnosis not present

## 2018-03-12 DIAGNOSIS — Z853 Personal history of malignant neoplasm of breast: Secondary | ICD-10-CM | POA: Diagnosis not present

## 2018-03-12 DIAGNOSIS — I959 Hypotension, unspecified: Secondary | ICD-10-CM | POA: Diagnosis not present

## 2018-03-12 DIAGNOSIS — R52 Pain, unspecified: Secondary | ICD-10-CM | POA: Diagnosis not present

## 2018-03-12 DIAGNOSIS — S0101XA Laceration without foreign body of scalp, initial encounter: Secondary | ICD-10-CM | POA: Insufficient documentation

## 2018-03-12 DIAGNOSIS — Y999 Unspecified external cause status: Secondary | ICD-10-CM | POA: Diagnosis not present

## 2018-03-12 DIAGNOSIS — Z96651 Presence of right artificial knee joint: Secondary | ICD-10-CM | POA: Diagnosis not present

## 2018-03-12 DIAGNOSIS — S79911A Unspecified injury of right hip, initial encounter: Secondary | ICD-10-CM | POA: Diagnosis not present

## 2018-03-12 MED ORDER — TETANUS-DIPHTH-ACELL PERTUSSIS 5-2.5-18.5 LF-MCG/0.5 IM SUSP
0.5000 mL | Freq: Once | INTRAMUSCULAR | Status: AC
  Administered 2018-03-12: 0.5 mL via INTRAMUSCULAR
  Filled 2018-03-12: qty 0.5

## 2018-03-12 MED ORDER — TETANUS-DIPHTH-ACELL PERTUSSIS 5-2.5-18.5 LF-MCG/0.5 IM SUSP: 0.5000 mL | Freq: Once | INTRAMUSCULAR | Status: DC

## 2018-03-12 MED ORDER — LIDOCAINE-EPINEPHRINE 2 %-1:100000 IJ SOLN
20.0000 mL | Freq: Once | INTRAMUSCULAR | Status: AC
  Administered 2018-03-12: 20 mL
  Filled 2018-03-12: qty 1

## 2018-03-12 MED ORDER — TRAMADOL HCL 50 MG PO TABS
50.0000 mg | ORAL_TABLET | ORAL | Status: AC
  Administered 2018-03-12: 50 mg via ORAL
  Filled 2018-03-12: qty 1

## 2018-03-12 NOTE — ED Notes (Signed)
Wound cleaned and irrigated per Quale MD order.

## 2018-03-12 NOTE — ED Triage Notes (Signed)
Pt presents via EMS from [facility] s/p fall. Pt denies LOC. Hit head on shower per report. Laceration noted to back of head, bleeding controlled. Pt c/o right leg pain below hip. Pt reports fell while ambulating to restroom.

## 2018-03-12 NOTE — ED Provider Notes (Signed)
[facility] Emergency Department Provider Note   ____________________________________________   First MD Initiated Contact with Patient 03/12/18 0801     (approximate)  I have reviewed the triage vital signs and the nursing notes.   HISTORY  Chief Complaint Fall    HPI Courtney Lynn is a 78 y.o. female previous history of mild mitral regurgitation and pulmonary hypertension, breast cancer, bradycardia, hypertension, etc.  Patient presents after a fall.  Patient reports she was getting ready for church.  She was walking in her bathroom when her sock slipped on the floor, this caused her to fall onto her right flank and hip region where she is having pain which she describes as moderate in the right hip area but still able to move it well.  No numbness tingling or weakness.  She also reports she struck her head when she fell on the edge of the entry to the shower which caused a about 1 inch cut over the back right side of her head.  Reports that area is also moderately tender.  No other injury.  Denies any chest pain, trouble breathing, or feeling like she was going to pass out.  Reports she definitely slipped on the floor causing the fall today.  She is otherwise been in her normal health, without any recent illness or sickness symptoms.     Past Medical History:  Diagnosis Date  . Anemia   . Arthritis   . Body mass index (bmi) 37.0-37.9, adult   . Breast cancer ([place]) 01/02/2016   right breast/radiation  . Cancer ([place]) 2017   BREAST  . Chronic insomnia   . Complication of anesthesia    has difficulty last surgery intubating  . Depression   . Difficult intubation    WITH TKR  . Dizziness   . Dysrhythmia    PVC'S,BRADYCARDIA  . GERD (gastroesophageal reflux disease)   . History of chicken pox   . Hypertension   . Hyponatremia   . Intermittent vertigo   . Irregular heart beat   . Mild mitral regurgitation   . 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[place])     . MVP (mitral valve prolapse)   . Orthopnea   . Personal history of radiation therapy 2017  . Pre-diabetes   . PVC's (premature ventricular contractions)   . Reflux   . Sleep apnea    CPAP    Patient Active Problem List   Diagnosis Date Noted  . Postmenopausal osteoporosis 11/23/2017  . Lumbar radiculopathy 07/14/2017  . Osteopenia of the elderly 05/24/2017  . Dizziness 12/13/2015  . Intermittent vertigo 12/13/2015  . 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 ([place]) 12/07/2015  . Fracture of left foot 10/26/2015  . OSA on CPAP 04/14/2015  . Nonrheumatic mitral valve insufficiency 09/10/2014  . 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[place]) 09/10/2014  . Nonrheumatic mitral valve prolapse 09/10/2014  . Premature ventricular contractions 09/10/2014  . Other secondary pulmonary hypertension ([place]) 09/10/2014  . Ventricular premature depolarization 09/10/2014  . 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 Hypertension 07/01/2014  . Hyponatremia 07/01/2014  . Hyposmolality and/or hyponatremia 07/01/2014  . Chronic anemia 04/23/2014  . Anemia 04/23/2014  . Body mass index (BMI) of 37.0-37.9 in adult 03/28/2014  . Chronic insomnia 03/28/2014  . Depressive disorder 03/28/2014  . Essential hypertension 03/28/2014  . Gastroesophageal reflux disease without esophagitis 03/28/2014  . Pre-diabetes 03/28/2014  . 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 Insomnia 03/28/2014  . Depression, major, single episode, complete remission (  [place]) 03/28/2014  . Other obesity 03/28/2014  . Acute bronchitis, viral 01/30/2014    Past Surgical History:  Procedure Laterality Date  . APPENDECTOMY    . BREAST BIOPSY Right 01/02/2016   PARTIAL MASTECTOMY- IMC and DCIS  . BREAST LUMPECTOMY Right 2017  . CATARACT EXTRACTION W/PHACO Right 09/14/2016   Procedure: CATARACT EXTRACTION PHACO AND INTRAOCULAR LENS PLACEMENT (IOC);  Surgeon: Birder Robson, MD;  Location: [facility] ORS;  Service: Ophthalmology;   Laterality: Right;  [place] 00:32 AP% 19.5 CDE 6.28 Fluid pack lot # [id number]  . CATARACT EXTRACTION W/PHACO Left 10/05/2016   Procedure: CATARACT EXTRACTION PHACO AND INTRAOCULAR LENS PLACEMENT (IOC);  Surgeon: Birder Robson, MD;  Location: [facility] ORS;  Service: Ophthalmology;  Laterality: Left;  [place] 00:46.9 AP% 16.0 CDE 7.48 Fluid Pack lot # [id number]  . CHOLECYSTECTOMY    . COLONOSCOPY WITH PROPOFOL N/A 08/24/2016   Procedure: COLONOSCOPY WITH PROPOFOL;  Surgeon: Lollie Sails, MD;  Location: [facility] ENDOSCOPY;  Service: Endoscopy;  Laterality: N/A;  . dental implants    . FOOT SURGERY Right   . FRACTURE SURGERY     KNEE CAP  . JOINT REPLACEMENT Right 2011   KNEE  . PARTIAL MASTECTOMY WITH NEEDLE LOCALIZATION Right 01/02/2016   Procedure: PARTIAL MASTECTOMY WITH NEEDLE LOCALIZATION;  Surgeon: Leonie Green, MD;  Location: [facility] ORS;  Service: General;  Laterality: Right;  . PATELLA FRACTURE SURGERY    . REPLACEMENT TOTAL KNEE Right   . SENTINEL NODE BIOPSY Right 01/02/2016   Procedure: SENTINEL NODE BIOPSY;  Surgeon: Leonie Green, MD;  Location: [facility] ORS;  Service: General;  Laterality: Right;  . TONSILLECTOMY      Prior to Admission medications   Medication Sig Start Date End Date Taking? Authorizing Provider  acetaminophen (TYLENOL) 325 MG tablet Take 650 mg by mouth every 6 (six) hours as needed.    [provider]  AMITIZA 8 MCG capsule TK ONE C PO BID WC 08/18/17   [provider]  amLODipine (NORVASC) 5 MG tablet TK 1 T PO ONCE D 07/22/16   [provider]  calcium carbonate (TUMS EX) 750 MG chewable tablet Chew 1 tablet by mouth daily.    [provider]  Calcium-Magnesium-Vitamin D (SUPER CAL-MAG-D PO) Take 2 tablets by mouth at bedtime. OsteoMatrix 1000 mg elemental calcium-400 mg magnesium-600 I.U. Vitamin D3    [provider]  citalopram (CELEXA) 10 MG tablet Take 10 mg by mouth every other day. In the evening     [provider]  COCONUT OIL PO Take 1 capsule by mouth 2 (two) times daily.    [provider]  docusate sodium (COLACE) 100 MG capsule Take by mouth.    [provider]  fluticasone Asencion Islam) 50 MCG/ACT nasal spray  01/16/16   [provider]  Glucosamine HCl 1000 MG TABS Take 1,000 mg by mouth daily.    [provider]  Glycerin-Hypromellose-PEG 400 ([facility] DRY EYE RELIEF) 0.2-0.2-1 % SOLN Place 1-2 drops into both eyes 2 (two) times daily.    [provider]  hydrocortisone 2.5 % cream APP AA BID. APP AFTER BM 08/18/17   [provider]  ibuprofen (ADVIL,MOTRIN) 200 MG tablet Take 200 mg by mouth every 8 (eight) hours as needed (for pain.).    [provider]  LACTASE PO Take by mouth.    [provider]  letrozole [facility]) 2.5 MG tablet Take by mouth. 04/29/16   [provider]  losartan (COZAAR) 50  MG tablet Take by mouth. 09/20/16 09/20/17  [provider]  meclizine (ANTIVERT) 12.5 MG tablet Take 12.5-25 mg by mouth 3 (three) times daily as needed for dizziness. 12/10/15   [provider]  meloxicam (MOBIC) 15 MG tablet Take 15 mg by mouth daily.     [provider]  methocarbamol (ROBAXIN) 500 MG tablet TK 1/2 TO 1 T PO QHS PRN.. 08/16/17   [provider]  metoprolol succinate (TOPROL-XL) 25 MG 24 hr tablet TAKE 1 TABLET(25 MG) BY MOUTH EVERY DAY 05/06/16   [provider]  Multiple Vitamins-Minerals (MEMORY VITE PO) Take 1 capsule by mouth 2 (two) times daily.    [provider]  mupirocin ointment (BACTROBAN) 2 % APP AA ON THE SKIN BID 11/15/17   [provider]  NON FORMULARY Take 1 capsule by mouth at bedtime. OmegaGuard (active ingredients) Fish oil 1200 mg-Omega 3 667 mg-EPA 363 MG-DHA 240 MG    [provider]  NONFORMULARY OR COMPOUNDED ITEM Take 1-2 capsules by mouth 3 (three) times daily. LunaRich X (Lunasin-soy peptide) 2  capsule in the morning, 2 capsules mid-day, and 1 capsule at bedtime.    [provider]  Nutritional Supplements (NUTRITIONAL SUPPLEMENT PO) Take 1 capsule by mouth daily. Protandim Nrf2 Synergizer Milk thistle (Silybum marianum) extract (225 mg) Bacopa (Bacopa monnieri) extract (150 mg) Ashwagandha (Withania somnifera) root (150 mg) Green tea (Camellia sinensis) extract (75 mg) Turmeric (Curcuma longa) extract (75 mg)    [provider]  omeprazole (PRILOSEC) 20 MG capsule TAKE 1 CAPSULE BY MOUTH EVERY DAY 08/19/16   [provider]  OVER THE COUNTER MEDICATION Take 1 capsule by mouth at bedtime. Joint Health Complex  (active ingredients) 60 mg Vitamin C-1.5 mg Zinc-0.2 mg Copper-0.2 mg Manganese-1500 mg Glucosamine-100 mg [place]wellia     [provider]  polyethylene glycol powder (GLYCOLAX/MIRALAX) powder Take by mouth. 10/01/16   [provider]  Probiotic Product (PROBIOTIC PO) Take 1 capsule by mouth daily.    [provider]  saccharomyces boulardii (FLORASTOR) 250 MG capsule Take 250 mg by mouth 2 (two) times daily.    [provider]  silver sulfADIAZINE (SILVADENE) 1 % cream Apply 1 application topically 2 (two) times daily. 03/23/16   Noreene Filbert, MD  Simethicone (GAS-X EXTRA STRENGTH) 125 MG CAPS Take 1 capsule by mouth every morning.    [provider]  traMADol (ULTRAM) 50 MG tablet Take 50 mg by mouth every 6 (six) hours as needed (for pain.).     [provider]    Allergies Cleocin [clindamycin hcl] and Pen vk [penicillin v]  Family History  Problem Relation Age of Onset  . GI Bleed Mother   . Diabetes Father   . Congestive Heart Failure Father   . Breast cancer Neg Hx     Social History Social History   Tobacco Use  . Smoking status: Former Research scientist (life sciences)  . Smokeless tobacco: Never Used  . Tobacco comment: Quit 1969  Substance Use Topics  . Alcohol use: Yes    Alcohol/week: 7.0 standard  drinks    Types: 7 Glasses of wine per week    Comment: OCCASIONALLY  . Drug use: No    Review of Systems Constitutional: No fever/chills Eyes: No visual changes. ENT: No sore throat.  Reports her neck feels a little bit "sore" towards the bottom area, but denies it is painful. Cardiovascular: Denies chest pain. Respiratory: Denies shortness of breath. Gastrointestinal: No abdominal pain.  Genitourinary: Negative for dysuria. Musculoskeletal: Negative for back pain.  No injury to the arms or legs except she feels a little sore, moderately bruised up as she describes it over the right hip Skin: Negative for rash. Neurological: Negative for headaches except she feels very sore over the back right side of her scalp where there is a cut, areas of focal weakness or numbness.    ____________________________________________   PHYSICAL EXAM:  VITAL SIGNS: ED Triage Vitals  Enc Vitals Group     BP 03/12/18 0804 140/61     Pulse Rate 03/12/18 0804 (!) 44     Resp 03/12/18 0804 14     Temp 03/12/18 0804 (!) 97.4 F (36.3 C)     Temp Source 03/12/18 0804 Oral     SpO2 03/12/18 0804 98 %     Weight 03/12/18 0805 182 lb (82.6 kg)     Height --      Head Circumference --      Peak Flow --      Pain Score 03/12/18 0805 9     Pain Loc --      Pain Edu? --      Excl. in [place]? --     Constitutional: Alert and oriented. Well appearing and in no acute distress. Eyes: Conjunctivae are normal. Head: Atraumatic except for a about 3 cm linear laceration over the right parietal region. Nose: No congestion/rhinnorhea. Mouth/Throat: Mucous membranes are moist. Neck: No stridor.  Cardiovascular: Normal rate, regular rhythm. Grossly normal heart sounds.  Good peripheral circulation. Respiratory: Normal respiratory effort.  No retractions. Lungs CTAB. Gastrointestinal: Soft and nontender. No distention. Musculoskeletal:  RIGHT Right upper extremity demonstrates normal strength, good use of  all muscles. No edema bruising or contusions of the right shoulder/upper arm, right elbow, right forearm / hand. Full range of motion of the right right upper extremity without pain. No evidence of trauma. Strong radial pulse. Intact median/ulnar/radial neuro-muscular exam.  LEFT Left upper extremity demonstrates normal strength, good use of all muscles. No edema bruising or contusions of the left shoulder/upper arm, left elbow, left forearm / hand. Full range of motion of the left  upper extremity without pain. No evidence of trauma. Strong radial pulse. Intact median/ulnar/radial neuro-muscular exam.  Lower Extremities  No edema. Normal DP/PT pulses bilateral with good cap refill.  Normal neuro-motor function lower extremities bilateral.  RIGHT Right lower extremity demonstrates normal strength, good use of all muscles but does report some discomfort over the lateral right hip region with range of motion. No edema bruising or contusions of the right hip, right knee, right ankle. Full range of motion of the right lower extremity with some discomfort over the right hip. No pain on axial loading. No evidence of trauma.  No rotation.  No shortening.  LEFT Left lower extremity demonstrates normal strength, good use of all muscles. No edema bruising or contusions of the hip,  knee, ankle. Full range of motion of the left lower extremity without pain. No pain on axial loading. No evidence of trauma.   Neurologic:  Normal speech and language. No gross focal neurologic deficits are appreciated.  Skin:  Skin is warm, dry and intact. No rash noted. Psychiatric: Mood and affect are normal. Speech and behavior are normal.  ____________________________________________   LABS (all labs ordered are listed, but only abnormal results are displayed)  Labs Reviewed  CBG MONITORING, ED   ____________________________________________  EKG  Reviewed and interpreted at 810 Heart rate 45 QRS  110 QTc  400 Sinus bradycardia, no evidence ischemia ____________________________________________  RADIOLOGY  Ct Head Wo Contrast  Result Date: 03/12/2018 CLINICAL DATA:  [place].  Hit head. EXAM: CT HEAD WITHOUT CONTRAST CT CERVICAL SPINE WITHOUT CONTRAST TECHNIQUE: Multidetector CT imaging of the head and cervical spine was performed following the standard protocol without intravenous contrast. Multiplanar CT image reconstructions of the cervical spine were also generated. COMPARISON:  Brain MRI 02/03/2018 FINDINGS: CT HEAD FINDINGS Brain: Stable age related cerebral atrophy, ventriculomegaly and periventricular white matter disease. No extra-axial fluid collections are identified. No CT findings for acute hemispheric infarction or intracranial hemorrhage. No mass lesions. The brainstem and cerebellum are grossly normal. Vascular: Vascular calcifications but no definite aneurysm or hyperdense vessels. Skull: No skull fracture. Sinuses/Orbits: The paranasal sinuses and mastoid air cells are clear. The globes are intact. Other: No scalp lesions or hematoma. Air in the soft tissues of the scalp at the right vertex likely from laceration. CT CERVICAL SPINE FINDINGS Alignment: Normal. Skull base and vertebrae: Degenerative cervical spondylosis with multilevel disc disease and facet disease. No acute fractures. Soft tissues and spinal canal: No prevertebral fluid or swelling. No visible canal hematoma. Disc levels: Generous spinal canal. No significant spinal stenosis. Mild multilevel foraminal stenosis due to uncinate spurring and facet disease. Upper chest: No significant findings. Other: No neck mass or adenopathy. IMPRESSION: 1. Age related cerebral atrophy, ventriculomegaly and periventricular white matter disease. 2. No acute intracranial findings or skull fracture. 3. Normal alignment of the cervical vertebral bodies. No acute fracture. Electronically Signed   By: Marijo Sanes M.D.   On: 03/12/2018 08:34   Ct  Cervical Spine Wo Contrast  Result Date: 03/12/2018 CLINICAL DATA:  [place].  Hit head. EXAM: CT HEAD WITHOUT CONTRAST CT CERVICAL SPINE WITHOUT CONTRAST TECHNIQUE: Multidetector CT imaging of the head and cervical spine was performed following the standard protocol without intravenous contrast. Multiplanar CT image reconstructions of the cervical spine were also generated. COMPARISON:  Brain MRI 02/03/2018 FINDINGS: CT HEAD FINDINGS Brain: Stable age related cerebral atrophy, ventriculomegaly and periventricular white matter disease. No extra-axial fluid collections are identified. No CT findings for acute hemispheric infarction or intracranial hemorrhage. No mass lesions. The brainstem and cerebellum are grossly normal. Vascular: Vascular calcifications but no definite aneurysm or hyperdense vessels. Skull: No skull fracture. Sinuses/Orbits: The paranasal sinuses and mastoid air cells are clear. The globes are intact. Other: No scalp lesions or hematoma. Air in the soft tissues of the scalp at the right vertex likely from laceration. CT CERVICAL SPINE FINDINGS Alignment: Normal. Skull base and vertebrae: Degenerative cervical spondylosis with multilevel disc disease and facet disease. No acute fractures. Soft tissues and spinal canal: No prevertebral fluid or swelling. No visible canal hematoma. Disc levels: Generous spinal canal. No significant spinal stenosis. Mild multilevel foraminal stenosis due to uncinate spurring and facet disease. Upper chest: No significant findings. Other: No neck mass or adenopathy. IMPRESSION: 1. Age related cerebral atrophy, ventriculomegaly and periventricular white matter disease. 2. No acute intracranial findings or skull fracture. 3. Normal alignment of the cervical vertebral bodies. No acute fracture. Electronically Signed   By: Marijo Sanes M.D.   On: 03/12/2018 08:34   Dg Hip Unilat W Or Wo Pelvis 2-3 Views Right  Result Date: 03/12/2018 CLINICAL DATA:  Pt [facility] EMS  [facility] s/p fall. Pt denies LOC. Hit head on shower per report. Laceration noted to back of head, bleeding controlled. Pt c/o right leg pain below hip. Pt reports  fell while ambulating to restroom. No prior injury. EXAM: DG HIP (WITH OR WITHOUT PELVIS) 2-3V RIGHT COMPARISON:  None. FINDINGS: There is no acute fracture or subluxation. Mild degenerative changes are seen in the LOWER lumbar spine. A coarse calcification in the RIGHT hemipelvis is consistent with fibroid. Visualized bowel gas pattern is nonobstructive. IMPRESSION: No evidence for acute abnormality. Electronically Signed   By: Nolon Nations M.D.   On: 03/12/2018 08:40        CT imaging and x-ray reviewed by me, negative for acute pathology ____________________________________________   PROCEDURES  Procedure(s) performed: Laceration  .Marland KitchenLaceration Repair Date/Time: 03/12/2018 9:15 AM Performed by: Delman Kitten, MD Authorized by: Delman Kitten, MD   Consent:    Consent obtained:  Verbal   Consent given by:  Patient   Risks discussed:  Infection, pain, retained foreign body, poor cosmetic result and poor wound healing Anesthesia (see MAR for exact dosages):    Anesthesia method:  Local infiltration   Local anesthetic:  Lidocaine 1% WITH epi (2 ml) Laceration details:    Location:  Scalp   Length (cm):  3   Depth (mm):  5 Repair type:    Repair type:  Simple Pre-procedure details:    Preparation:  Imaging obtained to evaluate for foreign bodies and patient was prepped and draped in usual sterile fashion Exploration:    Hemostasis achieved with:  Direct pressure   Wound exploration: entire depth of wound probed and visualized     Wound extent: no foreign bodies/material noted, no muscle damage noted, no tendon damage noted, no underlying fracture noted and no vascular damage noted     Contaminated: no   Treatment:    Area cleansed with:  Saline and Betadine   Amount of cleaning:  Extensive   Irrigation solution:   Sterile saline   Irrigation method:  Syringe   Visualized foreign bodies/material removed: no   Skin repair:    Repair method:  Staples   Number of staples:  7 Approximation:    Approximation:  Close Post-procedure details:    Dressing:  Sterile dressing   Patient tolerance of procedure:  Tolerated well, no immediate complications    Critical Care performed: No  ____________________________________________   INITIAL IMPRESSION / ASSESSMENT AND PLAN / ED COURSE  Pertinent labs & imaging results that were available during my care of the patient were reviewed by me and considered in my medical decision making (see chart for details).   Patient presents for evaluation after mechanical fall.  No associated systemic symptoms.  Evaluation demonstrates no evidence of major injury except for a small laceration of the right back of the scalp with normal mental status and no LOC.  This was repaired without difficulty.  Additionally she reports follow-up with her right hip area, imaging negative and no notable signs of hip fracture by exam.  After tramadol patient able to ambulate well without difficulty or discomfort, comfortable with plan for discharge and close follow-up.  Has known sinus bradycardia, documented previously by cardiology same here today.  ----------------------------------------- 10:05 AM on 03/12/2018 -----------------------------------------  After tramadol, patient able to ambulate well without distress.  Reports he just feels little sore over the right lateral thigh hip region.  Able to ambulate well, doubtful that there would be an underlying or occult fracture.  Did discuss with the patient we could obtain further imaging, but given her status I think this is extremely unlikely and she also voiced concern regarding cost of further imaging studies and  I think this is very reasonable to forego.  Discharged home, careful return precautions.  Has a follow-up visit already  scheduled with her primary care doctor for this week, also discussed her sutures and suture removal at that time.  Return precautions and treatment recommendations and follow-up discussed with the patient who is agreeable with the plan.       ____________________________________________   FINAL CLINICAL IMPRESSION(S) / ED DIAGNOSES  Final diagnoses:  Sinus bradycardia  Scalp laceration, initial encounter  Minor head injury, initial encounter  Contusion of right hip, initial encounter        Note:  This document was prepared using Dragon voice recognition software and may include unintentional dictation errors       Delman Kitten, MD 03/12/18 1229

## 2018-03-12 NOTE — ED Notes (Signed)
Ambulated in room without difficulty.

## 2018-03-12 NOTE — Discharge Instructions (Addendum)
Staples will need to be removed in 10-14 days, please discuss with your doctor at your appointment this week.

## 2018-03-14 DIAGNOSIS — R413 Other amnesia: Secondary | ICD-10-CM | POA: Diagnosis not present

## 2018-03-15 DIAGNOSIS — I10 Essential (primary) hypertension: Secondary | ICD-10-CM | POA: Diagnosis not present

## 2018-04-12 DIAGNOSIS — K6289 Other specified diseases of anus and rectum: Secondary | ICD-10-CM | POA: Diagnosis not present

## 2018-04-18 ENCOUNTER — Ambulatory Visit: Payer: PPO | Admitting: Podiatry

## 2018-04-18 ENCOUNTER — Other Ambulatory Visit: Payer: Self-pay

## 2018-04-18 ENCOUNTER — Encounter: Payer: Self-pay | Admitting: Podiatry

## 2018-04-18 VITALS — BP 155/74 | HR 56 | Temp 97.5°F | Resp 14

## 2018-04-18 DIAGNOSIS — L97511 Non-pressure chronic ulcer of other part of right foot limited to breakdown of skin: Secondary | ICD-10-CM

## 2018-04-18 DIAGNOSIS — M2040 Other hammer toe(s) (acquired), unspecified foot: Secondary | ICD-10-CM | POA: Diagnosis not present

## 2018-04-18 DIAGNOSIS — M79671 Pain in right foot: Secondary | ICD-10-CM | POA: Diagnosis not present

## 2018-04-18 MED ORDER — MUPIROCIN 2 % EX OINT: 1.0000 "application " | TOPICAL_OINTMENT | Freq: Two times a day (BID) | CUTANEOUS | 2 refills | Status: DC

## 2018-04-18 NOTE — Patient Instructions (Signed)
Wear surgical shoe at all times.  Change the bandage daily to the toe daily  Have a great week!

## 2018-04-20 DIAGNOSIS — L819 Disorder of pigmentation, unspecified: Secondary | ICD-10-CM | POA: Diagnosis not present

## 2018-04-20 DIAGNOSIS — L905 Scar conditions and fibrosis of skin: Secondary | ICD-10-CM | POA: Diagnosis not present

## 2018-04-20 DIAGNOSIS — L820 Inflamed seborrheic keratosis: Secondary | ICD-10-CM | POA: Diagnosis not present

## 2018-04-24 ENCOUNTER — Ambulatory Visit: Payer: PPO | Admitting: Podiatry

## 2018-04-24 ENCOUNTER — Encounter: Payer: Self-pay | Admitting: Podiatry

## 2018-04-24 ENCOUNTER — Other Ambulatory Visit: Payer: Self-pay

## 2018-04-24 ENCOUNTER — Telehealth: Payer: Self-pay | Admitting: Podiatry

## 2018-04-24 DIAGNOSIS — M2040 Other hammer toe(s) (acquired), unspecified foot: Secondary | ICD-10-CM | POA: Diagnosis not present

## 2018-04-24 DIAGNOSIS — M79674 Pain in right toe(s): Secondary | ICD-10-CM

## 2018-04-24 DIAGNOSIS — M79675 Pain in left toe(s): Secondary | ICD-10-CM

## 2018-04-24 DIAGNOSIS — B351 Tinea unguium: Secondary | ICD-10-CM | POA: Diagnosis not present

## 2018-04-24 NOTE — Telephone Encounter (Signed)
I called pt she stated she wanted to get hammer toe surgery, because Dr. Prudence Davidson had said the toe was not infected but was a hammer toe. I told pt we would need to have her in for a surgery consultation. Pt states she had hammer toe surgery before on another toe and did not need another consultation. I told pt she needed a consultation to discuss surgery on this toe, and transferred to scheduler.

## 2018-04-24 NOTE — Progress Notes (Addendum)
Complaint:  Visit Type: Patient returns to my office for continued preventative foot care services. Complaint: Patient states" my nails have grown long and thick and become painful to walk and wear shoes" .      She presents for continued foot care services.  She says she was seen by Dr.  Jacqualyn Posey last week who treated her red fourth toe right foot with surgical shoe.  Podiatric Exam: Vascular: dorsalis pedis and posterior tibial pulses are palpable bilateral. Capillary return is immediate. Temperature gradient is WNL. Skin turgor WNL  Sensorium: Normal Semmes Weinstein monofilament test. Normal tactile sensation bilaterally. Nail Exam: Pt has thick disfigured discolored nails with subungual debris noted bilateral entire nail hallux through fifth toenails Ulcer Exam: There is no evidence of ulcer or pre-ulcerative changes or infection. Orthopedic Exam: Muscle tone and strength are WNL. No limitations in general ROM. No crepitus or effusions noted. Foot type and digits show no abnormalities. Bony prominences are unremarkable.Liz-Frank  Arthritis  B/L.  Severe HAV with contracted digits  B/L.  Right greater than left bunion. Red fourth toe right foot in the absence of infection. Skin: No Porokeratosis. No infection or ulcers  Diagnosis:  Onychomycosis, , Pain in right toe, pain in left toes,  HAV left foot.  Treatment & Plan Procedures and Treatment: Consent by patient was obtained for treatment procedures. The patient understood the discussion of treatment and procedures well. All questions were answered thoroughly reviewed. Debridement of mycotic and hypertrophic toenails, 1 through 5 bilateral and clearing of subungual debris. No ulceration, no infection noted.  Told her to soak right foot in epsom salts.  Padding dispensed over 4th toe right foot. Return Visit-Office Procedure: Patient instructed to return to the office for a follow up visit 9 weeks  for continued preventative foot care services.   Patient called later in the day looking for Dr.  Willia Craze nurse and was interested in discussing surgery.     Gardiner Barefoot DPM

## 2018-04-24 NOTE — Telephone Encounter (Signed)
Pt wanted to relay some information about her 4th toe, right foot. She saw Dr. Prudence Davidson today in [place] and has some questions about surgery

## 2018-04-24 NOTE — Telephone Encounter (Signed)
I returned patient call and she stated that she wanted to speak with Dr. Leigh Aurora nurse regarding what Dr. Prudence Davidson discussed during her office visit today.  Please call patient when you get a chance.  Thanks

## 2018-04-24 NOTE — Progress Notes (Signed)
Subjective: 78 year old female presents the office today for concerns of a painful hammertoe on the right fourth toe.  She states that her toes were rubbing inside the shoes causing irritation she noticed a corn, wound on top of her toe as well as some redness  on the area.  She denies any drainage or pus or any red streaks. Denies any systemic complaints such as fevers, chills, nausea, vomiting. No acute changes since last appointment, and no other complaints at this time.   Objective: AAO x3, NAD DP/PT pulses palpable bilaterally, CRT less than 3 seconds Hammertoe contractures present all the toes.  On the dorsal aspect the right fourth PIPJ is an area of superficial skin breakdown.  There is mild localized edema and erythema on this area there is no ascending cellulitis.  There is no fluctuation or crepitation or malodor. No open lesions or pre-ulcerative lesions.  No pain with calf compression, swelling, warmth, erythema  Assessment: Right fourth toe wound with localized erythema; hammertoe  Plan: -All treatment options discussed with the patient including all alternatives, risks, complications.  -At this time I do recommend to wear surgical shoe.  She has this at home.  We need to avoid pressure to the area.  Prescribe mupirocin ointment to apply daily.  She was to hold off on oral antibiotics.  There is localized erythema but discussed that there is any worsening she is to call the office immediately.  She wants to have surgery to help sort out the hammertoe.  Discussed this long-term but for now we need to get this to resolve before proceeding with any surgical intervention.  -Patient encouraged to call the office with any questions, concerns, change in symptoms.   Trula Slade DPM

## 2018-04-25 NOTE — Telephone Encounter (Signed)
Here is the message you requested. Courtney Lynn

## 2018-04-25 NOTE — Telephone Encounter (Signed)
I understand that she saw Dr. Prudence Davidson yesterday. However when I saw her she had an opening in the skin and the toe was red. I was not going to do an elective surgery at that time. She was prescribed topical antibiotics and to go into a surgery shoe. She is suppose to follow up with me for a surgery consult and I explained to her she needed that wound to heel and she calm down before surgery.

## 2018-04-28 DIAGNOSIS — G4733 Obstructive sleep apnea (adult) (pediatric): Secondary | ICD-10-CM | POA: Diagnosis not present

## 2018-05-02 ENCOUNTER — Encounter: Payer: Self-pay | Admitting: Podiatry

## 2018-05-02 ENCOUNTER — Ambulatory Visit: Payer: PPO | Admitting: Podiatry

## 2018-05-02 ENCOUNTER — Other Ambulatory Visit: Payer: Self-pay

## 2018-05-02 VITALS — Temp 99.2°F

## 2018-05-02 DIAGNOSIS — M2040 Other hammer toe(s) (acquired), unspecified foot: Secondary | ICD-10-CM

## 2018-05-02 NOTE — Progress Notes (Signed)
Subjective: 78 year old female presents the office today for surgical consultation.  She states that she is continued to have pain on the top of the right fourth toe.  She did see Dr. Sharyon Cable and the wound is healed as well as the infection and she was to proceed with surgery given the ongoing pain. Denies any systemic complaints such as fevers, chills, nausea, vomiting. No acute changes since last appointment, and no other complaints at this time.   Objective: AAO x3, NAD DP/PT pulses palpable bilaterally, CRT less than 3 seconds Hammertoe contractures are present there is lateral deviation of the digits.  On the dorsal aspect the right fourth PIPJ is an area of erythema where is been rubbing inside shoes.  There is no skin breakdown today there is no increase in warmth or significant edema to the toe.  There is no ascending cellulitis.  There is no drainage or pus. No open lesions or pre-ulcerative lesions.  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  Plan: -All treatment options discussed with the patient including all alternatives, risks, complications.  -I again discussed the surgery as well as the postoperative course.  She was to proceed with surgery understanding risks.  We will plan for right fourth PIPJ arthroplasty in the office under local anesthesia only. -The incision placement as well as the postoperative course was discussed with the patient. I discussed risks of the surgery which include, but not limited to, infection, bleeding, pain, swelling, need for further surgery, delayed or nonhealing, painful or ugly scar, numbness or sensation changes, over/under correction, recurrence, transfer lesions, further deformity, hardware failure, DVT/PE, loss of toe/foot, transfer lesions. Patient understands these risks and wishes to proceed with surgery. The surgical consent was reviewed with the patient all 3  pages were signed. No promises or guarantees were given to the outcome of the procedure. All questions were answered to the best of my ability. Before the surgery the patient was encouraged to call the office if there is any further questions. The surgery will be performed in the office under local anesthesia. -Patient encouraged to call the office with any questions, concerns, change in symptoms.   Trula Slade DPM

## 2018-05-10 ENCOUNTER — Encounter: Payer: Self-pay | Admitting: Podiatry

## 2018-05-10 ENCOUNTER — Ambulatory Visit (INDEPENDENT_AMBULATORY_CARE_PROVIDER_SITE_OTHER): Payer: PPO

## 2018-05-10 ENCOUNTER — Other Ambulatory Visit: Payer: Self-pay

## 2018-05-10 ENCOUNTER — Ambulatory Visit (INDEPENDENT_AMBULATORY_CARE_PROVIDER_SITE_OTHER): Payer: PPO | Admitting: Podiatry

## 2018-05-10 VITALS — BP 148/67 | HR 67 | Temp 97.5°F

## 2018-05-10 DIAGNOSIS — M79671 Pain in right foot: Secondary | ICD-10-CM

## 2018-05-10 DIAGNOSIS — M2041 Other hammer toe(s) (acquired), right foot: Secondary | ICD-10-CM

## 2018-05-10 DIAGNOSIS — M2040 Other hammer toe(s) (acquired), unspecified foot: Secondary | ICD-10-CM

## 2018-05-10 MED ORDER — TRAMADOL HCL 50 MG PO TABS: 50.0000 mg | ORAL_TABLET | Freq: Four times a day (QID) | ORAL | 0 refills | Status: AC | PRN

## 2018-05-10 MED ORDER — ACETAMINOPHEN-CODEINE #3 300-30 MG PO TABS: 1.0000 | ORAL_TABLET | Freq: Four times a day (QID) | ORAL | 0 refills | Status: DC | PRN

## 2018-05-10 MED ORDER — DOXYCYCLINE HYCLATE 100 MG PO TABS: 100.0000 mg | ORAL_TABLET | Freq: Two times a day (BID) | ORAL | 0 refills | Status: DC

## 2018-05-15 ENCOUNTER — Ambulatory Visit (INDEPENDENT_AMBULATORY_CARE_PROVIDER_SITE_OTHER): Payer: PPO | Admitting: Podiatry

## 2018-05-15 ENCOUNTER — Ambulatory Visit: Payer: PPO

## 2018-05-15 ENCOUNTER — Encounter: Payer: Self-pay | Admitting: Podiatry

## 2018-05-15 ENCOUNTER — Other Ambulatory Visit: Payer: Self-pay

## 2018-05-15 VITALS — Temp 99.5°F

## 2018-05-15 DIAGNOSIS — Z9889 Other specified postprocedural states: Secondary | ICD-10-CM

## 2018-05-15 DIAGNOSIS — M2041 Other hammer toe(s) (acquired), right foot: Secondary | ICD-10-CM

## 2018-05-15 NOTE — Progress Notes (Signed)
This patient presents the office today 5 days postoperatively for a hammertoe correction fourth toe right foot.  This surgery was performed by Dr. Jacqualyn Posey in the [place] office.  Patient states that she is doing okay the pain has not been significant.  She presents the office today with a bandage that was applied following her surgery.  There is minimal bleeding noted on the bandage itself.  No history of fever or chills noted.  This patient presents the office today for her first postoperative visit.  Neurovascular status is is noted on her May 02, 2018 visit by Dr. Jacqualyn Posey.  Examination of the surgical site reveals good wound coaptation and sutures are intact.  No signs of redness swelling or infection.  S/P Hammer toe surgery right foot.    ROV.  After examination of the surgical site of Betadine dressing was reapplied to the right foot.  She was told to keep the bandage clean and dry.  She is to continue to walk with her surgical shoe and to return to Dr. Jacqualyn Posey for removal of the sutures in 1 week.  Gardiner Barefoot DPM

## 2018-05-15 NOTE — Progress Notes (Signed)
Subjective: 78 year old female presents the office today for surgical correction of her right fourth digit hammertoe.  She previously had a wound this area which is healed and the area still remains painful and she wants to go ahead and have a hammertoe fixed to help prevent any further wounds or skin breakdown.  He is tried offloading highly significant improvement. Denies any systemic complaints such as fevers, chills, nausea, vomiting. No acute changes since last appointment, and no other complaints at this time.   Objective: AAO x3, NAD DP/PT pulses palpable bilaterally, CRT less than 3 seconds There is lateral deviation of all the digits there is hammertoe contractures most notably of the right fourth toe.  There is erythema on the dorsal aspect of the PIPJ from irritation but there is no skin breakdown identified there is no increase in warmth or any swelling to the toe.  There is no clinical signs of infection otherwise. No open lesions or pre-ulcerative lesions.  No pain with calf compression, swelling, warmth, erythema  Assessment: 78 year old female symptomatic right fourth digit hammertoe  Plan: -All treatment options discussed with the patient including all alternatives, risks, complications.  -I again discussed the surgery as well as the postoperative course.  We discussed alternatives, risks, complications were reviewed the surgical consent that she signed last appointment.  She wishes to proceed.  The right fourth toe was prepped with alcohol and then a mixture of 3 cc of lidocaine, Marcaine plain was infiltrated in a digital block fashion.  Once anesthetized she was then brought back to the procedure room suite.  The right lower extremities and scrubbed, prepped, draped in normal sterile fashion.  Prior to this well-padded pneumatic ankle tourniquet was applied.  Next to some elliptical incisions were planned on the PIPJ excising a wedge of tissue overlying the PIPJ.  Subcutaneous  tissues were bluntly dissected as well sharply dissected.  Next extensor tendon was then transected at the level of the PIPJ as well as the medial, lateral collateral ligaments.  The head of the proximal phalanx was then identified.  A sagittal bone saw was utilized to resect the head of the proximal phalanx and the edges were rasped.  The wound was irrigated with saline.  Extensor tendon was repaired followed by the skin closed with 4-0 nylon.  At this time the toe was sitting in a rectus position.  Incision was dressed with Betadine followed by dry sterile dressing.  The tourniquet was released and there was found to be an immediate cap refill time to all the digits.  He tolerated the procedure well and complications.   -Prescribed doxycycline as well as tramadol -Postoperative instructions reviewed.  She has a surgical shoe already. -Follow-up next week for dressing change.  Encouraged to call any questions or concerns. -Patient encouraged to call the office with any questions, concerns, change in symptoms.   Trula Slade DPM

## 2018-05-16 ENCOUNTER — Other Ambulatory Visit: Payer: PPO

## 2018-05-23 ENCOUNTER — Ambulatory Visit (INDEPENDENT_AMBULATORY_CARE_PROVIDER_SITE_OTHER): Payer: PPO | Admitting: Podiatry

## 2018-05-23 ENCOUNTER — Other Ambulatory Visit: Payer: Self-pay

## 2018-05-23 VITALS — Temp 97.5°F

## 2018-05-23 DIAGNOSIS — Z9889 Other specified postprocedural states: Secondary | ICD-10-CM

## 2018-05-23 DIAGNOSIS — M2041 Other hammer toe(s) (acquired), right foot: Secondary | ICD-10-CM

## 2018-05-23 NOTE — Progress Notes (Signed)
Subjective: Courtney Lynn is a 78 y.o. is seen today in office s/p right 4th digit hammertoe repair preformed on 4/82020. She states that she is doing well and not having any pain. She is ready to get the sutures out and get back into a regular shoe. She is not having any issues. Denies any systemic complaints such as fevers, chills, nausea, vomiting. No calf pain, chest pain, shortness of breath.   Objective: General: No acute distress, AAOx3  DP/PT pulses palpable 2/4, CRT < 3 sec to all digits.  RIGHT foot: Incision is well coapted without any evidence of dehiscence with sutures intact. There is no surrounding erythema, ascending cellulitis, fluctuance, crepitus, malodor, drainage/purulence. There is minimal edema around the surgical site. There is no pain along the surgical site. The toe is in a rectus position.  No other areas of tenderness to bilateral lower extremities.  No other open lesions or pre-ulcerative lesions.  No pain with calf compression, swelling, warmth, erythema.   Assessment and Plan:  Status post right foot surgery, doing well with no complications   -Treatment options discussed including all alternatives, risks, and complications -Sutures removed with no complications.  Incision remains well coapted.  Antibiotic ointment and a Band-Aid were applied. -Ice/elevation -Pain medication as needed. -Return to shoes and gradually increase activity level.  She is eager to start walking on her toes slowly start to do this to avoid any wound complications. -Monitor for any clinical signs or symptoms of infection and DVT/PE and directed to call the office immediately should any occur or go to the ER. -Follow-up in 3 weeks if needed or sooner if any problems arise. In the meantime, encouraged to call the office with any questions, concerns, change in symptoms.   She wore a shoe out and stated that it felt great.   Celesta Gentile, DPM

## 2018-06-05 ENCOUNTER — Ambulatory Visit: Payer: PPO | Admitting: Podiatry

## 2018-06-13 ENCOUNTER — Ambulatory Visit (INDEPENDENT_AMBULATORY_CARE_PROVIDER_SITE_OTHER): Payer: PPO | Admitting: Podiatry

## 2018-06-13 ENCOUNTER — Other Ambulatory Visit: Payer: Self-pay

## 2018-06-13 ENCOUNTER — Encounter: Payer: Self-pay | Admitting: Podiatry

## 2018-06-13 VITALS — Temp 98.1°F

## 2018-06-13 DIAGNOSIS — M2041 Other hammer toe(s) (acquired), right foot: Secondary | ICD-10-CM

## 2018-06-13 DIAGNOSIS — Z9889 Other specified postprocedural states: Secondary | ICD-10-CM

## 2018-06-14 DIAGNOSIS — Z9989 Dependence on other enabling machines and devices: Secondary | ICD-10-CM | POA: Diagnosis not present

## 2018-06-14 DIAGNOSIS — G4733 Obstructive sleep apnea (adult) (pediatric): Secondary | ICD-10-CM | POA: Diagnosis not present

## 2018-06-14 DIAGNOSIS — R413 Other amnesia: Secondary | ICD-10-CM | POA: Diagnosis not present

## 2018-06-14 DIAGNOSIS — M5416 Radiculopathy, lumbar region: Secondary | ICD-10-CM | POA: Diagnosis not present

## 2018-06-14 DIAGNOSIS — F3341 Major depressive disorder, recurrent, in partial remission: Secondary | ICD-10-CM | POA: Diagnosis not present

## 2018-06-14 DIAGNOSIS — I10 Essential (primary) hypertension: Secondary | ICD-10-CM | POA: Diagnosis not present

## 2018-06-14 DIAGNOSIS — Z6835 Body mass index (BMI) 35.0-35.9, adult: Secondary | ICD-10-CM | POA: Diagnosis not present

## 2018-06-19 DIAGNOSIS — K625 Hemorrhage of anus and rectum: Secondary | ICD-10-CM | POA: Diagnosis not present

## 2018-06-19 NOTE — Progress Notes (Signed)
Subjective: Courtney Lynn is a 78 y.o. is seen today in office s/p right 4th digit hammertoe repair preformed on 4/82020.  Currently she states that she is having no pain.  She been keeping mupirocin ointment along the incision and she states this is been helpful.  She states incision is healed with mupirocin makes it feel better.  Swelling has improved.  She presents today because she has questions in regards to if there is anything we can do to help straighten out all the toes because they are all going to the side, pointing the lateral aspect.  She has no pain the other digits. Denies any systemic complaints such as fevers, chills, nausea, vomiting. No calf pain, chest pain, shortness of breath.   Objective: General: No acute distress, AAOx3  DP/PT pulses palpable 2/4, CRT < 3 sec to all digits.  RIGHT foot: Incision is well coapted without any evidence of dehiscence with sutures intact.  There is faint erythema with inflammation as well as mild swelling but there is no erythema otherwise no warmth.  No ascending cellulitis.  No drainage or crusting incision is well-healed.  No clinical signs of infection are noted today. All of the digits have a lateral deviation. No pain with calf compression, swelling, warmth, erythema.   Assessment and Plan:  Status post right foot surgery, doing well with no complications   -Treatment options discussed including all alternatives, risks, and complications -Incisions well-healed is having no pain surgical site.  Continue to ice the area as well.  Gradually increase activity level.  She is wearing regular shoes today. Unfortunately it is going to be difficult without doing surgery strain all the toes which I would not recommend at this time.  We will try to order her some toe separators.  Trula Slade DPM

## 2018-07-03 ENCOUNTER — Ambulatory Visit: Payer: PPO | Admitting: Podiatry

## 2018-07-06 ENCOUNTER — Encounter: Payer: Self-pay | Admitting: Podiatry

## 2018-07-06 ENCOUNTER — Other Ambulatory Visit: Admission: RE | Admit: 2018-07-06 | Payer: PPO | Source: Ambulatory Visit

## 2018-07-06 ENCOUNTER — Other Ambulatory Visit: Payer: Self-pay

## 2018-07-06 ENCOUNTER — Ambulatory Visit: Payer: PPO | Admitting: Podiatry

## 2018-07-06 VITALS — Temp 97.6°F

## 2018-07-06 DIAGNOSIS — M79675 Pain in left toe(s): Secondary | ICD-10-CM

## 2018-07-06 DIAGNOSIS — B351 Tinea unguium: Secondary | ICD-10-CM

## 2018-07-06 DIAGNOSIS — M79674 Pain in right toe(s): Secondary | ICD-10-CM | POA: Diagnosis not present

## 2018-07-06 DIAGNOSIS — M2040 Other hammer toe(s) (acquired), unspecified foot: Secondary | ICD-10-CM

## 2018-07-06 DIAGNOSIS — M2010 Hallux valgus (acquired), unspecified foot: Secondary | ICD-10-CM

## 2018-07-06 NOTE — Progress Notes (Signed)
Complaint:  Visit Type: Patient returns to my office for continued preventative foot care services. Complaint: Patient states" my nails have grown long and thick and become painful to walk and wear shoes" .      She presents for continued foot care services.   Podiatric Exam: Vascular: dorsalis pedis and posterior tibial pulses are palpable bilateral. Capillary return is immediate. Temperature gradient is WNL. Skin turgor WNL  Sensorium: Normal Semmes Weinstein monofilament test. Normal tactile sensation bilaterally. Nail Exam: Pt has thick disfigured discolored nails with subungual debris noted bilateral entire nail hallux through fifth toenails Ulcer Exam: There is no evidence of ulcer or pre-ulcerative changes or infection. Orthopedic Exam: Muscle tone and strength are WNL. No limitations in general ROM. No crepitus or effusions noted. Foot type and digits show no abnormalities. Bony prominences are unremarkable.Liz-Frank  Arthritis  B/L.  Severe HAV with contracted digits  B/L.  Right greater than left bunion. Skin: No Porokeratosis. No infection or ulcers  Diagnosis:  Onychomycosis, , Pain in right toe, pain in left toes,  HAV left foot.  Treatment & Plan Procedures and Treatment: Consent by patient was obtained for treatment procedures. The patient understood the discussion of treatment and procedures well. All questions were answered thoroughly reviewed. Debridement of mycotic and hypertrophic toenails, 1 through 5 bilateral and clearing of subungual debris. No ulceration, no infection noted.   Return Visit-Office Procedure: Patient instructed to return to the office for a follow up visit 9 weeks  for continued evaluation and treatment.     Gardiner Barefoot DPM

## 2018-07-07 ENCOUNTER — Other Ambulatory Visit
Admission: RE | Admit: 2018-07-07 | Discharge: 2018-07-07 | Disposition: A | Discharge status: Home / Self Care | Payer: PPO | Source: Ambulatory Visit | Attending: Internal Medicine | Admitting: Internal Medicine

## 2018-07-07 DIAGNOSIS — Z1159 Encounter for screening for other viral diseases: Secondary | ICD-10-CM | POA: Diagnosis not present

## 2018-07-07 DIAGNOSIS — Z01812 Encounter for preprocedural laboratory examination: Secondary | ICD-10-CM | POA: Insufficient documentation

## 2018-07-08 LAB — NOVEL CORONAVIRUS, NAA (HOSP ORDER, SEND-OUT TO REF LAB; TAT 18-24 HRS): SARS-CoV-2, NAA: NOT DETECTED

## 2018-07-12 ENCOUNTER — Ambulatory Visit: Payer: PPO | Admitting: Anesthesiology

## 2018-07-12 ENCOUNTER — Encounter
Admission: RE | Disposition: A | Discharge status: Home / Self Care | Payer: Self-pay | Source: Home / Self Care | Attending: Internal Medicine

## 2018-07-12 ENCOUNTER — Encounter: Payer: Self-pay | Admitting: Anesthesiology

## 2018-07-12 ENCOUNTER — Ambulatory Visit
Admission: RE | Admit: 2018-07-12 | Discharge: 2018-07-12 | Disposition: A | Discharge status: Home / Self Care | Payer: PPO | Attending: Internal Medicine | Admitting: Internal Medicine

## 2018-07-12 DIAGNOSIS — G4730 Sleep apnea, unspecified: Secondary | ICD-10-CM | POA: Insufficient documentation

## 2018-07-12 DIAGNOSIS — I493 Ventricular premature depolarization: Secondary | ICD-10-CM | POA: Insufficient documentation

## 2018-07-12 DIAGNOSIS — K644 Residual hemorrhoidal skin tags: Secondary | ICD-10-CM | POA: Diagnosis not present

## 2018-07-12 DIAGNOSIS — Z87891 Personal history of nicotine dependence: Secondary | ICD-10-CM | POA: Insufficient documentation

## 2018-07-12 DIAGNOSIS — Z79899 Other long term (current) drug therapy: Secondary | ICD-10-CM | POA: Insufficient documentation

## 2018-07-12 DIAGNOSIS — R42 Dizziness and giddiness: Secondary | ICD-10-CM | POA: Diagnosis not present

## 2018-07-12 DIAGNOSIS — E669 Obesity, unspecified: Secondary | ICD-10-CM | POA: Diagnosis not present

## 2018-07-12 DIAGNOSIS — Z6834 Body mass index (BMI) 34.0-34.9, adult: Secondary | ICD-10-CM | POA: Insufficient documentation

## 2018-07-12 DIAGNOSIS — M1990 Unspecified osteoarthritis, unspecified site: Secondary | ICD-10-CM | POA: Diagnosis not present

## 2018-07-12 DIAGNOSIS — Z853 Personal history of malignant neoplasm of breast: Secondary | ICD-10-CM | POA: Diagnosis not present

## 2018-07-12 DIAGNOSIS — R0601 Orthopnea: Secondary | ICD-10-CM | POA: Insufficient documentation

## 2018-07-12 DIAGNOSIS — Z881 Allergy status to other antibiotic agents status: Secondary | ICD-10-CM | POA: Insufficient documentation

## 2018-07-12 DIAGNOSIS — K921 Melena: Secondary | ICD-10-CM | POA: Diagnosis not present

## 2018-07-12 DIAGNOSIS — Z791 Long term (current) use of non-steroidal anti-inflammatories (NSAID): Secondary | ICD-10-CM | POA: Diagnosis not present

## 2018-07-12 DIAGNOSIS — I272 Pulmonary hypertension, unspecified: Secondary | ICD-10-CM | POA: Insufficient documentation

## 2018-07-12 DIAGNOSIS — E871 Hypo-osmolality and hyponatremia: Secondary | ICD-10-CM | POA: Insufficient documentation

## 2018-07-12 DIAGNOSIS — G4733 Obstructive sleep apnea (adult) (pediatric): Secondary | ICD-10-CM | POA: Diagnosis not present

## 2018-07-12 DIAGNOSIS — I341 Nonrheumatic mitral (valve) prolapse: Secondary | ICD-10-CM | POA: Insufficient documentation

## 2018-07-12 DIAGNOSIS — G709 Myoneural disorder, unspecified: Secondary | ICD-10-CM | POA: Insufficient documentation

## 2018-07-12 DIAGNOSIS — F5104 Psychophysiologic insomnia: Secondary | ICD-10-CM | POA: Diagnosis not present

## 2018-07-12 DIAGNOSIS — D649 Anemia, unspecified: Secondary | ICD-10-CM | POA: Insufficient documentation

## 2018-07-12 DIAGNOSIS — Z880 Allergy status to penicillin: Secondary | ICD-10-CM | POA: Diagnosis not present

## 2018-07-12 DIAGNOSIS — R7303 Prediabetes: Secondary | ICD-10-CM | POA: Insufficient documentation

## 2018-07-12 DIAGNOSIS — K641 Second degree hemorrhoids: Secondary | ICD-10-CM | POA: Diagnosis not present

## 2018-07-12 DIAGNOSIS — K648 Other hemorrhoids: Secondary | ICD-10-CM | POA: Diagnosis not present

## 2018-07-12 DIAGNOSIS — K625 Hemorrhage of anus and rectum: Secondary | ICD-10-CM | POA: Diagnosis not present

## 2018-07-12 DIAGNOSIS — Z923 Personal history of irradiation: Secondary | ICD-10-CM | POA: Diagnosis not present

## 2018-07-12 DIAGNOSIS — F329 Major depressive disorder, single episode, unspecified: Secondary | ICD-10-CM | POA: Insufficient documentation

## 2018-07-12 DIAGNOSIS — K219 Gastro-esophageal reflux disease without esophagitis: Secondary | ICD-10-CM | POA: Insufficient documentation

## 2018-07-12 DIAGNOSIS — I10 Essential (primary) hypertension: Secondary | ICD-10-CM | POA: Insufficient documentation

## 2018-07-12 HISTORY — PX: COLONOSCOPY WITH PROPOFOL: SHX5780

## 2018-07-12 SURGERY — COLONOSCOPY WITH PROPOFOL
Anesthesia: General

## 2018-07-12 MED ORDER — PROPOFOL 500 MG/50ML IV EMUL
INTRAVENOUS | Status: DC | PRN
  Administered 2018-07-12: 140 ug/kg/min via INTRAVENOUS

## 2018-07-12 MED ORDER — LIDOCAINE HCL (PF) 2 % IJ SOLN
INTRAMUSCULAR | Status: AC
  Filled 2018-07-12: qty 10

## 2018-07-12 MED ORDER — PROPOFOL 10 MG/ML IV BOLUS
INTRAVENOUS | Status: DC | PRN
  Administered 2018-07-12: 80 mg via INTRAVENOUS

## 2018-07-12 MED ORDER — PROPOFOL 10 MG/ML IV BOLUS
INTRAVENOUS | Status: AC
  Filled 2018-07-12: qty 20

## 2018-07-12 MED ORDER — SODIUM CHLORIDE 0.9 % IV SOLN
INTRAVENOUS | Status: DC
  Administered 2018-07-12: 1000 mL via INTRAVENOUS

## 2018-07-12 NOTE — Op Note (Signed)
[facility] Gastroenterology Patient Name: Courtney Lynn Procedure Date: 07/12/2018 12:24 PM MRN: [id number] Account #: [id number] Date of Birth: [birth year] Admit Type: Outpatient Age: 78 Room: [facility] ENDO ROOM 4 Gender: Female Note Status: Finalized Procedure:            Colonoscopy Indications:          Hematochezia Providers:            Benay Pike. Alice Reichert MD, MD Referring MD:         Tracie Harrier, MD (Referring MD) Medicines:            Propofol per Anesthesia Complications:        No immediate complications. Procedure:            Pre-Anesthesia Assessment:                       - The risks and benefits of the procedure and the                        sedation options and risks were discussed with the                        patient. All questions were answered and informed                        consent was obtained.                       - Patient identification and proposed procedure were                        verified prior to the procedure by the nurse. The                        procedure was verified in the procedure room.                       - ASA Grade Assessment: III - A patient with severe                        systemic disease.                       - After reviewing the risks and benefits, the patient                        was deemed in satisfactory condition to undergo the                        procedure.                       After obtaining informed consent, the colonoscope was                        passed under direct vision. Throughout the procedure,                        the patient's blood pressure, pulse, and oxygen                        saturations  were monitored continuously. The                        Colonoscope was introduced through the anus and                        advanced to the the cecum, identified by appendiceal                        orifice and ileocecal valve. The colonoscopy was                        performed  without difficulty. The patient tolerated the                        procedure well. The quality of the bowel preparation                        was good. The ileocecal valve, appendiceal orifice, and                        rectum were photographed. Findings:      The perianal exam findings include non-thrombosed external hemorrhoids       and internal hemorrhoids that prolapse with straining, but spontaneously       regress to the resting position (Grade II).      The digital rectal exam was normal. Pertinent negatives include normal       sphincter tone and no palpable rectal lesions.      The colon (entire examined portion) appeared normal.      Non-bleeding internal hemorrhoids were found during retroflexion. The       hemorrhoids were Grade I (internal hemorrhoids that do not prolapse).      The exam was otherwise without abnormality. Impression:           - Non-thrombosed external hemorrhoids and internal                        hemorrhoids that prolapse with straining, but                        spontaneously regress to the resting position (Grade                        II) found on perianal exam.                       - The entire examined colon is normal.                       - Non-bleeding internal hemorrhoids.                       - The examination was otherwise normal.                       - No specimens collected. Recommendation:       - Patient has a contact number available for                        emergencies. The signs and symptoms of potential  delayed complications were discussed with the patient.                        Return to normal activities tomorrow. Written discharge                        instructions were provided to the patient.                       - Resume previous diet.                       - Continue present medications.                       - No repeat colonoscopy due to current age (68 years or                         older) and the absence of advanced adenomas.                       - Return to physician assistant in 6 weeks.                       - The findings and recommendations were discussed with                        the patient. Procedure Code(s):    --- Professional ---                       [phone], Colonoscopy, flexible; diagnostic, including                        collection of specimen(s) by brushing or washing, when                        performed (separate procedure) Diagnosis Code(s):    --- Professional ---                       K92.1, Melena (includes Hematochezia)                       K64.4, Residual hemorrhoidal skin tags                       K64.1, Second degree hemorrhoids CPT copyright 2019 [employer]. All rights reserved. The codes documented in this report are preliminary and upon coder review may  be revised to meet current compliance requirements. Efrain Sella MD, MD 07/12/2018 12:43:31 PM This report has been signed electronically. Number of Addenda: 0 Note Initiated On: 07/12/2018 12:24 PM Scope Withdrawal Time: 0 hours 7 minutes 17 seconds  Total Procedure Duration: 0 hours 12 minutes 29 seconds       [facility]

## 2018-07-12 NOTE — Anesthesia Post-op Follow-up Note (Signed)
Anesthesia QCDR form completed.        

## 2018-07-12 NOTE — Anesthesia Preprocedure Evaluation (Signed)
Anesthesia Evaluation  Patient identified by MRN, date of birth, ID band Patient awake    Reviewed: Allergy & Precautions, NPO status , Patient's Chart, lab work & pertinent test results, reviewed documented beta blocker date and time   History of Anesthesia Complications (+) DIFFICULT AIRWAY and history of anesthetic complications  Airway Mallampati: III  TM Distance: >3 FB     Dental  (+) Chipped   Pulmonary sleep apnea , former smoker,           Cardiovascular hypertension, Pt. on medications + Orthopnea  + dysrhythmias      Neuro/Psych PSYCHIATRIC DISORDERS Depression  Neuromuscular disease    GI/Hepatic GERD  ,  Endo/Other    Renal/GU      Musculoskeletal  (+) Arthritis ,   Abdominal   Peds  Hematology  (+) anemia ,   Anesthesia Other Findings Hx of PVCs. Obese.  Reproductive/Obstetrics                             Anesthesia Physical Anesthesia Plan  ASA: III  Anesthesia Plan: General   Post-op Pain Management:    Induction: Intravenous  PONV Risk Score and Plan:   Airway Management Planned:   Additional Equipment:   Intra-op Plan:   Post-operative Plan:   Informed Consent: I have reviewed the patients History and Physical, chart, labs and discussed the procedure including the risks, benefits and alternatives for the proposed anesthesia with the patient or authorized representative who has indicated his/her understanding and acceptance.       Plan Discussed with: CRNA  Anesthesia Plan Comments:         Anesthesia Quick Evaluation

## 2018-07-12 NOTE — Transfer of Care (Signed)
Immediate Anesthesia Transfer of Care Note  Patient: Courtney Lynn  Procedure(s) Performed: COLONOSCOPY WITH PROPOFOL (N/A )  Patient Location: PACU  Anesthesia Type:General  Level of Consciousness: drowsy  Airway & Oxygen Therapy: Patient Spontanous Breathing and Patient connected to nasal cannula oxygen  Post-op Assessment: Report given to RN and Post -op Vital signs reviewed and stable  Post vital signs: Reviewed and stable  Last Vitals:  Vitals Value Taken Time  BP    Temp 36.1 C 07/12/2018 12:43 PM  Pulse 59 07/12/2018 12:44 PM  Resp 18 07/12/2018 12:44 PM  SpO2 100 % 07/12/2018 12:44 PM  Vitals shown include unvalidated device data.  Last Pain:  Vitals:   07/12/18 1243  TempSrc: Tympanic  PainSc: Asleep         Complications: No apparent anesthesia complications

## 2018-07-12 NOTE — Interval H&P Note (Signed)
History and Physical Interval Note:  07/12/2018 12:13 PM  Courtney Lynn  has presented today for surgery, with the diagnosis of Rectal Bleeding.  The various methods of treatment have been discussed with the patient and family. After consideration of risks, benefits and other options for treatment, the patient has consented to  Procedure(s): COLONOSCOPY WITH PROPOFOL (N/A) as a surgical intervention.  The patient's history has been reviewed, patient examined, no change in status, stable for surgery.  I have reviewed the patient's chart and labs.  Questions were answered to the patient's satisfaction.     [place], [place]

## 2018-07-12 NOTE — Anesthesia Procedure Notes (Signed)
Date/Time: 07/12/2018 12:26 PM Performed by: Gentry Fitz, CRNA Pre-anesthesia Checklist: Patient identified, Emergency Drugs available, Suction available and Patient being monitored Patient Re-evaluated:Patient Re-evaluated prior to induction Oxygen Delivery Method: Nasal cannula Preoxygenation: Pre-oxygenation with 100% oxygen Induction Type: IV induction

## 2018-07-12 NOTE — H&P (Signed)
Outpatient short stay form Pre-procedure 07/12/2018 12:09 PM Courtney Lynn, M.D.  Primary Physician: Tracie Harrier, M.D.  Reason for visit:  Rectal bleeding  History of present illness:  Patient presents with self limited amounts of rectal bleeding. Previous colonoscopy is 2018 was unremarkable.   No current facility-administered medications for this encounter.   Medications Prior to Admission  Medication Sig Dispense Refill Last Dose  . citalopram (CELEXA) 10 MG tablet Take 10 mg by mouth every other day. In the evening   07/11/2018 at Unknown time  . docusate sodium (COLACE) 100 MG capsule Take by mouth.   07/11/2018 at Unknown time  . donepezil (ARICEPT) 10 MG tablet Take 10 mg by mouth at bedtime.   07/11/2018 at Unknown time  . felodipine (PLENDIL) 10 MG 24 hr tablet TK 1 T PO QD. DISCONTINUE AMLODIPINE   07/11/2018 at Unknown time  . hydrALAZINE (APRESOLINE) 25 MG tablet Take by mouth.   07/11/2018 at Unknown time  . Lactase (LACTAID PO) Take by mouth.   07/11/2018 at Unknown time  . meloxicam (MOBIC) 15 MG tablet Take 15 mg by mouth daily.    07/11/2018 at Unknown time  . methocarbamol (ROBAXIN) 500 MG tablet Take 250 mg by mouth at bedtime.   07/11/2018 at Unknown time  . Multiple Vitamin (MULTIVITAMIN) tablet Take 1 tablet by mouth daily. Osteo matrix 1 a day, omega 10 1 a day   07/11/2018 at Unknown time  . Multiple Vitamins-Minerals (MEMORY VITE PO) Take 1 capsule by mouth 2 (two) times daily.   07/11/2018 at Unknown time  . mupirocin ointment (BACTROBAN) 2 % Apply 1 application topically 2 (two) times daily. 30 g 2 07/11/2018 at Unknown time  . NON FORMULARY Take 1 capsule by mouth at bedtime. OmegaGuard (active ingredients) Fish oil 1200 mg-Omega 3 667 mg-EPA 363 MG-DHA 240 MG   07/11/2018 at Unknown time  . NONFORMULARY OR COMPOUNDED ITEM Take 1-2 capsules by mouth 3 (three) times daily. LunaRich X (Lunasin-soy peptide) 2 capsule in the morning, 2 capsules mid-day, and 1 capsule at  bedtime.   07/11/2018 at Unknown time  . Nutritional Supplements (NUTRITIONAL SUPPLEMENT PO) Take 1 capsule by mouth daily. Protandim Nrf2 Synergizer Milk thistle (Silybum marianum) extract (225 mg) Bacopa (Bacopa monnieri) extract (150 mg) Ashwagandha (Withania somnifera) root (150 mg) Green tea (Camellia sinensis) extract (75 mg) Turmeric (Curcuma longa) extract (75 mg)   07/11/2018 at Unknown time  . olmesartan (BENICAR) 40 MG tablet    07/11/2018 at Unknown time  . omeprazole (PRILOSEC) 20 MG capsule TAKE 1 CAPSULE BY MOUTH EVERY DAY   07/11/2018 at Unknown time  . OVER THE COUNTER MEDICATION Take 1 capsule by mouth at bedtime. Joint Health Complex  (active ingredients) 60 mg Vitamin C-1.5 mg Zinc-0.2 mg Copper-0.2 mg Manganese-1500 mg Glucosamine-100 mg Boswellia    07/11/2018 at Unknown time  . Probiotic Product (PROBIOTIC PO) Take 1 capsule by mouth daily.   07/11/2018 at Unknown time  . Simethicone (GAS-X EXTRA STRENGTH) 125 MG CAPS Take 1 capsule by mouth every morning.   07/11/2018 at Unknown time     Allergies  Allergen Reactions  . Cleocin [Clindamycin Hcl] Swelling, Rash and Other (See Comments)    Tongue, lip swelling Tongue, lip swelling Tongue, lip swelling  . Pen Vk [Penicillin V] Swelling, Rash and Other (See Comments)    Tongue, lip swelling Funny feeling on the tongue  Has patient had a PCN reaction causing immediate rash, facial/tongue/throat swelling, SOB or lightheadedness  with hypotension:Yes Has patient had a PCN reaction causing severe rash involving mucus membranes or skin necrosis:No Has patient had a PCN reaction that required hospitalization:No Has patient had a PCN reaction occurring within the last 10 years:No If all of the above answers are "NO", then may proceed with Cephalosporin use.      Past Medical History:  Diagnosis Date  . Anemia   . Arthritis   . Body mass index (bmi) 37.0-37.9, adult   . Breast cancer ([place]) 01/02/2016   right breast/radiation   . Cancer ([place]) 2017   BREAST  . Chronic insomnia   . Complication of anesthesia    has difficulty last surgery intubating  . Depression   . Difficult intubation    WITH TKR  . Dizziness   . Dysrhythmia    PVC'S,BRADYCARDIA  . GERD (gastroesophageal reflux disease)   . History of chicken pox   . Hypertension   . Hyponatremia   . Intermittent vertigo   . Irregular heart beat   . Mild mitral regurgitation   . 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[place])   . MVP (mitral valve prolapse)   . Orthopnea   . Personal history of radiation therapy 2017  . Pre-diabetes   . PVC's (premature ventricular contractions)   . Reflux   . Sleep apnea    CPAP    Review of systems:  Otherwise negative.    Physical Exam  Gen: Alert, oriented. Appears stated age.  HEENT: [place]/AT. PERRLA. Lungs: CTA, no wheezes. CV: RR nl S1, S2. Abd: soft, benign, no masses. BS+ Ext: No edema. Pulses 2+    Planned procedures: Proceed with colonoscopy. The patient understands the nature of the planned procedure, indications, risks, alternatives and potential complications including but not limited to bleeding, infection, perforation, damage to internal organs and possible oversedation/side effects from anesthesia. The patient agrees and gives consent to proceed.  Please refer to procedure notes for findings, recommendations and patient disposition/instructions.     Courtney Lynn, M.D. Gastroenterology 07/12/2018  12:09 PM

## 2018-07-13 ENCOUNTER — Encounter: Payer: Self-pay | Admitting: Internal Medicine

## 2018-07-14 NOTE — Anesthesia Postprocedure Evaluation (Signed)
Anesthesia Post Note  Patient: Courtney Lynn  Procedure(s) Performed: COLONOSCOPY WITH PROPOFOL (N/A )  Patient location during evaluation: Endoscopy Anesthesia Type: General Level of consciousness: awake and alert Pain management: pain level controlled Vital Signs Assessment: post-procedure vital signs reviewed and stable Respiratory status: spontaneous breathing, nonlabored ventilation, respiratory function stable and patient connected to nasal cannula oxygen Cardiovascular status: blood pressure returned to baseline and stable Postop Assessment: no apparent nausea or vomiting Anesthetic complications: no     Last Vitals:  Vitals:   07/12/18 1243 07/12/18 1253  BP:  125/64  Pulse:    Resp:    Temp: (!) 36.1 C   SpO2:      Last Pain:  Vitals:   07/13/18 0801  TempSrc:   PainSc: 0-No pain                 Magan Winnett S

## 2018-07-17 DIAGNOSIS — M62830 Muscle spasm of back: Secondary | ICD-10-CM | POA: Diagnosis not present

## 2018-07-17 DIAGNOSIS — M5416 Radiculopathy, lumbar region: Secondary | ICD-10-CM | POA: Diagnosis not present

## 2018-07-17 DIAGNOSIS — M47816 Spondylosis without myelopathy or radiculopathy, lumbar region: Secondary | ICD-10-CM | POA: Diagnosis not present

## 2018-07-17 DIAGNOSIS — M4726 Other spondylosis with radiculopathy, lumbar region: Secondary | ICD-10-CM | POA: Diagnosis not present

## 2018-07-17 DIAGNOSIS — M5136 Other intervertebral disc degeneration, lumbar region: Secondary | ICD-10-CM | POA: Diagnosis not present

## 2018-07-17 DIAGNOSIS — M48062 Spinal stenosis, lumbar region with neurogenic claudication: Secondary | ICD-10-CM | POA: Diagnosis not present

## 2018-07-26 DIAGNOSIS — H02833 Dermatochalasis of right eye, unspecified eyelid: Secondary | ICD-10-CM | POA: Diagnosis not present

## 2018-09-04 DIAGNOSIS — G4733 Obstructive sleep apnea (adult) (pediatric): Secondary | ICD-10-CM | POA: Diagnosis not present

## 2018-09-07 DIAGNOSIS — I10 Essential (primary) hypertension: Secondary | ICD-10-CM | POA: Diagnosis not present

## 2018-09-07 DIAGNOSIS — G4733 Obstructive sleep apnea (adult) (pediatric): Secondary | ICD-10-CM | POA: Diagnosis not present

## 2018-09-07 DIAGNOSIS — F3341 Major depressive disorder, recurrent, in partial remission: Secondary | ICD-10-CM | POA: Diagnosis not present

## 2018-09-07 DIAGNOSIS — R413 Other amnesia: Secondary | ICD-10-CM | POA: Diagnosis not present

## 2018-09-07 DIAGNOSIS — Z9989 Dependence on other enabling machines and devices: Secondary | ICD-10-CM | POA: Diagnosis not present

## 2018-10-02 ENCOUNTER — Other Ambulatory Visit: Payer: Self-pay | Admitting: Internal Medicine

## 2018-10-02 DIAGNOSIS — Z1231 Encounter for screening mammogram for malignant neoplasm of breast: Secondary | ICD-10-CM

## 2018-10-03 DIAGNOSIS — G4733 Obstructive sleep apnea (adult) (pediatric): Secondary | ICD-10-CM | POA: Diagnosis not present

## 2018-10-05 ENCOUNTER — Ambulatory Visit (INDEPENDENT_AMBULATORY_CARE_PROVIDER_SITE_OTHER): Payer: PPO | Admitting: Podiatry

## 2018-10-05 ENCOUNTER — Encounter: Payer: Self-pay | Admitting: Podiatry

## 2018-10-05 ENCOUNTER — Other Ambulatory Visit: Payer: Self-pay

## 2018-10-05 DIAGNOSIS — M2040 Other hammer toe(s) (acquired), unspecified foot: Secondary | ICD-10-CM | POA: Diagnosis not present

## 2018-10-05 DIAGNOSIS — M79674 Pain in right toe(s): Secondary | ICD-10-CM

## 2018-10-05 DIAGNOSIS — B351 Tinea unguium: Secondary | ICD-10-CM

## 2018-10-05 DIAGNOSIS — M79675 Pain in left toe(s): Secondary | ICD-10-CM

## 2018-10-05 DIAGNOSIS — M2010 Hallux valgus (acquired), unspecified foot: Secondary | ICD-10-CM

## 2018-10-05 NOTE — Progress Notes (Signed)
Complaint:  Visit Type: Patient returns to my office for continued preventative foot care services. Complaint: Patient states" my nails have grown long and thick and become painful to walk and wear shoes" .      She presents for continued foot care services.   Podiatric Exam: Vascular: dorsalis pedis and posterior tibial pulses are palpable bilateral. Capillary return is immediate. Temperature gradient is WNL. Skin turgor WNL  Sensorium: Normal Semmes Weinstein monofilament test. Normal tactile sensation bilaterally. Nail Exam: Pt has thick disfigured discolored nails with subungual debris noted bilateral entire nail hallux through fifth toenails Ulcer Exam: There is no evidence of ulcer or pre-ulcerative changes or infection. Orthopedic Exam: Muscle tone and strength are WNL. No limitations in general ROM. No crepitus or effusions noted. Foot type and digits show no abnormalities. Bony prominences are unremarkable.Liz-Frank  Arthritis  B/L.  Severe HAV with contracted digits  Right..   Skin: No Porokeratosis. No infection or ulcers  Diagnosis:  Onychomycosis, , Pain in right toe, pain in left toes,  HAV right foot   Treatment & Plan Procedures and Treatment: Consent by patient was obtained for treatment procedures. The patient understood the discussion of treatment and procedures well. All questions were answered thoroughly reviewed. Debridement of mycotic and hypertrophic toenails, 1 through 5 bilateral and clearing of subungual debris. No ulceration, no infection noted.   Return Visit-Office Procedure: Patient instructed to return to the office for a follow up visit 9 weeks  for continued evaluation and treatment.     Gardiner Barefoot DPM

## 2018-10-10 DIAGNOSIS — I10 Essential (primary) hypertension: Secondary | ICD-10-CM | POA: Diagnosis not present

## 2018-10-10 DIAGNOSIS — Z9989 Dependence on other enabling machines and devices: Secondary | ICD-10-CM | POA: Diagnosis not present

## 2018-10-10 DIAGNOSIS — Z6835 Body mass index (BMI) 35.0-35.9, adult: Secondary | ICD-10-CM | POA: Diagnosis not present

## 2018-10-10 DIAGNOSIS — F3341 Major depressive disorder, recurrent, in partial remission: Secondary | ICD-10-CM | POA: Diagnosis not present

## 2018-10-10 DIAGNOSIS — R413 Other amnesia: Secondary | ICD-10-CM | POA: Diagnosis not present

## 2018-10-10 DIAGNOSIS — M5416 Radiculopathy, lumbar region: Secondary | ICD-10-CM | POA: Diagnosis not present

## 2018-10-10 DIAGNOSIS — G4733 Obstructive sleep apnea (adult) (pediatric): Secondary | ICD-10-CM | POA: Diagnosis not present

## 2018-10-16 DIAGNOSIS — R413 Other amnesia: Secondary | ICD-10-CM | POA: Diagnosis not present

## 2018-10-17 DIAGNOSIS — M47816 Spondylosis without myelopathy or radiculopathy, lumbar region: Secondary | ICD-10-CM | POA: Diagnosis not present

## 2018-10-17 DIAGNOSIS — Z0000 Encounter for general adult medical examination without abnormal findings: Secondary | ICD-10-CM | POA: Diagnosis not present

## 2018-10-17 DIAGNOSIS — M5442 Lumbago with sciatica, left side: Secondary | ICD-10-CM | POA: Diagnosis not present

## 2018-10-17 DIAGNOSIS — M48062 Spinal stenosis, lumbar region with neurogenic claudication: Secondary | ICD-10-CM | POA: Diagnosis not present

## 2018-10-17 DIAGNOSIS — R413 Other amnesia: Secondary | ICD-10-CM | POA: Diagnosis not present

## 2018-10-17 DIAGNOSIS — F3340 Major depressive disorder, recurrent, in remission, unspecified: Secondary | ICD-10-CM | POA: Insufficient documentation

## 2018-10-17 DIAGNOSIS — M5136 Other intervertebral disc degeneration, lumbar region: Secondary | ICD-10-CM | POA: Diagnosis not present

## 2018-10-17 DIAGNOSIS — Z9989 Dependence on other enabling machines and devices: Secondary | ICD-10-CM | POA: Diagnosis not present

## 2018-10-17 DIAGNOSIS — M4726 Other spondylosis with radiculopathy, lumbar region: Secondary | ICD-10-CM | POA: Diagnosis not present

## 2018-10-17 DIAGNOSIS — R0789 Other chest pain: Secondary | ICD-10-CM | POA: Diagnosis not present

## 2018-10-17 DIAGNOSIS — I340 Nonrheumatic mitral (valve) insufficiency: Secondary | ICD-10-CM | POA: Diagnosis not present

## 2018-10-17 DIAGNOSIS — G4733 Obstructive sleep apnea (adult) (pediatric): Secondary | ICD-10-CM | POA: Diagnosis not present

## 2018-10-17 DIAGNOSIS — D649 Anemia, unspecified: Secondary | ICD-10-CM | POA: Diagnosis not present

## 2018-10-17 DIAGNOSIS — M5441 Lumbago with sciatica, right side: Secondary | ICD-10-CM | POA: Diagnosis not present

## 2018-10-17 DIAGNOSIS — M5416 Radiculopathy, lumbar region: Secondary | ICD-10-CM | POA: Diagnosis not present

## 2018-10-17 DIAGNOSIS — I10 Essential (primary) hypertension: Secondary | ICD-10-CM | POA: Diagnosis not present

## 2018-10-17 DIAGNOSIS — M62830 Muscle spasm of back: Secondary | ICD-10-CM | POA: Diagnosis not present

## 2018-10-17 DIAGNOSIS — K649 Unspecified hemorrhoids: Secondary | ICD-10-CM | POA: Diagnosis not present

## 2018-10-19 DIAGNOSIS — K640 First degree hemorrhoids: Secondary | ICD-10-CM | POA: Diagnosis not present

## 2018-10-23 DIAGNOSIS — L578 Other skin changes due to chronic exposure to nonionizing radiation: Secondary | ICD-10-CM | POA: Diagnosis not present

## 2018-10-23 DIAGNOSIS — Z1283 Encounter for screening for malignant neoplasm of skin: Secondary | ICD-10-CM | POA: Diagnosis not present

## 2018-10-23 DIAGNOSIS — L821 Other seborrheic keratosis: Secondary | ICD-10-CM | POA: Diagnosis not present

## 2018-10-23 DIAGNOSIS — D1800 Hemangioma unspecified site: Secondary | ICD-10-CM | POA: Diagnosis not present

## 2018-10-31 DIAGNOSIS — K648 Other hemorrhoids: Secondary | ICD-10-CM | POA: Diagnosis not present

## 2018-11-02 DIAGNOSIS — G4733 Obstructive sleep apnea (adult) (pediatric): Secondary | ICD-10-CM | POA: Diagnosis not present

## 2018-11-14 ENCOUNTER — Ambulatory Visit
Admission: RE | Admit: 2018-11-14 | Discharge: 2018-11-14 | Disposition: A | Discharge status: Home / Self Care | Payer: PPO | Source: Ambulatory Visit | Attending: Internal Medicine | Admitting: Internal Medicine

## 2018-11-14 ENCOUNTER — Other Ambulatory Visit: Payer: Self-pay | Admitting: Internal Medicine

## 2018-11-14 DIAGNOSIS — Z1231 Encounter for screening mammogram for malignant neoplasm of breast: Secondary | ICD-10-CM

## 2018-11-14 DIAGNOSIS — Z853 Personal history of malignant neoplasm of breast: Secondary | ICD-10-CM | POA: Diagnosis not present

## 2018-11-14 DIAGNOSIS — R928 Other abnormal and inconclusive findings on diagnostic imaging of breast: Secondary | ICD-10-CM | POA: Diagnosis not present

## 2018-11-15 ENCOUNTER — Other Ambulatory Visit: Payer: Self-pay | Admitting: Internal Medicine

## 2018-12-03 DIAGNOSIS — G4733 Obstructive sleep apnea (adult) (pediatric): Secondary | ICD-10-CM | POA: Diagnosis not present

## 2018-12-04 DIAGNOSIS — G4733 Obstructive sleep apnea (adult) (pediatric): Secondary | ICD-10-CM | POA: Diagnosis not present

## 2018-12-07 ENCOUNTER — Other Ambulatory Visit: Payer: Self-pay

## 2018-12-07 ENCOUNTER — Ambulatory Visit: Payer: PPO | Admitting: Podiatry

## 2018-12-07 ENCOUNTER — Encounter: Payer: Self-pay | Admitting: Podiatry

## 2018-12-07 DIAGNOSIS — M79674 Pain in right toe(s): Secondary | ICD-10-CM | POA: Diagnosis not present

## 2018-12-07 DIAGNOSIS — M79675 Pain in left toe(s): Secondary | ICD-10-CM

## 2018-12-07 DIAGNOSIS — B351 Tinea unguium: Secondary | ICD-10-CM | POA: Diagnosis not present

## 2018-12-07 NOTE — Progress Notes (Signed)
Complaint:  Visit Type: Patient returns to my office for continued preventative foot care services. Complaint: Patient states" my nails have grown long and thick and become painful to walk and wear shoes" .      She presents for continued foot care services.   Podiatric Exam: Vascular: dorsalis pedis and posterior tibial pulses are palpable bilateral. Capillary return is immediate. Temperature gradient is WNL. Skin turgor WNL  Sensorium: Normal Semmes Weinstein monofilament test. Normal tactile sensation bilaterally. Nail Exam: Pt has thick disfigured discolored nails with subungual debris noted bilateral entire nail hallux through fifth toenails Ulcer Exam: There is no evidence of ulcer or pre-ulcerative changes or infection. Orthopedic Exam: Muscle tone and strength are WNL. No limitations in general ROM. No crepitus or effusions noted. Foot type and digits show no abnormalities. Bony prominences are unremarkable.Liz-Frank  Arthritis  B/L.  Severe HAV with contracted digits  Right..   Skin: No Porokeratosis. No infection or ulcers  Diagnosis:  Onychomycosis, , Pain in right toe, pain in left toes,  HAV right foot   Treatment & Plan Procedures and Treatment: Consent by patient was obtained for treatment procedures. The patient understood the discussion of treatment and procedures well. All questions were answered thoroughly reviewed. Debridement of mycotic and hypertrophic toenails, 1 through 5 bilateral and clearing of subungual debris. No ulceration, no infection noted.   Return Visit-Office Procedure: Patient instructed to return to the office for a follow up visit 9 weeks  for continued evaluation and treatment.     Gardiner Barefoot DPM

## 2019-01-02 DIAGNOSIS — G4733 Obstructive sleep apnea (adult) (pediatric): Secondary | ICD-10-CM | POA: Diagnosis not present

## 2019-01-16 DIAGNOSIS — M47816 Spondylosis without myelopathy or radiculopathy, lumbar region: Secondary | ICD-10-CM | POA: Diagnosis not present

## 2019-01-16 DIAGNOSIS — M4726 Other spondylosis with radiculopathy, lumbar region: Secondary | ICD-10-CM | POA: Diagnosis not present

## 2019-01-16 DIAGNOSIS — M5136 Other intervertebral disc degeneration, lumbar region: Secondary | ICD-10-CM | POA: Diagnosis not present

## 2019-01-16 DIAGNOSIS — M62830 Muscle spasm of back: Secondary | ICD-10-CM | POA: Diagnosis not present

## 2019-01-16 DIAGNOSIS — M5416 Radiculopathy, lumbar region: Secondary | ICD-10-CM | POA: Diagnosis not present

## 2019-01-16 DIAGNOSIS — M5441 Lumbago with sciatica, right side: Secondary | ICD-10-CM | POA: Diagnosis not present

## 2019-01-16 DIAGNOSIS — R0789 Other chest pain: Secondary | ICD-10-CM | POA: Diagnosis not present

## 2019-01-16 DIAGNOSIS — M5442 Lumbago with sciatica, left side: Secondary | ICD-10-CM | POA: Diagnosis not present

## 2019-01-16 DIAGNOSIS — M48062 Spinal stenosis, lumbar region with neurogenic claudication: Secondary | ICD-10-CM | POA: Diagnosis not present

## 2019-02-02 DIAGNOSIS — G4733 Obstructive sleep apnea (adult) (pediatric): Secondary | ICD-10-CM | POA: Diagnosis not present

## 2019-02-08 ENCOUNTER — Ambulatory Visit: Payer: PPO | Admitting: Podiatry

## 2019-02-08 ENCOUNTER — Encounter: Payer: Self-pay | Admitting: Podiatry

## 2019-02-08 ENCOUNTER — Other Ambulatory Visit: Payer: Self-pay

## 2019-02-08 DIAGNOSIS — M79675 Pain in left toe(s): Secondary | ICD-10-CM | POA: Diagnosis not present

## 2019-02-08 DIAGNOSIS — B351 Tinea unguium: Secondary | ICD-10-CM | POA: Diagnosis not present

## 2019-02-08 DIAGNOSIS — M79674 Pain in right toe(s): Secondary | ICD-10-CM

## 2019-02-08 NOTE — Progress Notes (Signed)
Complaint:  Visit Type: Patient returns to my office for continued preventative foot care services. Complaint: Patient states" my nails have grown long and thick and become painful to walk and wear shoes" .      She presents for continued foot care services.   Podiatric Exam: Vascular: dorsalis pedis and posterior tibial pulses are palpable bilateral. Capillary return is immediate. Temperature gradient is WNL. Skin turgor WNL  Sensorium: Normal Semmes Weinstein monofilament test. Normal tactile sensation bilaterally. Nail Exam: Pt has thick disfigured discolored nails with subungual debris noted bilateral entire nail hallux through fifth toenails Ulcer Exam: There is no evidence of ulcer or pre-ulcerative changes or infection. Orthopedic Exam: Muscle tone and strength are WNL. No limitations in general ROM. No crepitus or effusions noted. Foot type and digits show no abnormalities. Bony prominences are unremarkable.Liz-Frank  Arthritis  B/L.  Severe HAV with contracted digits  Right..   Skin: No Porokeratosis. No infection or ulcers  Diagnosis:  Onychomycosis, , Pain in right toe, pain in left toes,  HAV right foot   Treatment & Plan Procedures and Treatment: Consent by patient was obtained for treatment procedures. The patient understood the discussion of treatment and procedures well. All questions were answered thoroughly reviewed. Debridement of mycotic and hypertrophic toenails, 1 through 5 bilateral and clearing of subungual debris. No ulceration, no infection noted.  Iatrogenic lesion 3rd right.  Bandaged. Return Visit-Office Procedure: Patient instructed to return to the office for a follow up visit 9 weeks  for continued evaluation and treatment.     Gardiner Barefoot DPM

## 2019-02-09 DIAGNOSIS — Z9989 Dependence on other enabling machines and devices: Secondary | ICD-10-CM | POA: Diagnosis not present

## 2019-02-09 DIAGNOSIS — G4733 Obstructive sleep apnea (adult) (pediatric): Secondary | ICD-10-CM | POA: Diagnosis not present

## 2019-02-09 DIAGNOSIS — I340 Nonrheumatic mitral (valve) insufficiency: Secondary | ICD-10-CM | POA: Diagnosis not present

## 2019-02-09 DIAGNOSIS — R8290 Unspecified abnormal findings in urine: Secondary | ICD-10-CM | POA: Diagnosis not present

## 2019-02-09 DIAGNOSIS — R413 Other amnesia: Secondary | ICD-10-CM | POA: Diagnosis not present

## 2019-02-09 DIAGNOSIS — F3340 Major depressive disorder, recurrent, in remission, unspecified: Secondary | ICD-10-CM | POA: Diagnosis not present

## 2019-02-09 DIAGNOSIS — D649 Anemia, unspecified: Secondary | ICD-10-CM | POA: Diagnosis not present

## 2019-02-09 DIAGNOSIS — I10 Essential (primary) hypertension: Secondary | ICD-10-CM | POA: Diagnosis not present

## 2019-02-09 DIAGNOSIS — K649 Unspecified hemorrhoids: Secondary | ICD-10-CM | POA: Diagnosis not present

## 2019-02-16 DIAGNOSIS — F331 Major depressive disorder, recurrent, moderate: Secondary | ICD-10-CM | POA: Diagnosis not present

## 2019-02-16 DIAGNOSIS — I10 Essential (primary) hypertension: Secondary | ICD-10-CM | POA: Diagnosis not present

## 2019-02-16 DIAGNOSIS — Z9989 Dependence on other enabling machines and devices: Secondary | ICD-10-CM | POA: Diagnosis not present

## 2019-02-16 DIAGNOSIS — E871 Hypo-osmolality and hyponatremia: Secondary | ICD-10-CM | POA: Diagnosis not present

## 2019-02-16 DIAGNOSIS — D649 Anemia, unspecified: Secondary | ICD-10-CM | POA: Diagnosis not present

## 2019-02-16 DIAGNOSIS — G4733 Obstructive sleep apnea (adult) (pediatric): Secondary | ICD-10-CM | POA: Diagnosis not present

## 2019-02-16 DIAGNOSIS — I341 Nonrheumatic mitral (valve) prolapse: Secondary | ICD-10-CM | POA: Diagnosis not present

## 2019-02-16 DIAGNOSIS — B9620 Unspecified Escherichia coli [E. coli] as the cause of diseases classified elsewhere: Secondary | ICD-10-CM | POA: Diagnosis not present

## 2019-02-16 DIAGNOSIS — Z6831 Body mass index (BMI) 31.0-31.9, adult: Secondary | ICD-10-CM | POA: Diagnosis not present

## 2019-02-16 DIAGNOSIS — N390 Urinary tract infection, site not specified: Secondary | ICD-10-CM | POA: Diagnosis not present

## 2019-02-16 DIAGNOSIS — Z0000 Encounter for general adult medical examination without abnormal findings: Secondary | ICD-10-CM | POA: Diagnosis not present

## 2019-03-01 DIAGNOSIS — Z9989 Dependence on other enabling machines and devices: Secondary | ICD-10-CM | POA: Diagnosis not present

## 2019-03-01 DIAGNOSIS — G4733 Obstructive sleep apnea (adult) (pediatric): Secondary | ICD-10-CM | POA: Diagnosis not present

## 2019-03-01 DIAGNOSIS — I10 Essential (primary) hypertension: Secondary | ICD-10-CM | POA: Diagnosis not present

## 2019-03-01 DIAGNOSIS — I341 Nonrheumatic mitral (valve) prolapse: Secondary | ICD-10-CM | POA: Diagnosis not present

## 2019-03-01 DIAGNOSIS — I493 Ventricular premature depolarization: Secondary | ICD-10-CM | POA: Diagnosis not present

## 2019-03-01 DIAGNOSIS — R001 Bradycardia, unspecified: Secondary | ICD-10-CM | POA: Diagnosis not present

## 2019-03-05 DIAGNOSIS — G4733 Obstructive sleep apnea (adult) (pediatric): Secondary | ICD-10-CM | POA: Diagnosis not present

## 2019-03-14 DIAGNOSIS — M79602 Pain in left arm: Secondary | ICD-10-CM | POA: Diagnosis not present

## 2019-03-14 DIAGNOSIS — M62830 Muscle spasm of back: Secondary | ICD-10-CM | POA: Diagnosis not present

## 2019-03-14 DIAGNOSIS — R0789 Other chest pain: Secondary | ICD-10-CM | POA: Diagnosis not present

## 2019-03-14 DIAGNOSIS — M4726 Other spondylosis with radiculopathy, lumbar region: Secondary | ICD-10-CM | POA: Diagnosis not present

## 2019-03-14 DIAGNOSIS — M5136 Other intervertebral disc degeneration, lumbar region: Secondary | ICD-10-CM | POA: Diagnosis not present

## 2019-03-14 DIAGNOSIS — K641 Second degree hemorrhoids: Secondary | ICD-10-CM | POA: Diagnosis not present

## 2019-03-29 DIAGNOSIS — G4733 Obstructive sleep apnea (adult) (pediatric): Secondary | ICD-10-CM | POA: Diagnosis not present

## 2019-04-02 DIAGNOSIS — G4733 Obstructive sleep apnea (adult) (pediatric): Secondary | ICD-10-CM | POA: Diagnosis not present

## 2019-04-09 DIAGNOSIS — M5442 Lumbago with sciatica, left side: Secondary | ICD-10-CM | POA: Diagnosis not present

## 2019-04-09 DIAGNOSIS — M5416 Radiculopathy, lumbar region: Secondary | ICD-10-CM | POA: Diagnosis not present

## 2019-04-09 DIAGNOSIS — M48062 Spinal stenosis, lumbar region with neurogenic claudication: Secondary | ICD-10-CM | POA: Diagnosis not present

## 2019-04-09 DIAGNOSIS — M4726 Other spondylosis with radiculopathy, lumbar region: Secondary | ICD-10-CM | POA: Diagnosis not present

## 2019-04-09 DIAGNOSIS — M5136 Other intervertebral disc degeneration, lumbar region: Secondary | ICD-10-CM | POA: Diagnosis not present

## 2019-04-09 DIAGNOSIS — M47816 Spondylosis without myelopathy or radiculopathy, lumbar region: Secondary | ICD-10-CM | POA: Diagnosis not present

## 2019-04-09 DIAGNOSIS — M62830 Muscle spasm of back: Secondary | ICD-10-CM | POA: Diagnosis not present

## 2019-04-09 DIAGNOSIS — M5441 Lumbago with sciatica, right side: Secondary | ICD-10-CM | POA: Diagnosis not present

## 2019-04-10 ENCOUNTER — Telehealth: Payer: Self-pay | Admitting: Podiatry

## 2019-04-10 NOTE — Telephone Encounter (Signed)
Tried calling pt back to let her know she can talk to Dr. Prudence Davidson at her appointment on Thursday in regards to seeing if she can have sx. Unable to leave message as voicemail is full.

## 2019-04-10 NOTE — Telephone Encounter (Signed)
I have a little toe that's bent and it just hurts a lot when I walk on it. I wondered if I could have sx on it. My number is [phone]. Please let me know.

## 2019-04-12 ENCOUNTER — Other Ambulatory Visit: Payer: Self-pay

## 2019-04-12 ENCOUNTER — Ambulatory Visit: Payer: PPO | Admitting: Podiatry

## 2019-04-12 ENCOUNTER — Encounter: Payer: Self-pay | Admitting: Podiatry

## 2019-04-12 VITALS — HR 98

## 2019-04-12 DIAGNOSIS — M79674 Pain in right toe(s): Secondary | ICD-10-CM

## 2019-04-12 DIAGNOSIS — B351 Tinea unguium: Secondary | ICD-10-CM

## 2019-04-12 DIAGNOSIS — M79675 Pain in left toe(s): Secondary | ICD-10-CM

## 2019-04-12 DIAGNOSIS — M2010 Hallux valgus (acquired), unspecified foot: Secondary | ICD-10-CM | POA: Diagnosis not present

## 2019-04-12 DIAGNOSIS — M2040 Other hammer toe(s) (acquired), unspecified foot: Secondary | ICD-10-CM | POA: Diagnosis not present

## 2019-04-12 NOTE — Progress Notes (Signed)
This patient returns to the office for evaluation and treatment of long thick painful nails .  This patient is unable to trim his own nails since the patient cannot reach the feet.  Patient says the nails are painful walking and wearing his shoes.  He returns for preventive foot care services.  General Appearance  Alert, conversant and in no acute stress.  Vascular  Dorsalis pedis and posterior tibial  pulses are palpable  bilaterally.  Capillary return is within normal limits  bilaterally. Temperature is within normal limits  bilaterally.  Neurologic  Senn-Weinstein monofilament wire test within normal limits  bilaterally. Muscle power within normal limits bilaterally.  Nails Thick disfigured discolored nails with subungual debris  from hallux to fifth toes bilaterally. No evidence of bacterial infection or drainage bilaterally.  Orthopedic  No limitations of motion  feet .  No crepitus or effusions noted.  No bony pathology or digital deformities noted. Bony prominences at Liz-Frank  B/L.  HAV with contracted digits right foot.  Skin  normotropic skin with no porokeratosis noted bilaterally.  No signs of infections or ulcers noted.     Onychomycosis  Pain in toes right foot  Pain in toes left foot  Debridement  of nails  1-5  B/L with a nail nipper.  Nails were then filed using a dremel tool with no incidents.   Patient asked about possible surgery on the toes right foot.   RTC  9 weeks    Gardiner Barefoot DPM

## 2019-04-13 ENCOUNTER — Ambulatory Visit: Payer: Self-pay | Admitting: Surgery

## 2019-04-13 ENCOUNTER — Encounter
Admission: RE | Admit: 2019-04-13 | Discharge: 2019-04-13 | Disposition: A | Discharge status: Home / Self Care | Payer: PPO | Source: Ambulatory Visit | Attending: Surgery | Admitting: Surgery

## 2019-04-13 HISTORY — DX: Other amnesia: R41.3

## 2019-04-13 NOTE — Patient Instructions (Signed)
Your procedure is scheduled on: 04/19/19 Report to [place]. To find out your arrival time please call [phone] between 1PM - 3PM on 04/18/19.  Remember: Instructions that are not followed completely may result in serious medical risk, up to and including death, or upon the discretion of your surgeon and anesthesiologist your surgery may need to be rescheduled.     _X__ 1. Do not eat food after midnight the night before your procedure.                 No gum chewing or hard candies. You may drink clear liquids up to 2 hours                 before you are scheduled to arrive for your surgery- DO not drink clear                 liquids within 2 hours of the start of your surgery.                 Clear Liquids include:  water, apple juice without pulp, clear carbohydrate                 drink such as Clearfast or Gatorade, Black Coffee or Tea (Do not add                 anything to coffee or tea). Diabetics water only  __X__2.  On the morning of surgery brush your teeth with toothpaste and water, you                 may rinse your mouth with mouthwash if you wish.  Do not swallow any              toothpaste of mouthwash.     _X__ 3.  No Alcohol for 24 hours before or after surgery.   _X__ 4.  Do Not Smoke or use e-cigarettes For 24 Hours Prior to Your Surgery.                 Do not use any chewable tobacco products for at least 6 hours prior to                 surgery.  ____  5.  Bring all medications with you on the day of surgery if instructed.   __X__  6.  Notify your doctor if there is any change in your medical condition      (cold, fever, infections).     Do not wear jewelry, make-up, hairpins, clips or nail polish. Do not wear lotions, powders, or perfumes.  Do not shave 48 hours prior to surgery. Men may shave face and neck. Do not bring valuables to the hospital.    [facility] is not responsible for any belongings or  valuables.  Contacts, dentures/partials or body piercings may not be worn into surgery. Bring a case for your contacts, glasses or hearing aids, a denture cup will be supplied. Leave your suitcase in the car. After surgery it may be brought to your room. For patients admitted to the hospital, discharge time is determined by your treatment team.   Patients discharged the day of surgery will not be allowed to drive home.   Please read over the following fact sheets that you were given:   MRSA Information  __X__ Take these medicines the morning of surgery with A SIP OF WATER:  1. felodipine (PLENDIL) 10 MG 24 hr tablet  2. hydrALAZINE (APRESOLINE) 50 MG tablet  3. metoprolol succinate (TOPROL-XL) 25 MG 24 hr tablet  4. omeprazole (PRILOSEC) 20 MG capsule  5.  6.  ____ Fleet Enema (as directed)   ____ Use CHG Soap/SAGE wipes as directed  ____ Use inhalers on the day of surgery  ____ Stop metformin/Janumet/Farxiga 2 days prior to surgery    ____ Take 1/2 of usual insulin dose the night before surgery. No insulin the morning          of surgery.   ____ Stop Blood Thinners Coumadin/Plavix/Xarelto/Pleta/Pradaxa/Eliquis/Effient/Aspirin  on   Or contact your Surgeon, Cardiologist or Medical Doctor regarding  ability to stop your blood thinners  __X__ Stop Anti-inflammatories 7 days before surgery such as Advil, Ibuprofen, Motrin,  BC or Goodies Powder, Naprosyn, Naproxen, Aleve, Aspirin    __X__ Stop all herbal supplements, fish oil or vitamin E until after surgery.    ____ Bring C-Pap to the hospital.

## 2019-04-13 NOTE — H&P (View-Only) (Signed)
Subjective:   CC: Grade II hemorrhoids [K64.1]   HPI:  Courtney Lynn is a 79 y.o. female who is here for followup for above.  Had another episode of bleeding over the weekend. Resolved now, but wanted to be reassessed.  Painless bleeding, described as continuing in between her bowel movements.      Current Medications: has a current medication list which includes the following prescription(s): wheat dextrin, menthol, calcium carbonate, calcium/magnesium, citalopram, docusate, donepezil, felodipine, hydralazine, hydrocortisone, iron polysaccharides, lactase, medical supply, miscellaneous, meloxicam, methocarbamol, metoprolol succinate, multivitamin, olmesartan, omega-3/dha/epa/dpa/fish oil, omeprazole, polyethylene glycol, prednisone, tramadol, and ubidecarenone.  Allergies:       Allergies  Allergen Reactions  . Cleocin [Clindamycin Hcl] Other (See Comments)    Tongue, lip swelling  . Penicillin V Other (See Comments)    Tongue, lip swelling    ROS: General: Denies weight loss, weight gain, fatigue, fevers, chills, and night sweats. Heart: Denies chest pain, palpitations, racing heart, irregular heartbeat, leg pain or swelling, and decreased activity tolerance. Respiratory: Denies breathing difficulty, shortness of breath, wheezing, cough, and sputum. GI: Denies change in appetite, heartburn, nausea, vomiting, constipation, diarrhea GU: Denies difficulty urinating, pain with urinating, urgency, frequency, blood in urine    Objective:   BP 157/66   Pulse 58   Ht 160 cm (5\' 3" )   Wt 82.6 kg (182 lb)   LMP  (LMP Unknown)   BMI 32.24 kg/m   Constitutional :  alert, appears stated age, cooperative and no distress  Gastrointestinal: soft, non-tender; bowel sounds normal; no masses,  no organomegaly.    Musculoskeletal: Steady gait and movement  Skin: Cool and moist, incision clean, dry, intact.  No erythema, induration or drainage to indicate infection.    Psychiatric:  Normal affect, non-agitated, not confused  Rectal: Chaperone present for exam.  External exam noted to have no external hemorrhoids. After obtaining verbal consent, an anoscope was inserted after prepped with lubricant and right posterior and anterior hemorrhoids noted with tender to palpation, evidence of recent thrombosis and ulceration that is now healing. No other pathology such as fissures, fistulas, polyps noted.  Scope withdrawn and patient tolerate procedure well.     LABS:  N/A   RADS: N/A  Assessment:      Grade II hemorrhoids [K64.1]  Plan:   1. Stigmata of bleeding in the past, but no active bleeding today.  These are tender to palpation compared to previous hemorrhoids, so will not be amenable to banding procedure.   Discussed risks/benefits/alternatives to surgery.  Alternatives include the options of observation, medical management.  Benefits include symptomatic relief.  I discussed  in detail and the complications related to the operation and the anesthesia, including bleeding, infection, recurrence, remote possibility of temporary or permanent fecal incontinence, poor/delayed wound healing, chronic pain, and additional procedures to address said risks. The risks of general anesthetic, if used, includes MI, CVA, sudden death or even reaction to anesthetic medications also discussed.   We also discussed typical post operative recovery which includes weeks to potentially months of anal pain, drainage, occasional bleeding, and sense of fecal urgency.    ED return precautions given for sudden increase in pain, bleeding, with possible accompanying fever, nausea, and/or vomiting.  The patient understands the risks, any and all questions were answered to the patient's satisfaction.

## 2019-04-13 NOTE — Pre-Procedure Instructions (Signed)
During Pre op interview it was discovered Courtney Lynn had intentions to drive herself to and from her surgery with Dr Lysle Pearl on 3/18 and did not have plans to have anyone stay or check in on her once she got home.She lives in an apartment at [facility]. She denies having any friends or family. I encouraged her to contact [facility] and make arrangements with them to assist her with this. She repeated over and over she did not need this she should be able to drive she should not need anyone checking on her b/c she's had surgery before and this was not required. Again she was urged to contact [facility] and make arrangements. Sherry at Dr Ines Bloomer office was also notified of [name]'s intentions.

## 2019-04-13 NOTE — H&P (Signed)
Subjective:   CC: Grade II hemorrhoids [K64.1]   HPI:  Courtney Lynn is a 79 y.o. female who is here for followup for above.  Had another episode of bleeding over the weekend. Resolved now, but wanted to be reassessed.  Painless bleeding, described as continuing in between her bowel movements.      Current Medications: has a current medication list which includes the following prescription(s): wheat dextrin, menthol, calcium carbonate, calcium/magnesium, citalopram, docusate, donepezil, felodipine, hydralazine, hydrocortisone, iron polysaccharides, lactase, medical supply, miscellaneous, meloxicam, methocarbamol, metoprolol succinate, multivitamin, olmesartan, omega-3/dha/epa/dpa/fish oil, omeprazole, polyethylene glycol, prednisone, tramadol, and ubidecarenone.  Allergies:       Allergies  Allergen Reactions  . Cleocin [Clindamycin Hcl] Other (See Comments)    Tongue, lip swelling  . Penicillin V Other (See Comments)    Tongue, lip swelling    ROS: General: Denies weight loss, weight gain, fatigue, fevers, chills, and night sweats. Heart: Denies chest pain, palpitations, racing heart, irregular heartbeat, leg pain or swelling, and decreased activity tolerance. Respiratory: Denies breathing difficulty, shortness of breath, wheezing, cough, and sputum. GI: Denies change in appetite, heartburn, nausea, vomiting, constipation, diarrhea GU: Denies difficulty urinating, pain with urinating, urgency, frequency, blood in urine    Objective:   BP 157/66   Pulse 58   Ht 160 cm (5\' 3" )   Wt 82.6 kg (182 lb)   LMP  (LMP Unknown)   BMI 32.24 kg/m   Constitutional :  alert, appears stated age, cooperative and no distress  Gastrointestinal: soft, non-tender; bowel sounds normal; no masses,  no organomegaly.    Musculoskeletal: Steady gait and movement  Skin: Cool and moist, incision clean, dry, intact.  No erythema, induration or drainage to indicate infection.    Psychiatric:  Normal affect, non-agitated, not confused  Rectal: Chaperone present for exam.  External exam noted to have no external hemorrhoids. After obtaining verbal consent, an anoscope was inserted after prepped with lubricant and right posterior and anterior hemorrhoids noted with tender to palpation, evidence of recent thrombosis and ulceration that is now healing. No other pathology such as fissures, fistulas, polyps noted.  Scope withdrawn and patient tolerate procedure well.     LABS:  N/A   RADS: N/A  Assessment:      Grade II hemorrhoids [K64.1]  Plan:   1. Stigmata of bleeding in the past, but no active bleeding today.  These are tender to palpation compared to previous hemorrhoids, so will not be amenable to banding procedure.   Discussed risks/benefits/alternatives to surgery.  Alternatives include the options of observation, medical management.  Benefits include symptomatic relief.  I discussed  in detail and the complications related to the operation and the anesthesia, including bleeding, infection, recurrence, remote possibility of temporary or permanent fecal incontinence, poor/delayed wound healing, chronic pain, and additional procedures to address said risks. The risks of general anesthetic, if used, includes MI, CVA, sudden death or even reaction to anesthetic medications also discussed.   We also discussed typical post operative recovery which includes weeks to potentially months of anal pain, drainage, occasional bleeding, and sense of fecal urgency.    ED return precautions given for sudden increase in pain, bleeding, with possible accompanying fever, nausea, and/or vomiting.  The patient understands the risks, any and all questions were answered to the patient's satisfaction.

## 2019-04-16 DIAGNOSIS — R413 Other amnesia: Secondary | ICD-10-CM | POA: Diagnosis not present

## 2019-04-17 ENCOUNTER — Ambulatory Visit (INDEPENDENT_AMBULATORY_CARE_PROVIDER_SITE_OTHER): Payer: PPO

## 2019-04-17 ENCOUNTER — Other Ambulatory Visit: Payer: Self-pay

## 2019-04-17 ENCOUNTER — Ambulatory Visit (INDEPENDENT_AMBULATORY_CARE_PROVIDER_SITE_OTHER): Payer: PPO | Admitting: Podiatry

## 2019-04-17 ENCOUNTER — Encounter
Admission: RE | Admit: 2019-04-17 | Discharge: 2019-04-17 | Disposition: A | Discharge status: Home / Self Care | Payer: PPO | Source: Ambulatory Visit | Attending: Surgery | Admitting: Surgery

## 2019-04-17 ENCOUNTER — Ambulatory Visit: Payer: PPO

## 2019-04-17 DIAGNOSIS — M2041 Other hammer toe(s) (acquired), right foot: Secondary | ICD-10-CM | POA: Diagnosis not present

## 2019-04-17 DIAGNOSIS — Z20822 Contact with and (suspected) exposure to covid-19: Secondary | ICD-10-CM | POA: Diagnosis not present

## 2019-04-17 DIAGNOSIS — M2011 Hallux valgus (acquired), right foot: Secondary | ICD-10-CM | POA: Diagnosis not present

## 2019-04-17 DIAGNOSIS — M2010 Hallux valgus (acquired), unspecified foot: Secondary | ICD-10-CM

## 2019-04-17 DIAGNOSIS — I10 Essential (primary) hypertension: Secondary | ICD-10-CM | POA: Diagnosis not present

## 2019-04-17 DIAGNOSIS — Z01818 Encounter for other preprocedural examination: Secondary | ICD-10-CM | POA: Insufficient documentation

## 2019-04-17 DIAGNOSIS — M205X1 Other deformities of toe(s) (acquired), right foot: Secondary | ICD-10-CM

## 2019-04-17 LAB — SARS CORONAVIRUS 2 (TAT 6-24 HRS): SARS Coronavirus 2: NEGATIVE

## 2019-04-18 ENCOUNTER — Encounter: Payer: Self-pay | Admitting: Podiatry

## 2019-04-18 NOTE — Progress Notes (Signed)
Subjective:  Patient ID: Courtney Lynn, female    DOB: 1941-01-23,  MRN: [id number]  Chief Complaint  Patient presents with  . Hammer Toe    pt is here for a possible hammer toe of the right 2nd and 4th toe, pt states it is painful when walking, pt has tried using padding and support, but states it still hurts when walking.    79 y.o. female presents with the above complaint.  Patient presents with a possible hammertoe contractures of second and fourth digit.  Patient states his pain on palpation.  Patient is well-known to Dr. Prudence Davidson who had been treating her conservatively but has not been able to reduce her pain.  She also has a hammertoe contracture of 2 through 5 with a bunion deformity as well.  However the bunion and the digits that are not listed above are not very painful.  Patient has tried padding it protecting it changing in shoe gear modification orthotics but has not helped.  She would like to know if there is anything surgically that could be done.  She has a flexor contracture of 2 and a mild flexion contracture of 4.  The pain on her fourth is relieved with a toe protector however the pain on the second has not relieved at all with a toe protector due to the more aggressive contracture of the digit.  Patient does not have history of rheumatoid arthritis.   Review of Systems: Negative except as noted in the HPI. Denies N/V/F/Ch.  Past Medical History:  Diagnosis Date  . Anemia   . Arthritis   . Body mass index (bmi) 37.0-37.9, adult   . Breast cancer ([place]) 01/02/2016   right breast/radiation  . Cancer ([place]) 2017   BREAST  . Chronic insomnia   . Complication of anesthesia    has difficulty last surgery intubating  . Depression   . Difficult intubation    WITH TKR  . Dizziness   . Dysrhythmia    PVC'S,BRADYCARDIA  . GERD (gastroesophageal reflux disease)   . History of chicken pox   . Hypertension   . Hyponatremia   . Intermittent vertigo   . Irregular heart beat     . Memory difficulties   . Mild mitral regurgitation   . 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[place])   . MVP (mitral valve prolapse)   . Orthopnea   . Personal history of radiation therapy 2017  . Pre-diabetes   . PVC's (premature ventricular contractions)   . Reflux   . Sleep apnea    CPAP    Current Outpatient Medications:  .  acetaminophen (TYLENOL) 500 MG tablet, Take 1,000 mg by mouth at bedtime., Disp: , Rfl:  .  benzonatate (TESSALON) 100 MG capsule, Take by mouth., Disp: , Rfl:  .  citalopram (CELEXA) 10 MG tablet, Take 10 mg by mouth every other day. In the evening, Disp: , Rfl:  .  docusate sodium (COLACE) 100 MG capsule, Take 100 mg by mouth daily. , Disp: , Rfl:  .  donepezil (ARICEPT) 10 MG tablet, Take 10 mg by mouth at bedtime., Disp: , Rfl:  .  felodipine (PLENDIL) 5 MG 24 hr tablet, Take 5 mg by mouth in the morning and at bedtime. , Disp: , Rfl:  .  hydrALAZINE (APRESOLINE) 50 MG tablet, Take 50 mg by mouth 2 (two) times daily., Disp: , Rfl:  .  iron polysaccharides (NIFEREX) 150 MG capsule, Take by mouth daily. , Disp: , Rfl:  .  Lactase (LACTAID PO), Take by mouth., Disp: , Rfl:  .  meloxicam (MOBIC) 15 MG tablet, TAKE 1 TABLET(15 MG) BY MOUTH EVERY DAY, Disp: , Rfl:  .  methocarbamol (ROBAXIN) 500 MG tablet, TAKE 1/2 TO 1 TABLET BY MOUTH EVERY NIGHT AT BEDTIME AS NEEDED, Disp: , Rfl:  .  metoprolol succinate (TOPROL-XL) 25 MG 24 hr tablet, Take 25 mg by mouth daily., Disp: , Rfl:  .  Multiple Vitamin (MULTIVITAMIN) tablet, Take 1 tablet by mouth daily. Osteo matrix 1 a day, omega 10 1 a day, Disp: , Rfl:  .  Multiple Vitamins-Minerals (MEMORY VITE PO), Take 1 capsule by mouth 2 (two) times daily., Disp: , Rfl:  .  NON FORMULARY, Take 1 capsule by mouth at bedtime. OmegaGuard (active ingredients) Fish oil 1200 mg-Omega 3 667 mg-EPA 363 MG-DHA 240 MG, Disp: , Rfl:  .  NONFORMULARY OR COMPOUNDED ITEM, Take 1-2 capsules by mouth 3 (three) times daily. LunaRich X (Lunasin-soy  peptide) 2 capsule in the morning, 2 capsules mid-day, and 1 capsule at bedtime., Disp: , Rfl:  .  Nutritional Supplements (NUTRITIONAL SUPPLEMENT PO), Take 1 capsule by mouth daily. Protandim Nrf2 Synergizer Milk thistle (Silybum marianum) extract (225 mg) Bacopa (Bacopa monnieri) extract (150 mg) Ashwagandha (Withania somnifera) root (150 mg) Green tea (Camellia sinensis) extract (75 mg) Turmeric (Curcuma longa) extract (75 mg), Disp: , Rfl:  .  olmesartan (BENICAR) 40 MG tablet, Take 40 mg by mouth daily. , Disp: , Rfl:  .  omeprazole (PRILOSEC) 20 MG capsule, TAKE 1 CAPSULE BY MOUTH EVERY DAY, Disp: , Rfl:  .  OVER THE COUNTER MEDICATION, Take 1 capsule by mouth at bedtime. Joint Health Complex  (active ingredients) 60 mg Vitamin C-1.5 mg Zinc-0.2 mg Copper-0.2 mg Manganese-1500 mg Glucosamine-100 mg Boswellia , Disp: , Rfl:  .  Probiotic Product (PROBIOTIC PO), Take 1 capsule by mouth daily., Disp: , Rfl:  .  traMADol (ULTRAM) 50 MG tablet, Take 50 mg by mouth daily as needed. , Disp: , Rfl:   Social History   Tobacco Use  Smoking Status Former Smoker  Smokeless Tobacco Never Used  Tobacco Comment   Quit 1969    Allergies  Allergen Reactions  . Cleocin [Clindamycin Hcl] Swelling, Rash and Other (See Comments)    Tongue, lip swelling Tongue, lip swelling Tongue, lip swelling  . Pen Vk [Penicillin V] Swelling, Rash and Other (See Comments)    Tongue, lip swelling Funny feeling on the tongue  Has patient had a PCN reaction causing immediate rash, facial/tongue/throat swelling, SOB or lightheadedness with hypotension:Yes Has patient had a PCN reaction causing severe rash involving mucus membranes or skin necrosis:No Has patient had a PCN reaction that required hospitalization:No Has patient had a PCN reaction occurring within the last 10 years:No If all of the above answers are "NO", then may proceed with Cephalosporin use.    Objective:  There were no vitals filed for this  visit. There is no height or weight on file to calculate BMI. Constitutional Well developed. Well nourished.  Vascular Dorsalis pedis pulses palpable bilaterally. Posterior tibial pulses palpable bilaterally. Capillary refill normal to all digits.  No cyanosis or clubbing noted. Pedal hair growth normal.  Neurologic Normal speech. Oriented to person, place, and time. Epicritic sensation to light touch grossly present bilaterally.  Dermatologic Nails well groomed and normal in appearance. No open wounds. No skin lesions.  Orthopedic:  Right second digit hammertoe contracture primarily flexor semiflexible in nature.  Distal callus formation on  the tip of the second digit with pain.  Right fourth digit mild flexor contracture with pain associated/distal callus formation at the distal tip.  Pain on palpation to both the second and fourth digit.  Hallux abductovalgus bunion deformity nonreducible without intra-articular pain noted as well as hammertoe contractures of 3 and 5 noted.   Radiographs: 3 views of skeletally mature adult right foot: Lateral deviation of the digits on the metatarsal head noted.  There appears to be a previous fracture of the fifth metatarsal midshaft likely Jones fracture in the past.  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  Bunion deformity noted with sesamoid position of 6 out of 7.  Severe increase in intermetatarsal angle as well as hallux abductus angle.  Dorsal dislocation of the second digit noted on the metatarsophalangeal joint.  Hammertoe contractures 2 through 5 noted. Assessment:   1. Acquired hallux valgus, unspecified laterality   2. Toe contracture, right   3. Hammertoe of right foot    Plan:  Patient was evaluated and treated and all questions answered.  Hammertoe second and fourth with pre-ulcerative callus -Flexor tenotomy as below. -Advised to remove the dressing in 24 hours and apply a band-aid and triple abx ointment every day  thereafter. -Patient will come back and see me for flexor tenotomy of the right second digit in 2 weeks.  She would like to think about the procedure for now.  However I explained to her that given the amount of pressure that is being placed on the tip of the second toe I believe she will benefit aggressively from this procedure.  Patient states understanding -I will hold off on doing any kind of tenotomy to the fourth digit for now as it is very mild contracture and the pain is being relieved with the toe protector -I will also hold off on any aggressive forefoot reconstruction secondary to severe deformity of the forefoot without any associated pain.  For now we will only focus on the areas of the foot that is causing her a lot of pain which is a second digit right foot.   Return in about 2 weeks (around 05/01/2019), or flexor tenotomy.

## 2019-04-19 ENCOUNTER — Other Ambulatory Visit: Payer: Self-pay

## 2019-04-19 ENCOUNTER — Ambulatory Visit: Payer: PPO | Admitting: Certified Registered"

## 2019-04-19 ENCOUNTER — Encounter
Admission: RE | Disposition: A | Discharge status: Home / Self Care | Payer: Self-pay | Source: Ambulatory Visit | Attending: Surgery

## 2019-04-19 ENCOUNTER — Ambulatory Visit
Admission: RE | Admit: 2019-04-19 | Discharge: 2019-04-19 | Disposition: A | Discharge status: Home / Self Care | Payer: PPO | Source: Ambulatory Visit | Attending: Surgery | Admitting: Surgery

## 2019-04-19 ENCOUNTER — Encounter: Payer: Self-pay | Admitting: Surgery

## 2019-04-19 DIAGNOSIS — Z79899 Other long term (current) drug therapy: Secondary | ICD-10-CM | POA: Diagnosis not present

## 2019-04-19 DIAGNOSIS — K641 Second degree hemorrhoids: Secondary | ICD-10-CM | POA: Diagnosis not present

## 2019-04-19 DIAGNOSIS — Z791 Long term (current) use of non-steroidal anti-inflammatories (NSAID): Secondary | ICD-10-CM | POA: Diagnosis not present

## 2019-04-19 DIAGNOSIS — I272 Pulmonary hypertension, unspecified: Secondary | ICD-10-CM | POA: Insufficient documentation

## 2019-04-19 DIAGNOSIS — D649 Anemia, unspecified: Secondary | ICD-10-CM | POA: Diagnosis not present

## 2019-04-19 DIAGNOSIS — I10 Essential (primary) hypertension: Secondary | ICD-10-CM | POA: Diagnosis not present

## 2019-04-19 DIAGNOSIS — F329 Major depressive disorder, single episode, unspecified: Secondary | ICD-10-CM | POA: Insufficient documentation

## 2019-04-19 DIAGNOSIS — G4730 Sleep apnea, unspecified: Secondary | ICD-10-CM | POA: Insufficient documentation

## 2019-04-19 DIAGNOSIS — Z853 Personal history of malignant neoplasm of breast: Secondary | ICD-10-CM | POA: Diagnosis not present

## 2019-04-19 DIAGNOSIS — Z923 Personal history of irradiation: Secondary | ICD-10-CM | POA: Insufficient documentation

## 2019-04-19 DIAGNOSIS — K219 Gastro-esophageal reflux disease without esophagitis: Secondary | ICD-10-CM | POA: Diagnosis not present

## 2019-04-19 DIAGNOSIS — G4733 Obstructive sleep apnea (adult) (pediatric): Secondary | ICD-10-CM | POA: Diagnosis not present

## 2019-04-19 DIAGNOSIS — Z87891 Personal history of nicotine dependence: Secondary | ICD-10-CM | POA: Insufficient documentation

## 2019-04-19 HISTORY — PX: HEMORRHOID SURGERY: SHX153

## 2019-04-19 SURGERY — HEMORRHOIDECTOMY
Anesthesia: General | Site: Rectum

## 2019-04-19 MED ORDER — FAMOTIDINE 20 MG PO TABS
ORAL_TABLET | ORAL | Status: AC
  Administered 2019-04-19: 20 mg via ORAL
  Filled 2019-04-19: qty 1

## 2019-04-19 MED ORDER — CHLORHEXIDINE GLUCONATE CLOTH 2 % EX PADS: 6.0000 | MEDICATED_PAD | Freq: Once | CUTANEOUS | Status: DC

## 2019-04-19 MED ORDER — BUPIVACAINE HCL (PF) 0.5 % IJ SOLN
INTRAMUSCULAR | Status: AC
  Filled 2019-04-19: qty 30

## 2019-04-19 MED ORDER — PROPOFOL 10 MG/ML IV BOLUS
INTRAVENOUS | Status: DC | PRN
  Administered 2019-04-19: 20 mg via INTRAVENOUS
  Administered 2019-04-19: 100 mg via INTRAVENOUS

## 2019-04-19 MED ORDER — DEXMEDETOMIDINE HCL 200 MCG/2ML IV SOLN
INTRAVENOUS | Status: DC | PRN
  Administered 2019-04-19: 8 ug via INTRAVENOUS

## 2019-04-19 MED ORDER — DEXAMETHASONE SODIUM PHOSPHATE 10 MG/ML IJ SOLN
INTRAMUSCULAR | Status: DC | PRN
  Administered 2019-04-19: 5 mg via INTRAVENOUS

## 2019-04-19 MED ORDER — ONDANSETRON HCL 4 MG/2ML IJ SOLN
INTRAMUSCULAR | Status: DC | PRN
  Administered 2019-04-19: 4 mg via INTRAVENOUS

## 2019-04-19 MED ORDER — GELATIN ABSORBABLE 12-7 MM EX MISC
CUTANEOUS | Status: AC
  Filled 2019-04-19: qty 1

## 2019-04-19 MED ORDER — HYDRALAZINE HCL 20 MG/ML IJ SOLN
INTRAMUSCULAR | Status: AC
  Filled 2019-04-19: qty 1

## 2019-04-19 MED ORDER — FENTANYL CITRATE (PF) 100 MCG/2ML IJ SOLN
INTRAMUSCULAR | Status: DC | PRN
  Administered 2019-04-19 (×2): 25 ug via INTRAVENOUS

## 2019-04-19 MED ORDER — OXYCODONE HCL 5 MG/5ML PO SOLN: 5.0000 mg | Freq: Once | ORAL | Status: DC | PRN

## 2019-04-19 MED ORDER — PROPOFOL 10 MG/ML IV BOLUS
INTRAVENOUS | Status: AC
  Filled 2019-04-19: qty 20

## 2019-04-19 MED ORDER — ACETAMINOPHEN 10 MG/ML IV SOLN
INTRAVENOUS | Status: DC | PRN
  Administered 2019-04-19: 1000 mg via INTRAVENOUS

## 2019-04-19 MED ORDER — GLYCOPYRROLATE 0.2 MG/ML IJ SOLN
INTRAMUSCULAR | Status: DC | PRN
  Administered 2019-04-19 (×2): .2 mg via INTRAVENOUS

## 2019-04-19 MED ORDER — BUPIVACAINE LIPOSOME 1.3 % IJ SUSP
INTRAMUSCULAR | Status: DC | PRN
  Administered 2019-04-19: 20 mL

## 2019-04-19 MED ORDER — LACTATED RINGERS IV SOLN: INTRAVENOUS | Status: DC

## 2019-04-19 MED ORDER — FENTANYL CITRATE (PF) 100 MCG/2ML IJ SOLN: 25.0000 ug | INTRAMUSCULAR | Status: DC | PRN

## 2019-04-19 MED ORDER — LIDOCAINE HCL (CARDIAC) PF 100 MG/5ML IV SOSY
PREFILLED_SYRINGE | INTRAVENOUS | Status: DC | PRN
  Administered 2019-04-19: 80 mg via INTRAVENOUS
  Administered 2019-04-19: 20 mg via INTRAVENOUS

## 2019-04-19 MED ORDER — EPINEPHRINE PF 1 MG/ML IJ SOLN
INTRAMUSCULAR | Status: AC
  Filled 2019-04-19: qty 1

## 2019-04-19 MED ORDER — ACETAMINOPHEN 10 MG/ML IV SOLN
INTRAVENOUS | Status: AC
  Filled 2019-04-19: qty 100

## 2019-04-19 MED ORDER — HYDRALAZINE HCL 20 MG/ML IJ SOLN
10.0000 mg | Freq: Once | INTRAMUSCULAR | Status: AC
  Administered 2019-04-19: 10 mg via INTRAVENOUS
  Filled 2019-04-19: qty 0.5

## 2019-04-19 MED ORDER — DEXMEDETOMIDINE HCL IN NACL 80 MCG/20ML IV SOLN
INTRAVENOUS | Status: AC
  Filled 2019-04-19: qty 20

## 2019-04-19 MED ORDER — FENTANYL CITRATE (PF) 100 MCG/2ML IJ SOLN
INTRAMUSCULAR | Status: AC
  Filled 2019-04-19: qty 2

## 2019-04-19 MED ORDER — HYDRALAZINE HCL 20 MG/ML IJ SOLN
10.0000 mg | Freq: Once | INTRAMUSCULAR | Status: AC
  Administered 2019-04-19: 10 mg via INTRAVENOUS

## 2019-04-19 MED ORDER — DOCUSATE SODIUM 100 MG PO CAPS: 100.0000 mg | ORAL_CAPSULE | Freq: Two times a day (BID) | ORAL | 0 refills | Status: AC | PRN

## 2019-04-19 MED ORDER — OXYCODONE HCL 5 MG PO TABS: 5.0000 mg | ORAL_TABLET | Freq: Once | ORAL | Status: DC | PRN

## 2019-04-19 MED ORDER — BUPIVACAINE LIPOSOME 1.3 % IJ SUSP
INTRAMUSCULAR | Status: AC
  Filled 2019-04-19: qty 20

## 2019-04-19 MED ORDER — FAMOTIDINE 20 MG PO TABS: 20.0000 mg | ORAL_TABLET | Freq: Once | ORAL | Status: AC

## 2019-04-19 SURGICAL SUPPLY — 30 items
BLADE SURG 15 STRL LF DISP TIS (BLADE) ×1 IMPLANT
BLADE SURG 15 STRL SS (BLADE) ×2
BRIEF STRETCH MATERNITY 2XLG (MISCELLANEOUS) ×3 IMPLANT
CANISTER SUCT 1200ML W/VALVE (MISCELLANEOUS) ×3 IMPLANT
COVER WAND RF STERILE (DRAPES) ×3 IMPLANT
DRAPE PERI LITHO V/GYN (MISCELLANEOUS) ×3 IMPLANT
DRAPE UNDER BUTTOCK W/FLU (DRAPES) ×3 IMPLANT
DRSG GAUZE FLUFF 36X18 (GAUZE/BANDAGES/DRESSINGS) ×3 IMPLANT
ELECT REM PT RETURN 9FT ADLT (ELECTROSURGICAL) ×3
ELECTRODE REM PT RTRN 9FT ADLT (ELECTROSURGICAL) ×1 IMPLANT
GLOVE BIOGEL PI IND STRL 7.0 (GLOVE) ×1 IMPLANT
GLOVE BIOGEL PI INDICATOR 7.0 (GLOVE) ×2
GLOVE SURG SYN 6.5 ES PF (GLOVE) ×3 IMPLANT
GOWN STRL REUS W/ TWL LRG LVL3 (GOWN DISPOSABLE) ×2 IMPLANT
GOWN STRL REUS W/TWL LRG LVL3 (GOWN DISPOSABLE) ×4
KIT TURNOVER CYSTO (KITS) ×3 IMPLANT
LABEL OR SOLS (LABEL) ×3 IMPLANT
NEEDLE HYPO 22GX1.5 SAFETY (NEEDLE) ×3 IMPLANT
NEEDLE HYPO 25X1 1.5 SAFETY (NEEDLE) ×3 IMPLANT
NS IRRIG 500ML POUR BTL (IV SOLUTION) ×3 IMPLANT
PACK BASIN MINOR ARMC (MISCELLANEOUS) ×3 IMPLANT
PAD PREP 24X41 OB/GYN DISP (PERSONAL CARE ITEMS) ×3 IMPLANT
SHEARS HARMONIC 9CM CVD (BLADE) ×3 IMPLANT
SOL PREP PVP 2OZ (MISCELLANEOUS) ×3
SOLUTION PREP PVP 2OZ (MISCELLANEOUS) ×1 IMPLANT
SURGILUBE 2OZ TUBE FLIPTOP (MISCELLANEOUS) ×3 IMPLANT
SUT VIC AB 3-0 SH 27 (SUTURE) ×2
SUT VIC AB 3-0 SH 27X BRD (SUTURE) ×1 IMPLANT
SYR 10ML LL (SYRINGE) ×3 IMPLANT
SYR 20ML LL LF (SYRINGE) ×3 IMPLANT

## 2019-04-19 NOTE — Op Note (Addendum)
Preoperative diagnosis: Second degree internal hemorrhoids.   Postoperative diagnosis: Second degree internal hemorrhoids.  Procedure: exam under anesthesia, 2 column internal hemorrhoidectomy.  Surgeon: Lysle Pearl  Anesthesia: general  Specimen: internal hemorrhoids x2  Complications: none  EBL: 68mL  Wound classification: Clean Contaminated  Indications: Patient is a 79 y.o. female was found to have symptomatic hemorrhoids refractory to medical management.   Findings: 1.  Second degree internal hemorrhoids 2. Internal and external anal sphincter palpated and preserved 3. Adequate hemostasis  Description of procedure: The patient was brought to the operating room and general anesthesia was induced. Patient was placed high lithotomy position. A time-out was completed verifying correct patient, procedure, site, positioning, and implant(s) and/or special equipment prior to beginning this procedure. The perineum was prepped and draped in standard sterile fashion. Local anesthetic was injected as a perianal block. An anoscope was introduced and internal hemorrhoidal pedicles identified.  A harmonic was placed across the base of the left pedicle and excess internal hemorrhoidal tissue removed.  Specimen was passed off operative field pending pathology.  3-0 Vicryl then used to close the open wound.  A harmonic was placed across the base of the right anteriorpedicle and excess internal hemorrhoidal tissue removed.  Specimen was passed off operative field pending pathology.  3-0 Vicryl then used to close the open wound.  Hemostasis achieved with additional 3-0 vicryl suture in areas of bleeding until all active bleeding controlled.  Last inspection of the anal canal did not note any additional hemorrhoidal tissue and no other pathology. Exparel injected as a perianal block. A gauze pad was tucked between the gluteal folds, and secured in place with mesh underwear.  The patient tolerated the  procedure well and was taken to the postanesthesia care unit in stable condition.  Sponge and instrument count correct at end of procedure.

## 2019-04-19 NOTE — Transfer of Care (Signed)
Immediate Anesthesia Transfer of Care Note  Patient: SMITA HAUGH  Procedure(s) Performed: HEMORRHOIDECTOMY (N/A Rectum)  Patient Location: PACU  Anesthesia Type:General  Level of Consciousness: awake, alert  and oriented  Airway & Oxygen Therapy: Patient Spontanous Breathing and Patient connected to face mask oxygen  Post-op Assessment: Report given to RN and Post -op Vital signs reviewed and stable  Post vital signs: Reviewed and stable  Last Vitals:  Vitals Value Taken Time  BP    Temp    Pulse    Resp    SpO2      Last Pain:  Vitals:   04/19/19 0753  TempSrc: Tympanic  PainSc: 0-No pain         Complications: No apparent anesthesia complications

## 2019-04-19 NOTE — Anesthesia Procedure Notes (Signed)
Procedure Name: LMA Insertion Date/Time: 04/19/2019 9:10 AM Performed by: Chanetta Marshall, CRNA Pre-anesthesia Checklist: Patient identified, Emergency Drugs available, Suction available and Patient being monitored Patient Re-evaluated:Patient Re-evaluated prior to induction Oxygen Delivery Method: Circle system utilized Preoxygenation: Pre-oxygenation with 100% oxygen Induction Type: IV induction Ventilation: Mask ventilation without difficulty LMA: LMA inserted LMA Size: 3.0 Tube type: Oral Number of attempts: 1 Placement Confirmation: positive ETCO2,  breath sounds checked- equal and bilateral and CO2 detector Tube secured with: Tape Dental Injury: Teeth and Oropharynx as per pre-operative assessment

## 2019-04-19 NOTE — Progress Notes (Signed)
Pts BP 189/98 , Dr Vladimir Creeks made aware and pt given more Hydralazine

## 2019-04-19 NOTE — Anesthesia Postprocedure Evaluation (Signed)
Anesthesia Post Note  Patient: Courtney Lynn  Procedure(s) Performed: HEMORRHOIDECTOMY (N/A Rectum)  Patient location during evaluation: PACU Anesthesia Type: General Level of consciousness: awake and alert Pain management: pain level controlled Vital Signs Assessment: post-procedure vital signs reviewed and stable Respiratory status: spontaneous breathing, nonlabored ventilation, respiratory function stable and patient connected to nasal cannula oxygen Cardiovascular status: blood pressure returned to baseline and stable Postop Assessment: no apparent nausea or vomiting Anesthetic complications: no     Last Vitals:  Vitals:   04/19/19 1127 04/19/19 1200  BP: (!) 164/49 (!) 169/78  Pulse:    Resp: 20 (!) 22  Temp:    SpO2: 98% 98%    Last Pain:  Vitals:   04/19/19 1042  TempSrc: Temporal  PainSc: 0-No pain                 Precious Haws Jeniyah Menor

## 2019-04-19 NOTE — Interval H&P Note (Signed)
History and Physical Interval Note:  04/19/2019 8:35 AM  Courtney Lynn  has presented today for surgery, with the diagnosis of K64.1 Grade II Hemorrhoids.  The various methods of treatment have been discussed with the patient and family. After consideration of risks, benefits and other options for treatment, the patient has consented to  Procedure(s): HEMORRHOIDECTOMY (N/A) as a surgical intervention.  The patient's history has been reviewed, patient examined, no change in status, stable for surgery.  I have reviewed the patient's chart and labs.  Questions were answered to the patient's satisfaction.     Maddisen Vought Lysle Pearl

## 2019-04-19 NOTE — Discharge Instructions (Signed)
AMBULATORY SURGERY  DISCHARGE INSTRUCTIONS   1) The drugs that you were given will stay in your system until tomorrow so for the next 24 hours you should not:  A) Drive an automobile B) Make any legal decisions C) Drink any alcoholic beverage   2) You may resume regular meals tomorrow.  Today it is better to start with liquids and gradually work up to solid foods.  You may eat anything you prefer, but it is better to start with liquids, then soup and crackers, and gradually work up to solid foods.   3) Please notify your doctor immediately if you have any unusual bleeding, trouble breathing, redness and pain at the surgery site, drainage, fever, or pain not relieved by medication.    4) Additional Instructions:        Please contact your physician with any problems or Same Day Surgery at [phone], Monday through Friday 6 am to 4 pm, or [place] at [facility] number at [phone].hemorrhoidectomy, Care After This sheet gives you information about how to care for yourself after your procedure. Your health care provider may also give you more specific instructions. If you have problems or questions, contact your health care provider. What can I expect after the procedure? After the procedure, it is common to have:  Soreness.  Bruising.  Itching. Follow these instructions at home: site care Follow instructions from your health care provider about how to take care of your site. Make sure you:  Wash your hands with soap and water before and after you change your bandage (dressing). If soap and water are not available, use hand sanitizer.  Leave stitches (sutures), skin glue, or adhesive strips in place. These skin closures may need to stay in place for 2 weeks or longer. If adhesive strip edges start to loosen and curl up, you may trim the loose edges. Do not remove adhesive strips completely unless your health care provider tells you to do that.  If the area  bleeds or bruises, apply gentle pressure for 10 minutes.  OK TO SHOWER IN 24HRS  Check your site every day for signs of infection. Check for:  Redness, swelling, or pain.  Fluid or blood.  Warmth.  Pus or a bad smell.  General instructions  Rest and then return to your normal activities as tolerated .  tylenol as needed for discomfort.    .  Use narcotics,  only when tylenol is not enough to control pain. .  325-650mg  every 8hrs to max of 3000mg /24hrs for the tylenol.     Keep all follow-up visits as told by your health care provider. This is important. Contact a health care provider if:  You have redness, swelling, or pain around your site.  You have fluid or blood coming from your site.  Your site feels warm to the touch.  You have pus or a bad smell coming from your site.  You have a fever.  Your sutures, skin glue, or adhesive strips loosen or come off sooner than expected. Get help right away if:  You have bleeding that does not stop with pressure or a dressing. Summary  After the procedure, it is common to have some soreness, bruising, and itching at the site.  Follow instructions from your health care provider about how to take care of your site.  Check your site every day for signs of infection.  Contact a health care provider if you have redness, swelling, or pain around your site, or your  site feels warm to the touch.  Keep all follow-up visits as told by your health care provider. This is important. This information is not intended to replace advice given to you by your health care provider. Make sure you discuss any questions you have with your health care provider. Document Released: 02/14/2015 Document Revised: 07/18/2017 Document Reviewed: 07/18/2017 Elsevier Interactive Patient Education  [employer].

## 2019-04-19 NOTE — Anesthesia Preprocedure Evaluation (Addendum)
Anesthesia Evaluation  Patient identified by MRN, date of birth, ID band Patient awake    Reviewed: Allergy & Precautions, NPO status , Patient's Chart, lab work & pertinent test results, reviewed documented beta blocker date and time   History of Anesthesia Complications (+) DIFFICULT AIRWAY and history of anesthetic complications  Airway Mallampati: III  TM Distance: >3 FB     Dental  (+) Chipped   Pulmonary sleep apnea , former smoker,           Cardiovascular hypertension, Pt. on medications + Orthopnea  + dysrhythmias      Neuro/Psych PSYCHIATRIC DISORDERS Depression  Neuromuscular disease    GI/Hepatic GERD  ,  Endo/Other    Renal/GU      Musculoskeletal  (+) Arthritis ,   Abdominal   Peds  Hematology  (+) anemia ,   Anesthesia Other Findings Past Medical History: No date: Anemia No date: Arthritis No date: Body mass index (bmi) 37.0-37.9, adult 01/02/2016: Breast cancer ([place])     Comment:  right breast/radiation 2017: Cancer ([place])     Comment:  BREAST No date: Chronic insomnia No date: Complication of anesthesia     Comment:  has difficulty last surgery intubating No date: Depression No date: Difficult intubation     Comment:  WITH TKR No date: Dizziness No date: Dysrhythmia     Comment:  PVC'S,BRADYCARDIA No date: GERD (gastroesophageal reflux disease) No date: History of chicken pox No date: Hypertension No date: Hyponatremia No date: Intermittent vertigo No date: Irregular heart beat No date: Memory difficulties No date: Mild mitral regurgitation No date: 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[facility]) No date: MVP (mitral valve prolapse) No date: Orthopnea 2017: Personal history of radiation therapy No date: Pre-diabetes No date: PVC's (premature ventricular contractions) No date: Reflux No date: Sleep apnea     Comment:  CPAP  Reproductive/Obstetrics                             Anesthesia Physical  Anesthesia Plan  ASA: III  Anesthesia Plan:    Post-op Pain Management:    Induction: Intravenous  PONV Risk Score and Plan:   Airway Management Planned: LMA  Additional Equipment:   Intra-op Plan:   Post-operative Plan: Extubation in OR  Informed Consent: I have reviewed the patients History and Physical, chart, labs and discussed the procedure including the risks, benefits and alternatives for the proposed anesthesia with the patient or authorized representative who has indicated his/her understanding and acceptance.       Plan Discussed with: CRNA  Anesthesia Plan Comments:        Anesthesia Quick Evaluation

## 2019-04-20 LAB — SURGICAL PATHOLOGY

## 2019-05-01 ENCOUNTER — Encounter: Payer: Self-pay | Admitting: Podiatry

## 2019-05-01 ENCOUNTER — Other Ambulatory Visit: Payer: Self-pay

## 2019-05-01 ENCOUNTER — Ambulatory Visit: Payer: PPO | Admitting: Podiatry

## 2019-05-01 DIAGNOSIS — M205X1 Other deformities of toe(s) (acquired), right foot: Secondary | ICD-10-CM | POA: Diagnosis not present

## 2019-05-01 DIAGNOSIS — M2061 Acquired deformities of toe(s), unspecified, right foot: Secondary | ICD-10-CM | POA: Diagnosis not present

## 2019-05-01 DIAGNOSIS — M2010 Hallux valgus (acquired), unspecified foot: Secondary | ICD-10-CM

## 2019-05-01 NOTE — Progress Notes (Signed)
  Subjective:  Patient ID: GLYN STUTZMAN, female    DOB: [birth year],  MRN: [id number]  Chief Complaint  Patient presents with  . Foot Pain    pt is here for a possible flexor tenotomy of the right 2nd and 4th toe, pt states that pain is elevated when she is not wearing her foot padding, or toe covers.   79 y.o. female returns today for planned flexor tenotomy of the right second digit digit.  I will only focus on the second digit as for now as her pain is right at the distal tip of the second toe.  Pain is only when ambulating doing apply pressure over the toe course.  She has been applying the topical to the 4 digit which seems to be helping with that toe.  She denies any other acute complaints.  Objective:  There were no vitals filed for this visit.  General AA&O x3. Normal mood and affect.  Vascular Pedal pulses palpable.  Neurologic Epicritic sensation grossly intact.  Dermatologic Pre-ulcerative callus at the tip of the right, second toe  Orthopedic: Semi-reducible hammertoe deformity right, 2nd toe    Assessment & Plan:  Patient was evaluated and treated and all questions answered.  Hammertoe second toe right foot with pre-ulcerative callus -Flexor tenotomy as below. -Advised to remove the dressing in 24 hours and apply a band-aid and triple abx ointment every day thereafter.  Procedure: Flexor Tenotomy Indication for Procedure: toe with semi-reducible hammertoe with distal tip ulceration. Flexor tenotomy indicated to alleviate contracture, reduce pressure, and enhance healing of the ulceration. Location: right, 2nd toe Anesthesia: Lidocaine 1% plain; 1.5 mL and Marcaine 0.5% plain; 1.5 mL digital block Instrumentation: #15 blade Technique: The toe was anesthetized as above and prepped in the usual fashion. The toe was exsanquinated and a tourniquet was secured at the base of the toe. An 18g needle was then used to percutaneously release the flexor tendon at the plantar surface of  the toe with noted release of the hammertoe deformity.  A 3-0 Prolene was used to close the incision with 2 interrupted simple sutures.  No complications noted.  The incision was then dressed with antibiotic ointment and band-aid. Compression splint dressing applied. Patient tolerated the procedure well. Dressing: Dry, sterile, compression dressing. Disposition: Patient tolerated procedure well. Patient to return in 2 week for follow-up.      No follow-ups on file.

## 2019-05-03 DIAGNOSIS — G4733 Obstructive sleep apnea (adult) (pediatric): Secondary | ICD-10-CM | POA: Diagnosis not present

## 2019-05-15 ENCOUNTER — Other Ambulatory Visit: Payer: Self-pay

## 2019-05-15 ENCOUNTER — Ambulatory Visit: Payer: PPO | Admitting: Podiatry

## 2019-05-15 DIAGNOSIS — M2061 Acquired deformities of toe(s), unspecified, right foot: Secondary | ICD-10-CM | POA: Diagnosis not present

## 2019-05-15 DIAGNOSIS — M2041 Other hammer toe(s) (acquired), right foot: Secondary | ICD-10-CM

## 2019-05-16 ENCOUNTER — Encounter: Payer: Self-pay | Admitting: Podiatry

## 2019-05-16 NOTE — Progress Notes (Signed)
  Subjective:  Patient ID: Courtney Lynn, female    DOB: 02-Oct-1940,  MRN: [id number]  Chief Complaint  Patient presents with  . Foot Pain    pt is here for a 2 week f/u on a flexor tenotomy of the right second toe, pt states that she is feeling a lot better, with no other comments or concerns   79 y.o. female returns today follow-up from flexor tenotomy of the right second digit.  Patient is doing really well.  She does not have any pain in the second toe anymore.  She has been offloading it pretty aggressively.  I discussed with her shoe gear modification.  She denies any other acute complaints.  Objective:  There were no vitals filed for this visit.  General AA&O x3. Normal mood and affect.  Vascular Pedal pulses palpable.  Neurologic Epicritic sensation grossly intact.  Dermatologic Pre-ulcerative callus at the tip of the right, second toe  Orthopedic:  Good reduction and alignment noted after status post flexor tenotomy.   Assessment & Plan:  Patient was evaluated and treated and all questions answered.  Hammertoe second toe right foot with pre-ulcerative callus status post flexor tenotomy -Healed.  Patient has resolve meant of pain at the distal tip of the toe after undergoing flexor tenotomy. -Given that patient has clinically improved considerably, patient is officially discharged from my care.  She can return to see me as needed.  Patient states understanding      No follow-ups on file.

## 2019-05-29 DIAGNOSIS — M19012 Primary osteoarthritis, left shoulder: Secondary | ICD-10-CM | POA: Diagnosis not present

## 2019-05-29 DIAGNOSIS — M5030 Other cervical disc degeneration, unspecified cervical region: Secondary | ICD-10-CM | POA: Diagnosis not present

## 2019-05-29 DIAGNOSIS — M47812 Spondylosis without myelopathy or radiculopathy, cervical region: Secondary | ICD-10-CM | POA: Diagnosis not present

## 2019-05-29 DIAGNOSIS — M79602 Pain in left arm: Secondary | ICD-10-CM | POA: Diagnosis not present

## 2019-05-29 DIAGNOSIS — M7542 Impingement syndrome of left shoulder: Secondary | ICD-10-CM | POA: Diagnosis not present

## 2019-05-29 DIAGNOSIS — M5136 Other intervertebral disc degeneration, lumbar region: Secondary | ICD-10-CM | POA: Diagnosis not present

## 2019-06-02 DIAGNOSIS — G4733 Obstructive sleep apnea (adult) (pediatric): Secondary | ICD-10-CM | POA: Diagnosis not present

## 2019-06-04 DIAGNOSIS — R918 Other nonspecific abnormal finding of lung field: Secondary | ICD-10-CM | POA: Diagnosis not present

## 2019-06-13 DIAGNOSIS — I10 Essential (primary) hypertension: Secondary | ICD-10-CM | POA: Diagnosis not present

## 2019-06-13 DIAGNOSIS — F331 Major depressive disorder, recurrent, moderate: Secondary | ICD-10-CM | POA: Diagnosis not present

## 2019-06-13 DIAGNOSIS — I341 Nonrheumatic mitral (valve) prolapse: Secondary | ICD-10-CM | POA: Diagnosis not present

## 2019-06-13 DIAGNOSIS — N390 Urinary tract infection, site not specified: Secondary | ICD-10-CM | POA: Diagnosis not present

## 2019-06-13 DIAGNOSIS — D649 Anemia, unspecified: Secondary | ICD-10-CM | POA: Diagnosis not present

## 2019-06-13 DIAGNOSIS — Z6831 Body mass index (BMI) 31.0-31.9, adult: Secondary | ICD-10-CM | POA: Diagnosis not present

## 2019-06-13 DIAGNOSIS — Z9989 Dependence on other enabling machines and devices: Secondary | ICD-10-CM | POA: Diagnosis not present

## 2019-06-13 DIAGNOSIS — G4733 Obstructive sleep apnea (adult) (pediatric): Secondary | ICD-10-CM | POA: Diagnosis not present

## 2019-06-13 DIAGNOSIS — B9620 Unspecified Escherichia coli [E. coli] as the cause of diseases classified elsewhere: Secondary | ICD-10-CM | POA: Diagnosis not present

## 2019-06-13 DIAGNOSIS — E871 Hypo-osmolality and hyponatremia: Secondary | ICD-10-CM | POA: Diagnosis not present

## 2019-06-18 ENCOUNTER — Ambulatory Visit: Payer: PPO | Admitting: Podiatry

## 2019-06-18 ENCOUNTER — Other Ambulatory Visit: Payer: Self-pay

## 2019-06-18 ENCOUNTER — Encounter: Payer: Self-pay | Admitting: Podiatry

## 2019-06-18 DIAGNOSIS — B351 Tinea unguium: Secondary | ICD-10-CM

## 2019-06-18 DIAGNOSIS — M79675 Pain in left toe(s): Secondary | ICD-10-CM

## 2019-06-18 DIAGNOSIS — M79674 Pain in right toe(s): Secondary | ICD-10-CM | POA: Diagnosis not present

## 2019-06-18 NOTE — Progress Notes (Signed)
This patient returns to the office for evaluation and treatment of long thick painful nails .  This patient is unable to trim his own nails since the patient cannot reach his feet.  Patient says the nails are painful walking and wearing his shoes.  He returns for preventive foot care services.  General Appearance  Alert, conversant and in no acute stress.  Vascular  Dorsalis pedis and posterior tibial  pulses are palpable  bilaterally.  Capillary return is within normal limits  bilaterally. Temperature is within normal limits  bilaterally.  Neurologic  Senn-Weinstein monofilament wire test within normal limits  bilaterally. Muscle power within normal limits bilaterally.  Nails Thick disfigured discolored nails with subungual debris  from hallux to fifth toes bilaterally. No evidence of bacterial infection or drainage bilaterally.  Orthopedic  No limitations of motion  feet .  No crepitus or effusions noted.  HAV with contracted digits  B/L.  Skin  normotropic skin with no porokeratosis noted bilaterally.  No signs of infections or ulcers noted.     Onychomycosis  Pain in toes right foot  Pain in toes left foot  Debridement  of nails  1-5  B/L with a nail nipper.  Nails were then filed using a dremel tool with no incidents.    RTC 10 weeks    Thelonious Kauffmann DPM  

## 2019-06-20 DIAGNOSIS — G4733 Obstructive sleep apnea (adult) (pediatric): Secondary | ICD-10-CM | POA: Diagnosis not present

## 2019-06-20 DIAGNOSIS — D649 Anemia, unspecified: Secondary | ICD-10-CM | POA: Diagnosis not present

## 2019-06-20 DIAGNOSIS — Z6835 Body mass index (BMI) 35.0-35.9, adult: Secondary | ICD-10-CM | POA: Diagnosis not present

## 2019-06-20 DIAGNOSIS — E871 Hypo-osmolality and hyponatremia: Secondary | ICD-10-CM | POA: Diagnosis not present

## 2019-06-20 DIAGNOSIS — R413 Other amnesia: Secondary | ICD-10-CM | POA: Diagnosis not present

## 2019-06-20 DIAGNOSIS — Z9989 Dependence on other enabling machines and devices: Secondary | ICD-10-CM | POA: Diagnosis not present

## 2019-06-20 DIAGNOSIS — G8929 Other chronic pain: Secondary | ICD-10-CM | POA: Diagnosis not present

## 2019-06-20 DIAGNOSIS — F3340 Major depressive disorder, recurrent, in remission, unspecified: Secondary | ICD-10-CM | POA: Diagnosis not present

## 2019-06-20 DIAGNOSIS — M5441 Lumbago with sciatica, right side: Secondary | ICD-10-CM | POA: Diagnosis not present

## 2019-06-20 DIAGNOSIS — I10 Essential (primary) hypertension: Secondary | ICD-10-CM | POA: Diagnosis not present

## 2019-06-20 DIAGNOSIS — M170 Bilateral primary osteoarthritis of knee: Secondary | ICD-10-CM | POA: Diagnosis not present

## 2019-06-20 DIAGNOSIS — Z0000 Encounter for general adult medical examination without abnormal findings: Secondary | ICD-10-CM | POA: Diagnosis not present

## 2019-07-03 DIAGNOSIS — G4733 Obstructive sleep apnea (adult) (pediatric): Secondary | ICD-10-CM | POA: Diagnosis not present

## 2019-07-10 DIAGNOSIS — M5416 Radiculopathy, lumbar region: Secondary | ICD-10-CM | POA: Diagnosis not present

## 2019-07-10 DIAGNOSIS — M7061 Trochanteric bursitis, right hip: Secondary | ICD-10-CM | POA: Diagnosis not present

## 2019-07-10 DIAGNOSIS — M5136 Other intervertebral disc degeneration, lumbar region: Secondary | ICD-10-CM | POA: Diagnosis not present

## 2019-07-10 DIAGNOSIS — M62830 Muscle spasm of back: Secondary | ICD-10-CM | POA: Diagnosis not present

## 2019-07-10 DIAGNOSIS — M48062 Spinal stenosis, lumbar region with neurogenic claudication: Secondary | ICD-10-CM | POA: Diagnosis not present

## 2019-08-02 DIAGNOSIS — G4733 Obstructive sleep apnea (adult) (pediatric): Secondary | ICD-10-CM | POA: Diagnosis not present

## 2019-08-16 ENCOUNTER — Other Ambulatory Visit: Payer: Self-pay | Admitting: Internal Medicine

## 2019-08-16 DIAGNOSIS — M5432 Sciatica, left side: Secondary | ICD-10-CM

## 2019-08-16 DIAGNOSIS — I10 Essential (primary) hypertension: Secondary | ICD-10-CM | POA: Diagnosis not present

## 2019-08-16 DIAGNOSIS — M5416 Radiculopathy, lumbar region: Secondary | ICD-10-CM

## 2019-08-16 DIAGNOSIS — F3340 Major depressive disorder, recurrent, in remission, unspecified: Secondary | ICD-10-CM | POA: Diagnosis not present

## 2019-08-16 DIAGNOSIS — D649 Anemia, unspecified: Secondary | ICD-10-CM | POA: Diagnosis not present

## 2019-08-30 ENCOUNTER — Other Ambulatory Visit: Payer: Self-pay

## 2019-08-30 ENCOUNTER — Ambulatory Visit (INDEPENDENT_AMBULATORY_CARE_PROVIDER_SITE_OTHER): Payer: PPO | Admitting: Podiatry

## 2019-08-30 ENCOUNTER — Encounter: Payer: Self-pay | Admitting: Podiatry

## 2019-08-30 DIAGNOSIS — M79675 Pain in left toe(s): Secondary | ICD-10-CM | POA: Diagnosis not present

## 2019-08-30 DIAGNOSIS — B351 Tinea unguium: Secondary | ICD-10-CM

## 2019-08-30 DIAGNOSIS — M2061 Acquired deformities of toe(s), unspecified, right foot: Secondary | ICD-10-CM

## 2019-08-30 DIAGNOSIS — M79674 Pain in right toe(s): Secondary | ICD-10-CM | POA: Diagnosis not present

## 2019-08-30 DIAGNOSIS — M205X1 Other deformities of toe(s) (acquired), right foot: Secondary | ICD-10-CM

## 2019-08-30 NOTE — Progress Notes (Signed)
This patient returns to the office for evaluation and treatment of long thick painful nails .  This patient is unable to trim his own nails since the patient cannot reach his feet.  Patient says the nails are painful walking and wearing his shoes.  He returns for preventive foot care services.  General Appearance  Alert, conversant and in no acute stress.  Vascular  Dorsalis pedis and posterior tibial  pulses are palpable  bilaterally.  Capillary return is within normal limits  bilaterally. Temperature is within normal limits  bilaterally.  Neurologic  Senn-Weinstein monofilament wire test within normal limits  bilaterally. Muscle power within normal limits bilaterally.  Nails Thick disfigured discolored nails with subungual debris  from hallux to fifth toes bilaterally. No evidence of bacterial infection or drainage bilaterally.  Orthopedic  No limitations of motion  feet .  No crepitus or effusions noted.  HAV with contracted digits  B/L.  Skin  normotropic skin with no porokeratosis noted bilaterally.  No signs of infections or ulcers noted.     Onychomycosis  Pain in toes right foot  Pain in toes left foot  Debridement  of nails  1-5  B/L with a nail nipper.  Nails were then filed using a dremel tool with no incidents.    RTC 10 weeks    Gardiner Barefoot DPM

## 2019-09-01 ENCOUNTER — Ambulatory Visit
Admission: RE | Admit: 2019-09-01 | Discharge: 2019-09-01 | Disposition: A | Discharge status: Home / Self Care | Payer: PPO | Source: Ambulatory Visit | Attending: Internal Medicine | Admitting: Internal Medicine

## 2019-09-01 ENCOUNTER — Other Ambulatory Visit: Payer: Self-pay

## 2019-09-01 DIAGNOSIS — M5117 Intervertebral disc disorders with radiculopathy, lumbosacral region: Secondary | ICD-10-CM | POA: Diagnosis not present

## 2019-09-01 DIAGNOSIS — M4316 Spondylolisthesis, lumbar region: Secondary | ICD-10-CM | POA: Diagnosis not present

## 2019-09-01 DIAGNOSIS — M5416 Radiculopathy, lumbar region: Secondary | ICD-10-CM | POA: Diagnosis not present

## 2019-09-01 DIAGNOSIS — M48061 Spinal stenosis, lumbar region without neurogenic claudication: Secondary | ICD-10-CM | POA: Diagnosis not present

## 2019-09-01 DIAGNOSIS — M5432 Sciatica, left side: Secondary | ICD-10-CM

## 2019-09-01 DIAGNOSIS — M5116 Intervertebral disc disorders with radiculopathy, lumbar region: Secondary | ICD-10-CM | POA: Diagnosis not present

## 2019-09-01 MED ORDER — GADOBUTROL 1 MMOL/ML IV SOLN
7.5000 mL | Freq: Once | INTRAVENOUS | Status: AC | PRN
  Administered 2019-09-01: 7.5 mL via INTRAVENOUS

## 2019-09-02 DIAGNOSIS — G4733 Obstructive sleep apnea (adult) (pediatric): Secondary | ICD-10-CM | POA: Diagnosis not present

## 2019-09-04 DIAGNOSIS — M5136 Other intervertebral disc degeneration, lumbar region: Secondary | ICD-10-CM | POA: Diagnosis not present

## 2019-09-04 DIAGNOSIS — M5416 Radiculopathy, lumbar region: Secondary | ICD-10-CM | POA: Diagnosis not present

## 2019-09-04 DIAGNOSIS — M5126 Other intervertebral disc displacement, lumbar region: Secondary | ICD-10-CM | POA: Diagnosis not present

## 2019-09-06 DIAGNOSIS — M4316 Spondylolisthesis, lumbar region: Secondary | ICD-10-CM | POA: Diagnosis not present

## 2019-09-06 DIAGNOSIS — M4310 Spondylolisthesis, site unspecified: Secondary | ICD-10-CM | POA: Diagnosis not present

## 2019-09-06 DIAGNOSIS — M47816 Spondylosis without myelopathy or radiculopathy, lumbar region: Secondary | ICD-10-CM | POA: Diagnosis not present

## 2019-09-06 DIAGNOSIS — M4186 Other forms of scoliosis, lumbar region: Secondary | ICD-10-CM | POA: Diagnosis not present

## 2019-09-12 DIAGNOSIS — M545 Low back pain: Secondary | ICD-10-CM | POA: Diagnosis not present

## 2019-09-12 DIAGNOSIS — M4310 Spondylolisthesis, site unspecified: Secondary | ICD-10-CM | POA: Diagnosis not present

## 2019-10-03 DIAGNOSIS — M4310 Spondylolisthesis, site unspecified: Secondary | ICD-10-CM | POA: Diagnosis not present

## 2019-10-03 DIAGNOSIS — M545 Low back pain: Secondary | ICD-10-CM | POA: Diagnosis not present

## 2019-10-03 DIAGNOSIS — G4733 Obstructive sleep apnea (adult) (pediatric): Secondary | ICD-10-CM | POA: Diagnosis not present

## 2019-10-09 DIAGNOSIS — M5416 Radiculopathy, lumbar region: Secondary | ICD-10-CM | POA: Diagnosis not present

## 2019-10-09 DIAGNOSIS — M5126 Other intervertebral disc displacement, lumbar region: Secondary | ICD-10-CM | POA: Diagnosis not present

## 2019-10-16 DIAGNOSIS — K649 Unspecified hemorrhoids: Secondary | ICD-10-CM | POA: Diagnosis not present

## 2019-10-16 DIAGNOSIS — K625 Hemorrhage of anus and rectum: Secondary | ICD-10-CM | POA: Diagnosis not present

## 2019-10-17 DIAGNOSIS — R413 Other amnesia: Secondary | ICD-10-CM | POA: Diagnosis not present

## 2019-10-22 DIAGNOSIS — E871 Hypo-osmolality and hyponatremia: Secondary | ICD-10-CM | POA: Diagnosis not present

## 2019-10-22 DIAGNOSIS — R413 Other amnesia: Secondary | ICD-10-CM | POA: Diagnosis not present

## 2019-10-22 DIAGNOSIS — Z9989 Dependence on other enabling machines and devices: Secondary | ICD-10-CM | POA: Diagnosis not present

## 2019-10-22 DIAGNOSIS — F325 Major depressive disorder, single episode, in full remission: Secondary | ICD-10-CM | POA: Diagnosis not present

## 2019-10-22 DIAGNOSIS — G4733 Obstructive sleep apnea (adult) (pediatric): Secondary | ICD-10-CM | POA: Diagnosis not present

## 2019-10-22 DIAGNOSIS — D649 Anemia, unspecified: Secondary | ICD-10-CM | POA: Diagnosis not present

## 2019-10-22 DIAGNOSIS — I10 Essential (primary) hypertension: Secondary | ICD-10-CM | POA: Diagnosis not present

## 2019-10-23 DIAGNOSIS — M5416 Radiculopathy, lumbar region: Secondary | ICD-10-CM | POA: Diagnosis not present

## 2019-10-23 DIAGNOSIS — M5126 Other intervertebral disc displacement, lumbar region: Secondary | ICD-10-CM | POA: Diagnosis not present

## 2019-10-25 ENCOUNTER — Other Ambulatory Visit: Payer: Self-pay

## 2019-10-25 ENCOUNTER — Ambulatory Visit: Payer: PPO | Admitting: Dermatology

## 2019-10-25 ENCOUNTER — Encounter: Payer: Self-pay | Admitting: Dermatology

## 2019-10-25 DIAGNOSIS — D229 Melanocytic nevi, unspecified: Secondary | ICD-10-CM | POA: Diagnosis not present

## 2019-10-25 DIAGNOSIS — Z1283 Encounter for screening for malignant neoplasm of skin: Secondary | ICD-10-CM | POA: Diagnosis not present

## 2019-10-25 DIAGNOSIS — L578 Other skin changes due to chronic exposure to nonionizing radiation: Secondary | ICD-10-CM

## 2019-10-25 DIAGNOSIS — D1800 Hemangioma unspecified site: Secondary | ICD-10-CM | POA: Diagnosis not present

## 2019-10-25 DIAGNOSIS — Z853 Personal history of malignant neoplasm of breast: Secondary | ICD-10-CM | POA: Diagnosis not present

## 2019-10-25 DIAGNOSIS — D239 Other benign neoplasm of skin, unspecified: Secondary | ICD-10-CM

## 2019-10-25 DIAGNOSIS — L821 Other seborrheic keratosis: Secondary | ICD-10-CM

## 2019-10-25 DIAGNOSIS — D235 Other benign neoplasm of skin of trunk: Secondary | ICD-10-CM

## 2019-10-25 DIAGNOSIS — L814 Other melanin hyperpigmentation: Secondary | ICD-10-CM

## 2019-10-25 NOTE — Progress Notes (Signed)
   Follow-Up Visit   Subjective  Courtney Lynn is a 79 y.o. female who presents for the following: Annual Exam (No history of skin cancer or abnormal moles - nothing new or changing today. TBSE today). The patient presents for Total-Body Skin Exam (TBSE) for skin cancer screening and mole check.  The following portions of the chart were reviewed this encounter and updated as appropriate:  Tobacco  Allergies  Meds  Problems  Med Hx  Surg Hx  Fam Hx     Review of Systems:  No other skin or systemic complaints except as noted in HPI or Assessment and Plan.  Objective  Well appearing patient in no apparent distress; mood and affect are within normal limits.  A full examination was performed including scalp, head, eyes, ears, nose, lips, neck, chest, axillae, abdomen, back, buttocks, bilateral upper extremities, bilateral lower extremities, hands, feet, fingers, toes, fingernails, and toenails. All findings within normal limits unless otherwise noted below.  Objective  Left Upper Back: Firm pink/brown papulenodule with dimple sign.   Objective  Right Breast: Clear. No lymphadenopathy.   Assessment & Plan    Lentigines - Scattered tan macules - Discussed due to sun exposure - Benign, observe - Call for any changes  Seborrheic Keratoses - Stuck-on, waxy, tan-brown papules and plaques  - Discussed benign etiology and prognosis. - Observe - Call for any changes  Melanocytic Nevi - Tan-brown and/or pink-flesh-colored symmetric macules and papules - Benign appearing on exam today - Observation - Call clinic for new or changing moles - Recommend daily use of broad spectrum spf 30+ sunscreen to sun-exposed areas.   Hemangiomas - Red papules - Discussed benign nature - Observe - Call for any changes  Actinic Damage - diffuse scaly erythematous macules with underlying dyspigmentation - Recommend daily broad spectrum sunscreen SPF 30+ to sun-exposed areas, reapply every  2 hours as needed.  - Call for new or changing lesions.  Skin cancer screening performed today.  Dermatofibroma Left Upper Back Benign, observe.    History of breast cancer Right Breast No lymphadenopathy Clear, no evidence of recurrence   Return if symptoms worsen or fail to improve.   I, Ashok Cordia, CMA, am acting as scribe for Sarina Ser, MD .  Documentation: I have reviewed the above documentation for accuracy and completeness, and I agree with the above.  Sarina Ser, MD

## 2019-11-08 ENCOUNTER — Other Ambulatory Visit: Payer: Self-pay

## 2019-11-08 ENCOUNTER — Ambulatory Visit: Payer: PPO | Admitting: Podiatry

## 2019-11-08 ENCOUNTER — Encounter: Payer: Self-pay | Admitting: Podiatry

## 2019-11-08 DIAGNOSIS — M79675 Pain in left toe(s): Secondary | ICD-10-CM

## 2019-11-08 DIAGNOSIS — M79674 Pain in right toe(s): Secondary | ICD-10-CM

## 2019-11-08 DIAGNOSIS — M2040 Other hammer toe(s) (acquired), unspecified foot: Secondary | ICD-10-CM

## 2019-11-08 DIAGNOSIS — M2010 Hallux valgus (acquired), unspecified foot: Secondary | ICD-10-CM | POA: Diagnosis not present

## 2019-11-08 DIAGNOSIS — B351 Tinea unguium: Secondary | ICD-10-CM

## 2019-11-08 NOTE — Progress Notes (Signed)
This patient returns to the office for evaluation and treatment of long thick painful nails .  This patient is unable to trim her own nails since the patient cannot reach her feet.  Patient says the nails are painful walking and wearing his shoes.  She returns for preventive foot care services.  General Appearance  Alert, conversant and in no acute stress.  Vascular  Dorsalis pedis and posterior tibial  pulses are palpable  bilaterally.  Capillary return is within normal limits  bilaterally. Temperature is within normal limits  bilaterally.  Neurologic  Senn-Weinstein monofilament wire test within normal limits  bilaterally. Muscle power within normal limits bilaterally.  Nails Thick disfigured discolored nails with subungual debris  from hallux to fifth toes bilaterally. No evidence of bacterial infection or drainage bilaterally.  Orthopedic  No limitations of motion  feet .  No crepitus or effusions noted.  HAV with contracted digits  B/L.  Skin  normotropic skin with no porokeratosis noted bilaterally.  No signs of infections or ulcers noted.     Onychomycosis  Pain in toes right foot  Pain in toes left foot  Debridement  of nails  1-5  B/L with a nail nipper.  Nails were then filed using a dremel tool with no incidents.    RTC 10 weeks  Gel-padding for bunion dispensed.  Gardiner Barefoot DPM

## 2020-01-17 ENCOUNTER — Encounter: Payer: Self-pay | Admitting: Podiatry

## 2020-01-17 ENCOUNTER — Ambulatory Visit (INDEPENDENT_AMBULATORY_CARE_PROVIDER_SITE_OTHER): Payer: PPO | Admitting: Podiatry

## 2020-01-17 ENCOUNTER — Other Ambulatory Visit: Payer: Self-pay

## 2020-01-17 DIAGNOSIS — M2040 Other hammer toe(s) (acquired), unspecified foot: Secondary | ICD-10-CM

## 2020-01-17 DIAGNOSIS — B351 Tinea unguium: Secondary | ICD-10-CM | POA: Diagnosis not present

## 2020-01-17 DIAGNOSIS — M205X1 Other deformities of toe(s) (acquired), right foot: Secondary | ICD-10-CM

## 2020-01-17 DIAGNOSIS — M2010 Hallux valgus (acquired), unspecified foot: Secondary | ICD-10-CM

## 2020-01-17 DIAGNOSIS — M79675 Pain in left toe(s): Secondary | ICD-10-CM | POA: Diagnosis not present

## 2020-01-17 DIAGNOSIS — M79674 Pain in right toe(s): Secondary | ICD-10-CM | POA: Diagnosis not present

## 2020-01-17 NOTE — Progress Notes (Signed)
This patient returns to the office for evaluation and treatment of long thick painful nails .  This patient is unable to trim her own nails since the patient cannot reach her feet.  Patient says the nails are painful walking and wearing his shoes.  She returns for preventive foot care services.  General Appearance  Alert, conversant and in no acute stress.  Vascular  Dorsalis pedis and posterior tibial  pulses are palpable  bilaterally.  Capillary return is within normal limits  bilaterally. Temperature is within normal limits  bilaterally.  Neurologic  Senn-Weinstein monofilament wire test within normal limits  bilaterally. Muscle power within normal limits bilaterally.  Nails Thick disfigured discolored nails with subungual debris  from hallux to fifth toes bilaterally. No evidence of bacterial infection or drainage bilaterally.  Orthopedic  No limitations of motion  feet .  No crepitus or effusions noted.  HAV with contracted digits  B/L.  Skin  normotropic skin with no porokeratosis noted bilaterally.  No signs of infections or ulcers noted.     Onychomycosis  Pain in toes right foot  Pain in toes left foot  Debridement  of nails  1-5  B/L with a nail nipper.  Nails were then filed using a dremel tool with no incidents.    RTC 10 weeks    Gardiner Barefoot DPM

## 2020-01-31 ENCOUNTER — Emergency Department: Payer: PPO

## 2020-01-31 ENCOUNTER — Encounter: Payer: Self-pay | Admitting: Emergency Medicine

## 2020-01-31 ENCOUNTER — Emergency Department
Admission: EM | Admit: 2020-01-31 | Discharge: 2020-01-31 | Disposition: A | Discharge status: Home / Self Care | Payer: PPO | Attending: Emergency Medicine | Admitting: Emergency Medicine

## 2020-01-31 ENCOUNTER — Other Ambulatory Visit: Payer: Self-pay

## 2020-01-31 DIAGNOSIS — Z79899 Other long term (current) drug therapy: Secondary | ICD-10-CM | POA: Diagnosis not present

## 2020-01-31 DIAGNOSIS — G319 Degenerative disease of nervous system, unspecified: Secondary | ICD-10-CM | POA: Diagnosis not present

## 2020-01-31 DIAGNOSIS — I10 Essential (primary) hypertension: Secondary | ICD-10-CM | POA: Diagnosis not present

## 2020-01-31 DIAGNOSIS — R52 Pain, unspecified: Secondary | ICD-10-CM | POA: Diagnosis not present

## 2020-01-31 DIAGNOSIS — J320 Chronic maxillary sinusitis: Secondary | ICD-10-CM | POA: Diagnosis not present

## 2020-01-31 DIAGNOSIS — M542 Cervicalgia: Secondary | ICD-10-CM | POA: Insufficient documentation

## 2020-01-31 DIAGNOSIS — W19XXXA Unspecified fall, initial encounter: Secondary | ICD-10-CM | POA: Diagnosis not present

## 2020-01-31 DIAGNOSIS — Z87891 Personal history of nicotine dependence: Secondary | ICD-10-CM | POA: Insufficient documentation

## 2020-01-31 DIAGNOSIS — S0990XA Unspecified injury of head, initial encounter: Secondary | ICD-10-CM | POA: Diagnosis not present

## 2020-01-31 DIAGNOSIS — J3489 Other specified disorders of nose and nasal sinuses: Secondary | ICD-10-CM | POA: Diagnosis not present

## 2020-01-31 DIAGNOSIS — S199XXA Unspecified injury of neck, initial encounter: Secondary | ICD-10-CM | POA: Diagnosis not present

## 2020-01-31 DIAGNOSIS — W010XXA Fall on same level from slipping, tripping and stumbling without subsequent striking against object, initial encounter: Secondary | ICD-10-CM | POA: Insufficient documentation

## 2020-01-31 DIAGNOSIS — S0083XA Contusion of other part of head, initial encounter: Secondary | ICD-10-CM | POA: Insufficient documentation

## 2020-01-31 DIAGNOSIS — Z853 Personal history of malignant neoplasm of breast: Secondary | ICD-10-CM | POA: Diagnosis not present

## 2020-01-31 DIAGNOSIS — M47812 Spondylosis without myelopathy or radiculopathy, cervical region: Secondary | ICD-10-CM | POA: Diagnosis not present

## 2020-01-31 DIAGNOSIS — Z981 Arthrodesis status: Secondary | ICD-10-CM | POA: Diagnosis not present

## 2020-01-31 NOTE — ED Provider Notes (Signed)
[facility] Emergency Department Provider Note  ____________________________________________  Time seen: Approximately 7:29 PM  I have reviewed the triage vital signs and the nursing notes.   HISTORY  Chief Complaint Fall    HPI Courtney Lynn is a 79 y.o. female who presents the emergency department for evaluation of injury after a fall.  Patient states that she tripped over a box where she was living, fell striking her head on the carpeted floor.  Patient had bruising and a slight abrasion to the forehead.  She denies any loss of consciousness.  She is not on any anticoagulation.  Patient with slight  abrasion of the forehead.  Ecchymosis around the right eye.  Patient denies any headache, visual changes.  She does have some mild soreness of her neck but has full range of motion to her neck.  No other injury or complaint.  No medications prior to arrival.  Medical history as described below with no complaints of chronic medical issues.        Past Medical History:  Diagnosis Date  . Anemia   . Arthritis   . Body mass index (bmi) 37.0-37.9, adult   . Breast cancer ([place]) 01/02/2016   right breast/radiation  . Cancer ([place]) 2017   BREAST  . Chronic insomnia   . Complication of anesthesia    has difficulty last surgery intubating  . Depression   . Difficult intubation    WITH TKR  . Dizziness   . Dysrhythmia    PVC'S,BRADYCARDIA  . GERD (gastroesophageal reflux disease)   . History of chicken pox   . Hypertension   . Hyponatremia   . Intermittent vertigo   . Irregular heart beat   . Memory difficulties   . Mild mitral regurgitation   . 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[place])   . MVP (mitral valve prolapse)   . Orthopnea   . Personal history of radiation therapy 2017  . Pre-diabetes   . PVC's (premature ventricular contractions)   . Reflux   . Sleep apnea    CPAP    Patient Active Problem List   Diagnosis Date Noted  . Major depressive  disorder, recurrent, in remission ([place]) 10/17/2018  . Loss of memory 03/14/2018  . Poorly-controlled hypertension 01/05/2018  . Hemorrhoids 12/15/2017  . 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  . Postmenopausal osteoporosis 11/23/2017  . Lumbar radiculopathy 07/14/2017  . Osteopenia of the elderly 05/24/2017  . Dizziness 12/13/2015  . Intermittent vertigo 12/13/2015  . 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 ([place]) 12/07/2015  . Fracture of left foot 10/26/2015  . OSA on CPAP 04/14/2015  . Nonrheumatic mitral valve insufficiency 09/10/2014  . 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[place]) 09/10/2014  . Nonrheumatic mitral valve prolapse 09/10/2014  . Premature ventricular contractions 09/10/2014  . Other secondary pulmonary hypertension ([place]) 09/10/2014  . Ventricular premature depolarization 09/10/2014  . 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 Hypertension 07/01/2014  . Hyponatremia 07/01/2014  . Hyposmolality and/or hyponatremia 07/01/2014  . Chronic anemia 04/23/2014  . Anemia 04/23/2014  . Body mass index (BMI) of 37.0-37.9 in adult 03/28/2014  . Chronic insomnia 03/28/2014  . Depressive disorder 03/28/2014  . Essential hypertension 03/28/2014  . Gastroesophageal reflux disease without esophagitis 03/28/2014  . Pre-diabetes 03/28/2014  . 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 Insomnia 03/28/2014  . Depression, major, single episode, complete remission ([place]) 03/28/2014  . Other obesity 03/28/2014  . Acute bronchitis, viral 01/30/2014    Past Surgical History:  Procedure Laterality Date  .  APPENDECTOMY    . BREAST BIOPSY Right 01/02/2016   PARTIAL MASTECTOMY- IMC and DCIS  . BREAST LUMPECTOMY Right 2017  . CATARACT EXTRACTION W/PHACO Right 09/14/2016   Procedure: CATARACT EXTRACTION PHACO AND INTRAOCULAR LENS PLACEMENT (IOC);  Surgeon: Galen Manila, MD;  Location: [facility] ORS;  Service: Ophthalmology;  Laterality: Right;  [place] 00:32 AP% 19.5 CDE 6.28 Fluid pack lot # [id number]   . CATARACT EXTRACTION W/PHACO Left 10/05/2016   Procedure: CATARACT EXTRACTION PHACO AND INTRAOCULAR LENS PLACEMENT (IOC);  Surgeon: Galen Manila, MD;  Location: [facility] ORS;  Service: Ophthalmology;  Laterality: Left;  [place] 00:46.9 AP% 16.0 CDE 7.48 Fluid Pack lot # [id number]  . CHOLECYSTECTOMY    . COLONOSCOPY WITH PROPOFOL N/A 08/24/2016   Procedure: COLONOSCOPY WITH PROPOFOL;  Surgeon: Christena Deem, MD;  Location: [facility] ENDOSCOPY;  Service: Endoscopy;  Laterality: N/A;  . COLONOSCOPY WITH PROPOFOL N/A 07/12/2018   Procedure: COLONOSCOPY WITH PROPOFOL;  Surgeon: Toledo, Boykin Nearing, MD;  Location: [facility] ENDOSCOPY;  Service: Gastroenterology;  Laterality: N/A;  . dental implants    . FOOT SURGERY Right   . FRACTURE SURGERY     KNEE CAP  . HEMORRHOID SURGERY N/A 04/19/2019   Procedure: HEMORRHOIDECTOMY;  Surgeon: Sung Amabile, DO;  Location: [facility] ORS;  Service: General;  Laterality: N/A;  . JOINT REPLACEMENT Right 2011   KNEE  . PARTIAL MASTECTOMY WITH NEEDLE LOCALIZATION Right 01/02/2016   Procedure: PARTIAL MASTECTOMY WITH NEEDLE LOCALIZATION;  Surgeon: Nadeen Landau, MD;  Location: [facility] ORS;  Service: General;  Laterality: Right;  . PATELLA FRACTURE SURGERY    . REPLACEMENT TOTAL KNEE Right   . SENTINEL NODE BIOPSY Right 01/02/2016   Procedure: SENTINEL NODE BIOPSY;  Surgeon: Nadeen Landau, MD;  Location: [facility] ORS;  Service: General;  Laterality: Right;  . TONSILLECTOMY      Prior to Admission medications   Medication Sig Start Date End Date Taking? Authorizing Provider  acetaminophen (TYLENOL) 500 MG tablet Take 1,000 mg by mouth at bedtime.    [provider]  ANUCORT-HC 25 MG suppository Place 25 mg rectally 2 (two) times daily. 10/16/19   [provider]  benzonatate (TESSALON) 100 MG capsule Take by mouth.    [provider]  citalopram (CELEXA) 10 MG tablet Take 10 mg by mouth every other day. In the evening    [provider]   docusate sodium (COLACE) 100 MG capsule Take 100 mg by mouth daily.     [provider]  donepezil (ARICEPT) 10 MG tablet Take by mouth. 10/17/19   [provider]  felodipine (PLENDIL) 5 MG 24 hr tablet Take by mouth. 09/04/19 03/02/20  [provider]  gabapentin (NEURONTIN) 100 MG capsule Take 100 mg by mouth at bedtime. 11/05/19   [provider]  hydrALAZINE (APRESOLINE) 50 MG tablet Take 50 mg by mouth 2 (two) times daily. 03/21/19   [provider]  iron polysaccharides (NIFEREX) 150 MG capsule Take by mouth. 10/22/19   [provider]  Lactase (LACTAID PO) Take by mouth.    [provider]  meloxicam (MOBIC) 15 MG tablet TAKE 1 TABLET(15 MG) BY MOUTH EVERY DAY 02/22/19   [provider]  methocarbamol (ROBAXIN) 500 MG tablet TAKE 1/2 TO 1 TABLET BY MOUTH EVERY NIGHT AT BEDTIME AS NEEDED 03/15/19   [provider]  metoprolol succinate (TOPROL-XL) 25 MG 24 hr tablet Take 25 mg by mouth daily. 01/01/19   [provider]  Multiple Vitamin (MULTIVITAMIN) tablet  Take 1 tablet by mouth daily. Osteo matrix 1 a day, omega 10 1 a day    [provider]  Multiple Vitamins-Minerals (MEMORY VITE PO) Take 1 capsule by mouth 2 (two) times daily.    [provider]  NON FORMULARY Take 1 capsule by mouth at bedtime. OmegaGuard (active ingredients) Fish oil 1200 mg-Omega 3 667 mg-EPA 363 MG-DHA 240 MG    [provider]  NONFORMULARY OR COMPOUNDED ITEM Take 1-2 capsules by mouth 3 (three) times daily. LunaRich X (Lunasin-soy peptide) 2 capsule in the morning, 2 capsules mid-day, and 1 capsule at bedtime.    [provider]  Nutritional Supplements (NUTRITIONAL SUPPLEMENT PO) Take 1 capsule by mouth daily. Protandim Nrf2 Synergizer Milk thistle (Silybum marianum) extract (225 mg) Bacopa (Bacopa monnieri) extract (150 mg) Ashwagandha (Withania somnifera) root (150 mg) Green tea (Camellia  sinensis) extract (75 mg) Turmeric (Curcuma longa) extract (75 mg)    [provider]  olmesartan (BENICAR) 40 MG tablet Take 40 mg by mouth daily.  03/25/18   [provider]  omeprazole (PRILOSEC) 20 MG capsule TAKE 1 CAPSULE BY MOUTH EVERY DAY 08/19/16   [provider]  OVER THE COUNTER MEDICATION Take 1 capsule by mouth at bedtime. Joint Health Complex  (active ingredients) 60 mg Vitamin C-1.5 mg Zinc-0.2 mg Copper-0.2 mg Manganese-1500 mg Glucosamine-100 mg [place]ia    [provider]  predniSONE (DELTASONE) 10 MG tablet Take 10 mg by mouth 2 (two) times daily. 08/16/19   [provider]  Probiotic Product (PROBIOTIC PO) Take 1 capsule by mouth daily.    [provider]  traMADol (ULTRAM) 50 MG tablet Take 50 mg by mouth daily as needed.  03/14/19   [provider]    Allergies Cleocin [clindamycin hcl] and Pen vk [penicillin v]  Family History  Problem Relation Age of Onset  . GI Bleed Mother   . Diabetes Father   . Congestive Heart Failure Father   . Breast cancer Neg Hx     Social History Social History   Tobacco Use  . Smoking status: Former Research scientist (life sciences)  . Smokeless tobacco: Never Used  . Tobacco comment: Quit 1969  Vaping Use  . Vaping Use: Never used  Substance Use Topics  . Alcohol use: Yes    Alcohol/week: 7.0 standard drinks    Types: 7 Glasses of wine per week    Comment: OCCASIONALLY  . Drug use: No     Review of Systems  Constitutional: No fever/chills.  Fall striking her head with no loss of consciousness.  Slight abrasion to the forehead and bruising around the right eye Eyes: No visual changes. No discharge ENT: No upper respiratory complaints. Cardiovascular: no chest pain. Respiratory: no cough. No SOB. Gastrointestinal: No abdominal pain.  No nausea, no vomiting.  No diarrhea.  No constipation. Musculoskeletal: Negative for musculoskeletal pain. Skin: Negative for rash, abrasions,  lacerations, ecchymosis. Neurological: Negative for headaches, focal weakness or numbness.  10 System ROS otherwise negative.  ____________________________________________   PHYSICAL EXAM:  VITAL SIGNS: ED Triage Vitals  Enc Vitals Group     BP 01/31/20 1706 (!) 195/72     Pulse Rate 01/31/20 1706 (!) 55     Resp 01/31/20 1706 18     Temp 01/31/20 1706 98 F (36.7 C)     Temp Source 01/31/20 1706 Oral     SpO2 01/31/20 1706 96 %     Weight 01/31/20 1707 185 lb (83.9 kg)  Height 01/31/20 1707 5\' 1"  (1.549 m)     Head Circumference --      Peak Flow --      Pain Score 01/31/20 1706 8     Pain Loc --      Pain Edu? --      Excl. in [place]? --      Constitutional: Alert and oriented. Well appearing and in no acute distress. Eyes: Conjunctivae are normal. PERRL. EOMI. Head: Very superficial abrasion noted to the forehead with no bleeding.  No visible foreign body.  Mild underlying hematoma with extension of ecchymosis into the bilateral periorbital regions.  Ecchymosis is worse about the right orbit when compared with left.  No tenderness to palpation of the osseous structures of the face or skull.  No battle signs, raccoon eyes, serosanguineous fluid drainage from the ears or nares. ENT:      Ears:       Nose: No congestion/rhinnorhea.      Mouth/Throat: Mucous membranes are moist.  Neck: No stridor.  No cervical spine tenderness to palpation.  Cardiovascular: Normal rate, regular rhythm. Normal S1 and S2.  Good peripheral circulation. Respiratory: Normal respiratory effort without tachypnea or retractions. Lungs CTAB. Good air entry to the bases with no decreased or absent breath sounds. Musculoskeletal: Full range of motion to all extremities. No gross deformities appreciated. Neurologic:  Normal speech and language. No gross focal neurologic deficits are appreciated.  Cranial nerves II through XII grossly intact.  Negative Romberg's and pronator drift. Skin:  Skin is warm,  dry and intact. No rash noted. Psychiatric: Mood and affect are normal. Speech and behavior are normal. Patient exhibits appropriate insight and judgement.   ____________________________________________   LABS (all labs ordered are listed, but only abnormal results are displayed)  Labs Reviewed - No data to display ____________________________________________  EKG   ____________________________________________  RADIOLOGY I personally viewed and evaluated these images as part of my medical decision making, as well as reviewing the written report by the radiologist.  ED Provider Interpretation: Concur with radiologist finding of no acute osseous or intracranial abnormality  CT Head Wo Contrast  Result Date: 01/31/2020 CLINICAL DATA:  Tripped and fell, right periorbital bruising EXAM: CT HEAD WITHOUT CONTRAST TECHNIQUE: Contiguous axial images were obtained from the base of the skull through the vertex without intravenous contrast. COMPARISON:  02/03/2018 FINDINGS: Brain: No acute infarct or hemorrhage. Lateral ventricles and midline structures are unremarkable. No acute extra-axial fluid collections. No mass effect. Stable cerebral atrophy. Vascular: No hyperdense vessel or unexpected calcification. Skull: Normal. Negative for fracture or focal lesion. Sinuses/Orbits: Stable polypoid mucosal thickening left maxillary sinus. Remaining sinuses are clear. Other: None. IMPRESSION: 1. No acute intracranial process. Electronically Signed   By: Randa Ngo M.D.   On: 01/31/2020 17:40   CT Cervical Spine Wo Contrast  Result Date: 01/31/2020 CLINICAL DATA:  Tripped and fell, right periorbital bruising EXAM: CT CERVICAL SPINE WITHOUT CONTRAST TECHNIQUE: Multidetector CT imaging of the cervical spine was performed without intravenous contrast. Multiplanar CT image reconstructions were also generated. COMPARISON:  03/12/2018 FINDINGS: Alignment: Alignment is anatomic. Skull base and vertebrae: No  acute fracture. No primary bone lesion or focal pathologic process. Soft tissues and spinal canal: No prevertebral fluid or swelling. No visible canal hematoma. Disc levels: Stable spondylosis at C3-4 and C4-5. There is partial bony fusion across the disc space and facet joints at the C4-5 level. Mild symmetrical neural foraminal encroachment at C3-4, with left predominant narrowing at C4-5,  stable since prior study. Upper chest: Airway is patent.  Lung apices are clear. Other: Reconstructed images demonstrate no additional findings. IMPRESSION: 1. Stable cervical spondylosis and facet hypertrophy. 2. No acute cervical spine fracture. Electronically Signed   By: Randa Ngo M.D.   On: 01/31/2020 17:42    ____________________________________________    PROCEDURES  Procedure(s) performed:    Procedures    Medications - No data to display   ____________________________________________   INITIAL IMPRESSION / ASSESSMENT AND PLAN / ED COURSE  Pertinent labs & imaging results that were available during my care of the patient were reviewed by me and considered in my medical decision making (see chart for details).  Review of the [place] CSRS was performed in accordance of the [place] prior to dispensing any controlled drugs.           Patient's diagnosis is consistent with fall, facial contusion with minor head injury.  Patient presents emergency department after a mechanical fall at home.  No loss of consciousness.  She sustained a very superficial abrasion and ecchymosis to the face.  Findings are consistent with facial contusion without underlying evidence of fracture.  No intracranial hemorrhage on imaging.  Patient was neurologically intact.  Stable for discharge at this time..  Wound care instructions regarding abrasion are discussed with the patient.  Follow-up with primary care as needed.  Patient is given ED precautions to return to the ED for any worsening or new  symptoms.     ____________________________________________  FINAL CLINICAL IMPRESSION(S) / ED DIAGNOSES  Final diagnoses:  Fall, initial encounter  Minor head injury, initial encounter  Contusion of face, initial encounter      NEW MEDICATIONS STARTED DURING THIS VISIT:  ED Discharge Orders    None          This chart was dictated using voice recognition software/Dragon. Despite best efforts to proofread, errors can occur which can change the meaning. Any change was purely unintentional.    Darletta Moll, PA-C 01/31/20 1956    Nena Polio, MD 01/31/20 [id number]

## 2020-01-31 NOTE — ED Triage Notes (Addendum)
First RN note: pt to ED via [facility] from [facility], per EMS pt had mechanical fall after tripping over rug and hitting her face. EMS reports c/o facial pain and L arm pain.    209/81 65HR 99%

## 2020-01-31 NOTE — ED Triage Notes (Signed)
Pt comes into the ED via [facility] from [facility] where she tripped over a box and fell.  Pt hit her head on the carpet and presents with bruising to the right eye and an abrasion to the forehead. Pt denies any LOC or blood thinners.  Pt is neurologically intact at this time and answering all questions appropriately.  Pt also c/o neck pain.

## 2020-02-05 ENCOUNTER — Emergency Department: Payer: PPO

## 2020-02-05 ENCOUNTER — Emergency Department
Admission: EM | Admit: 2020-02-05 | Discharge: 2020-02-05 | Disposition: A | Discharge status: Home / Self Care | Payer: PPO | Attending: Student in an Organized Health Care Education/Training Program | Admitting: Student in an Organized Health Care Education/Training Program

## 2020-02-05 ENCOUNTER — Other Ambulatory Visit: Payer: Self-pay

## 2020-02-05 DIAGNOSIS — E871 Hypo-osmolality and hyponatremia: Secondary | ICD-10-CM | POA: Insufficient documentation

## 2020-02-05 DIAGNOSIS — R42 Dizziness and giddiness: Secondary | ICD-10-CM | POA: Diagnosis not present

## 2020-02-05 DIAGNOSIS — Z853 Personal history of malignant neoplasm of breast: Secondary | ICD-10-CM | POA: Diagnosis not present

## 2020-02-05 DIAGNOSIS — I10 Essential (primary) hypertension: Secondary | ICD-10-CM | POA: Diagnosis not present

## 2020-02-05 DIAGNOSIS — Z79899 Other long term (current) drug therapy: Secondary | ICD-10-CM | POA: Insufficient documentation

## 2020-02-05 DIAGNOSIS — R519 Headache, unspecified: Secondary | ICD-10-CM

## 2020-02-05 DIAGNOSIS — I509 Heart failure, unspecified: Secondary | ICD-10-CM | POA: Diagnosis not present

## 2020-02-05 DIAGNOSIS — R531 Weakness: Secondary | ICD-10-CM | POA: Diagnosis not present

## 2020-02-05 DIAGNOSIS — G4489 Other headache syndrome: Secondary | ICD-10-CM | POA: Diagnosis not present

## 2020-02-05 LAB — CBC
HCT: 33.5 % — ABNORMAL LOW (ref 36.0–46.0)
Hemoglobin: 11.5 g/dL — ABNORMAL LOW (ref 12.0–15.0)
MCH: 31.9 pg (ref 26.0–34.0)
MCHC: 34.3 g/dL (ref 30.0–36.0)
MCV: 92.8 fL (ref 80.0–100.0)
Platelets: 307 10*3/uL (ref 150–400)
RBC: 3.61 MIL/uL — ABNORMAL LOW (ref 3.87–5.11)
RDW: 13.2 % (ref 11.5–15.5)
WBC: 5.9 10*3/uL (ref 4.0–10.5)
nRBC: 0 % (ref 0.0–0.2)

## 2020-02-05 LAB — URINALYSIS, COMPLETE (UACMP) WITH MICROSCOPIC
Bacteria, UA: NONE SEEN
Bilirubin Urine: NEGATIVE
Glucose, UA: NEGATIVE mg/dL
Hgb urine dipstick: NEGATIVE
Ketones, ur: NEGATIVE mg/dL
Leukocytes,Ua: NEGATIVE
Nitrite: NEGATIVE
Protein, ur: NEGATIVE mg/dL
Specific Gravity, Urine: 1.005 (ref 1.005–1.030)
Squamous Epithelial / HPF: NONE SEEN (ref 0–5)
WBC, UA: NONE SEEN WBC/hpf (ref 0–5)
pH: 7 (ref 5.0–8.0)

## 2020-02-05 LAB — BASIC METABOLIC PANEL
Anion gap: 7 (ref 5–15)
BUN: 32 mg/dL — ABNORMAL HIGH (ref 8–23)
CO2: 26 mmol/L (ref 22–32)
Calcium: 9.4 mg/dL (ref 8.9–10.3)
Chloride: 95 mmol/L — ABNORMAL LOW (ref 98–111)
Creatinine, Ser: 0.84 mg/dL (ref 0.44–1.00)
GFR, Estimated: >60 mL/min (ref >60)
Glucose, Bld: 106 mg/dL — ABNORMAL HIGH (ref 70–99)
Potassium: 4.9 mmol/L (ref 3.5–5.1)
Sodium: 128 mmol/L — ABNORMAL LOW (ref 135–145)

## 2020-02-05 MED ORDER — ACETAMINOPHEN 500 MG PO TABS
1000.0000 mg | ORAL_TABLET | Freq: Once | ORAL | Status: AC
  Administered 2020-02-05: 1000 mg via ORAL
  Filled 2020-02-05: qty 2

## 2020-02-05 MED ORDER — SODIUM CHLORIDE 0.9 % IV BOLUS
500.0000 mL | Freq: Once | INTRAVENOUS | Status: AC
  Administered 2020-02-05: 500 mL via INTRAVENOUS

## 2020-02-05 NOTE — ED Triage Notes (Signed)
Pt comes via EMS from [facility] with c/o headache. Pt had fall week ago. Pt states she is not feeling right. Pt was seen here in 12/30 for fall.

## 2020-02-05 NOTE — ED Triage Notes (Signed)
Pt to ED Ems from [facility] for chief complaint of headache, back pain, and dizziness. Recent fall one week ago Denies blood thinner use  Pt alert and oriented x4. Clear speech  Verbal order head CT Dr Erma Heritage

## 2020-02-05 NOTE — Discharge Instructions (Addendum)

## 2020-02-05 NOTE — ED Notes (Signed)
Pt called [facility] to come pick her up. Pushed pt to the lobby to wait for security there.

## 2020-02-05 NOTE — ED Provider Notes (Signed)
[facility] Emergency Department Provider Note    Event Date/Time   First MD Initiated Contact with Patient 02/05/20 1400     (approximate)  I have reviewed the triage vital signs and the nursing notes.   HISTORY  Chief Complaint Headache and Dizziness    HPI Courtney Lynn is a 80 y.o. female below listed past medical history presents to the ER for evaluation of frontal headache has been ongoing for several days.  Had recent fall.  Not on Blood thinners.  Was evaluated after this fall had CT imaging that was reassuring.  Came back because she was feeling with persistent headache and some lightheadedness.  Denies any numbness or tingling.  No visual changes.  No congestion.  No shortness of breath or chest pain.    Past Medical History:  Diagnosis Date  . Anemia   . Arthritis   . Body mass index (bmi) 37.0-37.9, adult   . Breast cancer ([place]) 01/02/2016   right breast/radiation  . Cancer ([place]) 2017   BREAST  . Chronic insomnia   . Complication of anesthesia    has difficulty last surgery intubating  . Depression   . Difficult intubation    WITH TKR  . Dizziness   . Dysrhythmia    PVC'S,BRADYCARDIA  . GERD (gastroesophageal reflux disease)   . History of chicken pox   . Hypertension   . Hyponatremia   . Intermittent vertigo   . Irregular heart beat   . Memory difficulties   . Mild mitral regurgitation   . 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[place])   . MVP (mitral valve prolapse)   . Orthopnea   . Personal history of radiation therapy 2017  . Pre-diabetes   . PVC's (premature ventricular contractions)   . Reflux   . Sleep apnea    CPAP   Family History  Problem Relation Age of Onset  . GI Bleed Mother   . Diabetes Father   . Congestive Heart Failure Father   . Breast cancer Neg Hx    Past Surgical History:  Procedure Laterality Date  . APPENDECTOMY    . BREAST BIOPSY Right 01/02/2016   PARTIAL MASTECTOMY- IMC and DCIS  . BREAST  LUMPECTOMY Right 2017  . CATARACT EXTRACTION W/PHACO Right 09/14/2016   Procedure: CATARACT EXTRACTION PHACO AND INTRAOCULAR LENS PLACEMENT (IOC);  Surgeon: Birder Robson, MD;  Location: [facility] ORS;  Service: Ophthalmology;  Laterality: Right;  [place] 00:32 AP% 19.5 CDE 6.28 Fluid pack lot # [id number]  . CATARACT EXTRACTION W/PHACO Left 10/05/2016   Procedure: CATARACT EXTRACTION PHACO AND INTRAOCULAR LENS PLACEMENT (IOC);  Surgeon: Birder Robson, MD;  Location: [facility] ORS;  Service: Ophthalmology;  Laterality: Left;  [place] 00:46.9 AP% 16.0 CDE 7.48 Fluid Pack lot # [id number]  . CHOLECYSTECTOMY    . COLONOSCOPY WITH PROPOFOL N/A 08/24/2016   Procedure: COLONOSCOPY WITH PROPOFOL;  Surgeon: Lollie Sails, MD;  Location: [facility] ENDOSCOPY;  Service: Endoscopy;  Laterality: N/A;  . COLONOSCOPY WITH PROPOFOL N/A 07/12/2018   Procedure: COLONOSCOPY WITH PROPOFOL;  Surgeon: Toledo, Benay Pike, MD;  Location: [facility] ENDOSCOPY;  Service: Gastroenterology;  Laterality: N/A;  . dental implants    . FOOT SURGERY Right   . FRACTURE SURGERY     KNEE CAP  . HEMORRHOID SURGERY N/A 04/19/2019   Procedure: HEMORRHOIDECTOMY;  Surgeon: Benjamine Sprague, DO;  Location: [facility] ORS;  Service: General;  Laterality: N/A;  . JOINT REPLACEMENT Right 2011   KNEE  . PARTIAL MASTECTOMY WITH NEEDLE  LOCALIZATION Right 01/02/2016   Procedure: PARTIAL MASTECTOMY WITH NEEDLE LOCALIZATION;  Surgeon: Leonie Green, MD;  Location: [facility] ORS;  Service: General;  Laterality: Right;  . PATELLA FRACTURE SURGERY    . REPLACEMENT TOTAL KNEE Right   . SENTINEL NODE BIOPSY Right 01/02/2016   Procedure: SENTINEL NODE BIOPSY;  Surgeon: Leonie Green, MD;  Location: [facility] ORS;  Service: General;  Laterality: Right;  . TONSILLECTOMY     Patient Active Problem List   Diagnosis Date Noted  . Major depressive disorder, recurrent, in remission ([place]) 10/17/2018  . Loss of memory 03/14/2018  . Poorly-controlled hypertension 01/05/2018  .  Hemorrhoids 12/15/2017  . 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  . Postmenopausal osteoporosis 11/23/2017  . Lumbar radiculopathy 07/14/2017  . Osteopenia of the elderly 05/24/2017  . Dizziness 12/13/2015  . Intermittent vertigo 12/13/2015  . 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 ([place]) 12/07/2015  . Fracture of left foot 10/26/2015  . OSA on CPAP 04/14/2015  . Nonrheumatic mitral valve insufficiency 09/10/2014  . 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[place]) 09/10/2014  . Nonrheumatic mitral valve prolapse 09/10/2014  . Premature ventricular contractions 09/10/2014  . Other secondary pulmonary hypertension ([place]) 09/10/2014  . Ventricular premature depolarization 09/10/2014  . 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 Hypertension 07/01/2014  . Hyponatremia 07/01/2014  . Hyposmolality and/or hyponatremia 07/01/2014  . Chronic anemia 04/23/2014  . Anemia 04/23/2014  . Body mass index (BMI) of 37.0-37.9 in adult 03/28/2014  . Chronic insomnia 03/28/2014  . Depressive disorder 03/28/2014  . Essential hypertension 03/28/2014  . Gastroesophageal reflux disease without esophagitis 03/28/2014  . Pre-diabetes 03/28/2014  . 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 Insomnia 03/28/2014  . Depression, major, single episode, complete remission ([place]) 03/28/2014  . Other obesity 03/28/2014  . Acute bronchitis, viral 01/30/2014      Prior to Admission medications   Medication Sig Start Date End Date Taking? Authorizing Provider  acetaminophen (TYLENOL) 500 MG tablet Take 1,000 mg by mouth at bedtime.    [provider]  ANUCORT-HC 25 MG suppository Place 25 mg rectally 2 (two) times daily. 10/16/19   [provider]  benzonatate (TESSALON) 100 MG capsule Take by mouth.    [provider]  citalopram (CELEXA) 10 MG tablet Take 10 mg by mouth every other day. In the evening    [provider]  docusate sodium (COLACE) 100 MG capsule Take 100 mg by mouth daily.      [provider]  donepezil (ARICEPT) 10 MG tablet Take by mouth. 10/17/19   [provider]  felodipine (PLENDIL) 5 MG 24 hr tablet Take by mouth. 09/04/19 03/02/20  [provider]  gabapentin (NEURONTIN) 100 MG capsule Take 100 mg by mouth at bedtime. 11/05/19   [provider]  hydrALAZINE (APRESOLINE) 50 MG tablet Take 50 mg by mouth 2 (two) times daily. 03/21/19   [provider]  iron polysaccharides (NIFEREX) 150 MG capsule Take by mouth. 10/22/19   [provider]  Lactase (LACTAID PO) Take by mouth.    [provider]  meloxicam (MOBIC) 15 MG tablet TAKE 1 TABLET(15 MG) BY MOUTH EVERY DAY 02/22/19   [provider]  methocarbamol (ROBAXIN) 500 MG tablet TAKE 1/2 TO 1 TABLET BY MOUTH EVERY NIGHT AT BEDTIME AS NEEDED 03/15/19   [provider]  metoprolol succinate (TOPROL-XL) 25 MG 24 hr tablet Take 25 mg by mouth daily. 01/01/19   [provider]  Multiple Vitamin (MULTIVITAMIN) tablet Take 1 tablet  by mouth daily. Osteo matrix 1 a day, omega 10 1 a day    [provider]  Multiple Vitamins-Minerals (MEMORY VITE PO) Take 1 capsule by mouth 2 (two) times daily.    [provider]  NON FORMULARY Take 1 capsule by mouth at bedtime. OmegaGuard (active ingredients) Fish oil 1200 mg-Omega 3 667 mg-EPA 363 MG-DHA 240 MG    [provider]  NONFORMULARY OR COMPOUNDED ITEM Take 1-2 capsules by mouth 3 (three) times daily. LunaRich X (Lunasin-soy peptide) 2 capsule in the morning, 2 capsules mid-day, and 1 capsule at bedtime.    [provider]  Nutritional Supplements (NUTRITIONAL SUPPLEMENT PO) Take 1 capsule by mouth daily. Protandim Nrf2 Synergizer Milk thistle (Silybum marianum) extract (225 mg) Bacopa (Bacopa monnieri) extract (150 mg) Ashwagandha (Withania somnifera) root (150 mg) Green tea (Camellia sinensis) extract (75 mg) Turmeric (Curcuma longa) extract (75 mg)     [provider]  olmesartan (BENICAR) 40 MG tablet Take 40 mg by mouth daily.  03/25/18   [provider]  omeprazole (PRILOSEC) 20 MG capsule TAKE 1 CAPSULE BY MOUTH EVERY DAY 08/19/16   [provider]  OVER THE COUNTER MEDICATION Take 1 capsule by mouth at bedtime. Joint Health Complex  (active ingredients) 60 mg Vitamin C-1.5 mg Zinc-0.2 mg Copper-0.2 mg Manganese-1500 mg Glucosamine-100 mg Boswellia    [provider]  predniSONE (DELTASONE) 10 MG tablet Take 10 mg by mouth 2 (two) times daily. 08/16/19   [provider]  Probiotic Product (PROBIOTIC PO) Take 1 capsule by mouth daily.    [provider]  traMADol (ULTRAM) 50 MG tablet Take 50 mg by mouth daily as needed.  03/14/19   [provider]    Allergies Cleocin [clindamycin hcl] and Pen vk [penicillin v]    Social History Social History   Tobacco Use  . Smoking status: Former Research scientist (life sciences)  . Smokeless tobacco: Never Used  . Tobacco comment: Quit 1969  Vaping Use  . Vaping Use: Never used  Substance Use Topics  . Alcohol use: Yes    Alcohol/week: 7.0 standard drinks    Types: 7 Glasses of wine per week    Comment: OCCASIONALLY  . Drug use: No    Review of Systems Patient denies headaches, rhinorrhea, blurry vision, numbness, shortness of breath, chest pain, edema, cough, abdominal pain, nausea, vomiting, diarrhea, dysuria, fevers, rashes or hallucinations unless otherwise stated above in HPI. ____________________________________________   PHYSICAL EXAM:  VITAL SIGNS: Vitals:   02/05/20 1210  BP: (!) 156/57  Pulse: (!) 55  Resp: 18  Temp: 98.2 F (36.8 C)  SpO2: 98%    Constitutional: Alert and oriented.  Eyes: Conjunctivae are normal. No injection Head: Atraumatic.  Temporal arteries nontender Nose: No congestion/rhinnorhea. Mouth/Throat: Mucous membranes are moist.   Neck: No stridor. Painless ROM.  Cardiovascular: Normal rate, regular rhythm.  Grossly normal heart sounds.  Good peripheral circulation. Respiratory: Normal respiratory effort.  No retractions. Lungs CTAB. Gastrointestinal: Soft and nontender. No distention. No abdominal bruits. No CVA tenderness. Genitourinary:  Musculoskeletal: No lower extremity tenderness nor edema.  No joint effusions. Neurologic:  CN- intact.  No facial droop, Normal FNF.  Normal heel to shin.  Sensation intact bilaterally. Normal speech and language. No gross focal neurologic deficits are appreciated. . Skin:  Skin is warm, dry and intact. No rash noted. Psychiatric: Mood and affect are normal. Speech and behavior are normal.  ____________________________________________   LABS (all labs ordered are listed, but only  abnormal results are displayed)  Results for orders placed or performed during the hospital encounter of 02/05/20 (from the past 24 hour(s))  Basic metabolic panel     Status: Abnormal   Collection Time: 02/05/20 12:09 PM  Result Value Ref Range   Sodium 128 (L) 135 - 145 mmol/L   Potassium 4.9 3.5 - 5.1 mmol/L   Chloride 95 (L) 98 - 111 mmol/L   CO2 26 22 - 32 mmol/L   Glucose, Bld 106 (H) 70 - 99 mg/dL   BUN 32 (H) 8 - 23 mg/dL   Creatinine, Ser 6.22 0.44 - 1.00 mg/dL   Calcium 9.4 8.9 - 29.7 mg/dL   GFR, Estimated >98 >92 mL/min   Anion gap 7 5 - 15  CBC     Status: Abnormal   Collection Time: 02/05/20 12:09 PM  Result Value Ref Range   WBC 5.9 4.0 - 10.5 K/uL   RBC 3.61 (L) 3.87 - 5.11 MIL/uL   Hemoglobin 11.5 (L) 12.0 - 15.0 g/dL   HCT 11.9 (L) 41.7 - 40.8 %   MCV 92.8 80.0 - 100.0 fL   MCH 31.9 26.0 - 34.0 pg   MCHC 34.3 30.0 - 36.0 g/dL   RDW 14.4 81.8 - 56.3 %   Platelets 307 150 - 400 K/uL   nRBC 0.0 0.0 - 0.2 %  Urinalysis, Complete w Microscopic     Status: Abnormal   Collection Time: 02/05/20 12:09 PM  Result Value Ref Range   Color, Urine STRAW (A) YELLOW   APPearance CLEAR (A) CLEAR   Specific Gravity, Urine 1.005 1.005 - 1.030   pH 7.0 5.0 - 8.0    Glucose, UA NEGATIVE NEGATIVE mg/dL   Hgb urine dipstick NEGATIVE NEGATIVE   Bilirubin Urine NEGATIVE NEGATIVE   Ketones, ur NEGATIVE NEGATIVE mg/dL   Protein, ur NEGATIVE NEGATIVE mg/dL   Nitrite NEGATIVE NEGATIVE   Leukocytes,Ua NEGATIVE NEGATIVE   WBC, UA NONE SEEN 0 - 5 WBC/hpf   Bacteria, UA NONE SEEN NONE SEEN   Squamous Epithelial / LPF NONE SEEN 0 - 5   ____________________________________________  _______________________________  RADIOLOGY  I personally reviewed all radiographic images ordered to evaluate for the above acute complaints and reviewed radiology reports and findings.  These findings were personally discussed with the patient.  Please see medical record for radiology report.  ____________________________________________   PROCEDURES  Procedure(s) performed:  Procedures    Critical Care performed: no ____________________________________________   INITIAL IMPRESSION / ASSESSMENT AND PLAN / ED COURSE  Pertinent labs & imaging results that were available during my care of the patient were reviewed by me and considered in my medical decision making (see chart for details).   DDX: Dehydration, electrolyte abnormality, SDH, IPH, mass, tension, cluster, migraine  Courtney Lynn is a 80 y.o. who presents to the ED with presentation as described above.  Patient well-appearing nontoxic with reassuring physical exam.  Imaging is reassuring.  Blood work shows mild hyponatremia down from her baseline around 130.  Patient given IV fluids to correct this.  She is not showing any acute abnormalities related to hyponatremia may have some component of her dizziness related to this.  No cough or congestion.  This is not consistent with infectious process.  Does not appear consistent with GCA or glaucoma.  No visual complaints.  Possible minor concussive sx s/ recent fall?  Not c/w sah.  As she is otherwise well-appearing will be appropriate for outpatient follow-up.  The patient was evaluated in Emergency Department today for the symptoms described in the history of present illness. He/she was evaluated in the context of the global COVID-19 pandemic, which necessitated consideration that the patient might be at risk for infection with the SARS-CoV-2 virus that causes COVID-19. Institutional protocols and algorithms that pertain to the evaluation of patients at risk for COVID-19 are in a state of rapid change based on information released by regulatory bodies including the CDC and federal and state organizations. These policies and algorithms were followed during the patient's care in the ED.  As part of my medical decision making, I reviewed the following data within the [facility] notes reviewed and incorporated, Labs reviewed, notes from prior ED visits and [place] Controlled Substance Database   ____________________________________________   FINAL CLINICAL IMPRESSION(S) / ED DIAGNOSES  Final diagnoses:  Nonintractable headache, unspecified chronicity pattern, unspecified headache type  Dizziness  Low sodium levels      NEW MEDICATIONS STARTED DURING THIS VISIT:  New Prescriptions   No medications on file     Note:  This document was prepared using Dragon voice recognition software and may include unintentional dictation errors.    Merlyn Lot, MD 02/05/20 [phone]

## 2020-02-11 DIAGNOSIS — D649 Anemia, unspecified: Secondary | ICD-10-CM | POA: Diagnosis not present

## 2020-02-11 DIAGNOSIS — M5416 Radiculopathy, lumbar region: Secondary | ICD-10-CM | POA: Diagnosis not present

## 2020-02-11 DIAGNOSIS — G4733 Obstructive sleep apnea (adult) (pediatric): Secondary | ICD-10-CM | POA: Diagnosis not present

## 2020-02-11 DIAGNOSIS — Z9989 Dependence on other enabling machines and devices: Secondary | ICD-10-CM | POA: Diagnosis not present

## 2020-02-11 DIAGNOSIS — F325 Major depressive disorder, single episode, in full remission: Secondary | ICD-10-CM | POA: Diagnosis not present

## 2020-02-11 DIAGNOSIS — I10 Essential (primary) hypertension: Secondary | ICD-10-CM | POA: Diagnosis not present

## 2020-02-11 DIAGNOSIS — E871 Hypo-osmolality and hyponatremia: Secondary | ICD-10-CM | POA: Diagnosis not present

## 2020-02-11 DIAGNOSIS — R413 Other amnesia: Secondary | ICD-10-CM | POA: Diagnosis not present

## 2020-02-11 DIAGNOSIS — M62830 Muscle spasm of back: Secondary | ICD-10-CM | POA: Diagnosis not present

## 2020-02-11 DIAGNOSIS — M5136 Other intervertebral disc degeneration, lumbar region: Secondary | ICD-10-CM | POA: Diagnosis not present

## 2020-02-25 DIAGNOSIS — Z9989 Dependence on other enabling machines and devices: Secondary | ICD-10-CM | POA: Diagnosis not present

## 2020-02-25 DIAGNOSIS — D649 Anemia, unspecified: Secondary | ICD-10-CM | POA: Diagnosis not present

## 2020-02-25 DIAGNOSIS — M170 Bilateral primary osteoarthritis of knee: Secondary | ICD-10-CM | POA: Diagnosis not present

## 2020-02-25 DIAGNOSIS — F3340 Major depressive disorder, recurrent, in remission, unspecified: Secondary | ICD-10-CM | POA: Diagnosis not present

## 2020-02-25 DIAGNOSIS — Z6835 Body mass index (BMI) 35.0-35.9, adult: Secondary | ICD-10-CM | POA: Diagnosis not present

## 2020-02-25 DIAGNOSIS — G4733 Obstructive sleep apnea (adult) (pediatric): Secondary | ICD-10-CM | POA: Diagnosis not present

## 2020-02-25 DIAGNOSIS — M79604 Pain in right leg: Secondary | ICD-10-CM | POA: Diagnosis not present

## 2020-02-25 DIAGNOSIS — R413 Other amnesia: Secondary | ICD-10-CM | POA: Diagnosis not present

## 2020-02-25 DIAGNOSIS — I10 Essential (primary) hypertension: Secondary | ICD-10-CM | POA: Diagnosis not present

## 2020-03-26 DIAGNOSIS — G4733 Obstructive sleep apnea (adult) (pediatric): Secondary | ICD-10-CM | POA: Diagnosis not present

## 2020-03-31 ENCOUNTER — Ambulatory Visit: Payer: PPO | Admitting: Podiatry

## 2020-03-31 ENCOUNTER — Encounter: Payer: Self-pay | Admitting: Podiatry

## 2020-03-31 ENCOUNTER — Other Ambulatory Visit: Payer: Self-pay

## 2020-03-31 DIAGNOSIS — M2040 Other hammer toe(s) (acquired), unspecified foot: Secondary | ICD-10-CM

## 2020-03-31 DIAGNOSIS — M2010 Hallux valgus (acquired), unspecified foot: Secondary | ICD-10-CM

## 2020-03-31 DIAGNOSIS — M79674 Pain in right toe(s): Secondary | ICD-10-CM | POA: Diagnosis not present

## 2020-03-31 DIAGNOSIS — B351 Tinea unguium: Secondary | ICD-10-CM | POA: Diagnosis not present

## 2020-03-31 DIAGNOSIS — M79675 Pain in left toe(s): Secondary | ICD-10-CM | POA: Diagnosis not present

## 2020-03-31 NOTE — Progress Notes (Signed)
This patient returns to the office for evaluation and treatment of long thick painful nails .  This patient is unable to trim her own nails since the patient cannot reach her feet.  Patient says the nails are painful walking and wearing his shoes.  She returns for preventive foot care services.  General Appearance  Alert, conversant and in no acute stress.  Vascular  Dorsalis pedis and posterior tibial  pulses are palpable  bilaterally.  Capillary return is within normal limits  bilaterally. Temperature is within normal limits  bilaterally.  Neurologic  Senn-Weinstein monofilament wire test within normal limits  bilaterally. Muscle power within normal limits bilaterally.  Nails Thick disfigured discolored nails with subungual debris  from hallux to fifth toes bilaterally. No evidence of bacterial infection or drainage bilaterally.  Orthopedic  No limitations of motion  feet .  No crepitus or effusions noted.  HAV with contracted digits  B/L.  Skin  normotropic skin with no porokeratosis noted bilaterally.  No signs of infections or ulcers noted.     Onychomycosis  Pain in toes right foot  Pain in toes left foot  Debridement  of nails  1-5  B/L with a nail nipper.  Nails were then filed using a dremel tool with no incidents. Patient third toenail right was very sensitive.   RTC 10 weeks    Gardiner Barefoot DPM

## 2020-05-12 DIAGNOSIS — M5126 Other intervertebral disc displacement, lumbar region: Secondary | ICD-10-CM | POA: Diagnosis not present

## 2020-05-12 DIAGNOSIS — M5416 Radiculopathy, lumbar region: Secondary | ICD-10-CM | POA: Diagnosis not present

## 2020-05-12 DIAGNOSIS — M5136 Other intervertebral disc degeneration, lumbar region: Secondary | ICD-10-CM | POA: Diagnosis not present

## 2020-06-09 ENCOUNTER — Encounter: Payer: Self-pay | Admitting: Podiatry

## 2020-06-09 ENCOUNTER — Other Ambulatory Visit: Payer: Self-pay

## 2020-06-09 ENCOUNTER — Ambulatory Visit: Payer: PPO | Admitting: Podiatry

## 2020-06-09 DIAGNOSIS — M79675 Pain in left toe(s): Secondary | ICD-10-CM | POA: Diagnosis not present

## 2020-06-09 DIAGNOSIS — B351 Tinea unguium: Secondary | ICD-10-CM | POA: Diagnosis not present

## 2020-06-09 DIAGNOSIS — M79674 Pain in right toe(s): Secondary | ICD-10-CM

## 2020-06-09 NOTE — Progress Notes (Signed)
She presents today for follow-up of her painful elongated toenails normally she sees Dr. Sharyon Cable for this.  She saw me today because he was not here this afternoon.  Objective: Toenails are long thick yellow dystrophic-like mycotic pulses are palpable.  Severe hammertoe deformities of the right foot more so than that of the left with mobic nail dystrophy on the right side than the left side.  No open lesions or wounds on the skin.  Assessment: Pain in limb secondary to onychomycosis.  Plan: Debridement of toenails 1 through 5 bilateral.

## 2020-06-25 DIAGNOSIS — M5126 Other intervertebral disc displacement, lumbar region: Secondary | ICD-10-CM | POA: Diagnosis not present

## 2020-06-25 DIAGNOSIS — M5416 Radiculopathy, lumbar region: Secondary | ICD-10-CM | POA: Diagnosis not present

## 2020-06-25 DIAGNOSIS — M5136 Other intervertebral disc degeneration, lumbar region: Secondary | ICD-10-CM | POA: Diagnosis not present

## 2020-06-25 DIAGNOSIS — M62830 Muscle spasm of back: Secondary | ICD-10-CM | POA: Diagnosis not present

## 2020-06-26 DIAGNOSIS — I10 Essential (primary) hypertension: Secondary | ICD-10-CM | POA: Diagnosis not present

## 2020-06-26 DIAGNOSIS — R7309 Other abnormal glucose: Secondary | ICD-10-CM | POA: Diagnosis not present

## 2020-06-26 DIAGNOSIS — D649 Anemia, unspecified: Secondary | ICD-10-CM | POA: Diagnosis not present

## 2020-07-13 ENCOUNTER — Emergency Department: Payer: PPO

## 2020-07-13 ENCOUNTER — Observation Stay: Payer: PPO

## 2020-07-13 ENCOUNTER — Observation Stay
Admission: EM | Admit: 2020-07-13 | Discharge: 2020-07-14 | Disposition: A | Discharge status: Home / Self Care | Payer: PPO | Attending: Emergency Medicine | Admitting: Emergency Medicine

## 2020-07-13 ENCOUNTER — Other Ambulatory Visit: Payer: Self-pay

## 2020-07-13 ENCOUNTER — Encounter: Payer: Self-pay | Admitting: Emergency Medicine

## 2020-07-13 DIAGNOSIS — I499 Cardiac arrhythmia, unspecified: Secondary | ICD-10-CM | POA: Diagnosis not present

## 2020-07-13 DIAGNOSIS — S63114A Dislocation of metacarpophalangeal joint of right thumb, initial encounter: Secondary | ICD-10-CM | POA: Diagnosis not present

## 2020-07-13 DIAGNOSIS — R55 Syncope and collapse: Secondary | ICD-10-CM | POA: Diagnosis not present

## 2020-07-13 DIAGNOSIS — F0390 Unspecified dementia without behavioral disturbance: Secondary | ICD-10-CM | POA: Diagnosis not present

## 2020-07-13 DIAGNOSIS — I6523 Occlusion and stenosis of bilateral carotid arteries: Secondary | ICD-10-CM | POA: Diagnosis not present

## 2020-07-13 DIAGNOSIS — M19011 Primary osteoarthritis, right shoulder: Secondary | ICD-10-CM | POA: Diagnosis not present

## 2020-07-13 DIAGNOSIS — Z9989 Dependence on other enabling machines and devices: Secondary | ICD-10-CM | POA: Diagnosis not present

## 2020-07-13 DIAGNOSIS — Z853 Personal history of malignant neoplasm of breast: Secondary | ICD-10-CM | POA: Diagnosis not present

## 2020-07-13 DIAGNOSIS — N179 Acute kidney failure, unspecified: Secondary | ICD-10-CM

## 2020-07-13 DIAGNOSIS — T1490XA Injury, unspecified, initial encounter: Secondary | ICD-10-CM

## 2020-07-13 DIAGNOSIS — S06300A Unspecified focal traumatic brain injury without loss of consciousness, initial encounter: Secondary | ICD-10-CM

## 2020-07-13 DIAGNOSIS — E871 Hypo-osmolality and hyponatremia: Secondary | ICD-10-CM | POA: Diagnosis not present

## 2020-07-13 DIAGNOSIS — G4733 Obstructive sleep apnea (adult) (pediatric): Secondary | ICD-10-CM

## 2020-07-13 DIAGNOSIS — Z20822 Contact with and (suspected) exposure to covid-19: Secondary | ICD-10-CM | POA: Diagnosis not present

## 2020-07-13 DIAGNOSIS — S6991XA Unspecified injury of right wrist, hand and finger(s), initial encounter: Secondary | ICD-10-CM | POA: Diagnosis present

## 2020-07-13 DIAGNOSIS — S62501B Fracture of unspecified phalanx of right thumb, initial encounter for open fracture: Principal | ICD-10-CM | POA: Insufficient documentation

## 2020-07-13 DIAGNOSIS — I959 Hypotension, unspecified: Secondary | ICD-10-CM | POA: Diagnosis not present

## 2020-07-13 DIAGNOSIS — S0990XA Unspecified injury of head, initial encounter: Secondary | ICD-10-CM | POA: Diagnosis not present

## 2020-07-13 DIAGNOSIS — Z043 Encounter for examination and observation following other accident: Secondary | ICD-10-CM | POA: Diagnosis not present

## 2020-07-13 DIAGNOSIS — Z79899 Other long term (current) drug therapy: Secondary | ICD-10-CM | POA: Insufficient documentation

## 2020-07-13 DIAGNOSIS — Z87891 Personal history of nicotine dependence: Secondary | ICD-10-CM | POA: Insufficient documentation

## 2020-07-13 DIAGNOSIS — Y9301 Activity, walking, marching and hiking: Secondary | ICD-10-CM | POA: Diagnosis not present

## 2020-07-13 DIAGNOSIS — Y900 Blood alcohol level of less than 20 mg/100 ml: Secondary | ICD-10-CM | POA: Diagnosis not present

## 2020-07-13 DIAGNOSIS — S63104A Unspecified dislocation of right thumb, initial encounter: Secondary | ICD-10-CM | POA: Diagnosis present

## 2020-07-13 DIAGNOSIS — Y92838 Other recreation area as the place of occurrence of the external cause: Secondary | ICD-10-CM | POA: Diagnosis not present

## 2020-07-13 DIAGNOSIS — I629 Nontraumatic intracranial hemorrhage, unspecified: Secondary | ICD-10-CM

## 2020-07-13 DIAGNOSIS — M47812 Spondylosis without myelopathy or radiculopathy, cervical region: Secondary | ICD-10-CM | POA: Diagnosis not present

## 2020-07-13 DIAGNOSIS — S61011A Laceration without foreign body of right thumb without damage to nail, initial encounter: Secondary | ICD-10-CM | POA: Insufficient documentation

## 2020-07-13 DIAGNOSIS — R7303 Prediabetes: Secondary | ICD-10-CM | POA: Insufficient documentation

## 2020-07-13 DIAGNOSIS — R001 Bradycardia, unspecified: Secondary | ICD-10-CM | POA: Diagnosis not present

## 2020-07-13 DIAGNOSIS — W19XXXA Unspecified fall, initial encounter: Secondary | ICD-10-CM | POA: Diagnosis not present

## 2020-07-13 DIAGNOSIS — I771 Stricture of artery: Secondary | ICD-10-CM | POA: Diagnosis not present

## 2020-07-13 DIAGNOSIS — I10 Essential (primary) hypertension: Secondary | ICD-10-CM | POA: Insufficient documentation

## 2020-07-13 DIAGNOSIS — S62201A Unspecified fracture of first metacarpal bone, right hand, initial encounter for closed fracture: Secondary | ICD-10-CM | POA: Diagnosis not present

## 2020-07-13 DIAGNOSIS — Z96651 Presence of right artificial knee joint: Secondary | ICD-10-CM | POA: Insufficient documentation

## 2020-07-13 DIAGNOSIS — S62511A Displaced fracture of proximal phalanx of right thumb, initial encounter for closed fracture: Secondary | ICD-10-CM | POA: Diagnosis not present

## 2020-07-13 DIAGNOSIS — R9082 White matter disease, unspecified: Secondary | ICD-10-CM | POA: Diagnosis not present

## 2020-07-13 DIAGNOSIS — Z23 Encounter for immunization: Secondary | ICD-10-CM | POA: Insufficient documentation

## 2020-07-13 DIAGNOSIS — G319 Degenerative disease of nervous system, unspecified: Secondary | ICD-10-CM | POA: Diagnosis not present

## 2020-07-13 DIAGNOSIS — D649 Anemia, unspecified: Secondary | ICD-10-CM | POA: Diagnosis not present

## 2020-07-13 DIAGNOSIS — T148XXA Other injury of unspecified body region, initial encounter: Secondary | ICD-10-CM

## 2020-07-13 DIAGNOSIS — M19012 Primary osteoarthritis, left shoulder: Secondary | ICD-10-CM | POA: Diagnosis not present

## 2020-07-13 LAB — CBC WITH DIFFERENTIAL/PLATELET
Abs Immature Granulocytes: 0.04 10*3/uL (ref 0.00–0.07)
Basophils Absolute: 0.1 10*3/uL (ref 0.0–0.1)
Basophils Relative: 1 %
Eosinophils Absolute: 0.3 10*3/uL (ref 0.0–0.5)
Eosinophils Relative: 3 %
HCT: 32.3 % — ABNORMAL LOW (ref 36.0–46.0)
Hemoglobin: 11.1 g/dL — ABNORMAL LOW (ref 12.0–15.0)
Immature Granulocytes: 0 %
Lymphocytes Relative: 18 %
Lymphs Abs: 1.6 10*3/uL (ref 0.7–4.0)
MCH: 32.1 pg (ref 26.0–34.0)
MCHC: 34.4 g/dL (ref 30.0–36.0)
MCV: 93.4 fL (ref 80.0–100.0)
Monocytes Absolute: 0.8 10*3/uL (ref 0.1–1.0)
Monocytes Relative: 9 %
Neutro Abs: 6.5 10*3/uL (ref 1.7–7.7)
Neutrophils Relative %: 69 %
Platelets: 300 10*3/uL (ref 150–400)
RBC: 3.46 MIL/uL — ABNORMAL LOW (ref 3.87–5.11)
RDW: 14 % (ref 11.5–15.5)
WBC: 9.3 10*3/uL (ref 4.0–10.5)
nRBC: 0 % (ref 0.0–0.2)

## 2020-07-13 LAB — COMPREHENSIVE METABOLIC PANEL
ALT: 19 U/L (ref 0–44)
AST: 23 U/L (ref 15–41)
Albumin: 3.6 g/dL (ref 3.5–5.0)
Alkaline Phosphatase: 47 U/L (ref 38–126)
Anion gap: 8 (ref 5–15)
BUN: 35 mg/dL — ABNORMAL HIGH (ref 8–23)
CO2: 20 mmol/L — ABNORMAL LOW (ref 22–32)
Calcium: 8.8 mg/dL — ABNORMAL LOW (ref 8.9–10.3)
Chloride: 102 mmol/L (ref 98–111)
Creatinine, Ser: 1.06 mg/dL — ABNORMAL HIGH (ref 0.44–1.00)
GFR, Estimated: 53 mL/min — ABNORMAL LOW (ref >60)
Glucose, Bld: 91 mg/dL (ref 70–99)
Potassium: 4.4 mmol/L (ref 3.5–5.1)
Sodium: 130 mmol/L — ABNORMAL LOW (ref 135–145)
Total Bilirubin: 0.7 mg/dL (ref 0.3–1.2)
Total Protein: 6.2 g/dL — ABNORMAL LOW (ref 6.5–8.1)

## 2020-07-13 LAB — PHOSPHORUS: Phosphorus: 3.4 mg/dL (ref 2.5–4.6)

## 2020-07-13 LAB — URINALYSIS, COMPLETE (UACMP) WITH MICROSCOPIC
Bacteria, UA: NONE SEEN
Bilirubin Urine: NEGATIVE
Glucose, UA: NEGATIVE mg/dL
Hgb urine dipstick: NEGATIVE
Ketones, ur: NEGATIVE mg/dL
Leukocytes,Ua: NEGATIVE
Nitrite: NEGATIVE
Protein, ur: NEGATIVE mg/dL
Specific Gravity, Urine: 1.015 (ref 1.005–1.030)
Squamous Epithelial / HPF: NONE SEEN (ref 0–5)
pH: 7 (ref 5.0–8.0)

## 2020-07-13 LAB — IRON AND TIBC
Iron: 72 ug/dL (ref 28–170)
Saturation Ratios: 21 % (ref 10.4–31.8)
TIBC: 347 ug/dL (ref 250–450)
UIBC: 275 ug/dL

## 2020-07-13 LAB — RESP PANEL BY RT-PCR (FLU A&B, COVID) ARPGX2
Influenza A by PCR: NEGATIVE
Influenza B by PCR: NEGATIVE
SARS Coronavirus 2 by RT PCR: NEGATIVE

## 2020-07-13 LAB — PROTIME-INR
INR: 1 (ref 0.8–1.2)
Prothrombin Time: 13.3 seconds (ref 11.4–15.2)

## 2020-07-13 LAB — RETICULOCYTES
Immature Retic Fract: 20.3 % — ABNORMAL HIGH (ref 2.3–15.9)
RBC.: 3.45 MIL/uL — ABNORMAL LOW (ref 3.87–5.11)
Retic Count, Absolute: 88.7 10*3/uL (ref 19.0–186.0)
Retic Ct Pct: 2.6 % (ref 0.4–3.1)

## 2020-07-13 LAB — SODIUM, URINE, RANDOM: Sodium, Ur: 91 mmol/L

## 2020-07-13 LAB — FERRITIN: Ferritin: 70 ng/mL (ref 11–307)

## 2020-07-13 LAB — LACTIC ACID, PLASMA: Lactic Acid, Venous: 1 mmol/L (ref 0.5–1.9)

## 2020-07-13 LAB — LIPASE, BLOOD: Lipase: 35 U/L (ref 11–51)

## 2020-07-13 LAB — CBG MONITORING, ED: Glucose-Capillary: 148 mg/dL — ABNORMAL HIGH (ref 70–99)

## 2020-07-13 LAB — OSMOLALITY: Osmolality: 282 mOsm/kg (ref 275–295)

## 2020-07-13 LAB — CK: Total CK: 176 U/L (ref 38–234)

## 2020-07-13 LAB — AMMONIA: Ammonia: 14 umol/L (ref 9–35)

## 2020-07-13 LAB — TROPONIN I (HIGH SENSITIVITY)
Troponin I (High Sensitivity): 6 ng/L (ref <18)
Troponin I (High Sensitivity): 7 ng/L (ref <18)

## 2020-07-13 LAB — MAGNESIUM: Magnesium: 1.9 mg/dL (ref 1.7–2.4)

## 2020-07-13 LAB — ETHANOL: Alcohol, Ethyl (B): <10 mg/dL (ref <10)

## 2020-07-13 LAB — CREATININE, URINE, RANDOM: Creatinine, Urine: 60 mg/dL

## 2020-07-13 LAB — OSMOLALITY, URINE: Osmolality, Ur: 552 mOsm/kg (ref 300–900)

## 2020-07-13 MED ORDER — DOXYCYCLINE HYCLATE 100 MG PO TABS
100.0000 mg | ORAL_TABLET | Freq: Two times a day (BID) | ORAL | Status: DC
  Administered 2020-07-14: 100 mg via ORAL
  Filled 2020-07-13: qty 1

## 2020-07-13 MED ORDER — SODIUM CHLORIDE 0.9 % IV BOLUS
500.0000 mL | Freq: Once | INTRAVENOUS | Status: AC
  Administered 2020-07-13: 500 mL via INTRAVENOUS

## 2020-07-13 MED ORDER — CITALOPRAM HYDROBROMIDE 20 MG PO TABS
10.0000 mg | ORAL_TABLET | ORAL | Status: DC
  Administered 2020-07-14: 10 mg via ORAL
  Filled 2020-07-13: qty 1

## 2020-07-13 MED ORDER — PANTOPRAZOLE SODIUM 40 MG PO TBEC
40.0000 mg | DELAYED_RELEASE_TABLET | Freq: Every day | ORAL | Status: DC
  Administered 2020-07-14: 40 mg via ORAL
  Filled 2020-07-13: qty 1

## 2020-07-13 MED ORDER — PANTOPRAZOLE SODIUM 40 MG PO TBEC: 40.0000 mg | DELAYED_RELEASE_TABLET | Freq: Every day | ORAL | Status: DC

## 2020-07-13 MED ORDER — HYDROCODONE-ACETAMINOPHEN 5-325 MG PO TABS: 1.0000 | ORAL_TABLET | ORAL | Status: DC | PRN

## 2020-07-13 MED ORDER — CITALOPRAM HYDROBROMIDE 20 MG PO TABS: 10.0000 mg | ORAL_TABLET | ORAL | Status: DC

## 2020-07-13 MED ORDER — SODIUM CHLORIDE 0.9 % IV SOLN
75.0000 mL/h | INTRAVENOUS | Status: DC
  Administered 2020-07-13: 75 mL/h via INTRAVENOUS

## 2020-07-13 MED ORDER — ACETAMINOPHEN 650 MG RE SUPP: 650.0000 mg | Freq: Four times a day (QID) | RECTAL | Status: DC | PRN

## 2020-07-13 MED ORDER — LIDOCAINE HCL (PF) 1 % IJ SOLN
5.0000 mL | Freq: Once | INTRAMUSCULAR | Status: AC
  Administered 2020-07-13: 5 mL via INTRADERMAL
  Filled 2020-07-13: qty 5

## 2020-07-13 MED ORDER — ACETAMINOPHEN 500 MG PO TABS
1000.0000 mg | ORAL_TABLET | Freq: Once | ORAL | Status: AC
  Administered 2020-07-13: 1000 mg via ORAL
  Filled 2020-07-13: qty 2

## 2020-07-13 MED ORDER — ACETAMINOPHEN 325 MG PO TABS: 650.0000 mg | ORAL_TABLET | Freq: Four times a day (QID) | ORAL | Status: DC | PRN

## 2020-07-13 MED ORDER — BUPIVACAINE HCL (PF) 0.5 % IJ SOLN
20.0000 mL | Freq: Once | INTRAMUSCULAR | Status: AC
  Administered 2020-07-13: 20 mL
  Filled 2020-07-13: qty 30

## 2020-07-13 MED ORDER — TETANUS-DIPHTH-ACELL PERTUSSIS 5-2.5-18.5 LF-MCG/0.5 IM SUSY
0.5000 mL | PREFILLED_SYRINGE | Freq: Once | INTRAMUSCULAR | Status: AC
  Administered 2020-07-13: 0.5 mL via INTRAMUSCULAR
  Filled 2020-07-13: qty 0.5

## 2020-07-13 MED ORDER — DONEPEZIL HCL 5 MG PO TABS
5.0000 mg | ORAL_TABLET | Freq: Every day | ORAL | Status: DC
  Administered 2020-07-13: 5 mg via ORAL
  Filled 2020-07-13: qty 1

## 2020-07-13 MED ORDER — DOXYCYCLINE HYCLATE 100 MG PO TABS
100.0000 mg | ORAL_TABLET | Freq: Once | ORAL | Status: AC
  Administered 2020-07-13: 100 mg via ORAL
  Filled 2020-07-13: qty 1

## 2020-07-13 NOTE — ED Triage Notes (Signed)
Pt arrived via [facility] from [facility] with c/o fall, R thumb pain, and a junctional rhythm bradycardic.  Per EMS, unwitnessed fall. Per EMS [place] wrapped R thumb and used word "dangling" to describe it.   Per EMS, pt admits alcohol use today.   Pt stating she is dizzy at this time.

## 2020-07-13 NOTE — ED Notes (Signed)
Patient taken to CT.

## 2020-07-13 NOTE — ED Provider Notes (Signed)
[facility] Emergency Department Provider Note ____________________________________________   Event Date/Time   First MD Initiated Contact with Patient 07/13/20 1421     (approximate)  I have reviewed the triage vital signs and the nursing notes.   HISTORY  Chief Complaint Fall  HPI Courtney Lynn is a 80 y.o. female with history as listed below presents to the emergency department for treatment and evaluation [place] She lives at [facility]. Per EMS she has had alcohol today. Patient is confused and unsure what happened. Junctional rhythm with rate in the 30s enroute. Patient complains of headache and right thumb pain. EMS report the thumb is cut and likely dislocated. Bleeding controlled with bandage.      Past Medical History:  Diagnosis Date   Anemia    Arthritis    Body mass index (bmi) 37.0-37.9, adult    Breast cancer ([place]) 01/02/2016   right breast/radiation   Cancer ([place]) 2017   BREAST   Chronic insomnia    Complication of anesthesia    has difficulty last surgery intubating   Depression    Difficult intubation    WITH TKR   Dizziness    Dysrhythmia    PVC'S,BRADYCARDIA   GERD (gastroesophageal reflux disease)    History of chicken pox    Hypertension    Hyponatremia    Intermittent vertigo    Irregular heart beat    Memory difficulties    Mild mitral regurgitation    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HCC)    MVP (mitral valve prolapse)    Orthopnea    Personal history of radiation therapy 2017   Pre-diabetes    PVC's (premature ventricular contractions)    Reflux    Sleep apnea    CPAP    Patient Active Problem List   Diagnosis Date Noted   Intracranial bleed ([place]) 07/13/2020   Dementia ([place]) 07/13/2020   Closed dislocation of right thumb 07/13/2020   AKI (acute kidney injury) ([place]) 07/13/2020   Syncope and collapse 07/13/2020   Major depressive disorder, recurrent, in remission ([place])  10/17/2018   Loss of memory 03/14/2018   Poorly-controlled hypertension 01/05/2018   Hemorrhoids 12/15/2017   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   Postmenopausal osteoporosis 11/23/2017   Lumbar radiculopathy 07/14/2017   Osteopenia of the elderly 05/24/2017   Dizziness 12/13/2015   Intermittent vertigo 12/13/2015   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 ([place]) 12/07/2015   Fracture of left foot 10/26/2015   OSA on CPAP 04/14/2015   Nonrheumatic mitral valve insufficiency 09/10/2014   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[place]) 09/10/2014   Nonrheumatic mitral valve prolapse 09/10/2014   Premature ventricular contractions 09/10/2014   Other secondary pulmonary hypertension ([place]) 09/10/2014   Ventricular premature depolarization 09/10/2014   Bradycardia 07/01/2014   Hypertension 07/01/2014   Hyponatremia 07/01/2014   Hyposmolality and/or hyponatremia 07/01/2014   Chronic anemia 04/23/2014   Anemia 04/23/2014   Body mass index (BMI) of 37.0-37.9 in adult 03/28/2014   Chronic insomnia 03/28/2014   Depressive disorder 03/28/2014   Essential hypertension 03/28/2014   Gastroesophageal reflux disease without esophagitis 03/28/2014   Pre-diabetes 03/28/2014   Primary osteoarthritis of both knees 03/28/2014   Insomnia 03/28/2014   Depression, major, single episode, complete remission ([place]) 03/28/2014   Other obesity 03/28/2014   Acute bronchitis, viral 01/30/2014    Past Surgical History:  Procedure Laterality Date   APPENDECTOMY     BREAST BIOPSY Right 01/02/2016   PARTIAL MASTECTOMY-  [facility] and DCIS   BREAST LUMPECTOMY Right 2017   CATARACT EXTRACTION W/PHACO Right 09/14/2016   Procedure: CATARACT EXTRACTION PHACO AND INTRAOCULAR LENS PLACEMENT (IOC);  Surgeon: Birder Robson, MD;  Location: [facility] ORS;  Service: Ophthalmology;  Laterality: Right;  [place] 00:32 AP% 19.5 CDE 6.28 Fluid pack lot # [id number]   CATARACT EXTRACTION W/PHACO Left 10/05/2016   Procedure: CATARACT EXTRACTION  PHACO AND INTRAOCULAR LENS PLACEMENT (IOC);  Surgeon: Birder Robson, MD;  Location: [facility] ORS;  Service: Ophthalmology;  Laterality: Left;  [place] 00:46.9 AP% 16.0 CDE 7.48 Fluid Pack lot # [id number]   CHOLECYSTECTOMY     COLONOSCOPY WITH PROPOFOL N/A 08/24/2016   Procedure: COLONOSCOPY WITH PROPOFOL;  Surgeon: Lollie Sails, MD;  Location: [facility] ENDOSCOPY;  Service: Endoscopy;  Laterality: N/A;   COLONOSCOPY WITH PROPOFOL N/A 07/12/2018   Procedure: COLONOSCOPY WITH PROPOFOL;  Surgeon: Toledo, Benay Pike, MD;  Location: [facility] ENDOSCOPY;  Service: Gastroenterology;  Laterality: N/A;   dental implants     FOOT SURGERY Right    FRACTURE SURGERY     KNEE CAP   HEMORRHOID SURGERY N/A 04/19/2019   Procedure: HEMORRHOIDECTOMY;  Surgeon: Benjamine Sprague, DO;  Location: [facility] ORS;  Service: General;  Laterality: N/A;   JOINT REPLACEMENT Right 2011   KNEE   PARTIAL MASTECTOMY WITH NEEDLE LOCALIZATION Right 01/02/2016   Procedure: PARTIAL MASTECTOMY WITH NEEDLE LOCALIZATION;  Surgeon: Leonie Green, MD;  Location: [facility] ORS;  Service: General;  Laterality: Right;   PATELLA FRACTURE SURGERY     REPLACEMENT TOTAL KNEE Right    SENTINEL NODE BIOPSY Right 01/02/2016   Procedure: SENTINEL NODE BIOPSY;  Surgeon: Leonie Green, MD;  Location: [facility] ORS;  Service: General;  Laterality: Right;   TONSILLECTOMY      Prior to Admission medications   Medication Sig Start Date End Date Taking? Authorizing Provider  acetaminophen (TYLENOL) 500 MG tablet Take 1,000 mg by mouth at bedtime.   Yes [provider]  citalopram (CELEXA) 10 MG tablet Take 10 mg by mouth every other day.   Yes [provider]  docusate sodium (COLACE) 100 MG capsule Take 100 mg by mouth daily.    Yes [provider]  donepezil (ARICEPT) 10 MG tablet Take 10 mg by mouth at bedtime.   Yes [provider]  felodipine (PLENDIL) 5 MG 24 hr tablet Take 5 mg by mouth daily.   Yes [provider]   gabapentin (NEURONTIN) 100 MG capsule Take 200 mg by mouth at bedtime.   Yes [provider]  hydrALAZINE (APRESOLINE) 50 MG tablet Take 50 mg by mouth 2 (two) times daily. 03/21/19  Yes [provider]  iron polysaccharides (NIFEREX) 150 MG capsule Take 150 mg by mouth daily.   Yes [provider]  lactase (LACTAID) 3000 units tablet Take 1 tablet by mouth daily.   Yes [provider]  meloxicam (MOBIC) 15 MG tablet Take 15 mg by mouth daily.   Yes [provider]  methocarbamol (ROBAXIN) 500 MG tablet Take 500 mg by mouth at bedtime.   Yes [provider]  metoprolol succinate (TOPROL-XL) 25 MG 24 hr tablet Take 25 mg by mouth daily.   Yes [provider]  Multiple Vitamin (MULTIVITAMIN) tablet Take 1 tablet by mouth 2 (two) times daily.   Yes [provider]  olmesartan (BENICAR) 40 MG tablet Take 40 mg by mouth daily.  03/25/18  Yes [provider]  omeprazole (PRILOSEC) 20 MG capsule Take 20 mg  by mouth daily.   Yes [provider]  OVER THE COUNTER MEDICATION Take 1 capsule by mouth daily. **Shaklee OmegaGuard**   Yes [provider]  OVER THE COUNTER MEDICATION Take 1 capsule by mouth 2 (two) times daily. **Omega Q Plus**   Yes [provider]  OVER THE COUNTER MEDICATION Take 1 tablet by mouth daily. **OptiBiotic**   Yes [provider]  OVER THE COUNTER MEDICATION Take 2 capsules by mouth daily. **Shaklee OsteoMatrix**   Yes [provider]  OVER THE COUNTER MEDICATION Take 1 capsule by mouth daily. **LifeVantage Protandim**   Yes [provider]  OVER THE COUNTER MEDICATION Take 2 tablets by mouth daily with lunch. **Shaklee Blood Pressure**   Yes [provider]  Wheat Dextrin (BENEFIBER) CHEW Chew 1 tablet by mouth daily.   Yes [provider]    Allergies Cleocin [clindamycin hcl] and Pen vk [penicillin v]  Family History  Problem  Relation Age of Onset   GI Bleed Mother    Diabetes Father    Congestive Heart Failure Father    Breast cancer Neg Hx     Social History Social History   Tobacco Use   Smoking status: Former    Pack years: 0.00   Smokeless tobacco: Never   Tobacco comments:    Quit 1969  Vaping Use   Vaping Use: Never used  Substance Use Topics   Alcohol use: Yes    Alcohol/week: 7.0 standard drinks    Types: 7 Glasses of wine per week    Comment: OCCASIONALLY   Drug use: No    Review of Systems  Level 5 Caveat: Patient confused.  ____________________________________________   PHYSICAL EXAM:  VITAL SIGNS: ED Triage Vitals [07/13/20 1427]  Enc Vitals Group     BP      Pulse      Resp      Temp      Temp src      SpO2      Weight 200 lb (90.7 kg)     Height 5\' 1"  (1.549 m)     Head Circumference      Peak Flow      Pain Score      Pain Loc      Pain Edu?      Excl. in [place]?     Constitutional: Alert and confused. No acute distress. Eyes: Conjunctivae are normal.  Head: Atraumatic. Nose: No congestion/rhinnorhea. Mouth/Throat: Mucous membranes are moist. Oropharynx non-erythematous. Neck: No stridor.   Hematological/Lymphatic/Immunilogical: No cervical lymphadenopathy. Cardiovascular: Normal rate, regular rhythm. Grossly normal heart sounds.  Good peripheral circulation. Respiratory: Normal respiratory effort.  No retractions. Lungs CTAB. Gastrointestinal: Soft and nontender. No distention. No abdominal bruits. Genitourinary:  Musculoskeletal: Open fracture right thumb with bleeding controlled. Neurologic:  Normal speech and language. No gross focal neurologic deficits are appreciated.  Skin:  Skin is warm and dry. Thumb laceration over the DIP with obvious fracture. Psychiatric: Mood is calm. Speech normal.  ____________________________________________   LABS (all labs ordered are listed, but only abnormal results are displayed)  Labs Reviewed  COMPREHENSIVE  METABOLIC PANEL - Abnormal; Notable for the following components:      Result Value   Sodium 130 (*)    CO2 20 (*)    BUN 35 (*)    Creatinine, Ser 1.06 (*)    Calcium 8.8 (*)    Total Protein 6.2 (*)    GFR, Estimated 53 (*)    All  other components within normal limits  CBC WITH DIFFERENTIAL/PLATELET - Abnormal; Notable for the following components:   RBC 3.46 (*)    Hemoglobin 11.1 (*)    HCT 32.3 (*)    All other components within normal limits  URINALYSIS, COMPLETE (UACMP) WITH MICROSCOPIC - Abnormal; Notable for the following components:   Color, Urine YELLOW (*)    APPearance CLEAR (*)    All other components within normal limits  RETICULOCYTES - Abnormal; Notable for the following components:   RBC. 3.45 (*)    Immature Retic Fract 20.3 (*)    All other components within normal limits  CBG MONITORING, ED - Abnormal; Notable for the following components:   Glucose-Capillary 148 (*)    All other components within normal limits  RESP PANEL BY RT-PCR (FLU A&B, COVID) ARPGX2  LIPASE, BLOOD  ETHANOL  PROTIME-INR  OSMOLALITY, URINE  CREATININE, URINE, RANDOM  SODIUM, URINE, RANDOM  OSMOLALITY  IRON AND TIBC  FERRITIN  AMMONIA  LACTIC ACID, PLASMA  LIPID PANEL  MAGNESIUM  PHOSPHORUS  CBC WITH DIFFERENTIAL/PLATELET  TSH  COMPREHENSIVE METABOLIC PANEL  VITAMIN U23  FOLATE  HEMOGLOBIN A1C  CK  PHOSPHORUS  MAGNESIUM  TROPONIN I (HIGH SENSITIVITY)  TROPONIN I (HIGH SENSITIVITY)   ____________________________________________  EKG  ED ECG REPORT I, Dusten Ellinwood, FNP-BC personally viewed and interpreted this ECG.   Date: 07/13/2020  EKG Time: 1438  Rate: 65  Rhythm: normal EKG, normal sinus rhythm  Axis: normal  Intervals:first-degree A-V block   ST&T Change: no ST elevation  ____________________________________________  RADIOLOGY  ED MD interpretation:    Head CT shows a trace hyperdensity in the posterior left lateral ventricle potentially  representing blood.  CT of the cervical spine is negative for acute concerns.  Image of the right thumb shows dislocation at the DIP.  Small avulsion fractures  I, Dolph Tavano, personally viewed and evaluated these images (plain radiographs) as part of my medical decision making, as well as reviewing the written report by the radiologist.  Official radiology report(s): CT HEAD WO CONTRAST  Result Date: 07/13/2020 CLINICAL DATA:  Unwitnessed fall.  Follow-up earlier head CT. EXAM: CT HEAD WITHOUT CONTRAST TECHNIQUE: Contiguous axial images were obtained from the base of the skull through the vertex without intravenous contrast. COMPARISON:  Earlier head CT, same date. FINDINGS: Brain: Stable age related cerebral atrophy, ventriculomegaly and periventricular white matter disease. Stable small amount of layering high attenuation material in the occipital horn of the left lateral ventricle likely a small amount of intraventricular hemorrhage. I do not see any other areas of intracranial hemorrhage. No hemorrhagic contusions or subarachnoid hemorrhage. No mass lesions. The brainstem and cerebellum are normal. Vascular: Vascular calcifications but no aneurysm or hyperdense vessels. Skull: No skull fracture or bone lesions. Sinuses/Orbits: The paranasal sinuses and mastoid air cells are clear. There is a polyp or mucous retention cyst in the left maxillary sinus. Other: No scalp lesions or scalp hematoma. IMPRESSION: 1. Stable small amount of layering high attenuation material in the occipital horn of the left lateral ventricle likely a small amount of intraventricular hemorrhage. 2. Stable age related cerebral atrophy, ventriculomegaly and periventricular white matter disease. Electronically Signed   By: Marijo Sanes M.D.   On: 07/13/2020 20:36   CT Head Wo Contrast  Addendum Date: 07/13/2020   ADDENDUM REPORT: 07/13/2020 16:16 ADDENDUM: These results were called by telephone at the time of interpretation  on 07/13/2020 at 4:15 pm to provider Dr. Joni Fears, who verbally acknowledged  these results. Electronically Signed   By: Valentino Saxon MD   On: 07/13/2020 16:16   Result Date: 07/13/2020 CLINICAL DATA:  Fall EXAM: CT HEAD WITHOUT CONTRAST CT CERVICAL SPINE WITHOUT CONTRAST TECHNIQUE: Multidetector CT imaging of the head and cervical spine was performed following the standard protocol without intravenous contrast. Multiplanar CT image reconstructions of the cervical spine were also generated. COMPARISON:  January 31, 2020, February 05, 2020 FINDINGS: CT HEAD FINDINGS Brain: There is trace high density material layering within the posterior horn of the LEFT lateral ventricle (series 5, image 35; series 2, image 14). This is new since prior. No evidence of acute infarction or hydrocephalus. Revisualization advanced atrophy of the RIGHT greater than LEFT temporal lobes. Periventricular white matter hypodensities consistent with sequela of chronic microvascular ischemic disease. Vascular: Vascular calcifications. Skull: Normal. Negative for fracture or focal lesion. Sinuses/Orbits: Mucous retention cyst of the LEFT maxillary sinus. Other: None. CT CERVICAL SPINE FINDINGS Alignment: Unchanged minimal anterolisthesis of C5-6 and C6-7. Skull base and vertebrae: No acute fracture. Unchanged minimal wedging of C7 vertebral body. Soft tissues and spinal canal: No prevertebral fluid or swelling. No visible canal hematoma. Disc levels: Mild degenerative changes of C3-4 and C4-5. Osseous ankylosis of the LEFT facets of C4-5. Multilevel facet arthropathy results in multilevel osseous neuroforaminal narrowing most pronounced at LEFT C4-5. Upper chest: Negative. Other: Atherosclerotic calcifications of the carotid arteries. There is sclerotic calcifications of the aorta. IMPRESSION: 1. There is a trace amount of layering hyperdensity within the posterior horn of the LEFT lateral ventricle. This could reflect a small amount of  intraventricular blood products. Recommend short-term follow-up. 2. No acute fracture or static subluxation of the cervical spine. Unchanged mild multilevel anterolisthesis, degenerative in etiology. PRA system was activated at 07/13/2020 at 4:05 pm. An addendum will be issued upon telephonic communication with ordering provider. Aortic Atherosclerosis (ICD10-I70.0). Electronically Signed: By: Valentino Saxon MD On: 07/13/2020 16:12   CT Cervical Spine Wo Contrast  Addendum Date: 07/13/2020   ADDENDUM REPORT: 07/13/2020 16:16 ADDENDUM: These results were called by telephone at the time of interpretation on 07/13/2020 at 4:15 pm to provider Dr. Joni Fears, who verbally acknowledged these results. Electronically Signed   By: Valentino Saxon MD   On: 07/13/2020 16:16   Result Date: 07/13/2020 CLINICAL DATA:  Fall EXAM: CT HEAD WITHOUT CONTRAST CT CERVICAL SPINE WITHOUT CONTRAST TECHNIQUE: Multidetector CT imaging of the head and cervical spine was performed following the standard protocol without intravenous contrast. Multiplanar CT image reconstructions of the cervical spine were also generated. COMPARISON:  January 31, 2020, February 05, 2020 FINDINGS: CT HEAD FINDINGS Brain: There is trace high density material layering within the posterior horn of the LEFT lateral ventricle (series 5, image 35; series 2, image 14). This is new since prior. No evidence of acute infarction or hydrocephalus. Revisualization advanced atrophy of the RIGHT greater than LEFT temporal lobes. Periventricular white matter hypodensities consistent with sequela of chronic microvascular ischemic disease. Vascular: Vascular calcifications. Skull: Normal. Negative for fracture or focal lesion. Sinuses/Orbits: Mucous retention cyst of the LEFT maxillary sinus. Other: None. CT CERVICAL SPINE FINDINGS Alignment: Unchanged minimal anterolisthesis of C5-6 and C6-7. Skull base and vertebrae: No acute fracture. Unchanged minimal wedging of C7  vertebral body. Soft tissues and spinal canal: No prevertebral fluid or swelling. No visible canal hematoma. Disc levels: Mild degenerative changes of C3-4 and C4-5. Osseous ankylosis of the LEFT facets of C4-5. Multilevel facet arthropathy results in multilevel osseous neuroforaminal narrowing most pronounced  at LEFT C4-5. Upper chest: Negative. Other: Atherosclerotic calcifications of the carotid arteries. There is sclerotic calcifications of the aorta. IMPRESSION: 1. There is a trace amount of layering hyperdensity within the posterior horn of the LEFT lateral ventricle. This could reflect a small amount of intraventricular blood products. Recommend short-term follow-up. 2. No acute fracture or static subluxation of the cervical spine. Unchanged mild multilevel anterolisthesis, degenerative in etiology. PRA system was activated at 07/13/2020 at 4:05 pm. An addendum will be issued upon telephonic communication with ordering provider. Aortic Atherosclerosis (ICD10-I70.0). Electronically Signed: By: Valentino Saxon MD On: 07/13/2020 16:12   DG Chest Port 1 View  Result Date: 07/13/2020 CLINICAL DATA:  Status post fall EXAM: PORTABLE CHEST 1 VIEW COMPARISON:  Chest x-ray 02/05/2020 FINDINGS: The heart size and mediastinal contours are unchanged. Aortic arch calcification. Biapical pleural/pulmonary scarring. No focal consolidation. Similar appearing coarsened interstitial markings with no overt pulmonary edema. No pleural effusion. No pneumothorax. No acute osseous abnormality. Degenerative changes of bilateral shoulders. IMPRESSION: No active disease. Electronically Signed   By: Iven Finn M.D.   On: 07/13/2020 19:42   DG Finger Thumb Right  Result Date: 07/13/2020 CLINICAL DATA:  Status post reduction of a right thumb dislocation. EXAM: RIGHT THUMB 2+V COMPARISON:  Earlier today. FINDINGS: Interval reduction of the previously demonstrated dislocation at the right 1st IP joint. Again demonstrated is a  small avulsion fracture of the base of the 1st proximal phalanx at the 1st MCP joint. There are also small avulsion fracture fragments at the radial aspect of the 1st IP joint. IMPRESSION: 1. Status post reduction of the previously seen 1st IP joint dislocation. 2. Previously described small avulsion fracture fragments at the 1st IP and 1st MCP joints. Electronically Signed   By: Claudie Revering M.D.   On: 07/13/2020 17:50   DG Finger Thumb Right  Result Date: 07/13/2020 CLINICAL DATA:  [place].  Injured right thumb. EXAM: RIGHT THUMB 2+V COMPARISON:  None. FINDINGS: Dorsal and radial dislocation of the distal phalanx of the thumb. Associated small avulsion type fractures are noted. There is also a avulsion fracture involving the radial aspect of the proximal phalanx at the MCP joint. IMPRESSION: 1. Dislocation of the distal phalanx of the thumb with small avulsion type fractures. 2. Avulsion fracture involving the radial aspect of the proximal phalanx at the MCP joint. Electronically Signed   By: Marijo Sanes M.D.   On: 07/13/2020 16:18    ____________________________________________   PROCEDURES  Procedure(s) performed (including Critical Care):  .Ortho Injury Treatment  Date/Time: 07/13/2020 6:43 PM Performed by: Victorino Dike, FNP Authorized by: Victorino Dike, FNP   Consent:    Consent obtained:  Emergent situationInjury location: hand Location details: right hand Injury type: fracture Fracture type: first metacarpal Pre-procedure distal perfusion: normal Pre-procedure neurological function: normal Pre-procedure range of motion: reduced Manipulation performed: yes Reduction successful: yes X-ray confirmed reduction: yes Immobilization: splint Splint type: static finger Splint Applied by: ED Nurse and ED Provider Supplies used: aluminum splint Post-procedure distal perfusion: normal Post-procedure neurological function: normal Post-procedure range of motion:  improved   .Marland KitchenLaceration Repair  Date/Time: 07/13/2020 6:44 PM Performed by: Victorino Dike, FNP Authorized by: Victorino Dike, FNP   Consent:    Consent obtained:  Emergent situation Anesthesia:    Anesthesia method:  Nerve block   Block location:  Digital   Block needle gauge:  25 G   Block anesthetic:  Bupivacaine 0.5% w/o epi and lidocaine 1% w/o epi  Block injection procedure:  Anatomic landmarks identified   Block outcome:  Anesthesia achieved Laceration details:    Location:  Finger   Finger location:  R thumb   Length (cm):  2.5 Pre-procedure details:    Preparation:  Patient was prepped and draped in usual sterile fashion and imaging obtained to evaluate for foreign bodies Treatment:    Area cleansed with:  Saline   Amount of cleaning:  Extensive   Irrigation solution:  Sterile saline   Irrigation volume:  500   Irrigation method:  Syringe   Debridement:  None Skin repair:    Repair method:  Sutures   Suture size:  4-0   Suture material:  Nylon   Suture technique:  Simple interrupted   Number of sutures:  5 Approximation:    Approximation:  Close Repair type:    Repair type:  Simple Post-procedure details:    Dressing:  Non-adherent dressing and splint for protection   Procedure completion:  Tolerated well, no immediate complications  ____________________________________________   INITIAL IMPRESSION / ASSESSMENT AND PLAN     80 year old female presenting to the emergency department for treatment and evaluation of an unwitnessed fall at her nursing facility.  See HPI for further details.  EMS report and bring ECG tracing of junctional bradycardic rhythm.  Radial pulse feels regular with a rate at approximately 60.  Awaiting ECG.  DIFFERENTIAL DIAGNOSIS  ICH, CVA, Syncope, Thumb fracture/dislocation  ED COURSE  Labs overall reassuring.  Right thumb dislocation reduced successfully and rinsed extensively.  Wound was closed as described above.   Aluminum foam finger splint was applied.  Tdap updated and doxycycline given due to patient's documented allergy of clindamycin and anaphylaxis to penicillin.  CT is somewhat concerning for potential very tiny area of blood in the left ventricle.  Dr. Lacinda Axon with neurosurgery will consult on the patient in the morning.  Patient will be admitted via hospitalist service.    ___________________________________________   FINAL CLINICAL IMPRESSION(S) / ED DIAGNOSES  Final diagnoses:  Open fracture  Injury  Injury of head, initial encounter  Symptomatic bradycardia     ED Discharge Orders     None        TAKEYA Lynn was evaluated in Emergency Department on 07/13/2020 for the symptoms described in the history of present illness. She was evaluated in the context of the global COVID-19 pandemic, which necessitated consideration that the patient might be at risk for infection with the SARS-CoV-2 virus that causes COVID-19. Institutional protocols and algorithms that pertain to the evaluation of patients at risk for COVID-19 are in a state of rapid change based on information released by regulatory bodies including the CDC and federal and state organizations. These policies and algorithms were followed during the patient's care in the ED.   Note:  This document was prepared using Dragon voice recognition software and may include unintentional dictation errors.    Victorino Dike, FNP 07/13/20 2047    Carrie Mew, MD 07/13/20 2118

## 2020-07-13 NOTE — H&P (Signed)
Courtney Lynn TML:[id number] DOB: 1940/03/22 DOA: 07/13/2020     PCP: Tracie Harrier, MD   Outpatient Specialists:   [facility]   GI Dr. Alice Reichert    Patient arrived to ER on 07/13/20 at 1420 Referred by Attending Vanessa Villisca, MD   Patient coming from:  From facility   [facility]  Chief Complaint:  Chief Complaint  Patient presents with   Fall    HPI: Courtney Lynn is a 80 y.o. female with medical history significant of HTN breast cancer 2017 chronic history of bradycardia, GERD, hyponatremia, dementia, mild pulmonary hypertension, OSA on CPAP    Presented with  fall unwitnessed resulting in right thumb injury [place].  She lives at [facility] facility.  Patient was a bit confused on arrival but she did drink some alcohol today.  In route EMS noted junctional rhythm in 30s.  Patient was reporting some headache from appears to be dangling and cut     Initial COVID TEST  NEGATIVE   Lab Results  Component Value Date   [place] NEGATIVE 07/13/2020   [place] NEGATIVE 04/17/2019   [place] NOT DETECTED 07/07/2018     Regarding pertinent Chronic problems:         HTN on hydralazine, Toprol Benicar     obesity-   BMI Readings from Last 1 Encounters:  07/13/20 37.79 kg/m       OSA -on nocturnal oxygen,  CPAP,         Dementia - on Aricept    Chronic anemia - baseline hg Hemoglobin & Hematocrit  Recent Labs    02/05/20 1209 07/13/20 1430  HGB 11.5* 11.1*     While in ER: Noted to have sodium of 130 Noted to have dislocation of the right thigh which was corrected Wound was closed Was given a dose of doxycycline    ED Triage Vitals  Enc Vitals Group     BP 07/13/20 1630 (!) 157/74     Pulse Rate 07/13/20 1630 (!) 56     Resp 07/13/20 1630 16     Temp --      Temp src --      SpO2 07/13/20 1630 100 %     Weight 07/13/20 1427 200 lb (90.7 kg)     Height 07/13/20 1427 5\' 1"   (1.549 m)     Head Circumference --      Peak Flow --      Pain Score --      Pain Loc --      Pain Edu? --      Excl. in [place]? --   TMAX(24)@     _________________________________________ Significant initial  Findings: Abnormal Labs Reviewed  COMPREHENSIVE METABOLIC PANEL - Abnormal; Notable for the following components:      Result Value   Sodium 130 (*)    CO2 20 (*)    BUN 35 (*)    Creatinine, Ser 1.06 (*)    Calcium 8.8 (*)    Total Protein 6.2 (*)    GFR, Estimated 53 (*)    All other components within normal limits  CBC WITH DIFFERENTIAL/PLATELET - Abnormal; Notable for the following components:   RBC 3.46 (*)    Hemoglobin 11.1 (*)    HCT 32.3 (*)    All other components within normal limits  URINALYSIS, COMPLETE (UACMP) WITH MICROSCOPIC - Abnormal; Notable for the following components:  Color, Urine YELLOW (*)    APPearance CLEAR (*)    All other components within normal limits   ____________________________________________ Ordered CT HEAD  There is a trace amount of layering hyperdensity within the posterior horn of the LEFT lateral ventricle. This could reflect a small amount of intraventricular blood products. Recommend short-term follow-up. 2. No acute fracture or static subluxation of the cervical spine. Unchanged mild multilevel anterolisthesis, degenerative in etiology.  CXR -  NON acute   Right thumb Status post reduction of the previously seen 1st IP joint dislocation. 2. Previously described small avulsion fracture fragments at the 1st IP and 1st MCP joints. ________________________ Troponin 6-   ECG: Ordered Personally reviewed by me showing: HR : 65 Rhythm: First-degree AV block   no evidence of ischemic changes QTC 451 ____________     The recent clinical data is shown below. Vitals:   07/13/20 1700 07/13/20 1730 07/13/20 1800 07/13/20 1830  BP: (!) 155/58 (!) 172/70 (!) 172/60 (!) 176/72  Pulse: (!) 57 (!) 56 (!) 56 63  Resp: 19 12  (!) 21 15  SpO2: 99% 99% 99% 99%  Weight:      Height:        WBC     Component Value Date/Time   WBC 9.3 07/13/2020 1430   LYMPHSABS 1.6 07/13/2020 1430   MONOABS 0.8 07/13/2020 1430   EOSABS 0.3 07/13/2020 1430   BASOSABS 0.1 07/13/2020 1430     Lactic Acid, Venous No results found for: LATICACIDVEN    UA  no evidence of UTI      Urine analysis:    Component Value Date/Time   COLORURINE YELLOW (A) 07/13/2020 1641   APPEARANCEUR CLEAR (A) 07/13/2020 1641   LABSPEC 1.015 07/13/2020 1641   PHURINE 7.0 07/13/2020 1641   GLUCOSEU NEGATIVE 07/13/2020 1641   HGBUR NEGATIVE 07/13/2020 1641   BILIRUBINUR NEGATIVE 07/13/2020 1641   KETONESUR NEGATIVE 07/13/2020 1641   PROTEINUR NEGATIVE 07/13/2020 1641   NITRITE NEGATIVE 07/13/2020 1641   LEUKOCYTESUR NEGATIVE 07/13/2020 1641    Results for orders placed or performed during the hospital encounter of 07/13/20  Resp Panel by RT-PCR (Flu A&B, Covid) Nasopharyngeal Swab     Status: None   Collection Time: 07/13/20  4:29 PM   Specimen: Nasopharyngeal Swab; Nasopharyngeal(NP) swabs in vial transport medium  Result Value Ref Range Status   SARS Coronavirus 2 by RT PCR NEGATIVE NEGATIVE Final        Influenza A by PCR NEGATIVE NEGATIVE Final   Influenza B by PCR NEGATIVE NEGATIVE Final        _______________________________________________________ ER Provider Called:  NS   Dr. Lacinda Axon They Recommend admit to medicine  Will see in AM    _______________________________________________ Hospitalist was called for admission for possible intracranial bleed  The following Work up has been ordered so far:  Orders Placed This Encounter  Procedures   ORTHOPEDIC INJURY TREATMENT   LACERATION REPAIR   Resp Panel by RT-PCR (Flu A&B, Covid) Nasopharyngeal Swab   CT Head Wo Contrast   CT Cervical Spine Wo Contrast   DG Finger Thumb Right   DG Finger Thumb Right   Comprehensive metabolic panel   Lipase, blood   CBC with Differential    Urinalysis, Complete w Microscopic Urine, Clean Catch   Ethanol   Protime-INR   Apply finger splint static   Consult to hospitalist   Airborne and Contact precautions   ED EKG   Following Medications were ordered in ER:  Medications  Tdap (BOOSTRIX) injection 0.5 mL (0.5 mLs Intramuscular Given 07/13/20 1643)  lidocaine (PF) (XYLOCAINE) 1 % injection 5 mL (5 mLs Intradermal Given 07/13/20 1642)  bupivacaine (MARCAINE) 0.5 % injection 20 mL (20 mLs Infiltration Given 07/13/20 1643)  acetaminophen (TYLENOL) tablet 1,000 mg (1,000 mg Oral Given 07/13/20 1642)  doxycycline (VIBRA-TABS) tablet 100 mg (100 mg Oral Given 07/13/20 1642)  sodium chloride 0.9 % bolus 500 mL (500 mLs Intravenous New Bag/Given 07/13/20 1812)        Consult Orders  (From admission, onward)           Start     Ordered   07/13/20 1815  Consult to hospitalist  Once       Provider:  (Not yet assigned)  Question Answer Comment  Place call to: [phone]   Reason for Consult Admit   Diagnosis/Clinical Info for Consult: altered mental status; symptomatic bradycardia      07/13/20 1827              OTHER Significant initial  Findings:  labs showing:    Recent Labs  Lab 07/13/20 1430  NA 130*  K 4.4  CO2 20*  GLUCOSE 91  BUN 35*  CREATININE 1.06*  CALCIUM 8.8*    Cr  Up from baseline see below Lab Results  Component Value Date   CREATININE 1.06 (H) 07/13/2020   CREATININE 0.84 02/05/2020   CREATININE 0.89 12/22/2015    Recent Labs  Lab 07/13/20 1430  AST 23  ALT 19  ALKPHOS 47  BILITOT 0.7  PROT 6.2*  ALBUMIN 3.6   Lab Results  Component Value Date   CALCIUM 8.8 (L) 07/13/2020          Plt: Lab Results  Component Value Date   PLT 300 07/13/2020   COVID-19 Labs  No results for input(s): DDIMER, FERRITIN, LDH, CRP in the last 72 hours.  Lab Results  Component Value Date   SARSCOV2NAA NEGATIVE 07/13/2020   SARSCOV2NAA NEGATIVE 04/17/2019   SARSCOV2NAA NOT DETECTED  07/07/2018     Recent Labs  Lab 07/13/20 1430  WBC 9.3  NEUTROABS 6.5  HGB 11.1*  HCT 32.3*  MCV 93.4  PLT 300    HG/HCT stable,      Component Value Date/Time   HGB 11.1 (L) 07/13/2020 1430   HCT 32.3 (L) 07/13/2020 1430   MCV 93.4 07/13/2020 1430   Recent Labs  Lab 07/13/20 1430  LIPASE 35   No results for input(s): AMMONIA in the last 168 hours.        Cultures: No results found for: SDES, SPECREQUEST, CULT, REPTSTATUS   Radiological Exams on Admission: CT Head Wo Contrast  Addendum Date: 07/13/2020   ADDENDUM REPORT: 07/13/2020 16:16 ADDENDUM: These results were called by telephone at the time of interpretation on 07/13/2020 at 4:15 pm to provider Dr. Joni Fears, who verbally acknowledged these results. Electronically Signed   By: Valentino Saxon MD   On: 07/13/2020 16:16   Result Date: 07/13/2020 CLINICAL DATA:  Fall EXAM: CT HEAD WITHOUT CONTRAST CT CERVICAL SPINE WITHOUT CONTRAST TECHNIQUE: Multidetector CT imaging of the head and cervical spine was performed following the standard protocol without intravenous contrast. Multiplanar CT image reconstructions of the cervical spine were also generated. COMPARISON:  January 31, 2020, February 05, 2020 FINDINGS: CT HEAD FINDINGS Brain: There is trace high density material layering within the posterior horn of the LEFT lateral ventricle (series 5, image 35; series 2, image 14). This is new  since prior. No evidence of acute infarction or hydrocephalus. Revisualization advanced atrophy of the RIGHT greater than LEFT temporal lobes. Periventricular white matter hypodensities consistent with sequela of chronic microvascular ischemic disease. Vascular: Vascular calcifications. Skull: Normal. Negative for fracture or focal lesion. Sinuses/Orbits: Mucous retention cyst of the LEFT maxillary sinus. Other: None. CT CERVICAL SPINE FINDINGS Alignment: Unchanged minimal anterolisthesis of C5-6 and C6-7. Skull base and vertebrae: No acute  fracture. Unchanged minimal wedging of C7 vertebral body. Soft tissues and spinal canal: No prevertebral fluid or swelling. No visible canal hematoma. Disc levels: Mild degenerative changes of C3-4 and C4-5. Osseous ankylosis of the LEFT facets of C4-5. Multilevel facet arthropathy results in multilevel osseous neuroforaminal narrowing most pronounced at LEFT C4-5. Upper chest: Negative. Other: Atherosclerotic calcifications of the carotid arteries. There is sclerotic calcifications of the aorta. IMPRESSION: 1. There is a trace amount of layering hyperdensity within the posterior horn of the LEFT lateral ventricle. This could reflect a small amount of intraventricular blood products. Recommend short-term follow-up. 2. No acute fracture or static subluxation of the cervical spine. Unchanged mild multilevel anterolisthesis, degenerative in etiology. PRA system was activated at 07/13/2020 at 4:05 pm. An addendum will be issued upon telephonic communication with ordering provider. Aortic Atherosclerosis (ICD10-I70.0). Electronically Signed: By: Valentino Saxon MD On: 07/13/2020 16:12   CT Cervical Spine Wo Contrast  Addendum Date: 07/13/2020   ADDENDUM REPORT: 07/13/2020 16:16 ADDENDUM: These results were called by telephone at the time of interpretation on 07/13/2020 at 4:15 pm to provider Dr. Joni Fears, who verbally acknowledged these results. Electronically Signed   By: Valentino Saxon MD   On: 07/13/2020 16:16   Result Date: 07/13/2020 CLINICAL DATA:  Fall EXAM: CT HEAD WITHOUT CONTRAST CT CERVICAL SPINE WITHOUT CONTRAST TECHNIQUE: Multidetector CT imaging of the head and cervical spine was performed following the standard protocol without intravenous contrast. Multiplanar CT image reconstructions of the cervical spine were also generated. COMPARISON:  January 31, 2020, February 05, 2020 FINDINGS: CT HEAD FINDINGS Brain: There is trace high density material layering within the posterior horn of the LEFT  lateral ventricle (series 5, image 35; series 2, image 14). This is new since prior. No evidence of acute infarction or hydrocephalus. Revisualization advanced atrophy of the RIGHT greater than LEFT temporal lobes. Periventricular white matter hypodensities consistent with sequela of chronic microvascular ischemic disease. Vascular: Vascular calcifications. Skull: Normal. Negative for fracture or focal lesion. Sinuses/Orbits: Mucous retention cyst of the LEFT maxillary sinus. Other: None. CT CERVICAL SPINE FINDINGS Alignment: Unchanged minimal anterolisthesis of C5-6 and C6-7. Skull base and vertebrae: No acute fracture. Unchanged minimal wedging of C7 vertebral body. Soft tissues and spinal canal: No prevertebral fluid or swelling. No visible canal hematoma. Disc levels: Mild degenerative changes of C3-4 and C4-5. Osseous ankylosis of the LEFT facets of C4-5. Multilevel facet arthropathy results in multilevel osseous neuroforaminal narrowing most pronounced at LEFT C4-5. Upper chest: Negative. Other: Atherosclerotic calcifications of the carotid arteries. There is sclerotic calcifications of the aorta. IMPRESSION: 1. There is a trace amount of layering hyperdensity within the posterior horn of the LEFT lateral ventricle. This could reflect a small amount of intraventricular blood products. Recommend short-term follow-up. 2. No acute fracture or static subluxation of the cervical spine. Unchanged mild multilevel anterolisthesis, degenerative in etiology. PRA system was activated at 07/13/2020 at 4:05 pm. An addendum will be issued upon telephonic communication with ordering provider. Aortic Atherosclerosis (ICD10-I70.0). Electronically Signed: By: Valentino Saxon MD On: 07/13/2020 16:12  DG Finger Thumb Right  Result Date: 07/13/2020 CLINICAL DATA:  Status post reduction of a right thumb dislocation. EXAM: RIGHT THUMB 2+V COMPARISON:  Earlier today. FINDINGS: Interval reduction of the previously demonstrated  dislocation at the right 1st IP joint. Again demonstrated is a small avulsion fracture of the base of the 1st proximal phalanx at the 1st MCP joint. There are also small avulsion fracture fragments at the radial aspect of the 1st IP joint. IMPRESSION: 1. Status post reduction of the previously seen 1st IP joint dislocation. 2. Previously described small avulsion fracture fragments at the 1st IP and 1st MCP joints. Electronically Signed   By: Claudie Revering M.D.   On: 07/13/2020 17:50   DG Finger Thumb Right  Result Date: 07/13/2020 CLINICAL DATA:  [place].  Injured right thumb. EXAM: RIGHT THUMB 2+V COMPARISON:  None. FINDINGS: Dorsal and radial dislocation of the distal phalanx of the thumb. Associated small avulsion type fractures are noted. There is also a avulsion fracture involving the radial aspect of the proximal phalanx at the MCP joint. IMPRESSION: 1. Dislocation of the distal phalanx of the thumb with small avulsion type fractures. 2. Avulsion fracture involving the radial aspect of the proximal phalanx at the MCP joint. Electronically Signed   By: Marijo Sanes M.D.   On: 07/13/2020 16:18   _______________________________________________________________________________________________________ Latest  Blood pressure (!) 176/72, pulse 63, resp. rate 15, height 5\' 1"  (1.549 m), weight 90.7 kg, SpO2 99 %.   Review of Systems:    Pertinent positives include: fatigue,   Constitutional:  No weight loss, night sweats, Fevers, chills,weight loss  HEENT:  No headaches, Difficulty swallowing,Tooth/dental problems,Sore throat,  No sneezing, itching, ear ache, nasal congestion, post nasal drip,  Cardio-vascular:  No chest pain, Orthopnea, PND, anasarca, dizziness, palpitations.no Bilateral lower extremity swelling  GI:  No heartburn, indigestion, abdominal pain, nausea, vomiting, diarrhea, change in bowel habits, loss of appetite, melena, blood in stool, hematemesis Resp:  no shortness of breath at  rest. No dyspnea on exertion, No excess mucus, no productive cough, No non-productive cough, No coughing up of blood.No change in color of mucus.No wheezing. Skin:  no rash or lesions. No jaundice GU:  no dysuria, change in color of urine, no urgency or frequency. No straining to urinate.  No flank pain.  Musculoskeletal:  No joint pain or no joint swelling. No decreased range of motion. No back pain.  Psych:  No change in mood or affect. No depression or anxiety. No memory loss.  Neuro: no localizing neurological complaints, no tingling, no weakness, no double vision, no gait abnormality, no slurred speech, no confusion  All systems reviewed and apart from [place] all are negative _______________________________________________________________________________________________ Past Medical History:   Past Medical History:  Diagnosis Date   Anemia    Arthritis    Body mass index (bmi) 37.0-37.9, adult    Breast cancer ([place]) 01/02/2016   right breast/radiation   Cancer ([place]) 2017   BREAST   Chronic insomnia    Complication of anesthesia    has difficulty last surgery intubating   Depression    Difficult intubation    WITH TKR   Dizziness    Dysrhythmia    PVC'S,BRADYCARDIA   GERD (gastroesophageal reflux disease)    History of chicken pox    Hypertension    Hyponatremia    Intermittent vertigo    Irregular heart beat    Memory difficulties    Mild mitral regurgitation    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[place])  MVP (mitral valve prolapse)    Orthopnea    Personal history of radiation therapy 2017   Pre-diabetes    PVC's (premature ventricular contractions)    Reflux    Sleep apnea    CPAP      Past Surgical History:  Procedure Laterality Date   APPENDECTOMY     BREAST BIOPSY Right 01/02/2016   PARTIAL MASTECTOMY- [facility] and [facility]   BREAST LUMPECTOMY Right 2017   CATARACT EXTRACTION W/PHACO Right 09/14/2016   Procedure: CATARACT EXTRACTION PHACO AND INTRAOCULAR LENS  PLACEMENT ([place]);  Surgeon: Birder Robson, MD;  Location: [facility] ORS;  Service: Ophthalmology;  Laterality: Right;  [place] 00:32 AP% 19.5 CDE 6.28 Fluid pack lot # [id number]   CATARACT EXTRACTION W/PHACO Left 10/05/2016   Procedure: CATARACT EXTRACTION PHACO AND INTRAOCULAR LENS PLACEMENT ([facility]);  Surgeon: Birder Robson, MD;  Location: [facility] ORS;  Service: Ophthalmology;  Laterality: Left;  [place] 00:46.9 AP% 16.0 CDE 7.48 Fluid Pack lot # [id number]   CHOLECYSTECTOMY     COLONOSCOPY WITH PROPOFOL N/A 08/24/2016   Procedure: COLONOSCOPY WITH PROPOFOL;  Surgeon: Lollie Sails, MD;  Location: [facility] ENDOSCOPY;  Service: Endoscopy;  Laterality: N/A;   COLONOSCOPY WITH PROPOFOL N/A 07/12/2018   Procedure: COLONOSCOPY WITH PROPOFOL;  Surgeon: Toledo, Benay Pike, MD;  Location: [facility] ENDOSCOPY;  Service: Gastroenterology;  Laterality: N/A;   dental implants     FOOT SURGERY Right    FRACTURE SURGERY     KNEE CAP   HEMORRHOID SURGERY N/A 04/19/2019   Procedure: HEMORRHOIDECTOMY;  Surgeon: Benjamine Sprague, DO;  Location: [facility] ORS;  Service: General;  Laterality: N/A;   JOINT REPLACEMENT Right 2011   KNEE   PARTIAL MASTECTOMY WITH NEEDLE LOCALIZATION Right 01/02/2016   Procedure: PARTIAL MASTECTOMY WITH NEEDLE LOCALIZATION;  Surgeon: Leonie Green, MD;  Location: [facility] ORS;  Service: General;  Laterality: Right;   PATELLA FRACTURE SURGERY     REPLACEMENT TOTAL KNEE Right    SENTINEL NODE BIOPSY Right 01/02/2016   Procedure: SENTINEL NODE BIOPSY;  Surgeon: Leonie Green, MD;  Location: [facility] ORS;  Service: General;  Laterality: Right;   TONSILLECTOMY      Social History:  Ambulatory  walker        reports that she has quit smoking. She has never used smokeless tobacco. She reports current alcohol use of about 7.0 standard drinks of alcohol per week. She reports that she does not use drugs.     Family History:   Family History  Problem Relation Age of Onset   GI Bleed Mother    Diabetes  Father    Congestive Heart Failure Father    Breast cancer Neg Hx    ______________________________________________________________________________________________ Allergies: Allergies  Allergen Reactions   Cleocin [Clindamycin Hcl] Swelling, Rash and Other (See Comments)    Tongue, lip swelling Tongue, lip swelling Tongue, lip swelling   Pen Vk [Penicillin V] Swelling, Rash and Other (See Comments)    Tongue, lip swelling Funny feeling on the tongue  Has patient had a PCN reaction causing immediate rash, facial/tongue/throat swelling, SOB or lightheadedness with hypotension:Yes Has patient had a PCN reaction causing severe rash involving mucus membranes or skin necrosis:No Has patient had a PCN reaction that required hospitalization:No Has patient had a PCN reaction occurring within the last 10 years:No If all of the above answers are "NO", then may proceed with Cephalosporin use.      Prior to Admission medications   Medication Sig Start Date End Date Taking? Authorizing Provider  acetaminophen (TYLENOL) 500 MG tablet Take 1,000 mg by mouth at bedtime.    [provider]  ANUCORT-HC 25 MG suppository Place 25 mg rectally 2 (two) times daily. 10/16/19   [provider]  benzonatate (TESSALON) 100 MG capsule Take by mouth.    [provider]  citalopram (CELEXA) 10 MG tablet Take 10 mg by mouth every other day. In the evening    [provider]  docusate sodium (COLACE) 100 MG capsule Take 100 mg by mouth daily.     [provider]  donepezil (ARICEPT) 10 MG tablet Take by mouth. 10/17/19   [provider]  felodipine (PLENDIL) 5 MG 24 hr tablet Take by mouth. 09/04/19 03/02/20  [provider]  gabapentin (NEURONTIN) 100 MG capsule Take 100 mg by mouth at bedtime. 11/05/19   [provider]  hydrALAZINE (APRESOLINE) 50 MG tablet Take 50 mg by mouth 2 (two) times daily. 03/21/19   [provider]  iron  polysaccharides (NIFEREX) 150 MG capsule Take by mouth. 10/22/19   [provider]  Lactase (LACTAID PO) Take by mouth.    [provider]  meloxicam (MOBIC) 15 MG tablet TAKE 1 TABLET(15 MG) BY MOUTH EVERY DAY 02/22/19   [provider]  methocarbamol (ROBAXIN) 500 MG tablet TAKE 1/2 TO 1 TABLET BY MOUTH EVERY NIGHT AT BEDTIME AS NEEDED 03/15/19   [provider]  metoprolol succinate (TOPROL-XL) 25 MG 24 hr tablet Take 25 mg by mouth daily. 01/01/19   [provider]  Multiple Vitamin (MULTIVITAMIN) tablet Take 1 tablet by mouth daily. Osteo matrix 1 a day, omega 10 1 a day    [provider]  Multiple Vitamins-Minerals (MEMORY VITE PO) Take 1 capsule by mouth 2 (two) times daily.    [provider]  NON FORMULARY Take 1 capsule by mouth at bedtime. OmegaGuard (active ingredients) Fish oil 1200 mg-Omega 3 667 mg-EPA 363 MG-DHA 240 MG    [provider]  NONFORMULARY OR COMPOUNDED ITEM Take 1-2 capsules by mouth 3 (three) times daily. LunaRich X (Lunasin-soy peptide) 2 capsule in the morning, 2 capsules mid-day, and 1 capsule at bedtime.    [provider]  Nutritional Supplements (NUTRITIONAL SUPPLEMENT PO) Take 1 capsule by mouth daily. Protandim Nrf2 Synergizer Milk thistle (Silybum marianum) extract (225 mg) Bacopa (Bacopa monnieri) extract (150 mg) Ashwagandha (Withania somnifera) root (150 mg) Green tea (Camellia sinensis) extract (75 mg) Turmeric (Curcuma longa) extract (75 mg)    [provider]  olmesartan (BENICAR) 40 MG tablet Take 40 mg by mouth daily.  03/25/18   [provider]  omeprazole (PRILOSEC) 20 MG capsule TAKE 1 CAPSULE BY MOUTH EVERY DAY 08/19/16   [provider]  OVER THE COUNTER MEDICATION Take 1 capsule by mouth at bedtime. Joint Health Complex  (active ingredients) 60 mg Vitamin C-1.5 mg Zinc-0.2 mg Copper-0.2 mg Manganese-1500 mg Glucosamine-100 mg [place]     [provider]  predniSONE (DELTASONE) 10 MG tablet Take 10 mg by mouth 2 (two) times daily. 08/16/19   [provider]  Probiotic Product (PROBIOTIC PO) Take 1 capsule by mouth daily.    [provider]  traMADol (ULTRAM) 50 MG tablet Take 50 mg by mouth daily as needed.  03/14/19   [provider]    ___________________________________________________________________________________________________ Physical Exam: Vitals with BMI 07/13/2020 07/13/2020 07/13/2020  Height - - -  Weight - - -  BMI - - -  Systolic [phone]  Diastolic 72 60 70  Pulse 63 56 56     1. General:  in No Acute distress   Chronically ill    -appearing 2. Psychological: Alert and  Oriented to self 3. Head/ENT:    Dry Mucous Membranes                          Head  traumatic bruise over left  cheek, neck supple                          Poor Dentition 4. SKIN:  decreased Skin turgor,  Skin clean Dry and intact no rash 5. Heart: Regular rate and rhythm no Murmur, no Rub or gallop 6. Lungs:  , no wheezes or crackles   7. Abdomen: Soft,  non-tender, Non distended   obese bowel sounds present 8. Lower extremities: no clubbing, cyanosis, no  edema 9. Neurologically Grossly intact, moving all 4 extremities equally  10. MSK: Normal range of motion    Chart has been reviewed  ______________________________________________________________________________________________  Assessment/Plan  80 y.o. female with medical history significant of HTN breast cancer 2017 chronic history of bradycardia, GERD, hyponatremia, dementia, mild pulmonary hypertension, OSA on CPAP Admitted for fall, possible intracranial bleed  Present on Admission: Possible intracranial bleeding ER provider discussed with neurosurgery will observe overnight repeat CT in a.m. neurosurgery will see in consult in a.m. Serial neurochecks   Syncope and collapse   [place].  Was noted to be in junctional rhythm at that time. Obtain echogram Appreciate cardiology input Troponin within normal limits currently back to sinus rhythm Hold Toprol Observe on telemetry    Poorly-controlled hypertension restart medications except for Toprol and ARB   Hyponatremia -chronic stable obtain urine electrolytes    Chronic anemia obtain anemia panel   Dementia (HCC) continue Aricept monitor for any sign of sundowning   Closed dislocation of right thumb reduced by ED will need to follow-up with orthopedics as an outpatient given laceration at the site was started on doxycycline by ER we will continue for now   AKI (acute kidney injury) ([place]) gently rehydrate hold ARB continue to follow renal function  Alcohol use -reports she drinks about 1-2 alcoholic drinks a day.  Does not endorse withdrawals.  We will continue to monitor for any sign of withdrawal  OSA - resume CPAP  GERD - resume PPI  Other plan as per orders.  DVT prophylaxis:  SCD      Code Status:    Code Status: Prior FULL CODE  as per patient   I had personally discussed CODE STATUS with patient     Family Communication:   Family not at  Bedside  No local family  Disposition Plan:                             Back to current facility when stable                              Following barriers for discharge:                            Electrolytes corrected  Anemia corrected                             Pain controlled with PO medications                                                           Will need consultants to evaluate patient prior to discharge                        Would benefit from PT/OT eval prior to DC  Ordered                   Swallow eval - SLP ordered                                       Transition of care consulted                   Nutrition    consulted                  Wound care  consulted                    Palliative care    consulted                   Behavioral health  consulted                    Consults called:  NS  was made aware, cardiology Dr. Nehemiah Massed will see in AM  Admission status:  ED Disposition     ED Disposition  [place]: [place] [[id number]]  Level of Care: Progressive Cardiac [[id number]]  Admit to Progressive based on following criteria: CARDIOVASCULAR & THORACIC of moderate stability with acute coronary syndrome symptoms/low risk myocardial infarction/hypertensive urgency/arrhythmias/heart failure potentially compromising stability and stable post cardiovascular intervention patients.  Admit to Progressive based on following criteria: NEUROLOGICAL AND NEUROSURGICAL complex patients with significant risk of instability, who do not meet ICU criteria, yet require close observation or frequent assessment (< / = every 2 - 4 hours) with medical / nursing intervention.  Covid Evaluation: Asymptomatic Screening Protocol (No Symptoms)  Diagnosis: Intracranial bleed ([place]) [453646]  Admitting Physician: Toy Baker [[id number]]  Attending Physician: Toy Baker [[id number]]           Obs       Level of care  progressive   tele indefinitely please discontinue once patient no longer qualifies COVID-19 Labs    Lab Results  Component Value Date   [place] NEGATIVE 07/13/2020     Precautions: admitted as Covid Negative     PPE: Used by the provider:   N95  eye Goggles,  Gloves       Crandall Harvel 07/13/2020, 8:18 PM    [facility]     after 2 AM please page floor coverage PA If 7AM-7PM, please contact the day team taking care of the patient using [email or web address]   Patient was evaluated in the context of the global COVID-19 pandemic, which necessitated consideration that the  patient might be at risk for infection with the SARS-CoV-2 virus that causes COVID-19. Institutional protocols and  algorithms that pertain to the evaluation of patients at risk for COVID-19 are in a state of rapid change based on information released by regulatory bodies including the CDC and federal and state organizations. These policies and algorithms were followed during the patient's care.

## 2020-07-13 NOTE — ED Notes (Signed)
Pt was assisted to bathroom and assisted back to bed. Pt was given warm blankets and water.

## 2020-07-14 ENCOUNTER — Observation Stay
Admit: 2020-07-14 | Discharge: 2020-07-14 | Disposition: A | Discharge status: Home / Self Care | Payer: PPO | Attending: Internal Medicine | Admitting: Internal Medicine

## 2020-07-14 ENCOUNTER — Other Ambulatory Visit: Payer: Self-pay

## 2020-07-14 DIAGNOSIS — F0390 Unspecified dementia without behavioral disturbance: Secondary | ICD-10-CM | POA: Diagnosis not present

## 2020-07-14 DIAGNOSIS — G4733 Obstructive sleep apnea (adult) (pediatric): Secondary | ICD-10-CM | POA: Diagnosis not present

## 2020-07-14 DIAGNOSIS — W19XXXA Unspecified fall, initial encounter: Secondary | ICD-10-CM | POA: Diagnosis present

## 2020-07-14 DIAGNOSIS — R001 Bradycardia, unspecified: Secondary | ICD-10-CM | POA: Diagnosis not present

## 2020-07-14 DIAGNOSIS — R55 Syncope and collapse: Secondary | ICD-10-CM | POA: Diagnosis not present

## 2020-07-14 DIAGNOSIS — I629 Nontraumatic intracranial hemorrhage, unspecified: Secondary | ICD-10-CM | POA: Diagnosis not present

## 2020-07-14 DIAGNOSIS — Z9989 Dependence on other enabling machines and devices: Secondary | ICD-10-CM | POA: Diagnosis not present

## 2020-07-14 DIAGNOSIS — S63104A Unspecified dislocation of right thumb, initial encounter: Secondary | ICD-10-CM | POA: Diagnosis not present

## 2020-07-14 DIAGNOSIS — I10 Essential (primary) hypertension: Secondary | ICD-10-CM | POA: Diagnosis not present

## 2020-07-14 LAB — CBC WITH DIFFERENTIAL/PLATELET
Abs Immature Granulocytes: 0.04 10*3/uL (ref 0.00–0.07)
Basophils Absolute: 0.1 10*3/uL (ref 0.0–0.1)
Basophils Relative: 1 %
Eosinophils Absolute: 0.1 10*3/uL (ref 0.0–0.5)
Eosinophils Relative: 1 %
HCT: 34.3 % — ABNORMAL LOW (ref 36.0–46.0)
Hemoglobin: 12.3 g/dL (ref 12.0–15.0)
Immature Granulocytes: 0 %
Lymphocytes Relative: 8 %
Lymphs Abs: 0.9 10*3/uL (ref 0.7–4.0)
MCH: 33.1 pg (ref 26.0–34.0)
MCHC: 35.9 g/dL (ref 30.0–36.0)
MCV: 92.2 fL (ref 80.0–100.0)
Monocytes Absolute: 0.9 10*3/uL (ref 0.1–1.0)
Monocytes Relative: 8 %
Neutro Abs: 8.8 10*3/uL — ABNORMAL HIGH (ref 1.7–7.7)
Neutrophils Relative %: 82 %
Platelets: 328 10*3/uL (ref 150–400)
RBC: 3.72 MIL/uL — ABNORMAL LOW (ref 3.87–5.11)
RDW: 14.2 % (ref 11.5–15.5)
WBC: 10.7 10*3/uL — ABNORMAL HIGH (ref 4.0–10.5)
nRBC: 0 % (ref 0.0–0.2)

## 2020-07-14 LAB — COMPREHENSIVE METABOLIC PANEL
ALT: 20 U/L (ref 0–44)
AST: 30 U/L (ref 15–41)
Albumin: 3.7 g/dL (ref 3.5–5.0)
Alkaline Phosphatase: 51 U/L (ref 38–126)
Anion gap: 5 (ref 5–15)
BUN: 24 mg/dL — ABNORMAL HIGH (ref 8–23)
CO2: 26 mmol/L (ref 22–32)
Calcium: 8.8 mg/dL — ABNORMAL LOW (ref 8.9–10.3)
Chloride: 104 mmol/L (ref 98–111)
Creatinine, Ser: 0.7 mg/dL (ref 0.44–1.00)
GFR, Estimated: >60 mL/min (ref >60)
Glucose, Bld: 108 mg/dL — ABNORMAL HIGH (ref 70–99)
Potassium: 3.8 mmol/L (ref 3.5–5.1)
Sodium: 135 mmol/L (ref 135–145)
Total Bilirubin: 0.9 mg/dL (ref 0.3–1.2)
Total Protein: 6.6 g/dL (ref 6.5–8.1)

## 2020-07-14 LAB — LIPID PANEL
Cholesterol: 227 mg/dL — ABNORMAL HIGH (ref 0–200)
HDL: 61 mg/dL (ref >40)
LDL Cholesterol: 158 mg/dL — ABNORMAL HIGH (ref 0–99)
Total CHOL/HDL Ratio: 3.7 RATIO
Triglycerides: 38 mg/dL (ref <150)
VLDL: 8 mg/dL (ref 0–40)

## 2020-07-14 LAB — ECHOCARDIOGRAM COMPLETE
AR max vel: 2.79 cm2
AV Area VTI: 2.72 cm2
AV Area mean vel: 2.67 cm2
AV Mean grad: 9 mmHg
AV Peak grad: 15.5 mmHg
Ao pk vel: 1.97 m/s
Area-P 1/2: 3.81 cm2
Height: 61 in
MV VTI: 3.16 cm2
S' Lateral: 2.8 cm
Weight: 2960 oz

## 2020-07-14 LAB — HEMOGLOBIN A1C
Hgb A1c MFr Bld: 4.9 % (ref 4.8–5.6)
Mean Plasma Glucose: 94 mg/dL

## 2020-07-14 LAB — VITAMIN B12: Vitamin B-12: 1318 pg/mL — ABNORMAL HIGH (ref 180–914)

## 2020-07-14 LAB — FOLATE: Folate: 30.6 ng/mL (ref >5.9)

## 2020-07-14 LAB — MAGNESIUM: Magnesium: 2 mg/dL (ref 1.7–2.4)

## 2020-07-14 LAB — TSH: TSH: 0.754 u[IU]/mL (ref 0.350–4.500)

## 2020-07-14 LAB — PHOSPHORUS: Phosphorus: 3.1 mg/dL (ref 2.5–4.6)

## 2020-07-14 MED ORDER — HYDROCODONE-ACETAMINOPHEN 5-325 MG PO TABS: 1.0000 | ORAL_TABLET | ORAL | 0 refills | Status: DC | PRN

## 2020-07-14 MED ORDER — HYDRALAZINE HCL 20 MG/ML IJ SOLN
10.0000 mg | INTRAMUSCULAR | Status: DC | PRN
  Administered 2020-07-14: 10 mg via INTRAVENOUS
  Filled 2020-07-14: qty 1

## 2020-07-14 MED ORDER — THIAMINE HCL 100 MG/ML IJ SOLN: 100.0000 mg | Freq: Every day | INTRAMUSCULAR | Status: DC

## 2020-07-14 MED ORDER — ADULT MULTIVITAMIN W/MINERALS CH: 1.0000 | ORAL_TABLET | Freq: Every day | ORAL | Status: DC

## 2020-07-14 MED ORDER — IRBESARTAN 150 MG PO TABS
300.0000 mg | ORAL_TABLET | Freq: Every day | ORAL | Status: DC
  Administered 2020-07-14: 300 mg via ORAL
  Filled 2020-07-14: qty 2

## 2020-07-14 MED ORDER — DOXYCYCLINE HYCLATE 100 MG PO TABS: 100.0000 mg | ORAL_TABLET | Freq: Two times a day (BID) | ORAL | 0 refills | Status: DC

## 2020-07-14 MED ORDER — HYDRALAZINE HCL 50 MG PO TABS
50.0000 mg | ORAL_TABLET | Freq: Two times a day (BID) | ORAL | Status: DC
  Administered 2020-07-14: 50 mg via ORAL
  Filled 2020-07-14: qty 1

## 2020-07-14 MED ORDER — LORAZEPAM 1 MG PO TABS: 1.0000 mg | ORAL_TABLET | ORAL | Status: DC | PRN

## 2020-07-14 MED ORDER — THIAMINE HCL 100 MG PO TABS: 100.0000 mg | ORAL_TABLET | Freq: Every day | ORAL | Status: DC

## 2020-07-14 MED ORDER — FOLIC ACID 1 MG PO TABS: 1.0000 mg | ORAL_TABLET | Freq: Every day | ORAL | Status: DC

## 2020-07-14 MED ORDER — FELODIPINE ER 5 MG PO TB24
5.0000 mg | ORAL_TABLET | Freq: Every day | ORAL | Status: DC
  Filled 2020-07-14 (×2): qty 1

## 2020-07-14 MED ORDER — LORAZEPAM 2 MG/ML IJ SOLN
1.0000 mg | INTRAMUSCULAR | Status: DC | PRN
Start: 2020-07-14 — End: 2020-07-14

## 2020-07-14 NOTE — Progress Notes (Signed)
*  PRELIMINARY RESULTS* Echocardiogram 2D Echocardiogram has been performed.  Courtney Lynn 07/14/2020, 9:00 AM

## 2020-07-14 NOTE — TOC Transition Note (Addendum)
Transition of Care [facility]) - CM/SW Discharge Note   Patient Details  Name: Courtney Lynn MRN: [id number] Date of Birth: 02/15/1940  Transition of Care [facility]) CM/SW Contact:  Ova Freshwater Phone Number: [phone] 07/14/2020, 2:42 PM   Clinical Narrative:     Patient will d/c back to [facility]. [facility] transportation will pickup patient, Richardson Landry requested a call once the patient is ready for d/c [phone].  CSW updated Attending and ED Staff.  Pt recommendation for home health PT/OT.  CSW spoke with Luellen Pucker at [facility] and she confirmed their preferred home health agency accepts the patient's insurance. Patient's preferred pharmacy is [facility] in [place] and requested the prescriptions be sent to the pharmacy via the [facility].  Discharge summary faxed to Austin Miles RN, at [facility] [phone]        Patient Goals and CMS Choice        Discharge Placement                       Discharge Plan and Services                                     Social Determinants of Health (SDOH) Interventions     Readmission Risk Interventions No flowsheet data found.

## 2020-07-14 NOTE — ED Notes (Signed)
Report given to Mike RN.

## 2020-07-14 NOTE — Discharge Summary (Addendum)
Physician Discharge Summary  Courtney Lynn:[id number] DOB: 1940-08-17 DOA: 07/13/2020  PCP: Tracie Harrier, MD  Admit date: 07/13/2020 Discharge date: 07/14/2020  Admitted From: ALF  Disposition:  ALF with HH   Recommendations for Outpatient Follow-up:  Follow up with Cardiology Dr. Lorinda Creed for bradycardic episode and syncope in 1 week Follow up with Orthopedics Dr. Mack Guise or next available for thumb dislocation in 1-2 weeks Keep thumb spint in place until Orthopedics follow up Remove 5 thumb sutures in 10-14 days, around June 23-26 Take antibiotics to prevent infection in the hand after hand laceration, for 1 week       Home Health: PT/OT due to balance issues  Equipment/Devices: None new  Discharge Condition: Good  CODE STATUS: FULL Diet recommendation: Regular  Brief/Interim Summary: Courtney Lynn is an 79.yo. F with dementia, ALF-dwelling, HTN, mild pHTN, and OSA on CPAP who presented with fall and junctional rhythma and thumb dislocation.  Acted from [facility] as patient has dementia.  Evidently she had a unwitnessed fall.  Possibly had had a small amount of alcohol.  Found to have a laceration on her thumb as well as deformity.  EMS noted she was dizzy and had a junctional rhythm.    In the ER noted to have dislocated thumb.  CT head showed small intraventricular hemorrhage.      PRINCIPAL HOSPITAL DIAGNOSIS: Fall from dislocation, thumb laceration, intraventricular hemorrhage    Discharge Diagnoses:   Possible syncope and fall Junctional bradycardia Patient had an unwitnessed fall, remembers no details, however found to be dizzy and in junctional rhythm by EMS (in the setting of metoprolol and donepezil).  Her metoprolol was held, she was evaluated by cardiology, who recommended outpatient follow-up, holding metoprolol, and Holter monitor.      Thumb laceration and dislocation The patient's laceration was closed with 5 sutures in the ER.   She was started on doxycycline for 1 week for infection prophylaxis.  She should follow-up with orthopedics and remain in her splint until discharge by orthopedics.   Intraventricular hemorrhage Patient had a CT head which showed a small intraventricular hemorrhage.  Neurosurgery was consulted recommended no follow-up necessary for this stable asymptomatic intracranial bleed.     Dementia  Hypertension  Sleep apnea  Mild pulmonary hypertension  AKI ruled out  Hyponatremia Mild, asymptomatic.         Discharge Instructions  Discharge Instructions     Diet - low sodium heart healthy   Complete by: As directed    Discharge instructions   Complete by: As directed    For the thumb dislocation: Schedule an appointment with Orthopedics in 1-2 weeks Maintain the splint until they inform you to remove it Take antibiotics to prevent infection for 6 days Take out sutures in 10-14 days sometime between June 23-26   For the brain "intraventricular hemorrhage" no follow up is needed Go see your primary care in 1-2 weeks   For the slow heart rate and passing out: Schedule an appointment with your cardiologist in 1 week STOP metoprolol   Discharge wound care:   Complete by: As directed    May use triple antibiotics ointment or vaseline to laceration once daily and cover with simple bandage. Remove sutures in 10-14 days   Increase activity slowly   Complete by: As directed       Allergies as of 07/14/2020       Reactions   Cleocin [clindamycin Hcl] Swelling, Rash, Other (See Comments)   Tongue,  lip swelling Tongue, lip swelling Tongue, lip swelling   Pen Vk [penicillin V] Swelling, Rash, Other (See Comments)   Tongue, lip swelling Funny feeling on the tongue Has patient had a PCN reaction causing immediate rash, facial/tongue/throat swelling, SOB or lightheadedness with hypotension:Yes Has patient had a PCN reaction causing severe rash involving mucus membranes or  skin necrosis:No Has patient had a PCN reaction that required hospitalization:No Has patient had a PCN reaction occurring within the last 10 years:No If all of the above answers are "NO", then may proceed with Cephalosporin use.        Medication List     STOP taking these medications    metoprolol succinate 25 MG 24 hr tablet Commonly known as: TOPROL-XL       TAKE these medications    acetaminophen 500 MG tablet Commonly known as: TYLENOL Take 1,000 mg by mouth at bedtime.   Benefiber Chew Chew 1 tablet by mouth daily.   citalopram 10 MG tablet Commonly known as: CELEXA Take 10 mg by mouth every other day.   docusate sodium 100 MG capsule Commonly known as: COLACE Take 100 mg by mouth daily.   donepezil 10 MG tablet Commonly known as: ARICEPT Take 10 mg by mouth at bedtime.   doxycycline 100 MG tablet Commonly known as: VIBRA-TABS Take 1 tablet (100 mg total) by mouth every 12 (twelve) hours.   felodipine 5 MG 24 hr tablet Commonly known as: PLENDIL Take 5 mg by mouth daily.   gabapentin 100 MG capsule Commonly known as: NEURONTIN Take 200 mg by mouth at bedtime.   hydrALAZINE 50 MG tablet Commonly known as: APRESOLINE Take 50 mg by mouth 2 (two) times daily.   HYDROcodone-acetaminophen 5-325 MG tablet Commonly known as: NORCO/VICODIN Take 1-2 tablets by mouth every 4 (four) hours as needed for moderate pain.   iron polysaccharides 150 MG capsule Commonly known as: NIFEREX Take 150 mg by mouth daily.   lactase 3000 units tablet Commonly known as: LACTAID Take 1 tablet by mouth daily.   meloxicam 15 MG tablet Commonly known as: MOBIC Take 15 mg by mouth daily.   methocarbamol 500 MG tablet Commonly known as: ROBAXIN Take 500 mg by mouth at bedtime.   multivitamin tablet Take 1 tablet by mouth 2 (two) times daily.   olmesartan 40 MG tablet Commonly known as: BENICAR Take 40 mg by mouth daily.   omeprazole 20 MG capsule Commonly known  as: PRILOSEC Take 20 mg by mouth daily.   OVER THE COUNTER MEDICATION Take 1 capsule by mouth daily. **Shaklee OmegaGuard**   OVER THE COUNTER MEDICATION Take 1 capsule by mouth 2 (two) times daily. **Omega Q Plus**   OVER THE COUNTER MEDICATION Take 1 tablet by mouth daily. **OptiBiotic**   OVER THE COUNTER MEDICATION Take 2 capsules by mouth daily. **Shaklee OsteoMatrix**   OVER THE COUNTER MEDICATION Take 1 capsule by mouth daily. **LifeVantage Protandim**   OVER THE COUNTER MEDICATION Take 2 tablets by mouth daily with lunch. **Shaklee Blood Pressure**               Discharge Care Instructions  (From admission, onward)           Start     Ordered   07/14/20 0000  Discharge wound care:       Comments: May use triple antibiotics ointment or vaseline to laceration once daily and cover with simple bandage. Remove sutures in 10-14 days   07/14/20 1437  Follow-up Information     Paraschos, Alexander, MD Follow up in 1 week(s).   Specialty: Cardiology Contact information: [facility] [place] [address] [phone]         Thornton Park, MD. Schedule an appointment as soon as possible for a visit in 2 week(s).   Specialty: Orthopedic Surgery Why: For thumb dislocation Contact information: [place] [address] [phone]         Tracie Harrier, MD. Schedule an appointment as soon as possible for a visit in 1 week(s).   Specialty: Internal Medicine Contact information: [place] [address] [phone]                Allergies  Allergen Reactions   Cleocin [Clindamycin Hcl] Swelling, Rash and Other (See Comments)    Tongue, lip swelling Tongue, lip swelling Tongue, lip swelling   Pen Vk [Penicillin V] Swelling, Rash and Other (See Comments)    Tongue, lip swelling Funny feeling on the tongue  Has  patient had a PCN reaction causing immediate rash, facial/tongue/throat swelling, SOB or lightheadedness with hypotension:Yes Has patient had a PCN reaction causing severe rash involving mucus membranes or skin necrosis:No Has patient had a PCN reaction that required hospitalization:No Has patient had a PCN reaction occurring within the last 10 years:No If all of the above answers are "NO", then may proceed with Cephalosporin use.     Consultations: Neurosurgery Cardiology   Procedures/Studies: CT HEAD WO CONTRAST  Result Date: 07/13/2020 CLINICAL DATA:  Unwitnessed fall.  Follow-up earlier head CT. EXAM: CT HEAD WITHOUT CONTRAST TECHNIQUE: Contiguous axial images were obtained from the base of the skull through the vertex without intravenous contrast. COMPARISON:  Earlier head CT, same date. FINDINGS: Brain: Stable age related cerebral atrophy, ventriculomegaly and periventricular white matter disease. Stable small amount of layering high attenuation material in the occipital horn of the left lateral ventricle likely a small amount of intraventricular hemorrhage. I do not see any other areas of intracranial hemorrhage. No hemorrhagic contusions or subarachnoid hemorrhage. No mass lesions. The brainstem and cerebellum are normal. Vascular: Vascular calcifications but no aneurysm or hyperdense vessels. Skull: No skull fracture or bone lesions. Sinuses/Orbits: The paranasal sinuses and mastoid air cells are clear. There is a polyp or mucous retention cyst in the left maxillary sinus. Other: No scalp lesions or scalp hematoma. IMPRESSION: 1. Stable small amount of layering high attenuation material in the occipital horn of the left lateral ventricle likely a small amount of intraventricular hemorrhage. 2. Stable age related cerebral atrophy, ventriculomegaly and periventricular white matter disease. Electronically Signed   By: Marijo Sanes M.D.   On: 07/13/2020 20:36   CT Head Wo Contrast  Addendum  Date: 07/13/2020   ADDENDUM REPORT: 07/13/2020 16:16 ADDENDUM: These results were called by telephone at the time of interpretation on 07/13/2020 at 4:15 pm to provider Dr. Joni Fears, who verbally acknowledged these results. Electronically Signed   By: Valentino Saxon MD   On: 07/13/2020 16:16   Result Date: 07/13/2020 CLINICAL DATA:  Fall EXAM: CT HEAD WITHOUT CONTRAST CT CERVICAL SPINE WITHOUT CONTRAST TECHNIQUE: Multidetector CT imaging of the head and cervical spine was performed following the standard protocol without intravenous contrast. Multiplanar CT image reconstructions of the cervical spine were also generated. COMPARISON:  January 31, 2020, February 05, 2020 FINDINGS: CT HEAD FINDINGS Brain: There is trace high density material layering within the posterior horn of the LEFT  lateral ventricle (series 5, image 35; series 2, image 14). This is new since prior. No evidence of acute infarction or hydrocephalus. Revisualization advanced atrophy of the RIGHT greater than LEFT temporal lobes. Periventricular white matter hypodensities consistent with sequela of chronic microvascular ischemic disease. Vascular: Vascular calcifications. Skull: Normal. Negative for fracture or focal lesion. Sinuses/Orbits: Mucous retention cyst of the LEFT maxillary sinus. Other: None. CT CERVICAL SPINE FINDINGS Alignment: Unchanged minimal anterolisthesis of C5-6 and C6-7. Skull base and vertebrae: No acute fracture. Unchanged minimal wedging of C7 vertebral body. Soft tissues and spinal canal: No prevertebral fluid or swelling. No visible canal hematoma. Disc levels: Mild degenerative changes of C3-4 and C4-5. Osseous ankylosis of the LEFT facets of C4-5. Multilevel facet arthropathy results in multilevel osseous neuroforaminal narrowing most pronounced at LEFT C4-5. Upper chest: Negative. Other: Atherosclerotic calcifications of the carotid arteries. There is sclerotic calcifications of the aorta. IMPRESSION: 1. There is a  trace amount of layering hyperdensity within the posterior horn of the LEFT lateral ventricle. This could reflect a small amount of intraventricular blood products. Recommend short-term follow-up. 2. No acute fracture or static subluxation of the cervical spine. Unchanged mild multilevel anterolisthesis, degenerative in etiology. PRA system was activated at 07/13/2020 at 4:05 pm. An addendum will be issued upon telephonic communication with ordering provider. Aortic Atherosclerosis (ICD10-I70.0). Electronically Signed: By: Valentino Saxon MD On: 07/13/2020 16:12   CT Cervical Spine Wo Contrast  Addendum Date: 07/13/2020   ADDENDUM REPORT: 07/13/2020 16:16 ADDENDUM: These results were called by telephone at the time of interpretation on 07/13/2020 at 4:15 pm to provider Dr. Joni Fears, who verbally acknowledged these results. Electronically Signed   By: Valentino Saxon MD   On: 07/13/2020 16:16   Result Date: 07/13/2020 CLINICAL DATA:  Fall EXAM: CT HEAD WITHOUT CONTRAST CT CERVICAL SPINE WITHOUT CONTRAST TECHNIQUE: Multidetector CT imaging of the head and cervical spine was performed following the standard protocol without intravenous contrast. Multiplanar CT image reconstructions of the cervical spine were also generated. COMPARISON:  January 31, 2020, February 05, 2020 FINDINGS: CT HEAD FINDINGS Brain: There is trace high density material layering within the posterior horn of the LEFT lateral ventricle (series 5, image 35; series 2, image 14). This is new since prior. No evidence of acute infarction or hydrocephalus. Revisualization advanced atrophy of the RIGHT greater than LEFT temporal lobes. Periventricular white matter hypodensities consistent with sequela of chronic microvascular ischemic disease. Vascular: Vascular calcifications. Skull: Normal. Negative for fracture or focal lesion. Sinuses/Orbits: Mucous retention cyst of the LEFT maxillary sinus. Other: None. CT CERVICAL SPINE FINDINGS Alignment:  Unchanged minimal anterolisthesis of C5-6 and C6-7. Skull base and vertebrae: No acute fracture. Unchanged minimal wedging of C7 vertebral body. Soft tissues and spinal canal: No prevertebral fluid or swelling. No visible canal hematoma. Disc levels: Mild degenerative changes of C3-4 and C4-5. Osseous ankylosis of the LEFT facets of C4-5. Multilevel facet arthropathy results in multilevel osseous neuroforaminal narrowing most pronounced at LEFT C4-5. Upper chest: Negative. Other: Atherosclerotic calcifications of the carotid arteries. There is sclerotic calcifications of the aorta. IMPRESSION: 1. There is a trace amount of layering hyperdensity within the posterior horn of the LEFT lateral ventricle. This could reflect a small amount of intraventricular blood products. Recommend short-term follow-up. 2. No acute fracture or static subluxation of the cervical spine. Unchanged mild multilevel anterolisthesis, degenerative in etiology. PRA system was activated at 07/13/2020 at 4:05 pm. An addendum will be issued upon telephonic communication with ordering provider. Aortic Atherosclerosis (  ICD10-I70.0). Electronically Signed: By: Valentino Saxon MD On: 07/13/2020 16:12   US Carotid Bilateral  Result Date: 07/13/2020 CLINICAL DATA:  Initial evaluation for acute syncope. EXAM: BILATERAL CAROTID DUPLEX ULTRASOUND TECHNIQUE: Pearline Cables scale imaging, color Doppler and duplex ultrasound were performed of bilateral carotid and vertebral arteries in the neck. COMPARISON:  None. FINDINGS: Criteria: Quantification of carotid stenosis is based on velocity parameters that correlate the residual internal carotid diameter with NASCET-based stenosis levels, using the diameter of the distal internal carotid lumen as the denominator for stenosis measurement. The following velocity measurements were obtained: RIGHT ICA: 116/27 cm/sec CCA: 93/26 cm/sec SYSTOLIC ICA/CCA RATIO:  1.9 ECA: 91 cm/sec LEFT ICA: 131/22 cm/sec CCA: 71/24 cm/sec  SYSTOLIC ICA/CCA RATIO:  2.2 ECA: 158 cm/sec RIGHT CAROTID ARTERY: Mild intimal thickening within the visualized right CCA without stenosis. Mild intimal thickening with irregular echogenic plaque about the right carotid bulb extending into the proximal right ICA. No significant elevation in peak systolic velocity to suggest hemodynamically significant stenosis. Right ICA patent distally. RIGHT VERTEBRAL ARTERY:  Antegrade. LEFT CAROTID ARTERY: Scattered intimal thickening present throughout the visualized left CCA without significant stenosis. Moderate bulky echogenic calcified plaque at the left carotid bulb extending into the proximal left ICA. Associated elevation of the peak systolic velocity up to 580 cm/sec, suggesting a stenosis of 50-69%. Additionally, systolic ICA/CCA ratio is mildly elevated at 2.2. The partially visualized distal left ICA is tortuous. Note also made of elevated peak systolic velocity within the left ECA. LEFT VERTEBRAL ARTERY:  Antegrade. IMPRESSION: 1. Moderate bulky calcified plaque at the left carotid bulb extending into the proximal left ICA, with associated 50-69% stenosis based on ultrasound criteria. Correlation with dedicated MRA and/or CTA could be performed for further evaluation as warranted. 2. Mild atheromatous change about the right carotid bulb/proximal right ICA, but no hemodynamically significant stenosis by ultrasound criteria. 3. Patent antegrade flow within both vertebral arteries. Electronically Signed   By: Jeannine Boga M.D.   On: 07/13/2020 21:45   DG Chest Port 1 View  Result Date: 07/13/2020 CLINICAL DATA:  Status post fall EXAM: PORTABLE CHEST 1 VIEW COMPARISON:  Chest x-ray 02/05/2020 FINDINGS: The heart size and mediastinal contours are unchanged. Aortic arch calcification. Biapical pleural/pulmonary scarring. No focal consolidation. Similar appearing coarsened interstitial markings with no overt pulmonary edema. No pleural effusion. No  pneumothorax. No acute osseous abnormality. Degenerative changes of bilateral shoulders. IMPRESSION: No active disease. Electronically Signed   By: Iven Finn M.D.   On: 07/13/2020 19:42   DG Finger Thumb Right  Result Date: 07/13/2020 CLINICAL DATA:  Status post reduction of a right thumb dislocation. EXAM: RIGHT THUMB 2+V COMPARISON:  Earlier today. FINDINGS: Interval reduction of the previously demonstrated dislocation at the right 1st IP joint. Again demonstrated is a small avulsion fracture of the base of the 1st proximal phalanx at the 1st MCP joint. There are also small avulsion fracture fragments at the radial aspect of the 1st IP joint. IMPRESSION: 1. Status post reduction of the previously seen 1st IP joint dislocation. 2. Previously described small avulsion fracture fragments at the 1st IP and 1st MCP joints. Electronically Signed   By: Claudie Revering M.D.   On: 07/13/2020 17:50   DG Finger Thumb Right  Result Date: 07/13/2020 CLINICAL DATA:  [place].  Injured right thumb. EXAM: RIGHT THUMB 2+V COMPARISON:  None. FINDINGS: Dorsal and radial dislocation of the distal phalanx of the thumb. Associated small avulsion type fractures are noted. There is also a  avulsion fracture involving the radial aspect of the proximal phalanx at the MCP joint. IMPRESSION: 1. Dislocation of the distal phalanx of the thumb with small avulsion type fractures. 2. Avulsion fracture involving the radial aspect of the proximal phalanx at the MCP joint. Electronically Signed   By: Marijo Sanes M.D.   On: 07/13/2020 16:18   ECHOCARDIOGRAM COMPLETE  Result Date: 07/14/2020    ECHOCARDIOGRAM REPORT   Patient Name:   Courtney Lynn Date of Exam: 07/14/2020 Medical Rec #:  [id number]     Height:       61.0 in Accession #:    [id number]    Weight:       185.0 lb Date of Birth:  Sep 27, 1940     BSA:          1.827 m Patient Age:    80 years      BP:           180/58 mmHg Patient Gender: F             HR:           68 bpm. Exam  Location:  [facility] Procedure: 2D Echo, Color Doppler and Cardiac Doppler Indications:     R55 Syncope  History:         Patient has no prior history of Echocardiogram examinations.                  Risk Factors:Hypertension and Sleep Apnea.  Sonographer:     Charmayne Sheer RDCS (AE) Referring Phys:  [place] Diagnosing Phys: Bartholome Bill MD  Sonographer Comments: Suboptimal parasternal window. Image acquisition challenging due to uncooperative patient. IMPRESSIONS  1. Left ventricular ejection fraction, by estimation, is 55 to 60%. Left ventricular ejection fraction by PLAX is 66 %. The left ventricle has normal function. The left ventricle has no regional wall motion abnormalities. Left ventricular diastolic parameters were normal.  2. Right ventricular systolic function is normal. The right ventricular size is normal.  3. The mitral valve was not well visualized. Trivial mitral valve regurgitation.  4. The aortic valve was not well visualized. Aortic valve regurgitation is not visualized. FINDINGS  Left Ventricle: Left ventricular ejection fraction, by estimation, is 55 to 60%. Left ventricular ejection fraction by PLAX is 66 %. The left ventricle has normal function. The left ventricle has no regional wall motion abnormalities. The left ventricular internal cavity size was normal in size. There is borderline left ventricular hypertrophy. Left ventricular diastolic parameters were normal. Right Ventricle: The right ventricular size is normal. No increase in right ventricular wall thickness. Right ventricular systolic function is normal. Left Atrium: Left atrial size was normal in size. Right Atrium: Right atrial size was normal in size. Pericardium: There is no evidence of pericardial effusion. Mitral Valve: The mitral valve was not well visualized. Trivial mitral valve regurgitation. MV peak gradient, 8.0 mmHg. The mean mitral valve gradient is 3.0 mmHg. Tricuspid Valve: The tricuspid valve is not well  visualized. Tricuspid valve regurgitation is trivial. Aortic Valve: The aortic valve was not well visualized. Aortic valve regurgitation is not visualized. Aortic valve mean gradient measures 9.0 mmHg. Aortic valve peak gradient measures 15.5 mmHg. Aortic valve area, by VTI measures 2.72 cm. Pulmonic Valve: The pulmonic valve was not assessed. Pulmonic valve regurgitation is not visualized. Aorta: The aortic root was not well visualized. IAS/Shunts: The interatrial septum was not assessed.  LEFT VENTRICLE PLAX 2D LV EF:  Left            Diastology                ventricular     LV e' medial:    8.27 cm/s                ejection        LV E/e' medial:  12.7                fraction by     LV e' lateral:   11.00 cm/s                PLAX is 66      LV E/e' lateral: 9.5                %. LVIDd:         4.40 cm LVIDs:         2.80 cm LV PW:         1.10 cm LV IVS:        0.80 cm LVOT diam:     1.90 cm LV SV:         125 LV SV Index:   68 LVOT Area:     2.84 cm  RIGHT VENTRICLE RV Basal diam:  4.00 cm LEFT ATRIUM             Index       RIGHT ATRIUM           Index LA diam:        4.00 cm 2.19 cm/m  RA Area:     13.90 cm LA Vol (A2C):   45.0 ml 24.63 ml/m RA Volume:   33.80 ml  18.50 ml/m LA Vol (A4C):   41.4 ml 22.66 ml/m LA Biplane Vol: 43.6 ml 23.86 ml/m  AORTIC VALVE                    PULMONIC VALVE AV Area (Vmax):    2.79 cm     PV Vmax:       1.23 m/s AV Area (Vmean):   2.67 cm     PV Vmean:      89.700 cm/s AV Area (VTI):     2.72 cm     PV VTI:        0.261 m AV Vmax:           197.00 cm/s  PV Peak grad:  6.1 mmHg AV Vmean:          141.000 cm/s PV Mean grad:  4.0 mmHg AV VTI:            0.460 m AV Peak Grad:      15.5 mmHg AV Mean Grad:      9.0 mmHg LVOT Vmax:         194.00 cm/s LVOT Vmean:        133.000 cm/s LVOT VTI:          0.441 m LVOT/AV VTI ratio: 0.96  AORTA Ao Root diam: 2.60 cm MITRAL VALVE MV Area (PHT): 3.81 cm     SHUNTS MV Area VTI:   3.16 cm     Systemic VTI:  0.44 m MV Peak  grad:  8.0 mmHg     Systemic Diam: 1.90 cm MV Mean grad:  3.0 mmHg MV Vmax:       1.41 m/s MV Vmean:      79.8 cm/s MV Decel Time:  199 msec MV E velocity: 105.00 cm/s MV A velocity: 90.40 cm/s MV E/A ratio:  1.16 Bartholome Bill MD Electronically signed by Bartholome Bill MD Signature Date/Time: 07/14/2020/11:04:32 AM    Final       Subjective: Patient has no complaints.  No headache, no confusion, no seizure.  No focal weakness or numbness.  No chest pain or dyspnea.  No leg swelling.  Discharge Exam: Vitals:   07/14/20 0800 07/14/20 0800  BP: (!) 171/53 (!) 180/58  Pulse: 63 63  Resp: 17   SpO2: 96%    Vitals:   07/14/20 0600 07/14/20 0757 07/14/20 0800 07/14/20 0800  BP: (!) 172/75  (!) 171/53 (!) 180/58  Pulse: 76  63 63  Resp: 18  17   SpO2: 98%  96%   Weight:  83.9 kg    Height:  5\' 1"  (1.549 m)      General: Pt is alert, awake, not in acute distress sitting in cot in the ER. Cardiovascular: RRR, nl S1-S2, no murmurs appreciated.   No LE edema.   Respiratory: Normal respiratory rate and rhythm.  CTAB without rales or wheezes. Abdominal: Abdomen soft and non-tender.  No distension or HSM.   Neuro/Psych: Strength symmetric in upper and lower extremities.  Judgment and insight appear impaired by dementia, oriented only to self.   The results of significant diagnostics from this hospitalization (including imaging, microbiology, ancillary and laboratory) are listed below for reference.     Microbiology: Recent Results (from the past 240 hour(s))  Resp Panel by RT-PCR (Flu A&B, Covid) Nasopharyngeal Swab     Status: None   Collection Time: 07/13/20  4:29 PM   Specimen: Nasopharyngeal Swab; Nasopharyngeal(NP) swabs in vial transport medium  Result Value Ref Range Status   SARS Coronavirus 2 by RT PCR NEGATIVE NEGATIVE Final    Comment: (NOTE) SARS-CoV-2 target nucleic acids are NOT DETECTED.  The SARS-CoV-2 RNA is generally detectable in upper respiratory specimens during the  acute phase of infection. The lowest concentration of SARS-CoV-2 viral copies this assay can detect is 138 copies/mL. A negative result does not preclude SARS-Cov-2 infection and should not be used as the sole basis for treatment or other patient management decisions. A negative result may occur with  improper specimen collection/handling, submission of specimen other than nasopharyngeal swab, presence of viral mutation(s) within the areas targeted by this assay, and inadequate number of viral copies(<138 copies/mL). A negative result must be combined with clinical observations, patient history, and epidemiological information. The expected result is Negative.  Fact Sheet for Patients:  EntrepreneurPulse.com.au  Fact Sheet for Healthcare Providers:  IncredibleEmployment.be  This test is no t yet approved or cleared by the [place] FDA and  has been authorized for detection and/or diagnosis of SARS-CoV-2 by FDA under an Emergency Use Authorization (EUA). This EUA will remain  in effect (meaning this test can be used) for the duration of the COVID-19 declaration under Section 564(b)(1) of the Act, 21 U.S.C.section 360bbb-3(b)(1), unless the authorization is terminated  or revoked sooner.       Influenza A by PCR NEGATIVE NEGATIVE Final   Influenza B by PCR NEGATIVE NEGATIVE Final    Comment: (NOTE) The Xpert Xpress SARS-CoV-2/FLU/RSV plus assay is intended as an aid in the diagnosis of influenza from Nasopharyngeal swab specimens and should not be used as a sole basis for treatment. Nasal washings and aspirates are unacceptable for Xpert Xpress SARS-CoV-2/FLU/RSV testing.  Fact Sheet for Patients: EntrepreneurPulse.com.au  Fact Sheet  for Healthcare Providers: IncredibleEmployment.be  This test is not yet approved or cleared by the [place] and has been authorized for detection and/or  diagnosis of SARS-CoV-2 by FDA under an Emergency Use Authorization (EUA). This EUA will remain in effect (meaning this test can be used) for the duration of the COVID-19 declaration under Section 564(b)(1) of the Act, 21 U.S.C. section 360bbb-3(b)(1), unless the authorization is terminated or revoked.  Performed at [facility], [address] [place], [place] [address]      Labs: BNP (last 3 results) No results for input(s): BNP in the last 8760 hours. Basic Metabolic Panel: Recent Labs  Lab 07/13/20 1430 07/13/20 1651 07/14/20 0609  NA 130*  --  135  K 4.4  --  3.8  CL 102  --  104  CO2 20*  --  26  GLUCOSE 91  --  108*  BUN 35*  --  24*  CREATININE 1.06*  --  0.70  CALCIUM 8.8*  --  8.8*  MG  --  1.9 2.0  PHOS  --  3.4 3.1   Liver Function Tests: Recent Labs  Lab 07/13/20 1430 07/14/20 0609  AST 23 30  ALT 19 20  ALKPHOS 47 51  BILITOT 0.7 0.9  PROT 6.2* 6.6  ALBUMIN 3.6 3.7   Recent Labs  Lab 07/13/20 1430  LIPASE 35   Recent Labs  Lab 07/13/20 1956  AMMONIA 14   CBC: Recent Labs  Lab 07/13/20 1430 07/14/20 0609  WBC 9.3 10.7*  NEUTROABS 6.5 8.8*  HGB 11.1* 12.3  HCT 32.3* 34.3*  MCV 93.4 92.2  PLT 300 328   Cardiac Enzymes: Recent Labs  Lab 07/13/20 1651  CKTOTAL 176   BNP: Invalid input(s): POCBNP CBG: Recent Labs  Lab 07/13/20 2024  GLUCAP 148*   D-Dimer No results for input(s): DDIMER in the last 72 hours. Hgb A1c No results for input(s): HGBA1C in the last 72 hours. Lipid Profile Recent Labs    07/14/20 0609  CHOL 227*  HDL 61  LDLCALC 158*  TRIG 38  CHOLHDL 3.7   Thyroid function studies Recent Labs    07/14/20 0609  TSH 0.754   Anemia work up Recent Labs    07/13/20 1430 07/13/20 1629 07/14/20 0609  VITAMINB12  --   --  1,318*  FOLATE  --   --  30.6  FERRITIN  --  70  --   TIBC  --  347  --   IRON  --  72  --   RETICCTPCT 2.6  --   --    Urinalysis    Component Value Date/Time    COLORURINE YELLOW (A) 07/13/2020 1641   APPEARANCEUR CLEAR (A) 07/13/2020 1641   LABSPEC 1.015 07/13/2020 1641   PHURINE 7.0 07/13/2020 1641   GLUCOSEU NEGATIVE 07/13/2020 1641   HGBUR NEGATIVE 07/13/2020 1641   BILIRUBINUR NEGATIVE 07/13/2020 1641   KETONESUR NEGATIVE 07/13/2020 1641   PROTEINUR NEGATIVE 07/13/2020 1641   NITRITE NEGATIVE 07/13/2020 1641   LEUKOCYTESUR NEGATIVE 07/13/2020 1641   Sepsis Labs Invalid input(s): PROCALCITONIN,  WBC,  LACTICIDVEN Microbiology Recent Results (from the past 240 hour(s))  Resp Panel by RT-PCR (Flu A&B, Covid) Nasopharyngeal Swab     Status: None   Collection Time: 07/13/20  4:29 PM   Specimen: Nasopharyngeal Swab; Nasopharyngeal(NP) swabs in vial transport medium  Result Value Ref Range Status   SARS Coronavirus 2 by RT PCR NEGATIVE NEGATIVE Final    Comment: (NOTE)  SARS-CoV-2 target nucleic acids are NOT DETECTED.  The SARS-CoV-2 RNA is generally detectable in upper respiratory specimens during the acute phase of infection. The lowest concentration of SARS-CoV-2 viral copies this assay can detect is 138 copies/mL. A negative result does not preclude SARS-Cov-2 infection and should not be used as the sole basis for treatment or other patient management decisions. A negative result may occur with  improper specimen collection/handling, submission of specimen other than nasopharyngeal swab, presence of viral mutation(s) within the areas targeted by this assay, and inadequate number of viral copies(<138 copies/mL). A negative result must be combined with clinical observations, patient history, and epidemiological information. The expected result is Negative.  Fact Sheet for Patients:  EntrepreneurPulse.com.au  Fact Sheet for Healthcare Providers:  IncredibleEmployment.be  This test is no t yet approved or cleared by the [place] FDA and  has been authorized for detection and/or diagnosis of  SARS-CoV-2 by FDA under an Emergency Use Authorization (EUA). This EUA will remain  in effect (meaning this test can be used) for the duration of the COVID-19 declaration under Section 564(b)(1) of the Act, 21 U.S.C.section 360bbb-3(b)(1), unless the authorization is terminated  or revoked sooner.       Influenza A by PCR NEGATIVE NEGATIVE Final   Influenza B by PCR NEGATIVE NEGATIVE Final    Comment: (NOTE) The Xpert Xpress SARS-CoV-2/FLU/RSV plus assay is intended as an aid in the diagnosis of influenza from Nasopharyngeal swab specimens and should not be used as a sole basis for treatment. Nasal washings and aspirates are unacceptable for Xpert Xpress SARS-CoV-2/FLU/RSV testing.  Fact Sheet for Patients: EntrepreneurPulse.com.au  Fact Sheet for Healthcare Providers: IncredibleEmployment.be  This test is not yet approved or cleared by the [place] FDA and has been authorized for detection and/or diagnosis of SARS-CoV-2 by FDA under an Emergency Use Authorization (EUA). This EUA will remain in effect (meaning this test can be used) for the duration of the COVID-19 declaration under Section 564(b)(1) of the Act, 21 U.S.C. section 360bbb-3(b)(1), unless the authorization is terminated or revoked.  Performed at [facility], [address] [place], [place] [address]      Time coordinating discharge: 45 minutes The [place] controlled substances registry was reviewed for this patient prior to filling the <5 days supply controlled substances script.         SIGNED:   Edwin Dada, MD  [facility] 07/14/2020, 2:38 PM

## 2020-07-14 NOTE — Evaluation (Signed)
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  Pt had a dislocation of her right thumb which was reduced.  Her head CT showed a small amount of intracranial bleed- per neurology, no further imaging or treatment warranted at this time.  Pt also found with bradycardia, currently being monitored.  Clinical Impression  Pt is a pleasantly confused 80 year old female who was admitted for fall. Pt performs bed mobility with mod I and transfers/ambulation with cga and IV pole (simulating AD). Pt demonstrates deficits with cognition (baseline), balance/mobility. Pt reports she is unsure what caused her falls and can't recollect events prior. Poor historian. Able to tolerate evaluation well with no HA or complaints of pain. Appears to be close to baseline level. Pt is falls risk and recommend continue with RW at ALF. Would benefit from skilled PT to address above deficits and promote optimal return to PLOF. Recommend transition to [place] upon discharge from acute hospitalization.     Follow Up Recommendations Home health PT (return to ALF)    Equipment Recommendations  None recommended by PT    Recommendations for Other Services       Precautions / Restrictions Precautions Precautions: Fall Required Braces or Orthoses: Splint/Cast Splint/Cast: R thumb Restrictions Weight Bearing Restrictions: Yes RUE Weight Bearing: Non weight bearing      Mobility  Bed Mobility Overal bed mobility: Modified Independent             General bed mobility comments: able to sit at EOB, however oftens questions the reason for getting OOB, taking  extended time    Transfers Overall transfer level: Needs assistance Equipment used: 1 person hand held assist Transfers: Sit to/from Stand Sit to Stand: Min guard         General transfer comment: needs slight assist due to bed elevated and unable to reach floor. Utilized step stool  Ambulation/Gait Ambulation/Gait assistance: Counsellor (Feet): 100 Feet Assistive device: IV Pole Gait Pattern/deviations: Step-through pattern     General Gait Details: no walker availabe. utilized IV pole for assistance. Able to perform reciprocal gait pattern and carry conversation during ambulation. Easily distracted. HR WNL with exertion  Stairs            Wheelchair Mobility    Modified Rankin (Stroke Patients Only)       Balance Overall balance assessment: Needs assistance Sitting-balance support: Bilateral upper extremity supported Sitting balance-Leahy Scale: Fair     Standing balance support: Bilateral upper extremity supported Standing balance-Leahy Scale: Fair                               Pertinent Vitals/Pain Pain Assessment: No/denies pain Faces Pain Scale: Hurts even more Pain Location: R thumb, pt did not rate pain Pain Descriptors / Indicators: Grimacing;Guarding;Moaning;Restless Pain Intervention(s): Limited activity within patient's tolerance;Monitored during session    [place] expects to be discharged to:: Assisted living               Home Equipment: Walker - 4 wheels;Shower seat - built in;Cane - single point Additional Comments: [facility] [employer]- pt lives on 2nd floor of  ALF, but is able to utilize elevator    Prior Function Level of Independence: Needs assistance   Gait / Transfers Assistance Needed: Pt is mod I with use of rollator  ADL's / Homemaking Assistance Needed: Pt reports being able to dress/bathe independently, receives assistance from ALF for meals and household maintenance.   Pt reports managing her medications on her own.  Comments: Pt does not drive, she spends her days playing on her computer, reading, and doing Sudoku puzzles.  She reports she swims 3x/week, and typically showers at the pool afterwards.  Endorses 1 additional fall in past year prior to fall leading to ED visit.     Hand Dominance   Dominant Hand: Right    Extremity/Trunk Assessment   Upper Extremity Assessment Upper Extremity Assessment: Defer to OT evaluation RUE Deficits / Details: R thumb immobilized RUE: Unable to fully assess due to immobilization    Lower Extremity Assessment Lower Extremity Assessment: Generalized weakness (L LE grossly 4/5; R LE grossly 5/5)       Communication   Communication: HOH  Cognition Arousal/Alertness: Awake/alert Behavior During Therapy: WFL for tasks assessed/performed Overall Cognitive Status: History of cognitive impairments - at baseline                                 General Comments: pt oriented x 1. Follows commands but easily becomes distracted and forget task. Unable to recall events leading up to ED admission or fall at ALF      General Comments General comments (skin integrity, edema, etc.): HR = 60s, SpO2 = 100% at rest    Exercises Other Exercises Other Exercises: provided education re: OT role and plan of care, fall and safety precautions, self care, bed mobility   Assessment/Plan    PT Assessment Patient needs continued PT services  PT Problem List Decreased strength;Decreased activity tolerance;Decreased balance;Decreased mobility;Decreased cognition       PT Treatment Interventions Gait training;DME instruction;Therapeutic exercise;Balance training    PT Goals (Current goals can be found in the Care Plan section)  Acute Rehab PT Goals Patient Stated Goal: to go home PT Goal Formulation: With patient Time For Goal Achievement: 07/28/20 Potential to Achieve Goals: Good    Frequency Min 2X/week    Barriers to discharge        Co-evaluation               AM-PAC PT "6 Clicks" Mobility  Outcome Measure Help needed turning from your back to your side while in a flat bed without using bedrails?: None Help needed moving from lying on your back to sitting on the side of a flat bed without using bedrails?: None Help needed moving to and from a bed to a chair (including a wheelchair)?: A Little Help needed standing up from a chair using your arms (e.g., wheelchair or bedside chair)?: A Little Help needed to walk in hospital room?: A Little Help needed climbing 3-5 steps with a railing? : A Little 6 Click Score: 20    End of Session Equipment Utilized During Treatment: Gait belt Activity Tolerance: Patient tolerated treatment well Patient left: in bed Nurse Communication: Mobility status PT Visit Diagnosis: Unsteadiness on feet (R26.81);Muscle weakness (generalized) (M62.81);History of falling (Z91.81)    Time: 8003-4917 PT Time Calculation (min) (ACUTE ONLY): 17 min   Charges:   PT Evaluation $PT Eval Low Complexity: 1 Low  Greggory Stallion, PT, DPT [phone]   Dorlisa Savino 07/14/2020, 3:22 PM

## 2020-07-14 NOTE — ED Notes (Signed)
Pt noted to be in bed resting with eyes shut. No diaphoresis, shaking, or N/V noted. Will continue to monitor Pt for signs of withdrawal.

## 2020-07-14 NOTE — ED Notes (Signed)
Informed RN bed assigned 

## 2020-07-14 NOTE — Consult Note (Signed)
Cardiology Consultation Note    Patient ID: Courtney Lynn, MRN: [id number], DOB/AGE: 1940-04-13 80 y.o. Admit date: 07/13/2020   Date of Consult: 07/14/2020 Primary Physician: Tracie Harrier, MD Primary Cardiologist: Dr. Saralyn Pilar  Chief Complaint: unwitnessed fall with right thumb pain. Reason for Consultation: Fall/bradycardia Requesting MD: Dr. Loleta Books  HPI: Courtney Lynn is a 80 y.o. female with history of hypertension, breast carcinoma noted in 2017, chronic bradycardia, mild dementia, mild pulmonary hypertension, o obstructive sleep apnea on CPAP who was admitted after an unwitnessed fall while walking outside at the [facility] facility.  She had a headache.  EMS noted junctional rhythm in the 30s.  Dislocation of the right with reduced.  Hyponatremic with a sodium of 130.  Creatinine is 1.06.  CT of the head revealed trace amount of layering hyperdensity within the posterior horn of the left lateral ventricle felt to possibly intraventricular bleed.  Seen by neurosurgery who did not feel she needed any further treatment or imaging.  EKG revealed sinus rhythm at a rate of 65.  There were no ischemic changes or arrhythmias noted.  Heart rate is in the 60s and 70s in the ER.  No pauses.  She ruled out for myocardial infarction.  Echo shows normal LV function with no significant valvular abnormalities.  Was on metoprolol as outpatient.  She is not a very good historian but is not having any complaints.  No significant bradycardia.  No pauses.  No significant tachycardia.  Hemodynamically stable. Past Medical History:  Diagnosis Date   Anemia    Arthritis    Body mass index (bmi) 37.0-37.9, adult    Breast cancer ([place]) 01/02/2016   right breast/radiation   Cancer ([place]) 2017   BREAST   Chronic insomnia    Complication of anesthesia    has difficulty last surgery intubating   Depression    Difficult intubation    WITH TKR   Dizziness    Dysrhythmia    PVC'S,BRADYCARDIA   GERD  (gastroesophageal reflux disease)    History of chicken pox    Hypertension    Hyponatremia    Intermittent vertigo    Irregular heart beat    Memory difficulties    Mild mitral regurgitation    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HCC)    MVP (mitral valve prolapse)    Orthopnea    Personal history of radiation therapy 2017   Pre-diabetes    PVC's (premature ventricular contractions)    Reflux    Sleep apnea    CPAP      Surgical History:  Past Surgical History:  Procedure Laterality Date   APPENDECTOMY     BREAST BIOPSY Right 01/02/2016   PARTIAL MASTECTOMY- [facility] and [facility]   BREAST LUMPECTOMY Right 2017   CATARACT EXTRACTION W/PHACO Right 09/14/2016   Procedure: CATARACT EXTRACTION PHACO AND INTRAOCULAR LENS PLACEMENT ([place]);  Surgeon: Birder Robson, MD;  Location: [facility] ORS;  Service: Ophthalmology;  Laterality: Right;  [place] 00:32 AP% 19.5 CDE 6.28 Fluid pack lot # [id number]   CATARACT EXTRACTION W/PHACO Left 10/05/2016   Procedure: CATARACT EXTRACTION PHACO AND INTRAOCULAR LENS PLACEMENT ([facility]);  Surgeon: Birder Robson, MD;  Location: [facility] ORS;  Service: Ophthalmology;  Laterality: Left;  [place] 00:46.9 AP% 16.0 CDE 7.48 Fluid Pack lot # [id number]   CHOLECYSTECTOMY     COLONOSCOPY WITH PROPOFOL N/A 08/24/2016   Procedure: COLONOSCOPY WITH PROPOFOL;  Surgeon: Lollie Sails, MD;  Location: [facility] ENDOSCOPY;  Service: Endoscopy;  Laterality: N/A;  COLONOSCOPY WITH PROPOFOL N/A 07/12/2018   Procedure: COLONOSCOPY WITH PROPOFOL;  Surgeon: Toledo, Benay Pike, MD;  Location: [facility] ENDOSCOPY;  Service: Gastroenterology;  Laterality: N/A;   dental implants     FOOT SURGERY Right    FRACTURE SURGERY     KNEE CAP   HEMORRHOID SURGERY N/A 04/19/2019   Procedure: HEMORRHOIDECTOMY;  Surgeon: Benjamine Sprague, DO;  Location: [facility] ORS;  Service: General;  Laterality: N/A;   JOINT REPLACEMENT Right 2011   KNEE   PARTIAL MASTECTOMY WITH NEEDLE LOCALIZATION Right 01/02/2016   Procedure: PARTIAL  MASTECTOMY WITH NEEDLE LOCALIZATION;  Surgeon: Leonie Green, MD;  Location: [facility] ORS;  Service: General;  Laterality: Right;   PATELLA FRACTURE SURGERY     REPLACEMENT TOTAL KNEE Right    SENTINEL NODE BIOPSY Right 01/02/2016   Procedure: SENTINEL NODE BIOPSY;  Surgeon: Leonie Green, MD;  Location: [facility] ORS;  Service: General;  Laterality: Right;   TONSILLECTOMY       Home Meds: Prior to Admission medications   Medication Sig Start Date End Date Taking? Authorizing Provider  acetaminophen (TYLENOL) 500 MG tablet Take 1,000 mg by mouth at bedtime.   Yes [provider]  citalopram (CELEXA) 10 MG tablet Take 10 mg by mouth every other day.   Yes [provider]  docusate sodium (COLACE) 100 MG capsule Take 100 mg by mouth daily.    Yes [provider]  donepezil (ARICEPT) 10 MG tablet Take 10 mg by mouth at bedtime.   Yes [provider]  felodipine (PLENDIL) 5 MG 24 hr tablet Take 5 mg by mouth daily.   Yes [provider]  gabapentin (NEURONTIN) 100 MG capsule Take 200 mg by mouth at bedtime.   Yes [provider]  hydrALAZINE (APRESOLINE) 50 MG tablet Take 50 mg by mouth 2 (two) times daily. 03/21/19  Yes [provider]  iron polysaccharides (NIFEREX) 150 MG capsule Take 150 mg by mouth daily.   Yes [provider]  lactase (LACTAID) 3000 units tablet Take 1 tablet by mouth daily.   Yes [provider]  meloxicam (MOBIC) 15 MG tablet Take 15 mg by mouth daily.   Yes [provider]  methocarbamol (ROBAXIN) 500 MG tablet Take 500 mg by mouth at bedtime.   Yes [provider]  metoprolol succinate (TOPROL-XL) 25 MG 24 hr tablet Take 25 mg by mouth daily.   Yes [provider]  Multiple Vitamin (MULTIVITAMIN) tablet Take 1 tablet by mouth 2 (two) times daily.   Yes [provider]  olmesartan (BENICAR) 40 MG tablet Take 40 mg by mouth daily.  03/25/18  Yes [provider]  omeprazole (PRILOSEC) 20 MG capsule Take 20 mg by mouth daily.   Yes [provider]  OVER THE COUNTER MEDICATION Take 1 capsule by mouth daily. **Shaklee OmegaGuard**   Yes [provider]  OVER THE COUNTER MEDICATION Take 1 capsule by mouth 2 (two) times daily. **Omega Q Plus**   Yes [provider]  OVER THE COUNTER MEDICATION Take 1 tablet by mouth daily. **OptiBiotic**   Yes [provider]  OVER THE COUNTER MEDICATION Take 2 capsules by mouth daily. **Shaklee OsteoMatrix**   Yes [provider]  OVER THE COUNTER MEDICATION Take 1 capsule by mouth daily. **LifeVantage Protandim**   Yes [provider]  OVER THE COUNTER MEDICATION Take 2 tablets by mouth daily with lunch. **Shaklee Blood Pressure**   Yes [provider]  Wheat Dextrin (  BENEFIBER) CHEW Chew 1 tablet by mouth daily.   Yes [provider]    Inpatient Medications:   citalopram  10 mg Oral QODAY   donepezil  5 mg Oral QHS   doxycycline  100 mg Oral Q12H   hydrALAZINE  50 mg Oral BID   irbesartan  300 mg Oral Daily   pantoprazole  40 mg Oral Daily     Allergies:  Allergies  Allergen Reactions   Cleocin [Clindamycin Hcl] Swelling, Rash and Other (See Comments)    Tongue, lip swelling Tongue, lip swelling Tongue, lip swelling   Pen Vk [Penicillin V] Swelling, Rash and Other (See Comments)    Tongue, lip swelling Funny feeling on the tongue  Has patient had a PCN reaction causing immediate rash, facial/tongue/throat swelling, SOB or lightheadedness with hypotension:Yes Has patient had a PCN reaction causing severe rash involving mucus membranes or skin necrosis:No Has patient had a PCN reaction that required hospitalization:No Has patient had a PCN reaction occurring within the last 10 years:No If all of the above answers are "NO", then may proceed with Cephalosporin use.     Social History   Socioeconomic History   Marital  status: Widowed    Spouse name: Not on file   Number of children: Not on file   Years of education: Not on file   Highest education level: Not on file  Occupational History   Not on file  Tobacco Use   Smoking status: Former    Pack years: 0.00   Smokeless tobacco: Never   Tobacco comments:    Quit 1969  Vaping Use   Vaping Use: Never used  Substance and Sexual Activity   Alcohol use: Yes    Alcohol/week: 7.0 standard drinks    Types: 7 Glasses of wine per week    Comment: OCCASIONALLY   Drug use: No   Sexual activity: Not on file  Other Topics Concern   Not on file  Social History Narrative   Not on file   Social Determinants of Health   Financial Resource Strain: Not on file  Food Insecurity: Not on file  Transportation Needs: Not on file  Physical Activity: Not on file  Stress: Not on file  Social Connections: Not on file  Intimate Partner Violence: Not on file     Family History  Problem Relation Age of Onset   GI Bleed Mother    Diabetes Father    Congestive Heart Failure Father    Breast cancer Neg Hx      Review of Systems: A 12-system review of systems was performed and is negative except as noted in the HPI.  Labs: Recent Labs    07/13/20 1651  CKTOTAL 176   Lab Results  Component Value Date   WBC 10.7 (H) 07/14/2020   HGB 12.3 07/14/2020   HCT 34.3 (L) 07/14/2020   MCV 92.2 07/14/2020   PLT 328 07/14/2020    Recent Labs  Lab 07/14/20 0609  NA 135  K 3.8  CL 104  CO2 26  BUN 24*  CREATININE 0.70  CALCIUM 8.8*  PROT 6.6  BILITOT 0.9  ALKPHOS 51  ALT 20  AST 30  GLUCOSE 108*   Lab Results  Component Value Date   CHOL 227 (H) 07/14/2020   HDL 61 07/14/2020   LDLCALC 158 (H) 07/14/2020   TRIG 38 07/14/2020   No results found for: DDIMER  Radiology/Studies:  CT HEAD WO CONTRAST  Result Date: 07/13/2020 CLINICAL DATA:  Unwitnessed fall.  Follow-up earlier head CT. EXAM: CT HEAD WITHOUT CONTRAST TECHNIQUE: Contiguous axial  images were obtained from the base of the skull through the vertex without intravenous contrast. COMPARISON:  Earlier head CT, same date. FINDINGS: Brain: Stable age related cerebral atrophy, ventriculomegaly and periventricular white matter disease. Stable small amount of layering high attenuation material in the occipital horn of the left lateral ventricle likely a small amount of intraventricular hemorrhage. I do not see any other areas of intracranial hemorrhage. No hemorrhagic contusions or subarachnoid hemorrhage. No mass lesions. The brainstem and cerebellum are normal. Vascular: Vascular calcifications but no aneurysm or hyperdense vessels. Skull: No skull fracture or bone lesions. Sinuses/Orbits: The paranasal sinuses and mastoid air cells are clear. There is a polyp or mucous retention cyst in the left maxillary sinus. Other: No scalp lesions or scalp hematoma. IMPRESSION: 1. Stable small amount of layering high attenuation material in the occipital horn of the left lateral ventricle likely a small amount of intraventricular hemorrhage. 2. Stable age related cerebral atrophy, ventriculomegaly and periventricular white matter disease. Electronically Signed   By: Marijo Sanes M.D.   On: 07/13/2020 20:36   CT Head Wo Contrast  Addendum Date: 07/13/2020   ADDENDUM REPORT: 07/13/2020 16:16 ADDENDUM: These results were called by telephone at the time of interpretation on 07/13/2020 at 4:15 pm to provider Dr. Joni Fears, who verbally acknowledged these results. Electronically Signed   By: Valentino Saxon MD   On: 07/13/2020 16:16   Result Date: 07/13/2020 CLINICAL DATA:  Fall EXAM: CT HEAD WITHOUT CONTRAST CT CERVICAL SPINE WITHOUT CONTRAST TECHNIQUE: Multidetector CT imaging of the head and cervical spine was performed following the standard protocol without intravenous contrast. Multiplanar CT image reconstructions of the cervical spine were also generated. COMPARISON:  January 31, 2020, February 05, 2020  FINDINGS: CT HEAD FINDINGS Brain: There is trace high density material layering within the posterior horn of the LEFT lateral ventricle (series 5, image 35; series 2, image 14). This is new since prior. No evidence of acute infarction or hydrocephalus. Revisualization advanced atrophy of the RIGHT greater than LEFT temporal lobes. Periventricular white matter hypodensities consistent with sequela of chronic microvascular ischemic disease. Vascular: Vascular calcifications. Skull: Normal. Negative for fracture or focal lesion. Sinuses/Orbits: Mucous retention cyst of the LEFT maxillary sinus. Other: None. CT CERVICAL SPINE FINDINGS Alignment: Unchanged minimal anterolisthesis of C5-6 and C6-7. Skull base and vertebrae: No acute fracture. Unchanged minimal wedging of C7 vertebral body. Soft tissues and spinal canal: No prevertebral fluid or swelling. No visible canal hematoma. Disc levels: Mild degenerative changes of C3-4 and C4-5. Osseous ankylosis of the LEFT facets of C4-5. Multilevel facet arthropathy results in multilevel osseous neuroforaminal narrowing most pronounced at LEFT C4-5. Upper chest: Negative. Other: Atherosclerotic calcifications of the carotid arteries. There is sclerotic calcifications of the aorta. IMPRESSION: 1. There is a trace amount of layering hyperdensity within the posterior horn of the LEFT lateral ventricle. This could reflect a small amount of intraventricular blood products. Recommend short-term follow-up. 2. No acute fracture or static subluxation of the cervical spine. Unchanged mild multilevel anterolisthesis, degenerative in etiology. PRA system was activated at 07/13/2020 at 4:05 pm. An addendum will be issued upon telephonic communication with ordering provider. Aortic Atherosclerosis (ICD10-I70.0). Electronically Signed: By: Valentino Saxon MD On: 07/13/2020 16:12   CT Cervical Spine Wo Contrast  Addendum Date: 07/13/2020   ADDENDUM REPORT: 07/13/2020 16:16 ADDENDUM:  These results were called by telephone at the time of interpretation on  07/13/2020 at 4:15 pm to provider Dr. Joni Fears, who verbally acknowledged these results. Electronically Signed   By: Valentino Saxon MD   On: 07/13/2020 16:16   Result Date: 07/13/2020 CLINICAL DATA:  Fall EXAM: CT HEAD WITHOUT CONTRAST CT CERVICAL SPINE WITHOUT CONTRAST TECHNIQUE: Multidetector CT imaging of the head and cervical spine was performed following the standard protocol without intravenous contrast. Multiplanar CT image reconstructions of the cervical spine were also generated. COMPARISON:  January 31, 2020, February 05, 2020 FINDINGS: CT HEAD FINDINGS Brain: There is trace high density material layering within the posterior horn of the LEFT lateral ventricle (series 5, image 35; series 2, image 14). This is new since prior. No evidence of acute infarction or hydrocephalus. Revisualization advanced atrophy of the RIGHT greater than LEFT temporal lobes. Periventricular white matter hypodensities consistent with sequela of chronic microvascular ischemic disease. Vascular: Vascular calcifications. Skull: Normal. Negative for fracture or focal lesion. Sinuses/Orbits: Mucous retention cyst of the LEFT maxillary sinus. Other: None. CT CERVICAL SPINE FINDINGS Alignment: Unchanged minimal anterolisthesis of C5-6 and C6-7. Skull base and vertebrae: No acute fracture. Unchanged minimal wedging of C7 vertebral body. Soft tissues and spinal canal: No prevertebral fluid or swelling. No visible canal hematoma. Disc levels: Mild degenerative changes of C3-4 and C4-5. Osseous ankylosis of the LEFT facets of C4-5. Multilevel facet arthropathy results in multilevel osseous neuroforaminal narrowing most pronounced at LEFT C4-5. Upper chest: Negative. Other: Atherosclerotic calcifications of the carotid arteries. There is sclerotic calcifications of the aorta. IMPRESSION: 1. There is a trace amount of layering hyperdensity within the posterior horn  of the LEFT lateral ventricle. This could reflect a small amount of intraventricular blood products. Recommend short-term follow-up. 2. No acute fracture or static subluxation of the cervical spine. Unchanged mild multilevel anterolisthesis, degenerative in etiology. PRA system was activated at 07/13/2020 at 4:05 pm. An addendum will be issued upon telephonic communication with ordering provider. Aortic Atherosclerosis (ICD10-I70.0). Electronically Signed: By: Valentino Saxon MD On: 07/13/2020 16:12   US Carotid Bilateral  Result Date: 07/13/2020 CLINICAL DATA:  Initial evaluation for acute syncope. EXAM: BILATERAL CAROTID DUPLEX ULTRASOUND TECHNIQUE: Pearline Cables scale imaging, color Doppler and duplex ultrasound were performed of bilateral carotid and vertebral arteries in the neck. COMPARISON:  None. FINDINGS: Criteria: Quantification of carotid stenosis is based on velocity parameters that correlate the residual internal carotid diameter with NASCET-based stenosis levels, using the diameter of the distal internal carotid lumen as the denominator for stenosis measurement. The following velocity measurements were obtained: RIGHT ICA: 116/27 cm/sec CCA: 28/41 cm/sec SYSTOLIC ICA/CCA RATIO:  1.9 ECA: 91 cm/sec LEFT ICA: 131/22 cm/sec CCA: 32/44 cm/sec SYSTOLIC ICA/CCA RATIO:  2.2 ECA: 158 cm/sec RIGHT CAROTID ARTERY: Mild intimal thickening within the visualized right CCA without stenosis. Mild intimal thickening with irregular echogenic plaque about the right carotid bulb extending into the proximal right ICA. No significant elevation in peak systolic velocity to suggest hemodynamically significant stenosis. Right ICA patent distally. RIGHT VERTEBRAL ARTERY:  Antegrade. LEFT CAROTID ARTERY: Scattered intimal thickening present throughout the visualized left CCA without significant stenosis. Moderate bulky echogenic calcified plaque at the left carotid bulb extending into the proximal left ICA. Associated elevation of  the peak systolic velocity up to 010 cm/sec, suggesting a stenosis of 50-69%. Additionally, systolic ICA/CCA ratio is mildly elevated at 2.2. The partially visualized distal left ICA is tortuous. Note also made of elevated peak systolic velocity within the left ECA. LEFT VERTEBRAL ARTERY:  Antegrade. IMPRESSION: 1. Moderate bulky calcified plaque  at the left carotid bulb extending into the proximal left ICA, with associated 50-69% stenosis based on ultrasound criteria. Correlation with dedicated MRA and/or CTA could be performed for further evaluation as warranted. 2. Mild atheromatous change about the right carotid bulb/proximal right ICA, but no hemodynamically significant stenosis by ultrasound criteria. 3. Patent antegrade flow within both vertebral arteries. Electronically Signed   By: Jeannine Boga M.D.   On: 07/13/2020 21:45   DG Chest Port 1 View  Result Date: 07/13/2020 CLINICAL DATA:  Status post fall EXAM: PORTABLE CHEST 1 VIEW COMPARISON:  Chest x-ray 02/05/2020 FINDINGS: The heart size and mediastinal contours are unchanged. Aortic arch calcification. Biapical pleural/pulmonary scarring. No focal consolidation. Similar appearing coarsened interstitial markings with no overt pulmonary edema. No pleural effusion. No pneumothorax. No acute osseous abnormality. Degenerative changes of bilateral shoulders. IMPRESSION: No active disease. Electronically Signed   By: Iven Finn M.D.   On: 07/13/2020 19:42   DG Finger Thumb Right  Result Date: 07/13/2020 CLINICAL DATA:  Status post reduction of a right thumb dislocation. EXAM: RIGHT THUMB 2+V COMPARISON:  Earlier today. FINDINGS: Interval reduction of the previously demonstrated dislocation at the right 1st IP joint. Again demonstrated is a small avulsion fracture of the base of the 1st proximal phalanx at the 1st MCP joint. There are also small avulsion fracture fragments at the radial aspect of the 1st IP joint. IMPRESSION: 1. Status post  reduction of the previously seen 1st IP joint dislocation. 2. Previously described small avulsion fracture fragments at the 1st IP and 1st MCP joints. Electronically Signed   By: Claudie Revering M.D.   On: 07/13/2020 17:50   DG Finger Thumb Right  Result Date: 07/13/2020 CLINICAL DATA:  [place].  Injured right thumb. EXAM: RIGHT THUMB 2+V COMPARISON:  None. FINDINGS: Dorsal and radial dislocation of the distal phalanx of the thumb. Associated small avulsion type fractures are noted. There is also a avulsion fracture involving the radial aspect of the proximal phalanx at the MCP joint. IMPRESSION: 1. Dislocation of the distal phalanx of the thumb with small avulsion type fractures. 2. Avulsion fracture involving the radial aspect of the proximal phalanx at the MCP joint. Electronically Signed   By: Marijo Sanes M.D.   On: 07/13/2020 16:18    Wt Readings from Last 3 Encounters:  07/14/20 83.9 kg  02/05/20 83.9 kg  01/31/20 83.9 kg    EKG: Sinus rhythm with no ischemia normal intervals  Physical Exam:  Blood pressure (!) 180/58, pulse 63, resp. rate 17, height 5\' 1"  (1.549 m), weight 83.9 kg, SpO2 96 %. Body mass index is 34.96 kg/m. General: Well developed, well nourished, in no acute distress. Head: Normocephalic, atraumatic, sclera non-icteric, no xanthomas, nares are without discharge.  Neck: Negative for carotid bruits. JVD not elevated. Lungs: Clear bilaterally to auscultation without wheezes, rales, or rhonchi. Breathing is unlabored. Heart: RRR with S1 S2. No murmurs, rubs, or gallops appreciated. Abdomen: Soft, non-tender, non-distended with normoactive bowel sounds. No hepatomegaly. No rebound/guarding. No obvious abdominal masses. Msk:  Strength and tone appear normal for age. Extremities: No clubbing or cyanosis. No edema.  Distal pedal pulses are 2+ and equal bilaterally. Neuro: Alert and oriented X 3. No facial asymmetry. No focal deficit. Moves all extremities spontaneously. Psych:   Responds to questions appropriately with a normal affect.     Assessment and Plan  80 year old female with history of breast carcinoma, hypertension, chronic bradycardia without symptoms, dementia, pulmonary hypertension who was admitted after an  unwitnessed fall while walking outside at her care facility.  Had a dislocation of her right thumb which was reduced.  Her head CT showed a small amount of intracranial bleed however seen by's neurosurgery who did not think you are warranted any further imaging or treatment.  She has remained in sinus rhythm with no pauses.  Was noted to have junctional rhythm in the 30s by EMS.  Has had no further of these episodes.  She was on metoprolol succinate 25 mg daily as an outpatient.  No other medications with AV nodal effective.  1.  Bradycardia-no further significant bradycardia.  Was on metoprolol succinate as an outpatient.  Will discontinue this.  Echo showed preserved LV function with no high-grade valvular abnormalities.  No arrhythmias noted on telemetry during ER stay.  Would discontinue on her home medications with the exception of discontinuing her metoprolol succinate.  She will follow-up with her regular cardiologist, Dr. Saralyn Pilar, in 1 week.  We will place a Holter monitor at that time for further evaluation.  2.  Dementia-appears chronic  3.  Hypertension-currently on olmesartan 40 mg daily, felodipine 5 mg daily, hydralazine 50 mg twice daily.  We will continue with this.  She is somewhat hypertensive in the ER however may be secondary to the situation.  As per above, will discontinue metoprolol.  Signed, Teodoro Spray MD 07/14/2020, 10:48 AM Pager: [phone]

## 2020-07-14 NOTE — ED Notes (Signed)
This RN updated facility on Pt status. Per facility nurse, Pt is in assisted living and drinks wine with every meal. Nurse is unaware how much wine she actually drinks per day.

## 2020-07-14 NOTE — ED Notes (Signed)
Pt assisted to toilet. Pt has BM and then had  Episode of yellow vomit. Pt cleaned up and clean gown and blankets provided. Pt sitting up in bed watching TV.

## 2020-07-14 NOTE — Consult Note (Signed)
Neurosurgery-New Consultation Evaluation 07/14/2020 Courtney Lynn [id number]  Identifying Statement: Courtney Lynn is a 80 y.o. female from [place] [id number] with fall  Physician Requesting Consultation: Toy Baker, MD  History of Present Illness: Courtney Lynn is in the ED after a fall resulting in ibn a broken thumb and hip dislocation. She does not recall the fall when talking to me this morning but does have a history of dementia. As part of the evaluation, a CT of the head was obtained and showed a small amount of IVH. She does not appear to be on any anticoagulant. She does not cooperate with other questioning of symptoms.   Past Medical History:  Past Medical History:  Diagnosis Date   Anemia    Arthritis    Body mass index (bmi) 37.0-37.9, adult    Breast cancer ([place]) 01/02/2016   right breast/radiation   Cancer ([place]) 2017   BREAST   Chronic insomnia    Complication of anesthesia    has difficulty last surgery intubating   Depression    Difficult intubation    WITH TKR   Dizziness    Dysrhythmia    PVC'S,BRADYCARDIA   GERD (gastroesophageal reflux disease)    History of chicken pox    Hypertension    Hyponatremia    Intermittent vertigo    Irregular heart beat    Memory difficulties    Mild mitral regurgitation    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HCC)    MVP (mitral valve prolapse)    Orthopnea    Personal history of radiation therapy 2017   Pre-diabetes    PVC's (premature ventricular contractions)    Reflux    Sleep apnea    CPAP    Social History: Social History   Socioeconomic History   Marital status: Widowed    Spouse name: Not on file   Number of children: Not on file   Years of education: Not on file   Highest education level: Not on file  Occupational History   Not on file  Tobacco Use   Smoking status: Former    Pack years: 0.00   Smokeless tobacco: Never   Tobacco comments:    Quit 1969  Vaping Use   Vaping Use: Never used   Substance and Sexual Activity   Alcohol use: Yes    Alcohol/week: 7.0 standard drinks    Types: 7 Glasses of wine per week    Comment: OCCASIONALLY   Drug use: No   Sexual activity: Not on file  Other Topics Concern   Not on file  Social History Narrative   Not on file   Social Determinants of Health   Financial Resource Strain: Not on file  Food Insecurity: Not on file  Transportation Needs: Not on file  Physical Activity: Not on file  Stress: Not on file  Social Connections: Not on file  Intimate Partner Violence: Not on file     Family History: Family History  Problem Relation Age of Onset   GI Bleed Mother    Diabetes Father    Congestive Heart Failure Father    Breast cancer Neg Hx     Review of Systems:  Review of Systems - General ROS: Negative Psychological ROS: Negative Ophthalmic ROS: Negative ENT ROS: Negative Hematological and Lymphatic ROS: Negative  Endocrine ROS: Negative Respiratory ROS: Negative Cardiovascular ROS: Negative Gastrointestinal ROS: Negative Genito-Urinary ROS: Negative Musculoskeletal ROS: Negative Neurological ROS: Negative for headache Dermatological ROS: Negative  Physical Exam: BP (!) 172/75  Pulse 76   Resp 18   Ht 5\' 1"  (1.549 m)   Wt 90.7 kg   SpO2 98%   BMI 37.79 kg/m  Body mass index is 37.79 kg/m. Body surface area is 1.98 meters squared. General appearance: Alert, cooperative, in no acute distress Head: Normocephalic, atraumatic Eyes: Normal, open  Neurologic exam:  Mental status: alertness: alert, orientation: knew self but did not answer other questions affect: normal Speech: is clear and named 1 object but would not participate further Cranial nerves:  II: Visual fields appear full III/IV/VI: extra-ocular motions intact bilaterally V/VII:no evidence of facial droop or weakness  VIII: hearing normal Motor: Would not give strength testing Gait: not tested  Laboratory: Results for orders placed or  performed during the hospital encounter of 07/13/20  Resp Panel by RT-PCR (Flu A&B, Covid) Nasopharyngeal Swab   Specimen: Nasopharyngeal Swab; Nasopharyngeal(NP) swabs in vial transport medium  Result Value Ref Range   SARS Coronavirus 2 by RT PCR NEGATIVE NEGATIVE   Influenza A by PCR NEGATIVE NEGATIVE   Influenza B by PCR NEGATIVE NEGATIVE  Comprehensive metabolic panel  Result Value Ref Range   Sodium 130 (L) 135 - 145 mmol/L   Potassium 4.4 3.5 - 5.1 mmol/L   Chloride 102 98 - 111 mmol/L   CO2 20 (L) 22 - 32 mmol/L   Glucose, Bld 91 70 - 99 mg/dL   BUN 35 (H) 8 - 23 mg/dL   Creatinine, Ser 1.06 (H) 0.44 - 1.00 mg/dL   Calcium 8.8 (L) 8.9 - 10.3 mg/dL   Total Protein 6.2 (L) 6.5 - 8.1 g/dL   Albumin 3.6 3.5 - 5.0 g/dL   AST 23 15 - 41 U/L   ALT 19 0 - 44 U/L   Alkaline Phosphatase 47 38 - 126 U/L   Total Bilirubin 0.7 0.3 - 1.2 mg/dL   GFR, Estimated 53 (L) >60 mL/min   Anion gap 8 5 - 15  Lipase, blood  Result Value Ref Range   Lipase 35 11 - 51 U/L  CBC with Differential  Result Value Ref Range   WBC 9.3 4.0 - 10.5 K/uL   RBC 3.46 (L) 3.87 - 5.11 MIL/uL   Hemoglobin 11.1 (L) 12.0 - 15.0 g/dL   HCT 32.3 (L) 36.0 - 46.0 %   MCV 93.4 80.0 - 100.0 fL   MCH 32.1 26.0 - 34.0 pg   MCHC 34.4 30.0 - 36.0 g/dL   RDW 14.0 11.5 - 15.5 %   Platelets 300 150 - 400 K/uL   nRBC 0.0 0.0 - 0.2 %   Neutrophils Relative % 69 %   Neutro Abs 6.5 1.7 - 7.7 K/uL   Lymphocytes Relative 18 %   Lymphs Abs 1.6 0.7 - 4.0 K/uL   Monocytes Relative 9 %   Monocytes Absolute 0.8 0.1 - 1.0 K/uL   Eosinophils Relative 3 %   Eosinophils Absolute 0.3 0.0 - 0.5 K/uL   Basophils Relative 1 %   Basophils Absolute 0.1 0.0 - 0.1 K/uL   Immature Granulocytes 0 %   Abs Immature Granulocytes 0.04 0.00 - 0.07 K/uL  Urinalysis, Complete w Microscopic Urine, Clean Catch  Result Value Ref Range   Color, Urine YELLOW (A) YELLOW   APPearance CLEAR (A) CLEAR   Specific Gravity, Urine 1.015 1.005 - 1.030    pH 7.0 5.0 - 8.0   Glucose, UA NEGATIVE NEGATIVE mg/dL   Hgb urine dipstick NEGATIVE NEGATIVE   Bilirubin Urine NEGATIVE NEGATIVE   Ketones,  ur NEGATIVE NEGATIVE mg/dL   Protein, ur NEGATIVE NEGATIVE mg/dL   Nitrite NEGATIVE NEGATIVE   Leukocytes,Ua NEGATIVE NEGATIVE   RBC / HPF 0-5 0 - 5 RBC/hpf   WBC, UA 0-5 0 - 5 WBC/hpf   Bacteria, UA NONE SEEN NONE SEEN   Squamous Epithelial / LPF NONE SEEN 0 - 5  Ethanol  Result Value Ref Range   Alcohol, Ethyl (B) <10 <10 mg/dL  Protime-INR  Result Value Ref Range   Prothrombin Time 13.3 11.4 - 15.2 seconds   INR 1.0 0.8 - 1.2  Osmolality, urine  Result Value Ref Range   Osmolality, Ur 552 300 - 900 mOsm/kg  Creatinine, urine, random  Result Value Ref Range   Creatinine, Urine 60 mg/dL  Sodium, urine, random  Result Value Ref Range   Sodium, Ur 91 mmol/L  Osmolality  Result Value Ref Range   Osmolality 282 275 - 295 mOsm/kg  Ammonia  Result Value Ref Range   Ammonia 14 9 - 35 umol/L  Lactic acid, plasma  Result Value Ref Range   Lactic Acid, Venous 1.0 0.5 - 1.9 mmol/L  CBC WITH DIFFERENTIAL  Result Value Ref Range   WBC 10.7 (H) 4.0 - 10.5 K/uL   RBC 3.72 (L) 3.87 - 5.11 MIL/uL   Hemoglobin 12.3 12.0 - 15.0 g/dL   HCT 34.3 (L) 36.0 - 46.0 %   MCV 92.2 80.0 - 100.0 fL   MCH 33.1 26.0 - 34.0 pg   MCHC 35.9 30.0 - 36.0 g/dL   RDW 14.2 11.5 - 15.5 %   Platelets 328 150 - 400 K/uL   nRBC 0.0 0.0 - 0.2 %   Neutrophils Relative % 82 %   Neutro Abs 8.8 (H) 1.7 - 7.7 K/uL   Lymphocytes Relative 8 %   Lymphs Abs 0.9 0.7 - 4.0 K/uL   Monocytes Relative 8 %   Monocytes Absolute 0.9 0.1 - 1.0 K/uL   Eosinophils Relative 1 %   Eosinophils Absolute 0.1 0.0 - 0.5 K/uL   Basophils Relative 1 %   Basophils Absolute 0.1 0.0 - 0.1 K/uL   Immature Granulocytes 0 %   Abs Immature Granulocytes 0.04 0.00 - 0.07 K/uL  Reticulocytes  Result Value Ref Range   Retic Ct Pct 2.6 0.4 - 3.1 %   RBC. 3.45 (L) 3.87 - 5.11 MIL/uL   Retic Count,  Absolute 88.7 19.0 - 186.0 K/uL   Immature Retic Fract 20.3 (H) 2.3 - 15.9 %  Iron and TIBC  Result Value Ref Range   Iron 72 28 - 170 ug/dL   TIBC 347 250 - 450 ug/dL   Saturation Ratios 21 10.4 - 31.8 %   UIBC 275 ug/dL  Ferritin  Result Value Ref Range   Ferritin 70 11 - 307 ng/mL  CK  Result Value Ref Range   Total CK 176 38 - 234 U/L  Phosphorus  Result Value Ref Range   Phosphorus 3.4 2.5 - 4.6 mg/dL  Magnesium  Result Value Ref Range   Magnesium 1.9 1.7 - 2.4 mg/dL  CBG monitoring, ED  Result Value Ref Range   Glucose-Capillary 148 (H) 70 - 99 mg/dL  Troponin I (High Sensitivity)  Result Value Ref Range   Troponin I (High Sensitivity) 6 <18 ng/L  Troponin I (High Sensitivity)  Result Value Ref Range   Troponin I (High Sensitivity) 7 <18 ng/L     Imaging: CT Head: There is a trace amount of layering hyperdensity within the posterior  horn of the LEFT lateral ventricle. This could reflect a small amount of intraventricular blood products. Recommend short-term follow-up.   Impression/Plan:  Courtney Lynn is here with small traumatic IVH.Her exam appears non-focal but she is having difficulty participating in exam. At this time, I see no need for any further imaging and no treatment is needed for this. She is cleared for any treatment she needs for other injuries and clear for DVT prophylaxis as needed. No need for follow up with neurosurgery   1.  Diagnosis: Small traumatic IVH   2.  Plan - No further treatment needed, no follow up

## 2020-07-14 NOTE — Evaluation (Signed)
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  Pt had a dislocation of her right thumb which was reduced.  Her head CT showed a small amount of intracranial bleed- per neurology, no further imaging or treatment warranted at this time.  Pt also found with bradycardia, currently being monitored.   Clinical Impression   Courtney Lynn presents to OT with impaired cognition, decreased safety awareness, generalized weakness, and pain/decreased functional use of RUE that impacts her ability to safely complete functional tasks.  Pt presents with decreased orientation to situation/self as well as decreased short term memory, but was able to follow one-step commands throughout.  Per pt report, she was able to complete basic ADLs with mod I (using [id number]) prior to admission.  She receives assistance from ALF for IADLs.  Currently, pt requires grossly min A for BADLs, including UB dressing/bathing/grooming due to R thumb injury and min A for balance in functional mobility.  Pt declined OOB mobility assessment, but required min A for balance and demonstrated weakness per RN report.  Pt presents with generalized weakness and decreased range of motion per OT assessment and manual muscle testing.  Courtney Lynn will likely continue to benefit from skilled OT services in acute setting to address safety awareness, functional strengthening, endurance, fall prevention, and pain management.  Recommend SNF upon discharge to address these same goals.    Follow Up Recommendations  SNF    Equipment Recommendations  None recommended by OT    Recommendations for Other  Services       Precautions / Restrictions Precautions Precautions: Fall Required Braces or Orthoses: Splint/Cast Splint/Cast: R thumb Restrictions Weight Bearing Restrictions: Yes RUE Weight Bearing: Non weight bearing ((R thumb))      Mobility Bed Mobility Overal bed mobility: Modified Independent             General bed mobility comments: extra time & effort    Transfers Overall transfer level: Needs assistance               General transfer comment: not assessed as pt declined- per RN, pt required min A for balance    Balance Overall balance assessment: Needs assistance Sitting-balance support: Bilateral upper extremity supported Sitting balance-Leahy Scale: Fair                                     ADL either performed or assessed with clinical judgement   ADL Overall ADL's : Needs assistance/impaired Eating/Feeding: Minimal assistance;Sitting Eating/Feeding Details (indicate cue type and reason): requires grossly min A for upper body ADLs given R thumb injury Grooming: Minimal assistance;Sitting Grooming Details (indicate cue type and reason): requires grossly min A for upper body ADLs given R thumb injury Upper Body Bathing: Minimal assistance;Sitting Upper Body Bathing Details (indicate cue type and reason): requires grossly min A for upper body ADLs given R thumb injury     Upper Body Dressing : Minimal assistance;Sitting Upper Body Dressing Details (indicate cue type and reason): requires grossly min A for upper body ADLs given R thumb injury  General ADL Comments: Pt requires grossly min A for seated UB ADLs given R thumb injury.  Anticipate min A for lower body ADLs as well.  Pt able to complete bed mobility with stand by assist, but declined further mobility assessment.     Vision Baseline Vision/History: Wears glasses Wears Glasses: Reading only Patient Visual Report: No change from baseline        Perception     Praxis      Pertinent Vitals/Pain Pain Assessment: Faces Faces Pain Scale: Hurts even more Pain Location: R thumb, pt did not rate pain Pain Descriptors / Indicators: Grimacing;Guarding;Moaning;Restless Pain Intervention(s): Limited activity within patient's tolerance;Monitored during session     Hand Dominance Right   Extremity/Trunk Assessment Upper Extremity Assessment Upper Extremity Assessment: Generalized weakness;RUE deficits/detail RUE Deficits / Details: R thumb immobilized RUE: Unable to fully assess due to immobilization   Lower Extremity Assessment Lower Extremity Assessment: Defer to PT evaluation       Communication Communication Communication: HOH   Cognition Arousal/Alertness: Awake/alert Behavior During Therapy: WFL for tasks assessed/performed Overall Cognitive Status: History of cognitive impairments - at baseline                                 General Comments: history of dementia- pt oriented to date (month/year) and setting, decreased orientation to self/situation.  Able to follow one-step commands but some forgetfulness noted.  Pt with decreased awareness of situation and precautions, frequently asking OT to take her splint off and help her leave the hospital.   General Comments  HR = 60s, SpO2 = 100% at rest    Exercises Other Exercises Other Exercises: provided education re: OT role and plan of care, fall and safety precautions, self care, bed mobility   Shoulder Instructions      Home Living Family/patient expects to be discharged to:: Assisted living                             Home Equipment: Walker - 4 wheels;Shower seat - built in   Additional Comments: [place]- pt lives on 2nd floor of ALF, but is able to utilize Media planner      Prior Functioning/Environment Level of Independence: Needs assistance  Gait / Transfers Assistance Needed: Pt is mod I with use of rollator ADL's  / Homemaking Assistance Needed: Pt reports being able to dress/bathe independently, receives assistance from ALF for meals and household maintenance.  Pt reports managing her medications on her own.   Comments: Pt does not drive, she spends her days playing on her computer, reading, and doing Sudoku puzzles.  She reports she swims 3x/week, and typically showers at the pool afterwards.  Endorses 1 additional fall in past year prior to fall leading to ED visit.        OT Problem List: Decreased strength;Decreased range of motion;Decreased activity tolerance;Impaired balance (sitting and/or standing);Decreased cognition;Decreased safety awareness;Decreased knowledge of use of DME or AE;Decreased knowledge of precautions;Cardiopulmonary status limiting activity;Impaired UE functional use;Pain      OT Treatment/Interventions: Self-care/ADL training;Therapeutic exercise;DME and/or AE instruction;Therapeutic activities;Cognitive remediation/compensation;Patient/family education;Balance training    OT Goals(Current goals can be found in the care plan section) Acute Rehab OT Goals Patient Stated Goal: "to get dressed and go home" OT Goal Formulation: With patient Time For Goal Achievement: 07/28/20 Potential to Achieve Goals: Good  OT Frequency: Min 1X/week  Barriers to D/C:            Co-evaluation              AM-PAC OT "6 Clicks" Daily Activity     Outcome Measure Help from another person eating meals?: A Little Help from another person taking care of personal grooming?: A Little Help from another person toileting, which includes using toliet, bedpan, or urinal?: A Lot Help from another person bathing (including washing, rinsing, drying)?: A Lot Help from another person to put on and taking off regular upper body clothing?: A Little Help from another person to put on and taking off regular lower body clothing?: A Little 6 Click Score: 16   End of Session    Activity Tolerance:  Patient limited by pain Patient left: in bed;with call bell/phone within reach  OT Visit Diagnosis: Unsteadiness on feet (R26.81);Muscle weakness (generalized) (M62.81)                Time: 1123-1140 OT Time Calculation (min): 17 min Charges:  OT General Charges $OT Visit: 1 Visit OT Evaluation $OT Eval Moderate Complexity: 1 Mod  Messina Kosinski, OTR/L 07/14/20, 12:30 PM

## 2020-07-14 NOTE — Progress Notes (Signed)
OT Cancellation Note  Patient Details Name: Courtney Lynn MRN: [id number] DOB: [birth year]   Cancelled Treatment:    Reason Eval/Treat Not Completed: Patient at procedure or test/ unavailable  OT consult received.  Attempted x2 to engage pt in evaluation, but pt currently working with other providers.  Will continue to follow up at next opportunity.  Jeneen Montgomery, OTR/L 07/14/20, 9:28 AM

## 2020-07-14 NOTE — Plan of Care (Deleted)

## 2020-07-21 DIAGNOSIS — F0390 Unspecified dementia without behavioral disturbance: Secondary | ICD-10-CM | POA: Diagnosis not present

## 2020-07-21 DIAGNOSIS — S62501A Fracture of unspecified phalanx of right thumb, initial encounter for closed fracture: Secondary | ICD-10-CM | POA: Diagnosis not present

## 2020-07-21 DIAGNOSIS — R001 Bradycardia, unspecified: Secondary | ICD-10-CM | POA: Diagnosis not present

## 2020-07-21 DIAGNOSIS — I10 Essential (primary) hypertension: Secondary | ICD-10-CM | POA: Diagnosis not present

## 2020-07-21 DIAGNOSIS — W01198A Fall on same level from slipping, tripping and stumbling with subsequent striking against other object, initial encounter: Secondary | ICD-10-CM | POA: Diagnosis not present

## 2020-07-21 DIAGNOSIS — R0789 Other chest pain: Secondary | ICD-10-CM | POA: Diagnosis not present

## 2020-07-21 DIAGNOSIS — S06300A Unspecified focal traumatic brain injury without loss of consciousness, initial encounter: Secondary | ICD-10-CM | POA: Diagnosis not present

## 2020-07-28 DIAGNOSIS — S63124A Dislocation of unspecified interphalangeal joint of right thumb, initial encounter: Secondary | ICD-10-CM | POA: Diagnosis not present

## 2020-07-29 DIAGNOSIS — S63124D Dislocation of unspecified interphalangeal joint of right thumb, subsequent encounter: Secondary | ICD-10-CM | POA: Diagnosis not present

## 2020-07-31 DIAGNOSIS — I10 Essential (primary) hypertension: Secondary | ICD-10-CM | POA: Diagnosis not present

## 2020-07-31 DIAGNOSIS — G4733 Obstructive sleep apnea (adult) (pediatric): Secondary | ICD-10-CM | POA: Diagnosis not present

## 2020-07-31 DIAGNOSIS — I341 Nonrheumatic mitral (valve) prolapse: Secondary | ICD-10-CM | POA: Diagnosis not present

## 2020-07-31 DIAGNOSIS — I340 Nonrheumatic mitral (valve) insufficiency: Secondary | ICD-10-CM | POA: Diagnosis not present

## 2020-07-31 DIAGNOSIS — R001 Bradycardia, unspecified: Secondary | ICD-10-CM | POA: Diagnosis not present

## 2020-07-31 DIAGNOSIS — I493 Ventricular premature depolarization: Secondary | ICD-10-CM | POA: Diagnosis not present

## 2020-07-31 DIAGNOSIS — Z9989 Dependence on other enabling machines and devices: Secondary | ICD-10-CM | POA: Diagnosis not present

## 2020-08-01 DIAGNOSIS — R4189 Other symptoms and signs involving cognitive functions and awareness: Secondary | ICD-10-CM | POA: Diagnosis not present

## 2020-08-01 DIAGNOSIS — M4310 Spondylolisthesis, site unspecified: Secondary | ICD-10-CM | POA: Diagnosis not present

## 2020-08-01 DIAGNOSIS — M545 Low back pain, unspecified: Secondary | ICD-10-CM | POA: Diagnosis not present

## 2020-08-01 DIAGNOSIS — M6281 Muscle weakness (generalized): Secondary | ICD-10-CM | POA: Diagnosis not present

## 2020-08-01 DIAGNOSIS — M170 Bilateral primary osteoarthritis of knee: Secondary | ICD-10-CM | POA: Diagnosis not present

## 2020-08-01 DIAGNOSIS — R2681 Unsteadiness on feet: Secondary | ICD-10-CM | POA: Diagnosis not present

## 2020-08-01 DIAGNOSIS — R278 Other lack of coordination: Secondary | ICD-10-CM | POA: Diagnosis not present

## 2020-08-05 DIAGNOSIS — S63124D Dislocation of unspecified interphalangeal joint of right thumb, subsequent encounter: Secondary | ICD-10-CM | POA: Diagnosis not present

## 2020-08-08 DIAGNOSIS — S63124A Dislocation of unspecified interphalangeal joint of right thumb, initial encounter: Secondary | ICD-10-CM | POA: Diagnosis not present

## 2020-08-08 DIAGNOSIS — K219 Gastro-esophageal reflux disease without esophagitis: Secondary | ICD-10-CM | POA: Diagnosis not present

## 2020-08-08 DIAGNOSIS — F0390 Unspecified dementia without behavioral disturbance: Secondary | ICD-10-CM | POA: Diagnosis not present

## 2020-08-08 DIAGNOSIS — I10 Essential (primary) hypertension: Secondary | ICD-10-CM | POA: Diagnosis not present

## 2020-08-08 DIAGNOSIS — I493 Ventricular premature depolarization: Secondary | ICD-10-CM | POA: Diagnosis not present

## 2020-08-08 DIAGNOSIS — S63124D Dislocation of unspecified interphalangeal joint of right thumb, subsequent encounter: Secondary | ICD-10-CM | POA: Diagnosis not present

## 2020-08-08 DIAGNOSIS — Z853 Personal history of malignant neoplasm of breast: Secondary | ICD-10-CM | POA: Diagnosis not present

## 2020-08-08 DIAGNOSIS — Z880 Allergy status to penicillin: Secondary | ICD-10-CM | POA: Diagnosis not present

## 2020-08-08 DIAGNOSIS — Z881 Allergy status to other antibiotic agents status: Secondary | ICD-10-CM | POA: Diagnosis not present

## 2020-08-08 DIAGNOSIS — G4733 Obstructive sleep apnea (adult) (pediatric): Secondary | ICD-10-CM | POA: Diagnosis not present

## 2020-08-08 DIAGNOSIS — Z7289 Other problems related to lifestyle: Secondary | ICD-10-CM | POA: Diagnosis not present

## 2020-08-14 ENCOUNTER — Ambulatory Visit: Payer: PPO | Admitting: Podiatry

## 2020-08-14 DIAGNOSIS — S63124D Dislocation of unspecified interphalangeal joint of right thumb, subsequent encounter: Secondary | ICD-10-CM | POA: Diagnosis not present

## 2020-08-21 DIAGNOSIS — G4730 Sleep apnea, unspecified: Secondary | ICD-10-CM | POA: Diagnosis not present

## 2020-08-21 DIAGNOSIS — Z6835 Body mass index (BMI) 35.0-35.9, adult: Secondary | ICD-10-CM | POA: Diagnosis not present

## 2020-08-21 DIAGNOSIS — S63124D Dislocation of unspecified interphalangeal joint of right thumb, subsequent encounter: Secondary | ICD-10-CM | POA: Diagnosis not present

## 2020-08-21 DIAGNOSIS — I10 Essential (primary) hypertension: Secondary | ICD-10-CM | POA: Diagnosis not present

## 2020-08-21 DIAGNOSIS — Z09 Encounter for follow-up examination after completed treatment for conditions other than malignant neoplasm: Secondary | ICD-10-CM | POA: Diagnosis not present

## 2020-08-21 DIAGNOSIS — D649 Anemia, unspecified: Secondary | ICD-10-CM | POA: Diagnosis not present

## 2020-08-26 DIAGNOSIS — Z09 Encounter for follow-up examination after completed treatment for conditions other than malignant neoplasm: Secondary | ICD-10-CM | POA: Diagnosis not present

## 2020-08-27 DIAGNOSIS — F0390 Unspecified dementia without behavioral disturbance: Secondary | ICD-10-CM | POA: Diagnosis not present

## 2020-08-27 DIAGNOSIS — G4733 Obstructive sleep apnea (adult) (pediatric): Secondary | ICD-10-CM | POA: Diagnosis not present

## 2020-08-27 DIAGNOSIS — Z0000 Encounter for general adult medical examination without abnormal findings: Secondary | ICD-10-CM | POA: Diagnosis not present

## 2020-08-27 DIAGNOSIS — M170 Bilateral primary osteoarthritis of knee: Secondary | ICD-10-CM | POA: Diagnosis not present

## 2020-08-27 DIAGNOSIS — Z6838 Body mass index (BMI) 38.0-38.9, adult: Secondary | ICD-10-CM | POA: Diagnosis not present

## 2020-08-27 DIAGNOSIS — I10 Essential (primary) hypertension: Secondary | ICD-10-CM | POA: Diagnosis not present

## 2020-08-27 DIAGNOSIS — F3340 Major depressive disorder, recurrent, in remission, unspecified: Secondary | ICD-10-CM | POA: Diagnosis not present

## 2020-08-27 DIAGNOSIS — F325 Major depressive disorder, single episode, in full remission: Secondary | ICD-10-CM | POA: Diagnosis not present

## 2020-08-27 DIAGNOSIS — D649 Anemia, unspecified: Secondary | ICD-10-CM | POA: Diagnosis not present

## 2020-08-28 DIAGNOSIS — S63124D Dislocation of unspecified interphalangeal joint of right thumb, subsequent encounter: Secondary | ICD-10-CM | POA: Diagnosis not present

## 2020-08-28 DIAGNOSIS — R001 Bradycardia, unspecified: Secondary | ICD-10-CM | POA: Diagnosis not present

## 2020-09-01 DIAGNOSIS — I341 Nonrheumatic mitral (valve) prolapse: Secondary | ICD-10-CM | POA: Diagnosis not present

## 2020-09-01 DIAGNOSIS — I340 Nonrheumatic mitral (valve) insufficiency: Secondary | ICD-10-CM | POA: Diagnosis not present

## 2020-09-01 DIAGNOSIS — I10 Essential (primary) hypertension: Secondary | ICD-10-CM | POA: Diagnosis not present

## 2020-09-01 DIAGNOSIS — G4733 Obstructive sleep apnea (adult) (pediatric): Secondary | ICD-10-CM | POA: Diagnosis not present

## 2020-09-01 DIAGNOSIS — R001 Bradycardia, unspecified: Secondary | ICD-10-CM | POA: Diagnosis not present

## 2020-09-01 DIAGNOSIS — Z9989 Dependence on other enabling machines and devices: Secondary | ICD-10-CM | POA: Diagnosis not present

## 2020-09-01 DIAGNOSIS — I493 Ventricular premature depolarization: Secondary | ICD-10-CM | POA: Diagnosis not present

## 2020-09-02 DIAGNOSIS — S63124D Dislocation of unspecified interphalangeal joint of right thumb, subsequent encounter: Secondary | ICD-10-CM | POA: Diagnosis not present

## 2020-09-02 DIAGNOSIS — Z09 Encounter for follow-up examination after completed treatment for conditions other than malignant neoplasm: Secondary | ICD-10-CM | POA: Diagnosis not present

## 2020-09-16 DIAGNOSIS — S63124D Dislocation of unspecified interphalangeal joint of right thumb, subsequent encounter: Secondary | ICD-10-CM | POA: Diagnosis not present

## 2020-09-19 DIAGNOSIS — Z880 Allergy status to penicillin: Secondary | ICD-10-CM | POA: Diagnosis not present

## 2020-09-19 DIAGNOSIS — Z472 Encounter for removal of internal fixation device: Secondary | ICD-10-CM | POA: Diagnosis not present

## 2020-09-24 ENCOUNTER — Inpatient Hospital Stay
Admission: EM | Admit: 2020-09-24 | Discharge: 2020-09-30 | DRG: 065 | Disposition: A | Discharge status: Skilled Nursing Facility | Payer: PPO | Source: Skilled Nursing Facility | Attending: Internal Medicine | Admitting: Internal Medicine

## 2020-09-24 ENCOUNTER — Emergency Department: Payer: PPO

## 2020-09-24 DIAGNOSIS — R279 Unspecified lack of coordination: Secondary | ICD-10-CM | POA: Diagnosis not present

## 2020-09-24 DIAGNOSIS — Z853 Personal history of malignant neoplasm of breast: Secondary | ICD-10-CM

## 2020-09-24 DIAGNOSIS — G4733 Obstructive sleep apnea (adult) (pediatric): Secondary | ICD-10-CM | POA: Diagnosis present

## 2020-09-24 DIAGNOSIS — R2681 Unsteadiness on feet: Secondary | ICD-10-CM | POA: Diagnosis not present

## 2020-09-24 DIAGNOSIS — Z9049 Acquired absence of other specified parts of digestive tract: Secondary | ICD-10-CM | POA: Diagnosis not present

## 2020-09-24 DIAGNOSIS — R29709 NIHSS score 9: Secondary | ICD-10-CM | POA: Diagnosis present

## 2020-09-24 DIAGNOSIS — F0390 Unspecified dementia without behavioral disturbance: Secondary | ICD-10-CM | POA: Diagnosis present

## 2020-09-24 DIAGNOSIS — I341 Nonrheumatic mitral (valve) prolapse: Secondary | ICD-10-CM | POA: Diagnosis not present

## 2020-09-24 DIAGNOSIS — Z923 Personal history of irradiation: Secondary | ICD-10-CM | POA: Diagnosis not present

## 2020-09-24 DIAGNOSIS — I63233 Cerebral infarction due to unspecified occlusion or stenosis of bilateral carotid arteries: Secondary | ICD-10-CM | POA: Diagnosis not present

## 2020-09-24 DIAGNOSIS — F3340 Major depressive disorder, recurrent, in remission, unspecified: Secondary | ICD-10-CM | POA: Diagnosis present

## 2020-09-24 DIAGNOSIS — R4701 Aphasia: Secondary | ICD-10-CM | POA: Diagnosis not present

## 2020-09-24 DIAGNOSIS — E871 Hypo-osmolality and hyponatremia: Secondary | ICD-10-CM | POA: Diagnosis present

## 2020-09-24 DIAGNOSIS — R4189 Other symptoms and signs involving cognitive functions and awareness: Secondary | ICD-10-CM | POA: Diagnosis not present

## 2020-09-24 DIAGNOSIS — I672 Cerebral atherosclerosis: Secondary | ICD-10-CM | POA: Diagnosis not present

## 2020-09-24 DIAGNOSIS — K5900 Constipation, unspecified: Secondary | ICD-10-CM

## 2020-09-24 DIAGNOSIS — I63212 Cerebral infarction due to unspecified occlusion or stenosis of left vertebral arteries: Secondary | ICD-10-CM | POA: Diagnosis not present

## 2020-09-24 DIAGNOSIS — I639 Cerebral infarction, unspecified: Principal | ICD-10-CM | POA: Diagnosis present

## 2020-09-24 DIAGNOSIS — R4182 Altered mental status, unspecified: Secondary | ICD-10-CM | POA: Diagnosis not present

## 2020-09-24 DIAGNOSIS — Z881 Allergy status to other antibiotic agents status: Secondary | ICD-10-CM

## 2020-09-24 DIAGNOSIS — K219 Gastro-esophageal reflux disease without esophagitis: Secondary | ICD-10-CM | POA: Diagnosis present

## 2020-09-24 DIAGNOSIS — Z8673 Personal history of transient ischemic attack (TIA), and cerebral infarction without residual deficits: Secondary | ICD-10-CM | POA: Diagnosis not present

## 2020-09-24 DIAGNOSIS — Z20822 Contact with and (suspected) exposure to covid-19: Secondary | ICD-10-CM | POA: Diagnosis not present

## 2020-09-24 DIAGNOSIS — Z79899 Other long term (current) drug therapy: Secondary | ICD-10-CM

## 2020-09-24 DIAGNOSIS — I272 Pulmonary hypertension, unspecified: Secondary | ICD-10-CM | POA: Diagnosis not present

## 2020-09-24 DIAGNOSIS — G9340 Encephalopathy, unspecified: Secondary | ICD-10-CM | POA: Diagnosis present

## 2020-09-24 DIAGNOSIS — E785 Hyperlipidemia, unspecified: Secondary | ICD-10-CM | POA: Diagnosis present

## 2020-09-24 DIAGNOSIS — M170 Bilateral primary osteoarthritis of knee: Secondary | ICD-10-CM | POA: Diagnosis not present

## 2020-09-24 DIAGNOSIS — M79643 Pain in unspecified hand: Secondary | ICD-10-CM | POA: Diagnosis not present

## 2020-09-24 DIAGNOSIS — M4310 Spondylolisthesis, site unspecified: Secondary | ICD-10-CM | POA: Diagnosis not present

## 2020-09-24 DIAGNOSIS — G8191 Hemiplegia, unspecified affecting right dominant side: Secondary | ICD-10-CM | POA: Diagnosis not present

## 2020-09-24 DIAGNOSIS — E1142 Type 2 diabetes mellitus with diabetic polyneuropathy: Secondary | ICD-10-CM | POA: Diagnosis not present

## 2020-09-24 DIAGNOSIS — F0280 Dementia in other diseases classified elsewhere without behavioral disturbance: Secondary | ICD-10-CM | POA: Diagnosis not present

## 2020-09-24 DIAGNOSIS — G9349 Other encephalopathy: Secondary | ICD-10-CM | POA: Diagnosis not present

## 2020-09-24 DIAGNOSIS — F5104 Psychophysiologic insomnia: Secondary | ICD-10-CM | POA: Diagnosis not present

## 2020-09-24 DIAGNOSIS — Z880 Allergy status to penicillin: Secondary | ICD-10-CM

## 2020-09-24 DIAGNOSIS — Z743 Need for continuous supervision: Secondary | ICD-10-CM | POA: Diagnosis not present

## 2020-09-24 DIAGNOSIS — M6281 Muscle weakness (generalized): Secondary | ICD-10-CM | POA: Diagnosis not present

## 2020-09-24 DIAGNOSIS — R278 Other lack of coordination: Secondary | ICD-10-CM | POA: Diagnosis not present

## 2020-09-24 DIAGNOSIS — R404 Transient alteration of awareness: Secondary | ICD-10-CM | POA: Diagnosis not present

## 2020-09-24 DIAGNOSIS — I10 Essential (primary) hypertension: Secondary | ICD-10-CM | POA: Diagnosis not present

## 2020-09-24 DIAGNOSIS — I6389 Other cerebral infarction: Secondary | ICD-10-CM | POA: Diagnosis not present

## 2020-09-24 DIAGNOSIS — Z833 Family history of diabetes mellitus: Secondary | ICD-10-CM

## 2020-09-24 DIAGNOSIS — Z96651 Presence of right artificial knee joint: Secondary | ICD-10-CM | POA: Diagnosis not present

## 2020-09-24 DIAGNOSIS — I63412 Cerebral infarction due to embolism of left middle cerebral artery: Secondary | ICD-10-CM | POA: Diagnosis not present

## 2020-09-24 DIAGNOSIS — M545 Low back pain, unspecified: Secondary | ICD-10-CM | POA: Diagnosis not present

## 2020-09-24 DIAGNOSIS — Z87891 Personal history of nicotine dependence: Secondary | ICD-10-CM

## 2020-09-24 DIAGNOSIS — R2689 Other abnormalities of gait and mobility: Secondary | ICD-10-CM | POA: Diagnosis not present

## 2020-09-24 DIAGNOSIS — I6523 Occlusion and stenosis of bilateral carotid arteries: Secondary | ICD-10-CM | POA: Diagnosis not present

## 2020-09-24 DIAGNOSIS — R410 Disorientation, unspecified: Secondary | ICD-10-CM | POA: Diagnosis not present

## 2020-09-24 DIAGNOSIS — Z741 Need for assistance with personal care: Secondary | ICD-10-CM | POA: Diagnosis not present

## 2020-09-24 LAB — CBC WITH DIFFERENTIAL/PLATELET
Abs Immature Granulocytes: 0.02 10*3/uL (ref 0.00–0.07)
Basophils Absolute: 0.1 10*3/uL (ref 0.0–0.1)
Basophils Relative: 2 %
Eosinophils Absolute: 0.4 10*3/uL (ref 0.0–0.5)
Eosinophils Relative: 7 %
HCT: 32.9 % — ABNORMAL LOW (ref 36.0–46.0)
Hemoglobin: 11.5 g/dL — ABNORMAL LOW (ref 12.0–15.0)
Immature Granulocytes: 0 %
Lymphocytes Relative: 21 %
Lymphs Abs: 1.4 10*3/uL (ref 0.7–4.0)
MCH: 33 pg (ref 26.0–34.0)
MCHC: 35 g/dL (ref 30.0–36.0)
MCV: 94.3 fL (ref 80.0–100.0)
Monocytes Absolute: 0.7 10*3/uL (ref 0.1–1.0)
Monocytes Relative: 11 %
Neutro Abs: 3.9 10*3/uL (ref 1.7–7.7)
Neutrophils Relative %: 59 %
Platelets: 367 10*3/uL (ref 150–400)
RBC: 3.49 MIL/uL — ABNORMAL LOW (ref 3.87–5.11)
RDW: 13.7 % (ref 11.5–15.5)
WBC: 6.5 10*3/uL (ref 4.0–10.5)
nRBC: 0 % (ref 0.0–0.2)

## 2020-09-24 LAB — COMPREHENSIVE METABOLIC PANEL
ALT: 19 U/L (ref 0–44)
AST: 25 U/L (ref 15–41)
Albumin: 3.7 g/dL (ref 3.5–5.0)
Alkaline Phosphatase: 63 U/L (ref 38–126)
Anion gap: 5 (ref 5–15)
BUN: 36 mg/dL — ABNORMAL HIGH (ref 8–23)
CO2: 29 mmol/L (ref 22–32)
Calcium: 8.7 mg/dL — ABNORMAL LOW (ref 8.9–10.3)
Chloride: 100 mmol/L (ref 98–111)
Creatinine, Ser: 1.09 mg/dL — ABNORMAL HIGH (ref 0.44–1.00)
GFR, Estimated: 51 mL/min — ABNORMAL LOW (ref >60)
Glucose, Bld: 113 mg/dL — ABNORMAL HIGH (ref 70–99)
Potassium: 4.9 mmol/L (ref 3.5–5.1)
Sodium: 134 mmol/L — ABNORMAL LOW (ref 135–145)
Total Bilirubin: 0.5 mg/dL (ref 0.3–1.2)
Total Protein: 6.2 g/dL — ABNORMAL LOW (ref 6.5–8.1)

## 2020-09-24 LAB — APTT: aPTT: 26 seconds (ref 24–36)

## 2020-09-24 MED ORDER — ASPIRIN 81 MG PO CHEW: 324.0000 mg | CHEWABLE_TABLET | Freq: Once | ORAL | Status: DC

## 2020-09-24 NOTE — ED Provider Notes (Signed)
[facility] Emergency Department Provider Note  ____________________________________________  Time seen: Approximately 11:23 PM  I have reviewed the triage vital signs and the nursing notes.   HISTORY  Chief Complaint Altered Mental Status  Level 5 caveat:  Portions of the history and physical were unable to be obtained due to AMS   HPI Courtney Lynn is a 80 y.o. female with a history of dementia, breast. cancer, and anemia, hypertension, hyponatremia, diabetes who presents from her nursing home for altered mental status.  Patient is unable to provide any history.  Is extremely confused.  Keeps repeating "I need to get out of here. I cannot stay here."  She keeps showing me her right thumb and saying that it hurts.  She has a very bulky dressing on it.  Past Medical History:  Diagnosis Date   Anemia    Arthritis    Body mass index (bmi) 37.0-37.9, adult    Breast cancer ([place]) 01/02/2016   right breast/radiation   Cancer ([place]) 2017   BREAST   Chronic insomnia    Complication of anesthesia    has difficulty last surgery intubating   Depression    Difficult intubation    WITH TKR   Dizziness    Dysrhythmia    PVC'S,BRADYCARDIA   GERD (gastroesophageal reflux disease)    History of chicken pox    Hypertension    Hyponatremia    Intermittent vertigo    Irregular heart beat    Memory difficulties    Mild mitral regurgitation    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HCC)    MVP (mitral valve prolapse)    Orthopnea    Personal history of radiation therapy 2017   Pre-diabetes    PVC's (premature ventricular contractions)    Reflux    Sleep apnea    CPAP    Patient Active Problem List   Diagnosis Date Noted   Fall 07/14/2020   Intracranial bleed ([place]) 07/13/2020   Dementia ([place]) 07/13/2020   Closed dislocation of right thumb 07/13/2020   AKI (acute kidney injury) ([place]) 07/13/2020   Syncope and collapse 07/13/2020   Major depressive disorder,  recurrent, in remission ([place]) 10/17/2018   Loss of memory 03/14/2018   Poorly-controlled hypertension 01/05/2018   Hemorrhoids 12/15/2017   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   Postmenopausal osteoporosis 11/23/2017   Lumbar radiculopathy 07/14/2017   Osteopenia of the elderly 05/24/2017   Dizziness 12/13/2015   Intermittent vertigo 12/13/2015   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 ([place]) 12/07/2015   Fracture of left foot 10/26/2015   OSA on CPAP 04/14/2015   Nonrheumatic mitral valve insufficiency 09/10/2014   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[place]) 09/10/2014   Nonrheumatic mitral valve prolapse 09/10/2014   Premature ventricular contractions 09/10/2014   Other secondary pulmonary hypertension ([place]) 09/10/2014   Ventricular premature depolarization 09/10/2014   Bradycardia 07/01/2014   Hypertension 07/01/2014   Hyponatremia 07/01/2014   Hyposmolality and/or hyponatremia 07/01/2014   Chronic anemia 04/23/2014   Anemia 04/23/2014   Body mass index (BMI) of 37.0-37.9 in adult 03/28/2014   Chronic insomnia 03/28/2014   Depressive disorder 03/28/2014   Essential hypertension 03/28/2014   Gastroesophageal reflux disease without esophagitis 03/28/2014   Pre-diabetes 03/28/2014   Primary osteoarthritis of both knees 03/28/2014   Insomnia 03/28/2014   Depression, major, single episode, complete remission ([place]) 03/28/2014   Other obesity 03/28/2014   Acute bronchitis, viral 01/30/2014    Past Surgical History:  Procedure Laterality Date   APPENDECTOMY  BREAST BIOPSY Right 01/02/2016   PARTIAL MASTECTOMY- [facility]: Lambert Rhonda W and DCIS   BREAST LUMPECTOMY Right 2017   CATARACT EXTRACTION W/PHACO Right 09/14/2016   Procedure: CATARACT EXTRACTION PHACO AND INTRAOCULAR LENS PLACEMENT (IOC);  Surgeon: Birder Robson, MD;  Location: [facility] ORS;  Service: Ophthalmology;  Laterality: Right;  [place] 00:32 AP% 19.5 CDE 6.28 Fluid pack lot # [id number]   CATARACT EXTRACTION W/PHACO Left 10/05/2016    Procedure: CATARACT EXTRACTION PHACO AND INTRAOCULAR LENS PLACEMENT (IOC);  Surgeon: Birder Robson, MD;  Location: [facility] ORS;  Service: Ophthalmology;  Laterality: Left;  [place] 00:46.9 AP% 16.0 CDE 7.48 Fluid Pack lot # [id number]   CHOLECYSTECTOMY     COLONOSCOPY WITH PROPOFOL N/A 08/24/2016   Procedure: COLONOSCOPY WITH PROPOFOL;  Surgeon: Lollie Sails, MD;  Location: [facility] ENDOSCOPY;  Service: Endoscopy;  Laterality: N/A;   COLONOSCOPY WITH PROPOFOL N/A 07/12/2018   Procedure: COLONOSCOPY WITH PROPOFOL;  Surgeon: Toledo, Benay Pike, MD;  Location: [facility] ENDOSCOPY;  Service: Gastroenterology;  Laterality: N/A;   dental implants     FOOT SURGERY Right    FRACTURE SURGERY     KNEE CAP   HEMORRHOID SURGERY N/A 04/19/2019   Procedure: HEMORRHOIDECTOMY;  Surgeon: Benjamine Sprague, DO;  Location: [facility] ORS;  Service: General;  Laterality: N/A;   JOINT REPLACEMENT Right 2011   KNEE   PARTIAL MASTECTOMY WITH NEEDLE LOCALIZATION Right 01/02/2016   Procedure: PARTIAL MASTECTOMY WITH NEEDLE LOCALIZATION;  Surgeon: Leonie Green, MD;  Location: [facility] ORS;  Service: General;  Laterality: Right;   PATELLA FRACTURE SURGERY     REPLACEMENT TOTAL KNEE Right    SENTINEL NODE BIOPSY Right 01/02/2016   Procedure: SENTINEL NODE BIOPSY;  Surgeon: Leonie Green, MD;  Location: [facility] ORS;  Service: General;  Laterality: Right;   TONSILLECTOMY      Prior to Admission medications   Medication Sig Start Date End Date Taking? Authorizing Provider  acetaminophen (TYLENOL) 500 MG tablet Take 1,000 mg by mouth at bedtime.    [provider]  citalopram (CELEXA) 10 MG tablet Take 10 mg by mouth every other day.    [provider]  docusate sodium (COLACE) 100 MG capsule Take 100 mg by mouth daily.     [provider]  donepezil (ARICEPT) 10 MG tablet Take 10 mg by mouth at bedtime.    [provider]  doxycycline (VIBRA-TABS) 100 MG tablet Take 1 tablet (100 mg total) by mouth  every 12 (twelve) hours. 07/14/20   Danford, Suann Larry, MD  felodipine (PLENDIL) 5 MG 24 hr tablet Take 5 mg by mouth daily.    [provider]  gabapentin (NEURONTIN) 100 MG capsule Take 200 mg by mouth at bedtime.    [provider]  hydrALAZINE (APRESOLINE) 50 MG tablet Take 50 mg by mouth 2 (two) times daily. 03/21/19   [provider]  HYDROcodone-acetaminophen (NORCO/VICODIN) 5-325 MG tablet Take 1-2 tablets by mouth every 4 (four) hours as needed for moderate pain. 07/14/20   Danford, Suann Larry, MD  iron polysaccharides (NIFEREX) 150 MG capsule Take 150 mg by mouth daily.    [provider]  lactase (LACTAID) 3000 units tablet Take 1 tablet by mouth daily.    [provider]  meloxicam (MOBIC) 15 MG tablet Take 15 mg by mouth daily.    [provider]  methocarbamol (ROBAXIN) 500 MG tablet Take 500 mg by mouth at bedtime.    [provider]  Multiple Vitamin (MULTIVITAMIN) tablet Take 1 tablet by  mouth 2 (two) times daily.    [provider]  olmesartan (BENICAR) 40 MG tablet Take 40 mg by mouth daily.  03/25/18   [provider]  omeprazole (PRILOSEC) 20 MG capsule Take 20 mg by mouth daily.    [provider]  OVER THE COUNTER MEDICATION Take 1 capsule by mouth daily. **Shaklee OmegaGuard**    [provider]  OVER THE COUNTER MEDICATION Take 1 capsule by mouth 2 (two) times daily. **Omega Q Plus**    [provider]  OVER THE COUNTER MEDICATION Take 1 tablet by mouth daily. **OptiBiotic**    [provider]  OVER THE COUNTER MEDICATION Take 2 capsules by mouth daily. **Shaklee OsteoMatrix**    [provider]  OVER THE COUNTER MEDICATION Take 1 capsule by mouth daily. **LifeVantage Protandim**    [provider]  OVER THE COUNTER MEDICATION Take 2 tablets by mouth daily with lunch. **Shaklee Blood Pressure**    [provider]  Wheat Dextrin  (BENEFIBER) CHEW Chew 1 tablet by mouth daily.    [provider]    Allergies Cleocin [clindamycin hcl] and Pen vk [penicillin v]  Family History  Problem Relation Age of Onset   GI Bleed Mother    Diabetes Father    Congestive Heart Failure Father    Breast cancer Neg Hx     Social History Social History   Tobacco Use   Smoking status: Former   Smokeless tobacco: Never   Tobacco comments:    Quit 1969  Vaping Use   Vaping Use: Never used  Substance Use Topics   Alcohol use: Yes    Alcohol/week: 7.0 standard drinks    Types: 7 Glasses of wine per week    Comment: OCCASIONALLY   Drug use: No    Review of Systems  Constitutional: Negative for fever. + AMS  Level 5 caveat:  Portions of the history and physical were unable to be obtained due to AMS  ____________________________________________   PHYSICAL EXAM:  VITAL SIGNS: ED Triage Vitals  Enc Vitals Group     BP 09/24/20 2240 (!) 174/63     Pulse Rate 09/24/20 2240 61     Resp 09/24/20 2240 18     Temp 09/24/20 2240 98.3 F (36.8 C)     Temp Source 09/24/20 2240 Oral     SpO2 09/24/20 2240 100 %     Weight 09/24/20 2238 185 lb 3 oz (84 kg)     Height 09/24/20 2238 '5\' 1"'$  (1.549 m)     Head Circumference --      Peak Flow --      Pain Score --      Pain Loc --      Pain Edu? --      Excl. in [place]? --     Constitutional: Alert, not answering questions about orientation HEENT:      Head: Normocephalic and atraumatic.         Eyes: Conjunctivae are normal. Sclera is non-icteric.       Mouth/Throat: Mucous membranes are moist.       Neck: Supple with no signs of meningismus. Cardiovascular: Regular rate and rhythm. No murmurs, gallops, or rubs. 2+ symmetrical distal pulses are present in all extremities. No JVD. Respiratory: Normal respiratory effort. Lungs are clear to auscultation bilaterally.  Gastrointestinal: Soft, non tender, and non distended with positive bowel sounds. No rebound or  guarding. Genitourinary: No CVA tenderness. Musculoskeletal:  No edema, cyanosis, or  erythema of extremities. Neurologic: Normal speech. Face is symmetric. Moving all extremities. No gross focal neurologic deficits are appreciated. Skin: Skin is warm, dry and intact. No rash noted. Psychiatric: Mood and affect are normal. Speech and behavior are normal.  ____________________________________________   LABS (all labs ordered are listed, but only abnormal results are displayed)  Labs Reviewed  CBC WITH DIFFERENTIAL/PLATELET - Abnormal; Notable for the following components:      Result Value   RBC 3.49 (*)    Hemoglobin 11.5 (*)    HCT 32.9 (*)    All other components within normal limits  COMPREHENSIVE METABOLIC PANEL - Abnormal; Notable for the following components:   Sodium 134 (*)    Glucose, Bld 113 (*)    BUN 36 (*)    Creatinine, Ser 1.09 (*)    Calcium 8.7 (*)    Total Protein 6.2 (*)    GFR, Estimated 51 (*)    All other components within normal limits  RESP PANEL BY RT-PCR (FLU A&B, COVID) ARPGX2  URINALYSIS, COMPLETE (UACMP) WITH MICROSCOPIC  APTT  URINE DRUG SCREEN, QUALITATIVE ([facility] ONLY)   ____________________________________________  EKG  ED ECG REPORT I, Rudene Re, the attending physician, personally viewed and interpreted this ECG.  NSR, rate 60, 1st degree AV block, normal axis, no STE or depression.  ____________________________________________  RADIOLOGY  I have personally reviewed the images performed during this visit and I agree with the Radiologist's read.   Interpretation by Radiologist:  CT HEAD WO CONTRAST (5MM)  Result Date: 09/24/2020 CLINICAL DATA:  Altered mental status EXAM: CT HEAD WITHOUT CONTRAST TECHNIQUE: Contiguous axial images were obtained from the base of the skull through the vertex without intravenous contrast. COMPARISON:  07/13/2020 FINDINGS: Brain: Normal anatomic configuration of the brain. There is effacement of  the normal gray-white matter differentiation in keeping with an acute to subacute left temporoparietal cortical infarct. Minimal mass effect with effacement of the overlying sulci. No superimposed acute intracranial hemorrhage. There is interval development of a focal region of low attenuation within the right cerebellar hemisphere without significant associated mass effect or acute hemorrhage suspicious for an acute to subacute infarct. Evaluation, however, is slightly limited by motion artifact and streak artifact. Mild parenchymal volume loss is commensurate with the patient's age. Asymmetrically more severe atrophy involving the anterior temporal lobes is again noted, unchanged. No abnormal intra or extra-axial mass lesion or fluid collection. No midline shift. Ventricular size is normal. Vascular: No asymmetric hyperdense vasculature at the skull base. Skull: Intact Sinuses/Orbits: The orbits are unremarkable. Paranasal sinuses are clear save for a mucous retention cyst partially visualized within the left maxillary sinus. Other: Mastoid air cells and middle ear cavities are clear. IMPRESSION: Acute to subacute cortical infarct within the a left temporoparietal cortex in keeping with a left posterior MCA territory infarct. Possible acute to subacute right cerebellar hemispheric infarct. No superimposed acute hemorrhage. No significant associated mass effect. MRI examination may be helpful for confirmation. These results were called by telephone at the time of interpretation on 09/24/2020 at 11:35 pm to provider [facility] , who verbally acknowledged these results. Electronically Signed   By: Fidela Salisbury M.D.   On: 09/24/2020 23:39     ____________________________________________   PROCEDURES  Procedure(s) performed:yes .1-3 Lead EKG Interpretation  Date/Time: 09/24/2020 11:27 PM Performed by: Rudene Re, MD Authorized by: Rudene Re, MD     Interpretation: non-specific      ECG rate assessment: normal     Rhythm: sinus  rhythm     Ectopy: none     Conduction: abnormal     Abnormal conduction: 1st degree AV block     Critical Care performed:  None ____________________________________________   INITIAL IMPRESSION / ASSESSMENT AND PLAN / ED COURSE  80 y.o. female with a history of dementia, breast. cancer, and anemia, hypertension, hyponatremia, diabetes who presents from her nursing home for altered mental status.  Patient is extremely altered, disoriented, keeps repeating that she needs to get out of here.  Her only complaint is her right thumb which is wrapped in a bulky dressing.  Once removed she has a tiny little cut on the finger but no other abnormal findings.  Reviewed of Epic shows that patient had a recent dislocation She is otherwise neuro intact with symmetric face and moving all extremities.  Unclear what patient's baseline is.  We will attempt to talk to facility.  Differential diagnosis is broad and includes stroke versus hyponatremia versus sepsis versus head bleed.  Labs and imaging pending.  Patient placed on telemetry for close monitoring.  Old medical records reviewed   _________________________ 11:40 PM on 09/24/2020 ----------------------------------------- Just spoke with the radiologist who sees 2 foci of acute to subacute stroke in the left posterior cortex and right cerebellum. Stroke protocols initiated. Will do a bedside swallow eval and give aspirin.  Patient will need an MRI     _____________________________________________ Please note:  Patient was evaluated in Emergency Department today for the symptoms described in the history of present illness. Patient was evaluated in the context of the global COVID-19 pandemic, which necessitated consideration that the patient might be at risk for infection with the SARS-CoV-2 virus that causes COVID-19. Institutional protocols and algorithms that pertain to the evaluation of patients at  risk for COVID-19 are in a state of rapid change based on information released by regulatory bodies including the CDC and federal and state organizations. These policies and algorithms were followed during the patient's care in the ED.  Some ED evaluations and interventions may be delayed as a result of limited staffing during the pandemic.   [place] Controlled Substance Database was reviewed by me. ____________________________________________   FINAL CLINICAL IMPRESSION(S) / ED DIAGNOSES   Final diagnoses:  Cerebrovascular accident (CVA), unspecified mechanism ([place])      NEW MEDICATIONS STARTED DURING THIS VISIT:  ED Discharge Orders     None        Note:  This document was prepared using Dragon voice recognition software and may include unintentional dictation errors.    Rudene Re, MD 09/24/20 [phone]

## 2020-09-24 NOTE — ED Notes (Addendum)
Blood specimens collected with IV initiation: x1 blood culture, SST, Lavender, Lt green, Blue, [employer]

## 2020-09-24 NOTE — ED Notes (Signed)
[place] Alarm initiated to patient safety

## 2020-09-24 NOTE — ED Notes (Signed)
Patient transported to CT 

## 2020-09-24 NOTE — ED Triage Notes (Signed)
From [facility] via EMS for increased altered mental status; A/O x1; respirations even non-labored; skin warm dry

## 2020-09-25 ENCOUNTER — Inpatient Hospital Stay: Payer: PPO

## 2020-09-25 ENCOUNTER — Other Ambulatory Visit (HOSPITAL_COMMUNITY): Payer: PPO

## 2020-09-25 DIAGNOSIS — K219 Gastro-esophageal reflux disease without esophagitis: Secondary | ICD-10-CM

## 2020-09-25 DIAGNOSIS — G8191 Hemiplegia, unspecified affecting right dominant side: Secondary | ICD-10-CM | POA: Diagnosis present

## 2020-09-25 DIAGNOSIS — Z853 Personal history of malignant neoplasm of breast: Secondary | ICD-10-CM | POA: Diagnosis not present

## 2020-09-25 DIAGNOSIS — R29709 NIHSS score 9: Secondary | ICD-10-CM | POA: Diagnosis present

## 2020-09-25 DIAGNOSIS — Z880 Allergy status to penicillin: Secondary | ICD-10-CM | POA: Diagnosis not present

## 2020-09-25 DIAGNOSIS — I672 Cerebral atherosclerosis: Secondary | ICD-10-CM | POA: Diagnosis present

## 2020-09-25 DIAGNOSIS — I10 Essential (primary) hypertension: Secondary | ICD-10-CM | POA: Diagnosis not present

## 2020-09-25 DIAGNOSIS — E871 Hypo-osmolality and hyponatremia: Secondary | ICD-10-CM | POA: Diagnosis present

## 2020-09-25 DIAGNOSIS — Z20822 Contact with and (suspected) exposure to covid-19: Secondary | ICD-10-CM | POA: Diagnosis present

## 2020-09-25 DIAGNOSIS — I639 Cerebral infarction, unspecified: Principal | ICD-10-CM

## 2020-09-25 DIAGNOSIS — R4701 Aphasia: Secondary | ICD-10-CM | POA: Diagnosis present

## 2020-09-25 DIAGNOSIS — Z9049 Acquired absence of other specified parts of digestive tract: Secondary | ICD-10-CM | POA: Diagnosis not present

## 2020-09-25 DIAGNOSIS — Z96651 Presence of right artificial knee joint: Secondary | ICD-10-CM | POA: Diagnosis present

## 2020-09-25 DIAGNOSIS — I63412 Cerebral infarction due to embolism of left middle cerebral artery: Secondary | ICD-10-CM | POA: Diagnosis not present

## 2020-09-25 DIAGNOSIS — G9340 Encephalopathy, unspecified: Secondary | ICD-10-CM | POA: Diagnosis present

## 2020-09-25 DIAGNOSIS — G4733 Obstructive sleep apnea (adult) (pediatric): Secondary | ICD-10-CM | POA: Diagnosis present

## 2020-09-25 DIAGNOSIS — I341 Nonrheumatic mitral (valve) prolapse: Secondary | ICD-10-CM | POA: Diagnosis present

## 2020-09-25 DIAGNOSIS — E785 Hyperlipidemia, unspecified: Secondary | ICD-10-CM | POA: Diagnosis present

## 2020-09-25 DIAGNOSIS — F3340 Major depressive disorder, recurrent, in remission, unspecified: Secondary | ICD-10-CM | POA: Diagnosis present

## 2020-09-25 DIAGNOSIS — F0390 Unspecified dementia without behavioral disturbance: Secondary | ICD-10-CM | POA: Diagnosis present

## 2020-09-25 DIAGNOSIS — Z881 Allergy status to other antibiotic agents status: Secondary | ICD-10-CM | POA: Diagnosis not present

## 2020-09-25 DIAGNOSIS — F5104 Psychophysiologic insomnia: Secondary | ICD-10-CM | POA: Diagnosis present

## 2020-09-25 DIAGNOSIS — E1142 Type 2 diabetes mellitus with diabetic polyneuropathy: Secondary | ICD-10-CM | POA: Diagnosis present

## 2020-09-25 DIAGNOSIS — Z923 Personal history of irradiation: Secondary | ICD-10-CM | POA: Diagnosis not present

## 2020-09-25 DIAGNOSIS — I272 Pulmonary hypertension, unspecified: Secondary | ICD-10-CM | POA: Diagnosis present

## 2020-09-25 DIAGNOSIS — G9349 Other encephalopathy: Secondary | ICD-10-CM | POA: Diagnosis present

## 2020-09-25 LAB — URINALYSIS, COMPLETE (UACMP) WITH MICROSCOPIC
Bacteria, UA: NONE SEEN
Bilirubin Urine: NEGATIVE
Glucose, UA: NEGATIVE mg/dL
Hgb urine dipstick: NEGATIVE
Ketones, ur: NEGATIVE mg/dL
Leukocytes,Ua: NEGATIVE
Nitrite: NEGATIVE
Protein, ur: NEGATIVE mg/dL
Specific Gravity, Urine: 1.014 (ref 1.005–1.030)
pH: 7 (ref 5.0–8.0)

## 2020-09-25 LAB — URINE DRUG SCREEN, QUALITATIVE (ARMC ONLY)
Amphetamines, Ur Screen: NOT DETECTED
Barbiturates, Ur Screen: NOT DETECTED
Benzodiazepine, Ur Scrn: NOT DETECTED
Cannabinoid 50 Ng, Ur ~~LOC~~: NOT DETECTED
Cocaine Metabolite,Ur ~~LOC~~: NOT DETECTED
MDMA (Ecstasy)Ur Screen: NOT DETECTED
Methadone Scn, Ur: NOT DETECTED
Opiate, Ur Screen: NOT DETECTED
Phencyclidine (PCP) Ur S: NOT DETECTED
Tricyclic, Ur Screen: NOT DETECTED

## 2020-09-25 LAB — BASIC METABOLIC PANEL
Anion gap: 8 (ref 5–15)
BUN: 30 mg/dL — ABNORMAL HIGH (ref 8–23)
CO2: 24 mmol/L (ref 22–32)
Calcium: 9.2 mg/dL (ref 8.9–10.3)
Chloride: 102 mmol/L (ref 98–111)
Creatinine, Ser: 0.93 mg/dL (ref 0.44–1.00)
GFR, Estimated: >60 mL/min (ref >60)
Glucose, Bld: 111 mg/dL — ABNORMAL HIGH (ref 70–99)
Potassium: 4.2 mmol/L (ref 3.5–5.1)
Sodium: 134 mmol/L — ABNORMAL LOW (ref 135–145)

## 2020-09-25 LAB — CBC
HCT: 35.2 % — ABNORMAL LOW (ref 36.0–46.0)
Hemoglobin: 12.3 g/dL (ref 12.0–15.0)
MCH: 31.9 pg (ref 26.0–34.0)
MCHC: 34.9 g/dL (ref 30.0–36.0)
MCV: 91.2 fL (ref 80.0–100.0)
Platelets: 394 10*3/uL (ref 150–400)
RBC: 3.86 MIL/uL — ABNORMAL LOW (ref 3.87–5.11)
RDW: 13.7 % (ref 11.5–15.5)
WBC: 9.9 10*3/uL (ref 4.0–10.5)
nRBC: 0 % (ref 0.0–0.2)

## 2020-09-25 LAB — RESP PANEL BY RT-PCR (FLU A&B, COVID) ARPGX2
Influenza A by PCR: NEGATIVE
Influenza B by PCR: NEGATIVE
SARS Coronavirus 2 by RT PCR: NEGATIVE

## 2020-09-25 LAB — LIPID PANEL
Cholesterol: 234 mg/dL — ABNORMAL HIGH (ref 0–200)
HDL: 62 mg/dL (ref >40)
LDL Cholesterol: 162 mg/dL — ABNORMAL HIGH (ref 0–99)
Total CHOL/HDL Ratio: 3.8 RATIO
Triglycerides: 50 mg/dL (ref <150)
VLDL: 10 mg/dL (ref 0–40)

## 2020-09-25 LAB — CBG MONITORING, ED
Glucose-Capillary: 116 mg/dL — ABNORMAL HIGH (ref 70–99)
Glucose-Capillary: 114 mg/dL — ABNORMAL HIGH (ref 70–99)
Glucose-Capillary: 110 mg/dL — ABNORMAL HIGH (ref 70–99)

## 2020-09-25 LAB — GLUCOSE, CAPILLARY: Glucose-Capillary: 109 mg/dL — ABNORMAL HIGH (ref 70–99)

## 2020-09-25 LAB — HEMOGLOBIN A1C
Hgb A1c MFr Bld: 5.3 % (ref 4.8–5.6)
Mean Plasma Glucose: 105.41 mg/dL

## 2020-09-25 MED ORDER — LACTASE 3000 UNITS PO TABS
1.0000 | ORAL_TABLET | Freq: Every day | ORAL | Status: DC
  Administered 2020-09-26 – 2020-09-30 (×5): 3000 [IU] via ORAL
  Filled 2020-09-25 (×6): qty 1

## 2020-09-25 MED ORDER — INSULIN ASPART 100 UNIT/ML IJ SOLN
0.0000 [IU] | Freq: Three times a day (TID) | INTRAMUSCULAR | Status: DC
  Administered 2020-09-28 – 2020-09-29 (×3): 1 [IU] via SUBCUTANEOUS
  Administered 2020-09-29: 17:00:00 2 [IU] via SUBCUTANEOUS
  Filled 2020-09-25 (×4): qty 1

## 2020-09-25 MED ORDER — HYDRALAZINE HCL 50 MG PO TABS: 50.0000 mg | ORAL_TABLET | Freq: Two times a day (BID) | ORAL | Status: DC

## 2020-09-25 MED ORDER — ASPIRIN EC 325 MG PO TBEC: 325.0000 mg | DELAYED_RELEASE_TABLET | Freq: Every day | ORAL | Status: DC

## 2020-09-25 MED ORDER — DONEPEZIL HCL 5 MG PO TABS
10.0000 mg | ORAL_TABLET | Freq: Every day | ORAL | Status: DC
  Administered 2020-09-25 – 2020-09-29 (×5): 10 mg via ORAL
  Filled 2020-09-25 (×5): qty 2

## 2020-09-25 MED ORDER — PANTOPRAZOLE SODIUM 40 MG PO TBEC
40.0000 mg | DELAYED_RELEASE_TABLET | Freq: Every day | ORAL | Status: DC
Start: 2020-09-25 — End: 2020-09-30
  Administered 2020-09-26 – 2020-09-30 (×5): 40 mg via ORAL
  Filled 2020-09-25 (×5): qty 1

## 2020-09-25 MED ORDER — ONDANSETRON HCL 4 MG PO TABS: 4.0000 mg | ORAL_TABLET | Freq: Four times a day (QID) | ORAL | Status: DC | PRN

## 2020-09-25 MED ORDER — ACETAMINOPHEN 650 MG RE SUPP: 650.0000 mg | Freq: Four times a day (QID) | RECTAL | Status: DC | PRN

## 2020-09-25 MED ORDER — GABAPENTIN 100 MG PO CAPS
200.0000 mg | ORAL_CAPSULE | Freq: Every day | ORAL | Status: DC
  Administered 2020-09-25 – 2020-09-29 (×5): 200 mg via ORAL
  Filled 2020-09-25 (×5): qty 2

## 2020-09-25 MED ORDER — HALOPERIDOL LACTATE 5 MG/ML IJ SOLN
2.0000 mg | Freq: Four times a day (QID) | INTRAMUSCULAR | Status: DC | PRN
  Administered 2020-09-25 – 2020-09-26 (×2): 2 mg via INTRAMUSCULAR
  Filled 2020-09-25: qty 1

## 2020-09-25 MED ORDER — METHOCARBAMOL 500 MG PO TABS
500.0000 mg | ORAL_TABLET | Freq: Every day | ORAL | Status: DC
  Administered 2020-09-26 – 2020-09-29 (×4): 500 mg via ORAL
  Filled 2020-09-25 (×6): qty 1

## 2020-09-25 MED ORDER — BISACODYL 10 MG RE SUPP
10.0000 mg | Freq: Every day | RECTAL | Status: DC | PRN
  Filled 2020-09-25: qty 1

## 2020-09-25 MED ORDER — ATORVASTATIN CALCIUM 20 MG PO TABS: 10.0000 mg | ORAL_TABLET | Freq: Every day | ORAL | Status: DC

## 2020-09-25 MED ORDER — MAGNESIUM HYDROXIDE 400 MG/5ML PO SUSP
30.0000 mL | Freq: Every day | ORAL | Status: DC | PRN
  Filled 2020-09-25: qty 30

## 2020-09-25 MED ORDER — HYDRALAZINE HCL 50 MG PO TABS
50.0000 mg | ORAL_TABLET | Freq: Two times a day (BID) | ORAL | Status: DC
  Administered 2020-09-25 – 2020-09-26 (×3): 50 mg via ORAL
  Filled 2020-09-25 (×3): qty 1

## 2020-09-25 MED ORDER — ATORVASTATIN CALCIUM 20 MG PO TABS
80.0000 mg | ORAL_TABLET | Freq: Every day | ORAL | Status: DC
  Administered 2020-09-25 – 2020-09-30 (×6): 80 mg via ORAL
  Filled 2020-09-25 (×5): qty 4

## 2020-09-25 MED ORDER — TRAZODONE HCL 50 MG PO TABS
25.0000 mg | ORAL_TABLET | Freq: Every evening | ORAL | Status: DC | PRN
  Filled 2020-09-25 (×2): qty 1

## 2020-09-25 MED ORDER — ATORVASTATIN CALCIUM 20 MG PO TABS: 80.0000 mg | ORAL_TABLET | Freq: Every day | ORAL | Status: DC

## 2020-09-25 MED ORDER — HYDRALAZINE HCL 20 MG/ML IJ SOLN: 10.0000 mg | INTRAMUSCULAR | Status: DC | PRN

## 2020-09-25 MED ORDER — CALCIUM POLYCARBOPHIL 625 MG PO TABS
625.0000 mg | ORAL_TABLET | Freq: Every day | ORAL | Status: DC
  Administered 2020-09-26 – 2020-09-30 (×5): 625 mg via ORAL
  Filled 2020-09-25 (×6): qty 1

## 2020-09-25 MED ORDER — IOHEXOL 350 MG/ML SOLN
75.0000 mL | Freq: Once | INTRAVENOUS | Status: AC | PRN
  Administered 2020-09-25: 75 mL via INTRAVENOUS
  Filled 2020-09-25: qty 75

## 2020-09-25 MED ORDER — ACETAMINOPHEN 325 MG PO TABS
650.0000 mg | ORAL_TABLET | Freq: Four times a day (QID) | ORAL | Status: DC | PRN
  Filled 2020-09-25: qty 2

## 2020-09-25 MED ORDER — ENOXAPARIN SODIUM 40 MG/0.4ML IJ SOSY
40.0000 mg | PREFILLED_SYRINGE | INTRAMUSCULAR | Status: DC
  Administered 2020-09-25 – 2020-09-30 (×6): 40 mg via SUBCUTANEOUS
  Filled 2020-09-25 (×6): qty 0.4

## 2020-09-25 MED ORDER — POLYSACCHARIDE IRON COMPLEX 150 MG PO CAPS
150.0000 mg | ORAL_CAPSULE | Freq: Every day | ORAL | Status: DC
  Administered 2020-09-26 – 2020-09-30 (×5): 150 mg via ORAL
  Filled 2020-09-25 (×6): qty 1

## 2020-09-25 MED ORDER — ASPIRIN 300 MG RE SUPP
300.0000 mg | Freq: Every day | RECTAL | Status: DC
  Administered 2020-09-25 – 2020-09-30 (×5): 300 mg via RECTAL
  Filled 2020-09-25 (×6): qty 1

## 2020-09-25 MED ORDER — ADULT MULTIVITAMIN W/MINERALS CH
1.0000 | ORAL_TABLET | Freq: Two times a day (BID) | ORAL | Status: DC
  Administered 2020-09-25 – 2020-09-30 (×10): 1 via ORAL
  Filled 2020-09-25 (×10): qty 1

## 2020-09-25 MED ORDER — SODIUM CHLORIDE 0.9 % IV SOLN: INTRAVENOUS | Status: DC

## 2020-09-25 MED ORDER — STROKE: EARLY STAGES OF RECOVERY BOOK: Freq: Once | Status: DC

## 2020-09-25 MED ORDER — DOCUSATE SODIUM 100 MG PO CAPS: 100.0000 mg | ORAL_CAPSULE | Freq: Every day | ORAL | Status: DC

## 2020-09-25 MED ORDER — HYDRALAZINE HCL 50 MG PO TABS
50.0000 mg | ORAL_TABLET | Freq: Three times a day (TID) | ORAL | Status: DC
  Filled 2020-09-25: qty 1

## 2020-09-25 MED ORDER — FELODIPINE ER 5 MG PO TB24
5.0000 mg | ORAL_TABLET | Freq: Every day | ORAL | Status: DC
  Administered 2020-09-26 – 2020-09-30 (×5): 5 mg via ORAL
  Filled 2020-09-25 (×6): qty 1

## 2020-09-25 MED ORDER — HYDRALAZINE HCL 20 MG/ML IJ SOLN
10.0000 mg | INTRAMUSCULAR | Status: DC | PRN
  Administered 2020-09-25: 10 mg via INTRAVENOUS
  Filled 2020-09-25: qty 1

## 2020-09-25 MED ORDER — CITALOPRAM HYDROBROMIDE 20 MG PO TABS
10.0000 mg | ORAL_TABLET | ORAL | Status: DC
  Administered 2020-09-27 – 2020-09-29 (×2): 10 mg via ORAL
  Filled 2020-09-25 (×2): qty 1

## 2020-09-25 MED ORDER — IRBESARTAN 75 MG PO TABS
37.5000 mg | ORAL_TABLET | Freq: Every day | ORAL | Status: DC
  Administered 2020-09-26: 10:00:00 37.5 mg via ORAL
  Filled 2020-09-25 (×3): qty 0.5

## 2020-09-25 MED ORDER — SENNOSIDES-DOCUSATE SODIUM 8.6-50 MG PO TABS
1.0000 | ORAL_TABLET | Freq: Two times a day (BID) | ORAL | Status: DC
  Administered 2020-09-26 – 2020-09-30 (×9): 1 via ORAL
  Filled 2020-09-25 (×9): qty 1

## 2020-09-25 MED ORDER — ONDANSETRON HCL 4 MG/2ML IJ SOLN
4.0000 mg | Freq: Four times a day (QID) | INTRAMUSCULAR | Status: DC | PRN
  Administered 2020-09-25: 4 mg via INTRAVENOUS
  Filled 2020-09-25: qty 2

## 2020-09-25 MED ORDER — POLYETHYLENE GLYCOL 3350 17 G PO PACK
17.0000 g | PACK | Freq: Every day | ORAL | Status: DC
  Administered 2020-09-25 – 2020-09-30 (×6): 17 g via ORAL
  Filled 2020-09-25 (×6): qty 1

## 2020-09-25 NOTE — ED Notes (Signed)
Report messaged to [employer]

## 2020-09-25 NOTE — Progress Notes (Signed)
Patients BP 192/81, MD aware, PRN hydralazine ordered. Attempted to give patient POs. Patient will not take a sip of water at this time and keeps repeating, "no" and "9:30". PRN IV hydralazine given.

## 2020-09-25 NOTE — ED Notes (Signed)
Safety sitter bedside

## 2020-09-25 NOTE — H&P (Addendum)
Check     [place]   PATIENT NAME: Courtney Lynn    MR#:  [id number]  DATE OF BIRTH:  [birth year]  DATE OF ADMISSION:  09/24/2020  PRIMARY CARE PHYSICIAN: Tracie Harrier, MD   Patient is coming from: SNF.  REQUESTING/REFERRING PHYSICIAN: Rudene Re, MD  CHIEF COMPLAINT:   Chief Complaint  Patient presents with   Altered Mental Status    HISTORY OF PRESENT ILLNESS:  Courtney Lynn is a 80 y.o. Caucasian female with medical history significant for dementia, breast cancer hypertension, depression, mitral valve prolapse, GERD and sleep apnea on CPAP as well as prediabetes, who presented to the emergency room from her skilled nursing facility with acute onset of altered mental status with confusion.  The patient cannot provide any history during the interview.  She kept saying "I need something because I need something".  She earlier kept saying "I need to get out of here I cannot stay here".  No reported fever or chills.  No reported nausea or vomiting or abdominal pain.  No reported chest pain or palpitations.  No reported urinary symptoms.  ED Course: Upon presentation to the emergency room, blood pressure was 174/63 with otherwise normal vital signs.  Blood pressure has been as high as 229/89.  Labs revealed mild hyponatremia 134 and BUN was 36 with creatinine 1.09.  Total protein was 6.2.  CBC showed anemia slightly lower than previous levels.  Influenza antigens and COVID-19 PCR came back negative.  UA was unremarkable.  Urine drug screen was negative.  EKG as reviewed by me : showed sinus rhythm with rate of 60 with borderline prolonged PR interval and Q waves anterolaterally Imaging: Noncontrasted head CT scan revealed the following: Acute to subacute cortical infarct within the a left temporoparietal cortex in keeping with a left posterior MCA territory infarct. Possible acute to subacute right cerebellar hemispheric infarct. No superimposed acute hemorrhage. No  significant associated mass effect. MRI examination is currently pending.  The patient was given 4 baby aspirin.  She will be admitted to a medical monitored bed for further evaluation and management.   PAST MEDICAL HISTORY:   Past Medical History:  Diagnosis Date   Anemia    Arthritis    Body mass index (bmi) 37.0-37.9, adult    Breast cancer ([place]) 01/02/2016   right breast/radiation   Cancer ([place]) 2017   BREAST   Chronic insomnia    Complication of anesthesia    has difficulty last surgery intubating   Depression    Difficult intubation    WITH TKR   Dizziness    Dysrhythmia    PVC'S,BRADYCARDIA   GERD (gastroesophageal reflux disease)    History of chicken pox    Hypertension    Hyponatremia    Intermittent vertigo    Irregular heart beat    Memory difficulties    Mild mitral regurgitation    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HCC)    MVP (mitral valve prolapse)    Orthopnea    Personal history of radiation therapy 2017   Pre-diabetes    PVC's (premature ventricular contractions)    Reflux    Sleep apnea    CPAP    PAST SURGICAL HISTORY:   Past Surgical History:  Procedure Laterality Date   APPENDECTOMY     BREAST BIOPSY Right 01/02/2016   PARTIAL MASTECTOMY- [facility] and [facility]   BREAST LUMPECTOMY Right 2017   CATARACT EXTRACTION W/PHACO Right 09/14/2016   Procedure: CATARACT EXTRACTION PHACO AND INTRAOCULAR LENS PLACEMENT (  [place]);  Surgeon: Birder Robson, MD;  Location: [facility] ORS;  Service: Ophthalmology;  Laterality: Right;  [place] 00:32 AP% 19.5 CDE 6.28 Fluid pack lot # [id number]   CATARACT EXTRACTION W/PHACO Left 10/05/2016   Procedure: CATARACT EXTRACTION PHACO AND INTRAOCULAR LENS PLACEMENT ([facility]);  Surgeon: Birder Robson, MD;  Location: [facility] ORS;  Service: Ophthalmology;  Laterality: Left;  [place] 00:46.9 AP% 16.0 CDE 7.48 Fluid Pack lot # [id number]   CHOLECYSTECTOMY     COLONOSCOPY WITH PROPOFOL N/A 08/24/2016   Procedure: COLONOSCOPY WITH PROPOFOL;  Surgeon:  Lollie Sails, MD;  Location: [facility] ENDOSCOPY;  Service: Endoscopy;  Laterality: N/A;   COLONOSCOPY WITH PROPOFOL N/A 07/12/2018   Procedure: COLONOSCOPY WITH PROPOFOL;  Surgeon: Toledo, Benay Pike, MD;  Location: [facility] ENDOSCOPY;  Service: Gastroenterology;  Laterality: N/A;   dental implants     FOOT SURGERY Right    FRACTURE SURGERY     KNEE CAP   HEMORRHOID SURGERY N/A 04/19/2019   Procedure: HEMORRHOIDECTOMY;  Surgeon: Benjamine Sprague, DO;  Location: [facility] ORS;  Service: General;  Laterality: N/A;   JOINT REPLACEMENT Right 2011   KNEE   PARTIAL MASTECTOMY WITH NEEDLE LOCALIZATION Right 01/02/2016   Procedure: PARTIAL MASTECTOMY WITH NEEDLE LOCALIZATION;  Surgeon: Leonie Green, MD;  Location: [facility] ORS;  Service: General;  Laterality: Right;   PATELLA FRACTURE SURGERY     REPLACEMENT TOTAL KNEE Right    SENTINEL NODE BIOPSY Right 01/02/2016   Procedure: SENTINEL NODE BIOPSY;  Surgeon: Leonie Green, MD;  Location: [facility] ORS;  Service: General;  Laterality: Right;   TONSILLECTOMY      SOCIAL HISTORY:   Social History   Tobacco Use   Smoking status: Former   Smokeless tobacco: Never   Tobacco comments:    Quit 1969  Substance Use Topics   Alcohol use: Yes    Alcohol/week: 7.0 standard drinks    Types: 7 Glasses of wine per week    Comment: OCCASIONALLY    FAMILY HISTORY:   Family History  Problem Relation Age of Onset   GI Bleed Mother    Diabetes Father    Congestive Heart Failure Father    Breast cancer Neg Hx     DRUG ALLERGIES:   Allergies  Allergen Reactions   Cleocin [Clindamycin Hcl] Swelling, Rash and Other (See Comments)    Tongue, lip swelling Tongue, lip swelling Tongue, lip swelling   Pen Vk [Penicillin V] Swelling, Rash and Other (See Comments)    Tongue, lip swelling Funny feeling on the tongue  Has patient had a PCN reaction causing immediate rash, facial/tongue/throat swelling, SOB or lightheadedness with hypotension:Yes Has patient  had a PCN reaction causing severe rash involving mucus membranes or skin necrosis:No Has patient had a PCN reaction that required hospitalization:No Has patient had a PCN reaction occurring within the last 10 years:No If all of the above answers are "NO", then may proceed with Cephalosporin use.     REVIEW OF SYSTEMS:   ROS As per history of present illness. All pertinent systems were reviewed above. Constitutional, HEENT, cardiovascular, respiratory, GI, GU, musculoskeletal, neuro, psychiatric, endocrine, integumentary and hematologic systems were reviewed and are otherwise negative/unremarkable except for positive findings mentioned above in the HPI.   MEDICATIONS AT HOME:   Prior to Admission medications   Medication Sig Start Date End Date Taking? Authorizing Provider  acetaminophen (TYLENOL) 500 MG tablet Take 1,000 mg by mouth at bedtime.   Yes [provider]  citalopram (CELEXA) 10 MG tablet  Take 10 mg by mouth every other day.   Yes [provider]  docusate sodium (COLACE) 100 MG capsule Take 100 mg by mouth daily.    Yes [provider]  donepezil (ARICEPT) 10 MG tablet Take 10 mg by mouth at bedtime.   Yes [provider]  felodipine (PLENDIL) 5 MG 24 hr tablet Take 5 mg by mouth daily.   Yes [provider]  gabapentin (NEURONTIN) 100 MG capsule Take 200 mg by mouth at bedtime.   Yes [provider]  hydrALAZINE (APRESOLINE) 50 MG tablet Take 50 mg by mouth 2 (two) times daily. 03/21/19  Yes [provider]  iron polysaccharides (NIFEREX) 150 MG capsule Take 150 mg by mouth daily.   Yes [provider]  lactase (LACTAID) 3000 units tablet Take 1 tablet by mouth daily.   Yes [provider]  methocarbamol (ROBAXIN) 500 MG tablet Take 500 mg by mouth at bedtime.   Yes [provider]  Multiple Vitamin (MULTIVITAMIN) tablet Take 1 tablet by mouth 2 (two) times daily.   Yes [provider]  olmesartan (BENICAR) 40 MG tablet Take 40 mg by mouth daily.  03/25/18  Yes [provider]  omeprazole (PRILOSEC) 20 MG capsule Take 20 mg by mouth daily.   Yes [provider]  OVER THE COUNTER MEDICATION Take 1 capsule by mouth daily. **Shaklee OmegaGuard**   Yes [provider]  OVER THE COUNTER MEDICATION Take 1 capsule by mouth 2 (two) times daily. **Omega Q Plus**   Yes [provider]  OVER THE COUNTER MEDICATION Take 1 tablet by mouth daily. **OptiBiotic**   Yes [provider]  OVER THE COUNTER MEDICATION Take 2 capsules by mouth daily. **Shaklee OsteoMatrix**   Yes [provider]  OVER THE COUNTER MEDICATION Take 1 capsule by mouth daily. **LifeVantage Protandim**   Yes [provider]  OVER THE COUNTER MEDICATION Take 2 tablets by mouth daily with lunch. **Shaklee Blood Pressure**   Yes [provider]  Wheat Dextrin (BENEFIBER) CHEW Chew 1 tablet by mouth daily.   Yes [provider]  doxycycline (VIBRA-TABS) 100 MG tablet Take 1 tablet (100 mg total) by mouth every 12 (twelve) hours. Patient not taking: Reported on 09/25/2020 07/14/20   Edwin Dada, MD  HYDROcodone-acetaminophen (NORCO/VICODIN) 5-325 MG tablet Take 1-2 tablets by mouth every 4 (four) hours as needed for moderate pain. Patient not taking: Reported on 09/25/2020 07/14/20   Edwin Dada, MD  meloxicam (MOBIC) 15 MG tablet Take 15 mg by mouth daily. Patient not taking: Reported on 09/25/2020    [provider]      VITAL SIGNS:  Blood pressure (!) 229/89, pulse 75, temperature 98.3 F (36.8 C), temperature source Oral, resp. rate 12, height '5\' 1"'$  (1.549 m), weight 84 kg, SpO2 98 %.  PHYSICAL EXAMINATION:  Physical Exam  GENERAL:  80 y.o.-year-old globally confused Caucasian female patient lying in the bed, restless and slightly agitated. EYES: Pupils equal, round, reactive to light and  accommodation. No scleral icterus. Extraocular muscles intact.  HEENT: Head atraumatic, normocephalic. Oropharynx and nasopharynx clear.  NECK:  Supple, no jugular venous distention. No thyroid enlargement, no tenderness.  LUNGS: Normal breath sounds bilaterally, no wheezing, rales,rhonchi or crepitation. No use of accessory muscles of respiration.  CARDIOVASCULAR: Regular rate and rhythm, S1, S2 normal. No murmurs, rubs, or gallops.  ABDOMEN: Soft, nondistended, nontender. Bowel sounds present. No organomegaly or mass.  EXTREMITIES: No pedal edema, cyanosis,  or clubbing.  NEUROLOGIC: The patient was globally confused.  She was alert and disoriented x3.  She was moving all 4 extremities and exam was grossly nonfocal.  She had no facial droop or pronator drift.  Gait was not tested. PSYCHIATRIC: The patient is alert and disoriented x3 with abnormal affect, agitation and restlessness with no good eye contact.   SKIN: No obvious rash, lesion, or ulcer.   LABORATORY PANEL:   CBC Recent Labs  Lab 09/24/20 2245  WBC 6.5  HGB 11.5*  HCT 32.9*  PLT 367   ------------------------------------------------------------------------------------------------------------------  Chemistries  Recent Labs  Lab 09/24/20 2245  NA 134*  K 4.9  CL 100  CO2 29  GLUCOSE 113*  BUN 36*  CREATININE 1.09*  CALCIUM 8.7*  AST 25  ALT 19  ALKPHOS 63  BILITOT 0.5   ------------------------------------------------------------------------------------------------------------------  Cardiac Enzymes No results for input(s): TROPONINI in the last 168 hours. ------------------------------------------------------------------------------------------------------------------  RADIOLOGY:  CT HEAD WO CONTRAST (5MM)  Result Date: 09/24/2020 CLINICAL DATA:  Altered mental status EXAM: CT HEAD WITHOUT CONTRAST TECHNIQUE: Contiguous axial images were obtained from the base of the skull through the vertex without  intravenous contrast. COMPARISON:  07/13/2020 FINDINGS: Brain: Normal anatomic configuration of the brain. There is effacement of the normal gray-white matter differentiation in keeping with an acute to subacute left temporoparietal cortical infarct. Minimal mass effect with effacement of the overlying sulci. No superimposed acute intracranial hemorrhage. There is interval development of a focal region of low attenuation within the right cerebellar hemisphere without significant associated mass effect or acute hemorrhage suspicious for an acute to subacute infarct. Evaluation, however, is slightly limited by motion artifact and streak artifact. Mild parenchymal volume loss is commensurate with the patient's age. Asymmetrically more severe atrophy involving the anterior temporal lobes is again noted, unchanged. No abnormal intra or extra-axial mass lesion or fluid collection. No midline shift. Ventricular size is normal. Vascular: No asymmetric hyperdense vasculature at the skull base. Skull: Intact Sinuses/Orbits: The orbits are unremarkable. Paranasal sinuses are clear save for a mucous retention cyst partially visualized within the left maxillary sinus. Other: Mastoid air cells and middle ear cavities are clear. IMPRESSION: Acute to subacute cortical infarct within the a left temporoparietal cortex in keeping with a left posterior MCA territory infarct. Possible acute to subacute right cerebellar hemispheric infarct. No superimposed acute hemorrhage. No significant associated mass effect. MRI examination may be helpful for confirmation. These results were called by telephone at the time of interpretation on 09/24/2020 at 11:35 pm to provider [facility] , who verbally acknowledged these results. Electronically Signed   By: Fidela Salisbury M.D.   On: 09/24/2020 23:39      IMPRESSION AND PLAN:  Active Problems:   Acute encephalopathy  1.  Acute to subacute left temporal parietal cortical infarct in  keeping with left posterior MCA territory infarction as well as suspected acute to subacute right cerebellar hemispheric infarct.  The patient has acute encephalopathy. - The patient will be admitted to a medical monitored bed. - We will follow neurochecks every 4 hours for 24 hours. - Neurology consult to be obtained. - PT/OT and ST consults will be obtained. - Brain MRI is currently pending and we will obtain bilateral carotid Doppler. - Other normal 2D echo on 07/14/2020 that revealed an EF of 55 to 60%. - The patient will be placed on aspirin as well as statin therapy. - I notified Dr. Rory Percy about the patient.  2.  Essential hypertension. -  We will continue her Plendil and Toprol-XL as well as ARB therapy with permissive parameters.  3.  GERD. - We will continue PPI therapy.  4.  Peripheral neuropathy. - We will continue Neurontin.  5.  Depression. - We will continue Celexa.  6.  Dementia. - We will continue Aricept.  7.  Prediabetes. - Fingerstick blood glucose measures and cover with sensitive NovoLog protocol. - We will check hemoglobin A1c level.  DVT prophylaxis: Lovenox.  Code Status: full code.  Family Communication:  The plan of care was discussed in details with the patient (and family). I answered all questions. The patient agreed to proceed with the above mentioned plan. Further management will depend upon hospital course. Disposition Plan: Back to previous home environment Consults called: Neurology. All the records are reviewed and case discussed with ED provider.  Status is: Inpatient  Remains inpatient appropriate because:Altered mental status, Ongoing diagnostic testing needed not appropriate for outpatient work up, Unsafe d/c plan, IV treatments appropriate due to intensity of illness or inability to take PO, and Inpatient level of care appropriate due to severity of illness  Dispo: The patient is from: SNF              Anticipated d/c is to: SNF               Patient currently is not medically stable to d/c.   Difficult to place patient No   TOTAL TIME TAKING CARE OF THIS PATIENT: 55 minutes.    Christel Mormon M.D on 09/25/2020 at 1:38 AM  [facility]   From 7 PM-7 AM, contact night-coverage [email or web address]  CC: Primary care physician; Tracie Harrier, MD

## 2020-09-25 NOTE — ED Notes (Signed)
Informed RN bed assigned 

## 2020-09-25 NOTE — ED Notes (Signed)
Pt incont of urine; clean gown in place and new brief applied.

## 2020-09-25 NOTE — ED Notes (Signed)
This tech sitting with patient for safety

## 2020-09-25 NOTE — Progress Notes (Signed)
OT Cancellation Note  Patient Details Name: SILAH KERCHNER MRN: [id number] DOB: 08-08-1940   Cancelled Treatment:    Reason Eval/Treat Not Completed: Patient at procedure or test/ unavailable. Consult received, chart reviewed. Upon attempt, pt out of the room. Will re-attempt OT evaluation at later time as pt is available and appropriate.   Hanley Hays, MPH, MS, OTR/L ascom [phone] 09/25/20, 9:48 AM

## 2020-09-25 NOTE — Consult Note (Signed)
Neurology Consultation  Reason for Consult: Altered mental status, stroke Referring Physician: Dr. Sidney Ace  CC: Altered mental status History is obtained from: Chart  HPI: Courtney Lynn is a 80 y.o. female past medical history of breast cancer, depression, dysrhythmia/bradycardia, hypertension, sleep apnea, presenting from a facility for evaluation of increasing altered mental status. She was noted to repeat certain words and sentences at the facility which is not like her baseline.  I was unable to get hold of any family member or the facility at this time to provide me any clear last known well or her baseline. Patient is unable to provide any meaningful history at this time secondary to her aphasia. She was seen and evaluated in the emergency room, and due to the altered mental status, brain imaging was obtained-CT head which revealed a acute to subacute infarct in the left parietotemporal area with no superimposed acute hemorrhage.  No significant mass-effect. Neurological consultation was obtained for the stroke.  ---- Later on I was able to speak with the brother over the phone-who reports that she had been in independent living up until a few months ago but then started showing signs of dementia and was moved to assisted living.  She had stopped driving but she could still take care of her ADLs, toileting etc.  He does not really know when this acute change happened but he got a call somewhere around 8:45 PM yesterday when she was on route to the hospital to let them know that she was going to the hospital.   LKW: Unable to ascertain tpa given?: no, unclear last known well Premorbid modified Rankin scale (mRS): 2  ROS: Unable to obtain due to altered mental status.   Past Medical History:  Diagnosis Date   Anemia    Arthritis    Body mass index (bmi) 37.0-37.9, adult    Breast cancer ([place]) 01/02/2016   right breast/radiation   Cancer ([place]) 2017   BREAST   Chronic insomnia     Complication of anesthesia    has difficulty last surgery intubating   Depression    Difficult intubation    WITH TKR   Dizziness    Dysrhythmia    PVC'S,BRADYCARDIA   GERD (gastroesophageal reflux disease)    History of chicken pox    Hypertension    Hyponatremia    Intermittent vertigo    Irregular heart beat    Memory difficulties    Mild mitral regurgitation    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HCC)    MVP (mitral valve prolapse)    Orthopnea    Personal history of radiation therapy 2017   Pre-diabetes    PVC's (premature ventricular contractions)    Reflux    Sleep apnea    CPAP        Family History  Problem Relation Age of Onset   GI Bleed Mother    Diabetes Father    Congestive Heart Failure Father    Breast cancer Neg Hx      Social History:   reports that she has quit smoking. She has never used smokeless tobacco. She reports current alcohol use of about 7.0 standard drinks per week. She reports that she does not use drugs.  Medications  Current Facility-Administered Medications:     stroke: mapping our early stages of recovery book, , Does not apply, Once, Mansy, Jan A, MD   acetaminophen (TYLENOL) tablet 650 mg, 650 mg, Oral, Q6H PRN **OR** acetaminophen (TYLENOL) suppository 650 mg, 650 mg, Rectal,  Q6H PRN, Mansy, Jan A, MD   aspirin EC tablet 325 mg, 325 mg, Oral, Daily, Mansy, Jan A, MD   aspirin suppository 300 mg, 300 mg, Rectal, Daily, Sreenath, Sudheer B, MD   atorvastatin (LIPITOR) tablet 10 mg, 10 mg, Oral, Daily, Mansy, Jan A, MD   citalopram (CELEXA) tablet 10 mg, 10 mg, Oral, QODAY, Mansy, Jan A, MD   docusate sodium (COLACE) capsule 100 mg, 100 mg, Oral, Daily, Mansy, Jan A, MD   donepezil (ARICEPT) tablet 10 mg, 10 mg, Oral, QHS, Mansy, Jan A, MD   enoxaparin (LOVENOX) injection 40 mg, 40 mg, Subcutaneous, Q24H, Mansy, Jan A, MD, 40 mg at 09/25/20 0757   felodipine (PLENDIL) 24 hr tablet 5 mg, 5 mg, Oral, Daily, Mansy, Jan A, MD   gabapentin  (NEURONTIN) capsule 200 mg, 200 mg, Oral, QHS, Mansy, Jan A, MD   hydrALAZINE (APRESOLINE) injection 10 mg, 10 mg, Intravenous, Q4H PRN, Priscella Mann, Sudheer B, MD, 10 mg at 09/25/20 [id number]   hydrALAZINE (APRESOLINE) tablet 50 mg, 50 mg, Oral, Q8H, Sreenath, Sudheer B, MD   insulin aspart (novoLOG) injection 0-9 Units, 0-9 Units, Subcutaneous, TID AC & HS, Mansy, Jan A, MD   irbesartan (AVAPRO) tablet 37.5 mg, 37.5 mg, Oral, Daily, Mansy, Jan A, MD   iron polysaccharides (NIFEREX) capsule 150 mg, 150 mg, Oral, Daily, Mansy, Jan A, MD   lactase (LACTAID) tablet 3,000 Units, 1 tablet, Oral, Daily, Mansy, Jan A, MD   magnesium hydroxide (MILK OF MAGNESIA) suspension 30 mL, 30 mL, Oral, Daily PRN, Mansy, Jan A, MD   methocarbamol (ROBAXIN) tablet 500 mg, 500 mg, Oral, QHS, Mansy, Jan A, MD   multivitamin with minerals tablet 1 tablet, 1 tablet, Oral, BID, Mansy, Jan A, MD   ondansetron (ZOFRAN) tablet 4 mg, 4 mg, Oral, Q6H PRN **OR** ondansetron (ZOFRAN) injection 4 mg, 4 mg, Intravenous, Q6H PRN, Mansy, Jan A, MD, 4 mg at 09/25/20 0209   pantoprazole (PROTONIX) EC tablet 40 mg, 40 mg, Oral, Daily, Mansy, Jan A, MD   polycarbophil (FIBERCON) tablet 625 mg, 625 mg, Oral, Daily, Mansy, Jan A, MD   traZODone (DESYREL) tablet 25 mg, 25 mg, Oral, QHS PRN, Mansy, Jan A, MD  Current Outpatient Medications:    acetaminophen (TYLENOL) 500 MG tablet, Take 1,000 mg by mouth at bedtime., Disp: , Rfl:    citalopram (CELEXA) 10 MG tablet, Take 10 mg by mouth every other day., Disp: , Rfl:    docusate sodium (COLACE) 100 MG capsule, Take 100 mg by mouth daily. , Disp: , Rfl:    donepezil (ARICEPT) 10 MG tablet, Take 10 mg by mouth at bedtime., Disp: , Rfl:    felodipine (PLENDIL) 5 MG 24 hr tablet, Take 5 mg by mouth daily., Disp: , Rfl:    gabapentin (NEURONTIN) 100 MG capsule, Take 200 mg by mouth at bedtime., Disp: , Rfl:    hydrALAZINE (APRESOLINE) 50 MG tablet, Take 50 mg by mouth 2 (two) times daily., Disp: ,  Rfl:    iron polysaccharides (NIFEREX) 150 MG capsule, Take 150 mg by mouth daily., Disp: , Rfl:    lactase (LACTAID) 3000 units tablet, Take 1 tablet by mouth daily., Disp: , Rfl:    methocarbamol (ROBAXIN) 500 MG tablet, Take 500 mg by mouth at bedtime., Disp: , Rfl:    Multiple Vitamin (MULTIVITAMIN) tablet, Take 1 tablet by mouth 2 (two) times daily., Disp: , Rfl:    olmesartan (BENICAR) 40 MG tablet, Take 40 mg by mouth  daily. , Disp: , Rfl:    omeprazole (PRILOSEC) 20 MG capsule, Take 20 mg by mouth daily., Disp: , Rfl:    OVER THE COUNTER MEDICATION, Take 1 capsule by mouth daily. **Shaklee OmegaGuard**, Disp: , Rfl:    OVER THE COUNTER MEDICATION, Take 1 capsule by mouth 2 (two) times daily. **Omega Q Plus**, Disp: , Rfl:    OVER THE COUNTER MEDICATION, Take 1 tablet by mouth daily. **OptiBiotic**, Disp: , Rfl:    OVER THE COUNTER MEDICATION, Take 2 capsules by mouth daily. **Shaklee OsteoMatrix**, Disp: , Rfl:    OVER THE COUNTER MEDICATION, Take 1 capsule by mouth daily. **LifeVantage Protandim**, Disp: , Rfl:    OVER THE COUNTER MEDICATION, Take 2 tablets by mouth daily with lunch. **Shaklee Blood Pressure**, Disp: , Rfl:    Wheat Dextrin (BENEFIBER) CHEW, Chew 1 tablet by mouth daily., Disp: , Rfl:    doxycycline (VIBRA-TABS) 100 MG tablet, Take 1 tablet (100 mg total) by mouth every 12 (twelve) hours. (Patient not taking: Reported on 09/25/2020), Disp: 12 tablet, Rfl: 0   HYDROcodone-acetaminophen (NORCO/VICODIN) 5-325 MG tablet, Take 1-2 tablets by mouth every 4 (four) hours as needed for moderate pain. (Patient not taking: Reported on 09/25/2020), Disp: 8 tablet, Rfl: 0   meloxicam (MOBIC) 15 MG tablet, Take 15 mg by mouth daily. (Patient not taking: Reported on 09/25/2020), Disp: , Rfl:    Exam: Current vital signs: BP (!) 192/81   Pulse 85   Temp 98.3 F (36.8 C) (Oral)   Resp 12   Ht '5\' 1"'$  (1.549 m)   Wt 84 kg   SpO2 99%   BMI 34.99 kg/m  Vital signs in last 24  hours: Temp:  [98.3 F (36.8 C)] 98.3 F (36.8 C) (08/25 0127) Pulse Rate:  [61-94] 85 (08/25 0800) Resp:  [12-26] 12 (08/25 0800) BP: (173-229)/(62-115) 192/81 (08/25 0800) SpO2:  [95 %-100 %] 99 % (08/25 0800) Weight:  [84 kg] 84 kg (08/24 2238) General: Awake, alert appears to be perseverating HEENT: Normocephalic/atraumatic CVS: Regular rhythm Respiratory: Chest clear Abdomen nondistended Neurological exam Awake, alert. Unable to tell me her name Keeps perseverating "930 930" in the loop. Does not follow simple commands but is able to keep her arms and legs up with passively raising them for some time-see below. Cannot repeat.  Cannot name. Cranial nerves: Pupils are equal round react to light, does not appear to have 100 extraocular movements but does appear to have diminished blinking to threat from the right, face appears grossly symmetric. Motor examination: Although she does not follow commands consistently, she appears to have full strength in the left upper and lower extremity and mild weakness in the right upper and lower extremity. Sensory exam: Grimaces to noxious stimulation consistently and withdraws equally in all 4 extremities. Coordination is difficult to assess due to her mentation NIH stroke scale 1a Level of Conscious.: 0 1b LOC Questions: 2 1c LOC Commands: 2 2 Best Gaze: 0 3 Visual: 1 4 Facial Palsy: 0 5a Motor Arm - left: 0 5b Motor Arm - Right: 1 6a Motor Leg - Left: 0 6b Motor Leg - Right: 1 7 Limb Ataxia: 0 8 Sensory: 0 9 Best Language: 2 10 Dysarthria: 0 11 Extinct. and Inatten.: 0 TOTAL: 9   Labs I have reviewed labs in epic and the results pertinent to this consultation are:   CBC    Component Value Date/Time   WBC 9.9 09/25/2020 0634   RBC 3.86 (L) 09/25/2020 [id number]  HGB 12.3 09/25/2020 0634   HCT 35.2 (L) 09/25/2020 0634   PLT 394 09/25/2020 0634   MCV 91.2 09/25/2020 0634   MCH 31.9 09/25/2020 0634   MCHC 34.9 09/25/2020 0634    RDW 13.7 09/25/2020 0634   LYMPHSABS 1.4 09/24/2020 2245   MONOABS 0.7 09/24/2020 2245   EOSABS 0.4 09/24/2020 2245   BASOSABS 0.1 09/24/2020 2245    CMP     Component Value Date/Time   NA 134 (L) 09/25/2020 0634   K 4.2 09/25/2020 0634   CL 102 09/25/2020 0634   CO2 24 09/25/2020 0634   GLUCOSE 111 (H) 09/25/2020 0634   BUN 30 (H) 09/25/2020 0634   CREATININE 0.93 09/25/2020 0634   CALCIUM 9.2 09/25/2020 0634   PROT 6.2 (L) 09/24/2020 2245   ALBUMIN 3.7 09/24/2020 2245   AST 25 09/24/2020 2245   ALT 19 09/24/2020 2245   ALKPHOS 63 09/24/2020 2245   BILITOT 0.5 09/24/2020 2245   GFRNONAA >60 09/25/2020 0634   GFRAA >60 12/22/2015 1228    Lipid Panel     Component Value Date/Time   CHOL 234 (H) 09/25/2020 0634   TRIG 50 09/25/2020 0634   HDL 62 09/25/2020 0634   CHOLHDL 3.8 09/25/2020 0634   VLDL 10 09/25/2020 0634   LDLCALC 162 (H) 09/25/2020 0634     Imaging I have reviewed the images obtained:  CT-head-left parieto-temporal hypodensity concerning for acute to subacute stroke.  MRI examination of the brain-acute infarct in the left temporoparietal cortex  Carotid duplex incomplete due to patient's abrupt termination of the examination prior to completion of the left carotid system and left vertebral artery.  Assessment:  80 year old past history of breast cancer, depression, dysrhythmia, hypertension, sleep apnea and dementia presenting for evaluation of increasing altered mental status with unclear last known well and according to family member who lives in [place], she has had cognitive decline that has been ongoing over the past few months but does not know when this acute change yesterday happen-not a candidate for tPA due to unknown last known well. Imaging reveals acute infarction of the left temporoparietal cortex. Her exam matches this with a moderate mixed aphasia and mild right-sided weakness.  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 If no clear source of the stroke is identified-we will need a 30-day heart monitor as an outpatient CT angiography of the head and neck when patient is able to tolerate.  Relayed the plan to the brother over the phone. Relayed the plan to Dr. Wendee Copp via secure chat.   -- Amie Portland, MD Neurologist [facility] Pager: [phone]

## 2020-09-25 NOTE — ED Notes (Signed)
fsbs 110

## 2020-09-25 NOTE — Evaluation (Signed)
Clinical/Bedside Swallow Evaluation Patient Details  Name: Courtney Lynn MRN: [id number] Date of Birth: 07/08/1940  Today's Date: 09/25/2020 Time: SLP Start Time (ACUTE ONLY): 79 SLP Stop Time (ACUTE ONLY): 1435 SLP Time Calculation (min) (ACUTE ONLY): 50 min  Past Medical History:  Past Medical History:  Diagnosis Date   Anemia    Arthritis    Body mass index (bmi) 37.0-37.9, adult    Breast cancer ([place]) 01/02/2016   right breast/radiation   Cancer ([place]) 2017   BREAST   Chronic insomnia    Complication of anesthesia    has difficulty last surgery intubating   Depression    Difficult intubation    WITH TKR   Dizziness    Dysrhythmia    PVC'S,BRADYCARDIA   GERD (gastroesophageal reflux disease)    History of chicken pox    Hypertension    Hyponatremia    Intermittent vertigo    Irregular heart beat    Memory difficulties    Mild mitral regurgitation    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HCC)    MVP (mitral valve prolapse)    Orthopnea    Personal history of radiation therapy 2017   Pre-diabetes    PVC's (premature ventricular contractions)    Reflux    Sleep apnea    CPAP   Past Surgical History:  Past Surgical History:  Procedure Laterality Date   APPENDECTOMY     BREAST BIOPSY Right 01/02/2016   PARTIAL MASTECTOMY- [facility] and DC[facility]   BREAST LUMPECTOMY Right 2017   CATARACT EXTRACTION W/PHACO Right 09/14/2016   Procedure: CATARACT EXTRACTION PHACO AND INTRAOCULAR LENS PLACEMENT ([place]);  Surgeon: Birder Robson, MD;  Location: [facility] ORS;  Service: Ophthalmology;  Laterality: Right;  [place] 00:32 AP% 19.5 CDE 6.28 Fluid pack lot # [id number]   CATARACT EXTRACTION W/PHACO Left 10/05/2016   Procedure: CATARACT EXTRACTION PHACO AND INTRAOCULAR LENS PLACEMENT ([facility]);  Surgeon: Birder Robson, MD;  Location: [facility] ORS;  Service: Ophthalmology;  Laterality: Left;  [place] 00:46.9 AP% 16.0 CDE 7.48 Fluid Pack lot # [id number]   CHOLECYSTECTOMY     COLONOSCOPY WITH PROPOFOL N/A  08/24/2016   Procedure: COLONOSCOPY WITH PROPOFOL;  Surgeon: Lollie Sails, MD;  Location: [facility] ENDOSCOPY;  Service: Endoscopy;  Laterality: N/A;   COLONOSCOPY WITH PROPOFOL N/A 07/12/2018   Procedure: COLONOSCOPY WITH PROPOFOL;  Surgeon: Toledo, Benay Pike, MD;  Location: [facility] ENDOSCOPY;  Service: Gastroenterology;  Laterality: N/A;   dental implants     FOOT SURGERY Right    FRACTURE SURGERY     KNEE CAP   HEMORRHOID SURGERY N/A 04/19/2019   Procedure: HEMORRHOIDECTOMY;  Surgeon: Benjamine Sprague, DO;  Location: [facility] ORS;  Service: General;  Laterality: N/A;   JOINT REPLACEMENT Right 2011   KNEE   PARTIAL MASTECTOMY WITH NEEDLE LOCALIZATION Right 01/02/2016   Procedure: PARTIAL MASTECTOMY WITH NEEDLE LOCALIZATION;  Surgeon: Leonie Green, MD;  Location: [facility] ORS;  Service: General;  Laterality: Right;   PATELLA FRACTURE SURGERY     REPLACEMENT TOTAL KNEE Right    SENTINEL NODE BIOPSY Right 01/02/2016   Procedure: SENTINEL NODE BIOPSY;  Surgeon: Leonie Green, MD;  Location: [facility] ORS;  Service: General;  Laterality: Right;   TONSILLECTOMY     HPI:  Per admitting H&P"Courtney Lynn is a 80 y.o. Caucasian female with medical history significant for dementia, breast cancer hypertension, depression, mitral valve prolapse, GERD and sleep apnea on CPAP as well as prediabetes, who presented to the emergency room from her skilled nursing facility with  acute onset of altered mental status with confusion.  The patient cannot provide any history during the interview.  She kept saying "I need something because I need something".  She earlier kept saying "I need to get out of here I cannot stay here".  No reported fever or chills.  No reported nausea or vomiting or abdominal pain.  No reported chest pain or palpitations.  No reported urinary symptoms.     ED Course: Upon presentation to the emergency room, blood pressure was 174/63 with otherwise normal vital signs.  Blood pressure has been as high as  229/89.  Labs revealed mild hyponatremia 134 and BUN was 36 with creatinine 1.09.  Total protein was 6.2.  CBC showed anemia slightly lower than previous levels.  Influenza antigens and COVID-19 PCR came back negative.  UA was unremarkable.  Urine drug screen was negative.     EKG as reviewed by me : showed sinus rhythm with rate of 60 with borderline prolonged PR interval and Q waves anterolaterally  Imaging: Noncontrasted head CT scan revealed the following:  Acute to subacute cortical infarct within the a left temporoparietal  cortex in keeping with a left posterior MCA territory infarct.  Possible acute to subacute right cerebellar hemispheric infarct. No  superimposed acute hemorrhage. No significant associated mass  effect. MRI examination is currently pending."   Assessment / Plan / Recommendation Clinical Impression  Bedside swallow eval reveal mild dysphagia but no overt s/s of aspiration with any tested consistency. MRI shows acute infarct in the left temporopariatal cortex. Pt presents with expressive and receptive aphasia and was not able to consistently follow commands for a thorough oral mech exam. Pt tolerated sips of thin liquids, bites of puree and solids with no s/s of aspiration. She was able to feed herself with some assistance. She was noted to be impulsive placing several bites of food in her mouth prior to clearing previous bites. Oral transit delay with solids and mild to moderate oral resiude after the swallow. Alternating liquids with solids heped clear oral residue. Pt was also noted to need to blow or wipe her nose several times throughout the meal. Rec diet downgrade to Dys 2 with thin liquids for now. Pt will need supervision and assistance with meals as well as cues to slow down. ST to follow for Dysphagia and toleration of diet. Pt will need full speech and language eval for Aphasia at discharge. Pt has apparently shown some improvement with speech from earlier today and is able  to produce occasional appropriate phrases. SLP Visit Diagnosis: Dysphagia, oropharyngeal phase (R13.12)    Aspiration Risk  Mild aspiration risk;Moderate aspiration risk    Diet Recommendation Dysphagia 2 (Fine chop)   Liquid Administration via: Cup;Straw Medication Administration: Whole meds with puree (Crush if needed.) Supervision: Staff to assist with self feeding;Full supervision/cueing for compensatory strategies Compensations: Minimize environmental distractions;Slow rate;Small sips/bites;Follow solids with liquid;Other (Comment) (cues to slow down) Postural Changes: Seated upright at 90 degrees;Remain upright for at least 30 minutes after po intake    Other  Recommendations     Follow up Recommendations Skilled Nursing facility      Frequency and Duration min 2x/week  1 week       Prognosis Prognosis for Safe Diet Advancement: Good Barriers to Reach Goals: Cognitive deficits;Language deficits      Swallow Study   General Date of Onset: 09/24/20 HPI: Per admitting H&P"Keymora E Vradenburg is a 80 y.o. Caucasian female with medical history significant for  dementia, breast cancer hypertension, depression, mitral valve prolapse, GERD and sleep apnea on CPAP as well as prediabetes, who presented to the emergency room from her skilled nursing facility with acute onset of altered mental status with confusion.  The patient cannot provide any history during the interview.  She kept saying "I need something because I need something".  She earlier kept saying "I need to get out of here I cannot stay here".  No reported fever or chills.  No reported nausea or vomiting or abdominal pain.  No reported chest pain or palpitations.  No reported urinary symptoms.     ED Course: Upon presentation to the emergency room, blood pressure was 174/63 with otherwise normal vital signs.  Blood pressure has been as high as 229/89.  Labs revealed mild hyponatremia 134 and BUN was 36 with creatinine 1.09.  Total  protein was 6.2.  CBC showed anemia slightly lower than previous levels.  Influenza antigens and COVID-19 PCR came back negative.  UA was unremarkable.  Urine drug screen was negative.     EKG as reviewed by me : showed sinus rhythm with rate of 60 with borderline prolonged PR interval and Q waves anterolaterally  Imaging: Noncontrasted head CT scan revealed the following:  Acute to subacute cortical infarct within the a left temporoparietal  cortex in keeping with a left posterior MCA territory infarct.  Possible acute to subacute right cerebellar hemispheric infarct. No  superimposed acute hemorrhage. No significant associated mass  effect. MRI examination is currently pending." Type of Study: Bedside Swallow Evaluation Diet Prior to this Study: Regular Temperature Spikes Noted: No Respiratory Status: Room air History of Recent Intubation: No Behavior/Cognition: Alert;Cooperative;Doesn't follow directions;Requires cueing;Impulsive;Other (Comment) (Tearful and slightly agitated) Vision: Functional for self-feeding Self-Feeding Abilities: Needs set up;Needs assist Patient Positioning: Upright in bed Baseline Vocal Quality: Normal    Oral/Motor/Sensory Function     Ice Chips     Thin Liquid Thin Liquid: Within functional limits Presentation: Cup;Self Fed;Straw    Nectar Thick Nectar Thick Liquid: Not tested   Honey Thick Honey Thick Liquid: Not tested   Puree Puree: Within functional limits Presentation: Spoon   Solid     Solid: Impaired Presentation: Self Fed (impulsive, needed cues to slow down) Oral Phase Impairments: Impaired mastication;Poor awareness of bolus Oral Phase Functional Implications: Prolonged oral transit;Oral residue      Courtney Lynn 09/25/2020,2:46 PM

## 2020-09-25 NOTE — ED Notes (Signed)
Attempted to perform swallow screen. Patient refused, stated "no, I don't want it".

## 2020-09-25 NOTE — ED Notes (Signed)
Pt sleeping. 

## 2020-09-25 NOTE — Progress Notes (Signed)
Brief hospitalist update note.  This is a nonbillable note.  Please see same-day H&P from Dr. Sidney Ace for full billable details.  Briefly, this is a 80 year old female who lives full-time assisted living with history of dementia, breast cancer, depression, dysrhythmia, hypertension, sleep apnea who presented for evaluation of increasing altered mentation.  Unknown last time well.  Significant cognitive decline noted by family members.  On admission MRI and CT imaging survey reveals acute infarct in the left temporoparietal cortex.  Patient has been intermittently agitated and aphasic.  Discussed with neurology.  Neurologic deficits are consistent with mixed moderate aphasia which is expected from infarct in the left temporoparietal cortex.  Plan: CT angiography head and neck when patient is able to tolerate Frequent neurochecks 2D echocardiogram Risk factor modification Therapy evaluations Progressive hypertension, treat only if systolic greater than [id number] May need 30-day event monitor if no clear stroke causes identified  Ralene Muskrat MD  No charge

## 2020-09-25 NOTE — ED Notes (Signed)
Unable to obtain nih stroke scale due to patient's mental status and ability to follow commands.

## 2020-09-25 NOTE — ED Notes (Signed)
Handoff/ Report to Daune Perch

## 2020-09-25 NOTE — Progress Notes (Signed)
Patient resting quietly in bed. Called MRI to see if patient could get MRI done while cooperative, per MRI, unable to get patient at this time. Will do it when they can.

## 2020-09-25 NOTE — Progress Notes (Signed)
PT Cancellation Note  Patient Details Name: Courtney Lynn MRN: [id number] DOB: Mar 28, 1940   Cancelled Treatment:    Reason Eval/Treat Not Completed: Other (comment) Chart reviewed, on first attempt to see pt she was out of room for MRI.  Later in discussion with nursing pt has been agitated, yelling, very confused and unable to follow instructions.  At this time pt again started yelling and nursing needed to tend to her.  Clearly unable to work with PT at this time, will maintain on caseload and follow from a distance, hope to be able to see her when mentation is more appropriate.  Nursing to notify this PT if pt is able to participate later this date.    Kreg Shropshire, DPT 09/25/2020, 10:56 AM

## 2020-09-25 NOTE — ED Notes (Signed)
Spoke to patient's medical power of atty and brother, Gloris Ham, [phone].

## 2020-09-25 NOTE — ED Notes (Signed)
Patient refused po meds.

## 2020-09-26 ENCOUNTER — Inpatient Hospital Stay
Admit: 2020-09-26 | Discharge: 2020-09-26 | Disposition: A | Discharge status: Home / Self Care | Payer: PPO | Attending: Student in an Organized Health Care Education/Training Program | Admitting: Student in an Organized Health Care Education/Training Program

## 2020-09-26 DIAGNOSIS — I63412 Cerebral infarction due to embolism of left middle cerebral artery: Secondary | ICD-10-CM

## 2020-09-26 LAB — ECHOCARDIOGRAM COMPLETE
AR max vel: 2.41 cm2
AV Area VTI: 2.52 cm2
AV Area mean vel: 2.57 cm2
AV Mean grad: 8 mmHg
AV Peak grad: 16.8 mmHg
Ao pk vel: 2.05 m/s
Area-P 1/2: 5.7 cm2
Height: 61 in
MV VTI: 3.11 cm2
S' Lateral: 2.46 cm
Weight: 2962.98 oz

## 2020-09-26 LAB — GLUCOSE, CAPILLARY
Glucose-Capillary: 119 mg/dL — ABNORMAL HIGH (ref 70–99)
Glucose-Capillary: 129 mg/dL — ABNORMAL HIGH (ref 70–99)
Glucose-Capillary: 117 mg/dL — ABNORMAL HIGH (ref 70–99)

## 2020-09-26 MED ORDER — CLOPIDOGREL BISULFATE 75 MG PO TABS
75.0000 mg | ORAL_TABLET | Freq: Every day | ORAL | Status: DC
  Administered 2020-09-26 – 2020-09-30 (×5): 75 mg via ORAL
  Filled 2020-09-26 (×5): qty 1

## 2020-09-26 MED ORDER — ZIPRASIDONE MESYLATE 20 MG IM SOLR
10.0000 mg | INTRAMUSCULAR | Status: DC | PRN
  Administered 2020-09-26 – 2020-09-27 (×2): 10 mg via INTRAMUSCULAR
  Filled 2020-09-26 (×3): qty 20

## 2020-09-26 NOTE — Evaluation (Signed)
Physical Therapy Evaluation Patient Details Name: Courtney Lynn MRN: [id number] DOB: [birth year] Today's Date: 09/26/2020   History of Present Illness  Pt is an 80 y/o F with PMH: BRCA, HTN, bradycardia, dementia, and pulmonary HTN. Adm for fall in June 2022. Pt BIB EMS from [facility]F d/t AMS. 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  Clinical Impression  Patient seated in recliner beginning of treatment session.  Alert and oriented to self, able to verbally state name.  Significant expressive and receptive aphasia noted; unable to consistently follow verbal commands, often requiring therapist demonstration and hand-over-hand assist to fully comprehend and initiate desired task.  Patient does attempt some verbalization, but only with automatic/spontaneous phrases; unable to effectively communicate wants/needs.  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  Bilat UE/LE strength and ROM grossly symmetrical and WFL for basic transfers and gait activities; mild sensory deficit apparent on R hemi-body, suspect mild inattention to R as well.  Able to complete bed mobility with cga/min assist; sit/stand, basic transfers and gait (30' x1, 10' x2) with RW, cga/min assist.  Demonstrates  reciprocal stepping pattern with fair step height/length; mild R lateral lean, suspect some degree of R inattention (bumps obstacles with RW, difficulty with visual awareness); hand-over-hand for RW management Would benefit from skilled PT to address above deficits and promote optimal return to PLOF.;recommend transition to STR upon discharge from acute hospitalization.     Follow Up Recommendations SNF    Equipment Recommendations  Rolling walker with 5" wheels    Recommendations for Other Services       Precautions / Restrictions Precautions Precautions: Fall Restrictions Weight Bearing Restrictions: No      Mobility  Bed Mobility Overal bed mobility: Needs Assistance Bed  Mobility: Supine to Sit;Sit to Supine     Supine to sit: Min guard Sit to supine: Min guard   General bed mobility comments: hand-over-hand to initiate movement transitions    Transfers Overall transfer level: Needs assistance Equipment used: Rolling walker (2 wheeled) Transfers: Sit to/from Stand Sit to Stand: Min guard;Min assist         General transfer comment: visual/tactile cues for initiation, sequence, safety (not as receptive to verbal cues)  Ambulation/Gait Ambulation/Gait assistance: Min guard;Min assist Gait Distance (Feet):  (30' x1, 10' x2) Assistive device: Rolling walker (2 wheeled)       General Gait Details: reciprocal stepping pattern with fair step height/length; mild R lateral lean, suspect some degree of R inattention (bumps obstacles with RW, difficulty with visual awareness); hand-over-hand for RW management  Stairs            Wheelchair Mobility    Modified Rankin (Stroke Patients Only)       Balance Overall balance assessment: Needs assistance Sitting-balance support: No upper extremity supported;Feet supported Sitting balance-Leahy Scale: Good     Standing balance support: Bilateral upper extremity supported Standing balance-Leahy Scale: Fair Standing balance comment: G static standing, F balance with fxl mobility, benefits from UE support, but she is able to alternate hands from walker.                             Pertinent Vitals/Pain Pain Score: 0-No pain    Home Living Family/patient expects to be discharged to:: Assisted living               Home Equipment: Walker - 4 wheels;Shower seat - built in;Cane -  single point Additional Comments: [facility] [employer]- pt lives on 2nd floor of ALF, but is able to utilize Media planner    Prior Function Level of Independence: Needs assistance   Gait / Transfers Assistance Needed: Pt is mod I with use of rollator (per previous OT note)  ADL's / Homemaking  Assistance Needed: Information obtained from chart review as pt with expressive and receptive difficulties. She was in ALF which assisted with HH IADLs including meals and cleaning. Pt reported previuosly managing meds herself (2MA) unclear if this is still the case. Pt no longer driving. Able to perform basic ADLs herself.        Hand Dominance   Dominant Hand: Right    Extremity/Trunk Assessment   Upper Extremity Assessment Upper Extremity Assessment:  (grossly at least 4-/5 throughout; question some degree of sensory deficit/inattention throughout R UE)    Lower Extremity Assessment Lower Extremity Assessment: Generalized weakness (grossly 4-/5 throughout bilat LEs)       Communication   Communication: Receptive difficulties;Expressive difficulties;HOH  Cognition Arousal/Alertness: Awake/alert Behavior During Therapy: WFL for tasks assessed/performed Overall Cognitive Status: No family/caregiver present to determine baseline cognitive functioning                                 General Comments: Pt with h/o dementia at baseline. Now presenting with expressive and receptive speech difficulties. Pt requires visual and tactile cues to follow simple one step commands and requires increased time to process. Pt follows ~50% simple one step cues. Does say some phrases that align with the current task, but otherwise, just appears to say rote phrases with spontaneous/automatic speech patterns      General Comments      Exercises Other Exercises Other Exercises: Toilet transfer, ambulatory with RW, cga/min assist; sit/stand from standard toilet, cga/min assist with grab bar. Standing balance at sink for hand hygiene, cga/close sup; min cuing/assist to sequence hand-washing   Assessment/Plan    PT Assessment Patient needs continued PT services  PT Problem List Decreased strength;Decreased range of motion;Decreased activity tolerance;Decreased balance;Decreased  mobility;Decreased knowledge of use of DME;Decreased safety awareness;Decreased knowledge of precautions;Cardiopulmonary status limiting activity       PT Treatment Interventions DME instruction;Gait training;Functional mobility training;Therapeutic activities;Therapeutic exercise;Balance training;Patient/family education;Neuromuscular re-education;Cognitive remediation    PT Goals (Current goals can be found in the Care Plan section)  Acute Rehab PT Goals PT Goal Formulation: Patient unable to participate in goal setting Time For Goal Achievement: 10/10/20 Potential to Achieve Goals: Fair    Frequency Min 2X/week   Barriers to discharge        Co-evaluation               AM-PAC PT "6 Clicks" Mobility  Outcome Measure Help needed turning from your back to your side while in a flat bed without using bedrails?: None Help needed moving from lying on your back to sitting on the side of a flat bed without using bedrails?: A Little Help needed moving to and from a bed to a chair (including a wheelchair)?: A Little Help needed standing up from a chair using your arms (e.g., wheelchair or bedside chair)?: A Little Help needed to walk in hospital room?: A Little Help needed climbing 3-5 steps with a railing? : A Little 6 Click Score: 19    End of Session Equipment Utilized During Treatment: Gait belt Activity Tolerance: Patient tolerated treatment well Patient left: in  bed;with call bell/phone within reach;with bed alarm set Nurse Communication: Mobility status PT Visit Diagnosis: Hemiplegia and hemiparesis Hemiplegia - Right/Left: Right Hemiplegia - dominant/non-dominant: Dominant Hemiplegia - caused by: Cerebral infarction    Time: 1333-1419 PT Time Calculation (min) (ACUTE ONLY): 46 min   Charges:   PT Evaluation $PT Eval Moderate Complexity: 1 Mod PT Treatments $Therapeutic Activity: 23-37 mins       Jovon Streetman H. Owens Shark, PT, DPT, NCS 09/26/20, 2:57  PM [phone]

## 2020-09-26 NOTE — Progress Notes (Addendum)
Neurology Progress Note   S:// Seen and examined.  No acute issues overnight   O:// Current vital signs: BP (!) 144/75 (BP Location: Right Arm)   Pulse 88   Temp 98.2 F (36.8 C) (Oral)   Resp 18   Ht '5\' 1"'$  (1.549 m)   Wt 84 kg   SpO2 100%   BMI 34.99 kg/m  Vital signs in last 24 hours: Temp:  [98.1 F (36.7 C)-99.2 F (37.3 C)] 98.2 F (36.8 C) (08/26 0700) Pulse Rate:  [79-108] 88 (08/26 0700) Resp:  [14-36] 18 (08/26 0700) BP: (144-191)/(68-83) 144/75 (08/26 0700) SpO2:  [91 %-100 %] 100 % (08/26 0700) General: Awake, alert appears to be perseverating at times also questionably hallucinating HEENT: Normocephalic/atraumatic CVS: Regular rhythm Respiratory: Chest clear Abdomen nondistended Neurological exam Awake, alert. Unable to tell me her name Keeps saying yes to everything. Does not follow simple commands but is able to keep her arms and legs up with passively raising them for some time-see below. Cannot repeat.  Cannot name. Cranial nerves: Pupils are equal round react to light, does not appear to have 100 extraocular movements but does appear to have diminished blinking to threat from the right, face appears grossly symmetric. Motor examination: Although she does not follow commands consistently, she appears to have full strength in the left upper and lower extremity and subtle weakness in the right upper and lower extremity. Sensory exam: Grimaces to noxious stimulation consistently and withdraws equally in all 4 extremities. Coordination is difficult to assess due to her mentation Essentially unchanged exam from yesterday  Medications  Current Facility-Administered Medications:     stroke: mapping our early stages of recovery book, , Does not apply, Once, Mansy, Jan A, MD   acetaminophen (TYLENOL) tablet 650 mg, 650 mg, Oral, Q6H PRN **OR** acetaminophen (TYLENOL) suppository 650 mg, 650 mg, Rectal, Q6H PRN, Mansy, Jan A, MD   aspirin suppository 300 mg,  300 mg, Rectal, Daily, Sreenath, Sudheer B, MD, 300 mg at 09/25/20 1213   atorvastatin (LIPITOR) tablet 80 mg, 80 mg, Oral, Daily, Sreenath, Sudheer B, MD, 80 mg at 09/25/20 2225   bisacodyl (DULCOLAX) suppository 10 mg, 10 mg, Rectal, Daily PRN, Priscella Mann, Sudheer B, MD   citalopram (CELEXA) tablet 10 mg, 10 mg, Oral, QODAY, Mansy, Jan A, MD   donepezil (ARICEPT) tablet 10 mg, 10 mg, Oral, QHS, Mansy, Jan A, MD, 10 mg at 09/25/20 2225   enoxaparin (LOVENOX) injection 40 mg, 40 mg, Subcutaneous, Q24H, Mansy, Jan A, MD, 40 mg at 09/25/20 0757   felodipine (PLENDIL) 24 hr tablet 5 mg, 5 mg, Oral, Daily, Mansy, Jan A, MD   gabapentin (NEURONTIN) capsule 200 mg, 200 mg, Oral, QHS, Mansy, Jan A, MD, 200 mg at 09/25/20 2225   haloperidol lactate (HALDOL) injection 2 mg, 2 mg, Intramuscular, Q6H PRN, Mansy, Jan A, MD, 2 mg at 09/26/20 [id number]   hydrALAZINE (APRESOLINE) injection 10 mg, 10 mg, Intravenous, Q4H PRN, Priscella Mann, Sudheer B, MD   hydrALAZINE (APRESOLINE) tablet 50 mg, 50 mg, Oral, BID, Sreenath, Sudheer B, MD, 50 mg at 09/25/20 2248   insulin aspart (novoLOG) injection 0-9 Units, 0-9 Units, Subcutaneous, TID AC & HS, Mansy, Jan A, MD   irbesartan (AVAPRO) tablet 37.5 mg, 37.5 mg, Oral, Daily, Mansy, Jan A, MD   iron polysaccharides (NIFEREX) capsule 150 mg, 150 mg, Oral, Daily, Mansy, Jan A, MD   lactase (LACTAID) tablet 3,000 Units, 1 tablet, Oral, Daily, Mansy, Jan A, MD   magnesium hydroxide (  MILK OF MAGNESIA) suspension 30 mL, 30 mL, Oral, Daily PRN, Mansy, Jan A, MD   methocarbamol (ROBAXIN) tablet 500 mg, 500 mg, Oral, QHS, Mansy, Jan A, MD   multivitamin with minerals tablet 1 tablet, 1 tablet, Oral, BID, Mansy, Jan A, MD, 1 tablet at 09/25/20 2225   ondansetron (ZOFRAN) tablet 4 mg, 4 mg, Oral, Q6H PRN **OR** ondansetron (ZOFRAN) injection 4 mg, 4 mg, Intravenous, Q6H PRN, Mansy, Jan A, MD, 4 mg at 09/25/20 0209   pantoprazole (PROTONIX) EC tablet 40 mg, 40 mg, Oral, Daily, Mansy, Jan A,  MD   polycarbophil (FIBERCON) tablet 625 mg, 625 mg, Oral, Daily, Mansy, Jan A, MD   polyethylene glycol (MIRALAX / GLYCOLAX) packet 17 g, 17 g, Oral, Daily, Sreenath, Sudheer B, MD, 17 g at 09/25/20 1343   senna-docusate (Senokot-S) tablet 1 tablet, 1 tablet, Oral, BID, Sreenath, Sudheer B, MD   traZODone (DESYREL) tablet 25 mg, 25 mg, Oral, QHS PRN, Mansy, Jan A, MD Labs CBC    Component Value Date/Time   WBC 9.9 09/25/2020 0634   RBC 3.86 (L) 09/25/2020 0634   HGB 12.3 09/25/2020 0634   HCT 35.2 (L) 09/25/2020 0634   PLT 394 09/25/2020 0634   MCV 91.2 09/25/2020 0634   MCH 31.9 09/25/2020 0634   MCHC 34.9 09/25/2020 0634   RDW 13.7 09/25/2020 0634   LYMPHSABS 1.4 09/24/2020 2245   MONOABS 0.7 09/24/2020 2245   EOSABS 0.4 09/24/2020 2245   BASOSABS 0.1 09/24/2020 2245    CMP     Component Value Date/Time   NA 134 (L) 09/25/2020 0634   K 4.2 09/25/2020 0634   CL 102 09/25/2020 0634   CO2 24 09/25/2020 0634   GLUCOSE 111 (H) 09/25/2020 0634   BUN 30 (H) 09/25/2020 0634   CREATININE 0.93 09/25/2020 0634   CALCIUM 9.2 09/25/2020 0634   PROT 6.2 (L) 09/24/2020 2245   ALBUMIN 3.7 09/24/2020 2245   AST 25 09/24/2020 2245   ALT 19 09/24/2020 2245   ALKPHOS 63 09/24/2020 2245   BILITOT 0.5 09/24/2020 2245   GFRNONAA >60 09/25/2020 0634   GFRAA >60 12/22/2015 1228    glycosylated hemoglobin-5.3  Lipid Panel     Component Value Date/Time   CHOL 234 (H) 09/25/2020 0634   TRIG 50 09/25/2020 0634   HDL 62 09/25/2020 0634   CHOLHDL 3.8 09/25/2020 0634   VLDL 10 09/25/2020 0634   LDLCALC 162 (H) 09/25/2020 0634   2D echo from 07/14/2020 LVEF 55 to 60%, no regional wall motion abnormalities, mitral valve not well-visualized, aortic valve not well visualized, left atrium size normal, interatrial septum not assessed.  Imaging I have reviewed images in epic and the results pertinent to this consultation are: MRI brain with left temporoparietal acute infarction. Temporal  lobe predominant atrophy-nonspecific could clinically seen in patients with dementia. CT angiography of the head with no large vessel occlusion.  Left P-comm with small P1 PCA, which might be an anatomical variant-with suspected superimposed moderate left P1 PCA stenosis.  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  Moderate left and mild right carotid bifurcation stenosis-less than 50%.  Assessment:  80 year old with past history of breast cancer, depression, dysrhythmia, hypertension, sleep apnea and dementia presenting for evaluation of increasing altered mental status with unclear last known well.  Noted to have left temporoparietal acute infarction. No emergent LVO, generalized atrophy along with more temporal atrophy-possibly correlating with the recent decline in her mentation and diagnosis of possible dementia. She also has intracranial atherosclerosis. No atrial fibrillation found on the monitor yet.  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  Suspect given severe aphasia, might require skilled nursing placement along with possible memory care unit given progressive dementia per family member's provided history. -Although she has a recent echocardiogram from June, I will obtain  repeat echocardiogram given the suspicion is high for cardioembolic source without any clear answer yet. -If after all the work-up, etiology still remains elusive, she will need least 30-day outpatient cardiac monitor to evaluate for paroxysmal atrial fibrillation` -Outpatient neurology follow-up in 4 to 6 weeks.  Plan discussed with Dr. Michelene Gardener  -- Amie Portland, MD Neurologist [facility] Pager: [phone]    Addendum Formal echo report pending transfer to epic. Preliminary report pasted from the cardiologist note. Left ventricular ejection fraction, by estimation, is 60 to 65%. The left ventricle has normal function. The left ventricle has no regional wall motion abnormalities. Left ventricular diastolic parameters were normal.Right ventricular systolic function was not well visualized. The right ventricular size is not well visualized.The mitral valve is normal in structure. Trivial mitral valve regurgitation. No evidence of mitral stenosis.The aortic valve is normal in structure. Aortic valve regurgitation is not visualized. No aortic stenosis is present.  Recommendations remain as above. Please call back with questions as needed.  -- Amie Portland, MD Neurologist [facility] Pager: [phone]

## 2020-09-26 NOTE — Progress Notes (Signed)
PROGRESS NOTE    Courtney Lynn  [id number] DOB: [birth year] DOA: 09/24/2020 PCP: Tracie Harrier, MD   Brief Narrative:   80 year old female who lives full-time assisted living with history of dementia, breast cancer, depression, dysrhythmia, hypertension, sleep apnea who presented for evaluation of increasing altered mentation.  Unknown last time well.  Significant cognitive decline noted by family members.  On admission MRI and CT imaging survey reveals acute infarct in the left temporoparietal cortex.  Patient has been intermittently agitated and aphasic.  Discussed with neurology.  Neurologic deficits are consistent with mixed moderate aphasia which is expected from infarct in the left temporoparietal cortex.  No emergent large vessel occlusion on CT and MRI imaging survey.  Noted for generalized atrophy consistent with cognitive decline.  Also evidence of intracranial atherosclerosis.  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 Unknown last time well, not a tPA candidate Plan: Appreciate neurology input and follow-up High intensity statin Dual antiplatelet therapy aspirin and Plavix for 3 months, followed by Plavix monotherapy indefinitely Therapy evaluations, patient was unable to tolerate yesterday May require skilled nursing facility placement 2D echocardiogram ordered and pending Permissive hypertension for today, normotensive blood pressure goal starting tomorrow Likely will need a 30-day event monitor to evaluate for PAF Patient neurology follow-up in 4 to 6 weeks Care plan discussed with neurology consultant Dr. Rory Percy  Dementia/cognitive decline Patient lives full-time at assisted living Previously in independent living but  cognitive decline noted over the past several months Will likely need skilled nursing care at time of discharge [facility] consulted Pending therapy evaluations Continue home Aricept  Essential hypertension Resumed home medications with permissive parameters  GERD PTA PPI  Peripheral neuropathy PTA Neurontin  Depression PTA Celexa  Prediabetes A1c reassuring       DVT prophylaxis: SQ Lovenox Code Status: Full Family Communication: None today Disposition Plan: Status is: Inpatient  Remains inpatient appropriate because:Inpatient level of care appropriate due to severity of illness  Dispo: The patient is from: ALF              Anticipated d/c is to: SNF              Patient currently is not medically stable to d/c.   Difficult to place patient No       Level of care: Med-Surg  Consultants:  Neurology  Procedures:  None  Antimicrobials:  None   Subjective: Seen and examined.  Sleeping this morning.  Intermittently agitated  Objective: Vitals:   09/25/20 2232 09/26/20 0113 09/26/20 0700 09/26/20 0925  BP: (!) 191/75 (!) 156/71 (!) 144/75 (!) 172/67  Pulse: 82 79 88 86  Resp: '18 18 18 20  '$ Temp: 98.1 F (36.7 C) 98.8 F (37.1 C) 98.2 F (36.8 C) 98 F (36.7 C)  TempSrc: Oral Axillary Oral Oral  SpO2: 97% 99% 100% 96%  Weight:      Height:       No intake or output data in the 24 hours ending 09/26/20 1053 Filed Weights   09/24/20 2238  Weight: 84 kg    Examination:  General exam: No acute distress Respiratory system: Clear to auscultation. Respiratory effort normal. Cardiovascular system: S1 & S2 heard, RRR. No JVD, murmurs, rubs, gallops or clicks. No pedal edema. Gastrointestinal system: Abdomen is  nondistended, soft and nontender. No organomegaly or masses felt. Normal bowel sounds heard. Central nervous system: Aphasic, and oriented to person.  No focal deficits Extremities: Decreased power symmetrically Skin: No rashes, lesions or  ulcers Psychiatry: Judgement and insight appear impaired. Mood & affect agitated.     Data Reviewed: I have personally reviewed following labs and imaging studies  CBC: Recent Labs  Lab 09/24/20 2245 09/25/20 0634  WBC 6.5 9.9  NEUTROABS 3.9  --   HGB 11.5* 12.3  HCT 32.9* 35.2*  MCV 94.3 91.2  PLT 367 [id number]   Basic Metabolic Panel: Recent Labs  Lab 09/24/20 2245 09/25/20 0634  NA 134* 134*  K 4.9 4.2  CL 100 102  CO2 29 24  GLUCOSE 113* 111*  BUN 36* 30*  CREATININE 1.09* 0.93  CALCIUM 8.7* 9.2   GFR: Estimated Creatinine Clearance: 46.7 mL/min (by C-G formula based on SCr of 0.93 mg/dL). Liver Function Tests: Recent Labs  Lab 09/24/20 2245  AST 25  ALT 19  ALKPHOS 63  BILITOT 0.5  PROT 6.2*  ALBUMIN 3.7   No results for input(s): LIPASE, AMYLASE in the last 168 hours. No results for input(s): AMMONIA in the last 168 hours. Coagulation Profile: No results for input(s): INR, PROTIME in the last 168 hours. Cardiac Enzymes: No results for input(s): CKTOTAL, CKMB, CKMBINDEX, TROPONINI in the last 168 hours. BNP (last 3 results) No results for input(s): PROBNP in the last 8760 hours. HbA1C: Recent Labs    09/25/20 0634  HGBA1C 5.3   CBG: Recent Labs  Lab 09/25/20 0751 09/25/20 1209 09/25/20 1653 09/25/20 2124 09/26/20 0928  GLUCAP 116* 114* 110* 109* 119*   Lipid Profile: Recent Labs    09/25/20 0634  CHOL 234*  HDL 62  LDLCALC 162*  TRIG 50  CHOLHDL 3.8   Thyroid Function Tests: No results for input(s): TSH, T4TOTAL, FREET4, T3FREE, THYROIDAB in the last 72 hours. Anemia Panel: No results for input(s): VITAMINB12, FOLATE, FERRITIN, TIBC, IRON, RETICCTPCT in the last 72 hours. Sepsis Labs: No results for input(s): PROCALCITON, LATICACIDVEN in the last 168 hours.  Recent Results (from the past 240 hour(s))  Resp Panel by RT-PCR (Flu A&B, Covid) Nasopharyngeal Swab     Status: None   Collection Time: 09/24/20 10:45 PM   Specimen:  Nasopharyngeal Swab; Nasopharyngeal(NP) swabs in vial transport medium  Result Value Ref Range Status   SARS Coronavirus 2 by RT PCR NEGATIVE NEGATIVE Final    Comment: (NOTE) SARS-CoV-2 target nucleic acids are NOT DETECTED.  The SARS-CoV-2 RNA is generally detectable in upper respiratory specimens during the acute phase of infection. The lowest concentration of SARS-CoV-2 viral copies this assay can detect is 138 copies/mL. A negative result does not preclude SARS-Cov-2 infection and should not be used as the sole basis for treatment or other patient management decisions. A negative result may occur with  improper specimen collection/handling, submission of specimen other than nasopharyngeal swab, presence of viral mutation(s) within the areas targeted by this assay, and inadequate number of viral copies(<138 copies/mL). A negative result must be combined with clinical observations, patient history, and epidemiological information. The expected result is Negative.  Fact Sheet for Patients:  EntrepreneurPulse.com.au  Fact Sheet for Healthcare Providers:  IncredibleEmployment.be  This test is no t yet approved or cleared by the [place] FDA and  has been authorized for detection and/or diagnosis of SARS-CoV-2 by FDA under an Emergency Use Authorization (EUA). This EUA will remain  in effect (meaning this  test can be used) for the duration of the COVID-19 declaration under Section 564(b)(1) of the Act, 21 U.S.C.section 360bbb-3(b)(1), unless the authorization is terminated  or revoked sooner.       Influenza A by PCR NEGATIVE NEGATIVE Final   Influenza B by PCR NEGATIVE NEGATIVE Final    Comment: (NOTE) The Xpert Xpress SARS-CoV-2/FLU/RSV plus assay is intended as an aid in the diagnosis of influenza from Nasopharyngeal swab specimens and should not be used as a sole basis for treatment. Nasal washings and aspirates are unacceptable for  Xpert Xpress SARS-CoV-2/FLU/RSV testing.  Fact Sheet for Patients: EntrepreneurPulse.com.au  Fact Sheet for Healthcare Providers: IncredibleEmployment.be  This test is not yet approved or cleared by the [place] FDA and has been authorized for detection and/or diagnosis of SARS-CoV-2 by FDA under an Emergency Use Authorization (EUA). This EUA will remain in effect (meaning this test can be used) for the duration of the COVID-19 declaration under Section 564(b)(1) of the Act, 21 U.S.C. section 360bbb-3(b)(1), unless the authorization is terminated or revoked.  Performed at [facility], [address] [place], [place] [address]          Radiology Studies: CT ANGIO HEAD NECK W WO CM  Result Date: 09/25/2020 CLINICAL DATA:  Stroke, follow up EXAM: CT ANGIOGRAPHY HEAD AND NECK TECHNIQUE: Multidetector CT imaging of the head and neck was performed using the standard protocol during bolus administration of intravenous contrast. Multiplanar CT image reconstructions and MIPs were obtained to evaluate the vascular anatomy. Carotid stenosis measurements (when applicable) are obtained utilizing NASCET criteria, using the distal internal carotid diameter as the denominator. CONTRAST:  73m OMNIPAQUE IOHEXOL 350 MG/ML SOLN COMPARISON:  09/25/2020. FINDINGS: CT HEAD FINDINGS Brain: Edema and loss of gray-white differentiation in the left temporoparietal cortex and subjacent white matter, compatible with known acute infarct. Similar stent relative to same day MRI. No progressive mass effect. No acute hemorrhage. Temporal lobe predominant atrophy with associated ex vacuo ventricular dilation of the temporal horns. No hydrocephalus, mass lesion, midline shift, or extra-axial fluid collection. Vascular: See below. Skull: No acute fracture. Sinuses: Left maxillary sinus retention cyst. Orbits: No acute finding. Review of the MIP images confirms the above  findings CTA NECK FINDINGS Aortic arch: Atherosclerosis.  Great vessel origins are patent. Right carotid system: Mild atherosclerosis at the carotid bifurcation and involving the proximal ICA without greater than 50% stenosis. Left carotid system: Moderate predominately calcific atherosclerosis at the carotid bifurcation without greater than 50% stenosis. Vertebral arteries: Codominant. Mild left vertebral artery origin stenosis. Streak artifact limits evaluation with potentially severe stenosis of the left vertebral artery at its dural margin and moderate stenosis of the right vertebral artery at C1-C2. moderate stenosis of the distal left intradural vertebral artery. Skeleton: Mild-to-moderate multilevel degenerative change with flowing anterior osteophytes in the visualized upper thoracic spine, potentially related to diffuse idiopathic skeletal hyperostosis. Other neck: Subcentimeter thyroid nodules bilaterally. No followup recommended (ref: J Am Coll Radiol. 2015 Feb;12(2): 143-50). Upper chest: Visualized lung apices are clear. Review of the MIP images confirms the above findings CTA HEAD FINDINGS Anterior circulation: Bilateral intracranial ICAs, MCAs, and ACAs are patent without a proximal hemodynamically significant stenosis. Posterior circulation: Suspected stenosis of the left vertebral artery at its dural margin, described above. Small left intradural vertebral artery. Right intradural vertebral artery, basilar artery, and bilateral posterior cerebral arteries are patent. Left posterior communicating artery with small left P1 PCA (anatomic variant) with suspected superimposed moderate left P1 PCA stenosis. Right  PCA is patent without significant stenosis. Venous sinuses: As permitted by contrast timing, patent. Anatomic variants: See above. Review of the MIP images confirms the above findings IMPRESSION: CT head: 1. Left temporoparietal acute infarct, as seen on same day MRI. No progressive mass effect  or acute hemorrhage. 2. Temporal lobe predominant atrophy, which is nonspecific but can be seen with Alzheimer's disease. CTA Head 1. No large vessel occlusion. 2. Left posterior communicating artery with small left P1 PCA (anatomic variant) with suspected superimposed moderate left P1 PCA stenosis. 3. Moderate distal left intradural vertebral artery stenosis. CTA Neck: 1. Streak artifact limits evaluation with potentially severe stenosis of the left vertebral artery at its dural margin and moderate stenosis of the right vertebral artery at C1-C2. 2. Moderate left and mild right carotid bifurcation atherosclerosis without greater than 50% stenosis. Electronically Signed   By: Margaretha Sheffield M.D.   On: 09/25/2020 12:29   DG Abd 1 View  Result Date: 09/25/2020 CLINICAL DATA:  Constipation. EXAM: ABDOMEN - 1 VIEW COMPARISON:  None. FINDINGS: No abnormal bowel dilatation is noted. Mild amount of stool seen throughout the colon. Status post cholecystectomy. IMPRESSION: Mild stool burden.  Abnormal bowel dilatation. Electronically Signed   By: Marijo Conception M.D.   On: 09/25/2020 14:53   CT HEAD WO CONTRAST (5MM)  Result Date: 09/24/2020 CLINICAL DATA:  Altered mental status EXAM: CT HEAD WITHOUT CONTRAST TECHNIQUE: Contiguous axial images were obtained from the base of the skull through the vertex without intravenous contrast. COMPARISON:  07/13/2020 FINDINGS: Brain: Normal anatomic configuration of the brain. There is effacement of the normal gray-white matter differentiation in keeping with an acute to subacute left temporoparietal cortical infarct. Minimal mass effect with effacement of the overlying sulci. No superimposed acute intracranial hemorrhage. There is interval development of a focal region of low attenuation within the right cerebellar hemisphere without significant associated mass effect or acute hemorrhage suspicious for an acute to subacute infarct. Evaluation, however, is slightly limited by  motion artifact and streak artifact. Mild parenchymal volume loss is commensurate with the patient's age. Asymmetrically more severe atrophy involving the anterior temporal lobes is again noted, unchanged. No abnormal intra or extra-axial mass lesion or fluid collection. No midline shift. Ventricular size is normal. Vascular: No asymmetric hyperdense vasculature at the skull base. Skull: Intact Sinuses/Orbits: The orbits are unremarkable. Paranasal sinuses are clear save for a mucous retention cyst partially visualized within the left maxillary sinus. Other: Mastoid air cells and middle ear cavities are clear. IMPRESSION: Acute to subacute cortical infarct within the a left temporoparietal cortex in keeping with a left posterior MCA territory infarct. Possible acute to subacute right cerebellar hemispheric infarct. No superimposed acute hemorrhage. No significant associated mass effect. MRI examination may be helpful for confirmation. These results were called by telephone at the time of interpretation on 09/24/2020 at 11:35 pm to provider [facility] , who verbally acknowledged these results. Electronically Signed   By: Fidela Salisbury M.D.   On: 09/24/2020 23:39   MR BRAIN WO CONTRAST  Result Date: 09/25/2020 CLINICAL DATA:  Acute neuro deficit. EXAM: MRI HEAD WITHOUT CONTRAST TECHNIQUE: Multiplanar, multiecho pulse sequences of the brain and surrounding structures were obtained without intravenous contrast. COMPARISON:  CT head 09/24/2020 FINDINGS: Brain: Acute infarct in the left temporoparietal lobe involving cortex, as noted on CT. No associated hemorrhage. No other acute infarct Generalized atrophy. Advanced atrophy in the anterior temporal lobes bilaterally. Negative for hydrocephalus. Mild white matter hyperintensities bilaterally. Negative for  mass lesion. Vascular: Normal arterial flow voids. Skull and upper cervical spine: No focal skeletal abnormality. Sinuses/Orbits: Retention cyst left  maxillary sinus. Remaining sinuses clear. Bilateral cataract extraction Other: Motion degraded study IMPRESSION: Acute infarct in the left temporoparietal cortex. No other acute infarct. Advanced atrophy in the temporal lobes bilaterally. Electronically Signed   By: Franchot Gallo M.D.   On: 09/25/2020 09:53   US Carotid Bilateral (at [facility] and [facility] only)  Result Date: 09/25/2020 CLINICAL DATA:  CVA.  History of hypertension. EXAM: BILATERAL CAROTID DUPLEX ULTRASOUND TECHNIQUE: Pearline Cables scale imaging, color Doppler and duplex ultrasound were performed of bilateral carotid and vertebral arteries in the neck. COMPARISON:  07/13/2020 FINDINGS: Criteria: Quantification of carotid stenosis is based on velocity parameters that correlate the residual internal carotid diameter with NASCET-based stenosis levels, using the diameter of the distal internal carotid lumen as the denominator for stenosis measurement. The following velocity measurements were obtained: RIGHT ICA: 103/15 cm/sec CCA: [id number] cm/sec SYSTOLIC ICA/CCA RATIO:  0.9 ECA: 149 cm/sec LEFT Patient refused complete interrogation of the left carotid system as well as the left vertebral artery. RIGHT CAROTID ARTERY: There is a minimal amount of eccentric echogenic plaque within the right carotid bulb (image 17), extending to involve the origin and proximal aspects the right internal carotid artery (image 24), not resulting in elevated peak systolic velocities within the interrogated course the right internal carotid artery to suggest a hemodynamically significant stenosis. Borderline elevated peak systolic velocity in the distal aspect the right internal carotid artery is felt to be factitiously elevated due to sampling at a location of turbulent flow. RIGHT VERTEBRAL ARTERY:  Antegrade Flow LEFT CAROTID ARTERY: There is a moderate to large amount of eccentric echogenic plaque within the left carotid bulb (image 41). Patient refused additional interrogation of the  left carotid system. LEFT VERTEBRAL ARTERY: Patient refused evaluation of the left vertebral artery IMPRESSION: 1. Incomplete examination secondary to patient's abrupt termination of the examination prior to complete evaluation of the left carotid system and left vertebral artery. 2. Limited evaluation of the left carotid system demonstrates a moderate to large amount of atherosclerotic plaque. Note, a 50-69% luminal narrowing was demonstrated within the left internal carotid artery on recent carotid Doppler ultrasound performed 07/13/2020. As such, further evaluation with CTA could be performed as indicated. 3. Minimal amount of right-sided atherosclerotic plaque, not resulting in a hemodynamically significant stenosis. Electronically Signed   By: Sandi Mariscal M.D.   On: 09/25/2020 09:44        Scheduled Meds:   stroke: mapping our early stages of recovery book   Does not apply Once   aspirin  300 mg Rectal Daily   atorvastatin  80 mg Oral Daily   citalopram  10 mg Oral QODAY   clopidogrel  75 mg Oral Daily   donepezil  10 mg Oral QHS   enoxaparin (LOVENOX) injection  40 mg Subcutaneous Q24H   felodipine  5 mg Oral Daily   gabapentin  200 mg Oral QHS   hydrALAZINE  50 mg Oral BID   insulin aspart  0-9 Units Subcutaneous TID AC & HS   irbesartan  37.5 mg Oral Daily   iron polysaccharides  150 mg Oral Daily   lactase  1 tablet Oral Daily   methocarbamol  500 mg Oral QHS   multivitamin with minerals  1 tablet Oral BID   pantoprazole  40 mg Oral Daily   polycarbophil  625 mg Oral Daily   polyethylene glycol  17 g Oral Daily   senna-docusate  1 tablet Oral BID   Continuous Infusions:   LOS: 1 day    Time spent: 35 minutes    Sidney Ace, MD [facility] Pager [phone]  If 7PM-7AM, please contact night-coverage 09/26/2020, 10:53 AM

## 2020-09-26 NOTE — Evaluation (Signed)
Occupational Therapy Evaluation Patient Details Name: Courtney Lynn MRN: [id number] DOB: [birth year] Today's Date: 09/26/2020    History of Present Illness Pt is an 80 y/o F with PMH: BRCA, HTN, bradycardia, dementia, and pulmonary HTN. Adm for fall in June 2022. Pt BIB EMS from [facility]F d/t AMS. 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   Clinical Impression   Pt seen for OT evaluation this date in setting of acute hospitalization with CVA. Pt's baseline primarily obtained from previous therapy evaluation and chart review. At baseline, pt was apparently MOD I with rollator for fxl mobility and was able to perform her basic self care. Pt presents this date with confusion, expressive and receptive difficulties and some general deconditioning impacting her ability to safely perform ADLs. On OT Assessment this date, pt requires: CGA/SBA with ADL transfers and fxl mobility with RW. SETUP to MIN A for seated UB ADLs as well as visual/tactile cues to initiate and sequence (ex: self-feeding). In addition, chart indicates that pt is R handed, but she is currently eating with L hand. Difficult to formally assess for decreased strength/coordination r/t the CVA d/t pt's cognitive level and receptive difficulties. Pt requires MOD A for some standing LB ADLs such as posterior LB bathing-appears primarily r/t cognition versus physical capability, but again, pt using L hand to complete task. At this time, anticipate pt will require STR f/u OT services for safety.     Follow Up Recommendations  SNF    Equipment Recommendations  Other (comment) (defer to next level of care)    Recommendations for Other Services       Precautions / Restrictions Precautions Precautions: Fall Restrictions Weight Bearing Restrictions: No      Mobility Bed Mobility Overal bed mobility: Needs Assistance Bed Mobility: Supine to Sit     Supine to sit: Supervision     General  bed mobility comments: pt attempting to get OOB with alarm going off when OT enters room for session. Does require safety cues to come to EOB sitting and SBA.    Transfers Overall transfer level: Needs assistance Equipment used: Rolling walker (2 wheeled) Transfers: Sit to/from [facility] Sit to Stand: Supervision;Min guard Stand pivot transfers: Supervision;Min guard       General transfer comment: visual/tactile cues for initiation, sequence, safety (not as receptive to verbal cues)    Balance Overall balance assessment: Needs assistance   Sitting balance-Leahy Scale: Good     Standing balance support: Bilateral upper extremity supported;During functional activity Standing balance-Leahy Scale: Fair Standing balance comment: G static standing, F balance with fxl mobility, benefits from UE support, but she is able to alternate hands from walker.                           ADL either performed or assessed with clinical judgement   ADL Overall ADL's : Needs assistance/impaired                                       General ADL Comments: Requires CGA/SBA with ADL transfers and fxl mobility with RW. SETUP to MIN A for seated UB ADLs as well as visual/tactile cues to initiate and sequence (ex: self-feeding). Chart indicates pt R handed, but currently feeding self with L hand on OT assessment. Pt requires MOD A for standing LB ADLs  such as peri care, seemingly mostly d/t cognition rather than physical limitations, but she is noted to attempt to wipe with L hand when chart indicates she is R handed which could also be limiting task performance.     Vision   Additional Comments: difficult to formally assess d/t cognition. Pt able to track for most of session, but noted to walk into things on RIGHT side during fxl mobility requring therapist correction. Whether this is r/t cogntiion, vision, or one-sided weakness is difficult to ascertain.      Perception     Praxis      Pertinent Vitals/Pain Pain Assessment: Faces Faces Pain Scale: No hurt     Hand Dominance Right   Extremity/Trunk Assessment Upper Extremity Assessment Upper Extremity Assessment: Overall WFL for tasks assessed;Difficult to assess due to impaired cognition;RUE deficits/detail (with with generally good functional strength for self care tasks performed. Unable to formally assess d/t difficulty following higher level commands.) RUE Deficits / Details: chart indicates that pt is right handed (previous therapy evaluation), but pt noted to perform [facility] self care tasks such as using utensil to eat, with L hand at this time.   Lower Extremity Assessment Lower Extremity Assessment: Overall WFL for tasks assessed;Difficult to assess due to impaired cognition       Communication Communication Communication: Receptive difficulties;Expressive difficulties;HOH   Cognition Arousal/Alertness: Awake/alert Behavior During Therapy: Restless;Anxious Overall Cognitive Status: No family/caregiver present to determine baseline cognitive functioning                                 General Comments: Pt with h/o dementia at baseline. Now presenting with expressive and receptive speech difficulties. Pt requires visual and tactile cues to follow simple one step commands and requires increased time to process. Pt follows ~50% simple one step cues. Does say some phrases that align with the current task, but otherwise, just appears to say rote phrases, not pertaining to situation such as stating "which one" several times, when OT introduces herself and role to pt.   General Comments       Exercises Other Exercises Other Exercises: OT engages pt in toileting, bathing and dressing tasks. Pt up to chair with belt headstart alarm at end of session.   Shoulder Instructions      Home Living Family/patient expects to be discharged to:: Assisted living                              Home Equipment: Walker - 4 wheels;Shower seat - built in;Cane - single point   Additional Comments: [facility] [employer]- pt lives on 2nd floor of ALF, but is able to utilize Media planner      Prior Functioning/Environment Level of Independence: Needs assistance  Gait / Transfers Assistance Needed: Pt is mod I with use of rollator ADL's / Homemaking Assistance Needed: Information obtained from chart review as pt with expressive and receptive difficulties. She was in ALF which assisted with HH IADLs including meals and cleaning. Pt reported previuosly managing meds herself (2MA) unclear if this is still the case. Pt no longer driving. Able to perform basic ADLs herself.   Comments: 2 falls in last year per chart.        OT Problem List: Decreased strength;Decreased activity tolerance;Impaired balance (sitting and/or standing);Decreased cognition;Decreased safety awareness;Decreased knowledge of use of DME or AE;Decreased coordination  OT Treatment/Interventions: Self-care/ADL training;Therapeutic exercise;Neuromuscular education;DME and/or AE instruction;Therapeutic activities;Patient/family education;Balance training    OT Goals(Current goals can be found in the care plan section) Acute Rehab OT Goals OT Goal Formulation: Patient unable to participate in goal setting Time For Goal Achievement: 10/10/20 Potential to Achieve Goals: Fair ADL Goals Pt Will Perform Lower Body Dressing: with supervision;sit to/from stand (with LRAD/AE PRN) Pt Will Transfer to Toilet: with supervision;ambulating (wuth LRAD) Pt/caregiver will Perform Home Exercise Program: Increased strength;Both right and left upper extremity;With minimal assist Additional ADL Goal #1: Pt will follow ~70% of simple one step commands with visual/tactile cues.  OT Frequency: Min 2X/week   Barriers to D/C:            Co-evaluation              AM-PAC OT "6 Clicks" Daily Activity      Outcome Measure Help from another person eating meals?: A Little Help from another person taking care of personal grooming?: A Little Help from another person toileting, which includes using toliet, bedpan, or urinal?: A Little Help from another person bathing (including washing, rinsing, drying)?: A Lot Help from another person to put on and taking off regular upper body clothing?: A Little Help from another person to put on and taking off regular lower body clothing?: A Lot 6 Click Score: 16   End of Session Equipment Utilized During Treatment: Gait belt;Rolling walker Nurse Communication: Mobility status;Other (comment) (anxiousness, notified that OT found pt's PIV pulled out and in bed next to her (appears to have come from L hand))  Activity Tolerance: Patient tolerated treatment well Patient left: in chair;with call bell/phone within reach;with chair alarm set (lap headstart alarm for chair utilized d/t impulsivity, Pt physically able to un-do straps)  OT Visit Diagnosis: Unsteadiness on feet (R26.81);Muscle weakness (generalized) (M62.81);Other symptoms and signs involving the nervous system (R29.898);Other symptoms and signs involving cognitive function                Time: 4986-5168 OT Time Calculation (min): 32 min Charges:  OT General Charges $OT Visit: 1 Visit OT Evaluation $OT Eval Moderate Complexity: 1 Mod OT Treatments $Self Care/Home Management : 8-22 mins  Gerrianne Scale, MS, OTR/L ascom [phone] 09/26/20, 11:46 AM

## 2020-09-26 NOTE — Progress Notes (Signed)
Echo report  Report will not transfer appropriately to Epic. Formal result to follow.    Left ventricular ejection fraction, by estimation, is 60 to 65%. The left ventricle has normal function. The left ventricle has no regional wall motion abnormalities. Left ventricular diastolic parameters were normal.Right ventricular systolic function was not well visualized. The right ventricular size is not well visualized.The mitral valve is normal in structure. Trivial mitral valve regurgitation. No evidence of mitral stenosis.The aortic valve is normal in structure. Aortic valve regurgitation is not visualized. No aortic stenosis is present.   Andrez Grime, MD

## 2020-09-26 NOTE — Progress Notes (Signed)
ST visited at the end of meal today. Observed her eating one bite and sip with no s/s of aspiration. She had eaten about 75% of meal. Rec continue with Dys 2 diet. Rec Speech Therapy at discharge for full speech and language eval for Aphasia.

## 2020-09-26 NOTE — TOC Initial Note (Signed)
Transition of Care [facility]) - Initial/Assessment Note    Patient Details  Name: Courtney Lynn MRN: [id number] Date of Birth: July 16, 1940  [facility] CM/SW Contact:    Shelbie Hutching, RN Phone Number: 09/26/2020, 3:33 PM  Clinical Narrative:                 Patient admitted to the hospital with acute encephalopathy and imaging showed acute stroke.  Patient is alert but not oriented, she has been sitting up in the chair today and has been agitated at times.  RNCM called and spoke with patient's brother, [name], who lives in [place].   Patient lives in [facility], she walks with a walker, she also has a cane.  PT is recommending SNF for short term rehab.  Brother is in agreement.  Seth Bake at [facility] reports they will have a bed for her on Monday if ready for discharge by that time.    Expected Discharge Plan: Skilled Nursing Facility Barriers to Discharge: Continued Medical Work up   Patient Goals and CMS Choice Patient states their goals for this hospitalization and ongoing recovery are:: patient unable to state goals at this time CMS Medicare.gov Compare Post Acute Care list provided to:: Patient Choice offered to / list presented to : Patient, Sibling  Expected Discharge Plan and Services Expected Discharge Plan: [place]   Discharge Planning Services: CM Consult Post Acute Care Choice: [place] Living arrangements for the past 2 months: [place]                 DME Arranged: N/A DME Agency: NA       HH Arranged: NA HH Agency: NA        Prior Living Arrangements/Services Living arrangements for the past 2 months: [place] Lives with:: Self Patient language and need for interpreter reviewed:: Yes Do you feel safe going back to the place where you live?: Yes      Need for Family Participation in Patient Care: Yes (Comment) Care giver support system in place?: Yes (comment) (brother in  [place])   Criminal Activity/Legal Involvement Pertinent to Current Situation/Hospitalization: No - Comment as needed  Activities of Daily Living Home Assistive Devices/Equipment: None ADL Screening (condition at time of admission) Patient's cognitive ability adequate to safely complete daily activities?: No Is the patient deaf or have difficulty hearing?: No Does the patient have difficulty seeing, even when wearing glasses/contacts?: No Does the patient have difficulty concentrating, remembering, or making decisions?: Yes Patient able to express need for assistance with ADLs?: No Does the patient have difficulty dressing or bathing?: Yes Independently performs ADLs?: No Communication: Needs assistance Is this a change from baseline?: Change from baseline, expected to last <3 days Dressing (OT): Needs assistance Is this a change from baseline?: Change from baseline, expected to last <3days Grooming: Needs assistance Is this a change from baseline?: Change from baseline, expected to last <3 days Feeding: Needs assistance Is this a change from baseline?: Change from baseline, expected to last <3 days Bathing: Needs assistance Is this a change from baseline?: Change from baseline, expected to last <3 days Toileting: Needs assistance Is this a change from baseline?: Change from baseline, expected to last <3 days In/Out Bed: Needs assistance Is this a change from baseline?: Change from baseline, expected to last <3 days Walks in Home: Needs assistance Is this a change from baseline?: Change from baseline, expected to last <3 days Does the  patient have difficulty walking or climbing stairs?: Yes Weakness of Legs: Both Weakness of Arms/Hands: None  Permission Sought/Granted Permission sought to share information with : Case Manager, Customer service manager, Family Supports Permission granted to share information with : Yes, Verbal Permission Granted  Share Information with  NAME: Gloris Ham  Permission granted to share info w AGENCY: [place] granted to share info w Relationship: brother  Permission granted to share info w Contact Information: [phone]  Emotional Assessment Appearance:: Appears stated age     Orientation: : Fluctuating Orientation (Suspected and/or reported Sundowners) Alcohol / Substance Use: Not Applicable Psych Involvement: No (comment)  Admission diagnosis:  CVA (cerebral vascular accident) ([place]) [I63.9] Acute encephalopathy [G93.40] Constipation [K59.00] Cerebrovascular accident (CVA), unspecified mechanism ([place]) [I63.9] Patient Active Problem List   Diagnosis Date Noted   Acute encephalopathy 09/25/2020   Fall 07/14/2020   Intracranial bleed ([place]) 07/13/2020   Dementia ([place]) 07/13/2020   Closed dislocation of right thumb 07/13/2020   AKI (acute kidney injury) ([place]) 07/13/2020   Syncope and collapse 07/13/2020   Major depressive disorder, recurrent, in remission ([place]) 10/17/2018   Loss of memory 03/14/2018   Poorly-controlled hypertension 01/05/2018   Hemorrhoids 12/15/2017   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   Postmenopausal osteoporosis 11/23/2017   Lumbar radiculopathy 07/14/2017   Osteopenia of the elderly 05/24/2017   Dizziness 12/13/2015   Intermittent vertigo 12/13/2015   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 ([place]) 12/07/2015   Fracture of left foot 10/26/2015   OSA on CPAP 04/14/2015   Nonrheumatic mitral valve insufficiency 09/10/2014   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[place]) 09/10/2014   Nonrheumatic mitral valve prolapse 09/10/2014   Premature ventricular contractions 09/10/2014   Other secondary pulmonary hypertension ([place]) 09/10/2014   Ventricular premature depolarization 09/10/2014   Bradycardia 07/01/2014   Hypertension 07/01/2014   Hyponatremia 07/01/2014   Hyposmolality and/or hyponatremia 07/01/2014   Chronic anemia 04/23/2014   Anemia 04/23/2014   Body mass index (BMI)  of 37.0-37.9 in adult 03/28/2014   Chronic insomnia 03/28/2014   Depressive disorder 03/28/2014   Essential hypertension 03/28/2014   Gastroesophageal reflux disease without esophagitis 03/28/2014   Pre-diabetes 03/28/2014   Primary osteoarthritis of both knees 03/28/2014   Insomnia 03/28/2014   Depression, major, single episode, complete remission ([place]) 03/28/2014   Other obesity 03/28/2014   Acute bronchitis, viral 01/30/2014   PCP:  Tracie Harrier, MD Pharmacy:   [facility] [id number] [address], [place] AT [address] [place] [address] Phone: [phone] Fax: [place], [place] [address] [place] [address] [place] [address] Phone: [phone] Fax: [phone]     Social Determinants of Health (SDOH) Interventions    Readmission Risk Interventions No flowsheet data found.

## 2020-09-26 NOTE — Progress Notes (Signed)
*  PRELIMINARY RESULTS* Echocardiogram 2D Echocardiogram has been performed.  Courtney Lynn Courtney Lynn 09/26/2020, 11:26 AM

## 2020-09-26 NOTE — NC FL2 (Signed)
[place] LEVEL OF CARE SCREENING TOOL     IDENTIFICATION  Patient Name: Courtney Lynn Birthdate: 22-Jul-1940 Sex: female Admission Date (Current Location): 09/24/2020  [place] and [place] Number:  Engineering geologist and Address:  [facility], [address] [place], [place] [address]      Provider Number: [id number]  Attending Physician Name and Address:  Sidney Ace, MD  Relative Name and Phone Number:  Shane Crutch (brother) [phone]    Current Level of Care: Hospital Recommended Level of Care: [place] Prior Approval Number:    Date Approved/Denied:   PASRR Number: Pending  Discharge Plan: SNF    Current Diagnoses: Patient Active Problem List   Diagnosis Date Noted   Acute encephalopathy 09/25/2020   Fall 07/14/2020   Intracranial bleed ([place]) 07/13/2020   Dementia ([place]) 07/13/2020   Closed dislocation of right thumb 07/13/2020   AKI (acute kidney injury) ([place]) 07/13/2020   Syncope and collapse 07/13/2020   Major depressive disorder, recurrent, in remission ([place]) 10/17/2018   Loss of memory 03/14/2018   Poorly-controlled hypertension 01/05/2018   Hemorrhoids 12/15/2017   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   Postmenopausal osteoporosis 11/23/2017   Lumbar radiculopathy 07/14/2017   Osteopenia of the elderly 05/24/2017   Dizziness 12/13/2015   Intermittent vertigo 12/13/2015   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 ([place]) 12/07/2015   Fracture of left foot 10/26/2015   OSA on CPAP 04/14/2015   Nonrheumatic mitral valve insufficiency 09/10/2014   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[place]) 09/10/2014   Nonrheumatic mitral valve prolapse 09/10/2014   Premature ventricular contractions 09/10/2014   Other secondary pulmonary hypertension ([place]) 09/10/2014   Ventricular premature depolarization 09/10/2014   Bradycardia 07/01/2014   Hypertension 07/01/2014   Hyponatremia 07/01/2014    Hyposmolality and/or hyponatremia 07/01/2014   Chronic anemia 04/23/2014   Anemia 04/23/2014   Body mass index (BMI) of 37.0-37.9 in adult 03/28/2014   Chronic insomnia 03/28/2014   Depressive disorder 03/28/2014   Essential hypertension 03/28/2014   Gastroesophageal reflux disease without esophagitis 03/28/2014   Pre-diabetes 03/28/2014   Primary osteoarthritis of both knees 03/28/2014   Insomnia 03/28/2014   Depression, major, single episode, complete remission ([place]) 03/28/2014   Other obesity 03/28/2014   Acute bronchitis, viral 01/30/2014    Orientation RESPIRATION BLADDER Height & Weight        Normal Incontinent Weight: 84 kg Height:  '5\' 1"'$  (154.9 cm)  BEHAVIORAL SYMPTOMS/MOOD NEUROLOGICAL BOWEL NUTRITION STATUS      Continent Diet (see discharge summary)  AMBULATORY STATUS COMMUNICATION OF NEEDS Skin   Limited Assist Verbally Normal                       Personal Care Assistance Level of Assistance  Bathing, Feeding, Dressing Bathing Assistance: Limited assistance Feeding assistance: Limited assistance Dressing Assistance: Limited assistance     Functional Limitations Info  Sight, Hearing, Speech Sight Info: Adequate Hearing Info: Adequate Speech Info: Adequate    SPECIAL CARE FACTORS FREQUENCY  PT (By licensed PT), OT (By licensed OT)     PT Frequency: 5 days per week OT Frequency: 5 days per week            Contractures Contractures Info: Not present    Additional Factors Info  Code Status, Allergies Code Status Info: Full Allergies Info: Cleocin, PenicillinV           Current Medications (09/26/2020):  This is the current hospital active  medication list Current Facility-Administered Medications  Medication Dose Route Frequency Provider Last Rate Last Admin    stroke: mapping our early stages of recovery book   Does not apply Once Mansy, Jan A, MD       acetaminophen (TYLENOL) tablet 650 mg  650 mg Oral Q6H PRN Mansy, Jan A, MD       Or    acetaminophen (TYLENOL) suppository 650 mg  650 mg Rectal Q6H PRN Mansy, Jan A, MD       aspirin suppository 300 mg  300 mg Rectal Daily Priscella Mann, Sudheer B, MD   300 mg at 09/25/20 1213   atorvastatin (LIPITOR) tablet 80 mg  80 mg Oral Daily Ralene Muskrat B, MD   80 mg at 09/26/20 0943   bisacodyl (DULCOLAX) suppository 10 mg  10 mg Rectal Daily PRN Ralene Muskrat B, MD       citalopram (CELEXA) tablet 10 mg  10 mg Oral QODAY Mansy, Jan A, MD       clopidogrel (PLAVIX) tablet 75 mg  75 mg Oral Daily Amie Portland, MD   75 mg at 09/26/20 0948   donepezil (ARICEPT) tablet 10 mg  10 mg Oral QHS Mansy, Jan A, MD   10 mg at 09/25/20 2225   enoxaparin (LOVENOX) injection 40 mg  40 mg Subcutaneous Q24H Mansy, Jan A, MD   40 mg at 09/26/20 0943   felodipine (PLENDIL) 24 hr tablet 5 mg  5 mg Oral Daily Mansy, Jan A, MD   5 mg at 09/26/20 [id number]   gabapentin (NEURONTIN) capsule 200 mg  200 mg Oral QHS Mansy, Jan A, MD   200 mg at 09/25/20 2225   haloperidol lactate (HALDOL) injection 2 mg  2 mg Intramuscular Q6H PRN Mansy, Jan A, MD   2 mg at 09/26/20 0437   hydrALAZINE (APRESOLINE) injection 10 mg  10 mg Intravenous Q4H PRN Ralene Muskrat B, MD       hydrALAZINE (APRESOLINE) tablet 50 mg  50 mg Oral BID Priscella Mann, Sudheer B, MD   50 mg at 09/26/20 0943   insulin aspart (novoLOG) injection 0-9 Units  0-9 Units Subcutaneous TID AC & HS Mansy, Jan A, MD       irbesartan (AVAPRO) tablet 37.5 mg  37.5 mg Oral Daily Mansy, Jan A, MD   37.5 mg at 09/26/20 [id number]   iron polysaccharides (NIFEREX) capsule 150 mg  150 mg Oral Daily Mansy, Jan A, MD   150 mg at 09/26/20 [id number]   lactase (LACTAID) tablet 3,000 Units  1 tablet Oral Daily Mansy, Jan A, MD   3,000 Units at 09/26/20 [id number]   magnesium hydroxide (MILK OF MAGNESIA) suspension 30 mL  30 mL Oral Daily PRN Mansy, Jan A, MD       methocarbamol (ROBAXIN) tablet 500 mg  500 mg Oral QHS Mansy, Jan A, MD       multivitamin with minerals tablet 1 tablet  1  tablet Oral BID Mansy, Jan A, MD   1 tablet at 09/26/20 0943   ondansetron (ZOFRAN) tablet 4 mg  4 mg Oral Q6H PRN Mansy, Jan A, MD       Or   ondansetron [facility]) injection 4 mg  4 mg Intravenous Q6H PRN Mansy, Jan A, MD   4 mg at 09/25/20 0209   pantoprazole (PROTONIX) EC tablet 40 mg  40 mg Oral Daily Mansy, Jan A, MD   40 mg at 09/26/20 0943   polycarbophil (FIBERCON) tablet 625 mg  625 mg Oral Daily Mansy, Jan A, MD   625 mg at 09/26/20 0942   polyethylene glycol (MIRALAX / GLYCOLAX) packet 17 g  17 g Oral Daily Ralene Muskrat B, MD   17 g at 09/26/20 [id number]   senna-docusate (Senokot-S) tablet 1 tablet  1 tablet Oral BID Ralene Muskrat B, MD   1 tablet at 09/26/20 0943   traZODone (DESYREL) tablet 25 mg  25 mg Oral QHS PRN Mansy, Jan A, MD       ziprasidone (GEODON) injection 10 mg  10 mg Intramuscular Q4H PRN Ralene Muskrat B, MD   10 mg at 09/26/20 [id number]     Discharge Medications: Please see discharge summary for a list of discharge medications.  Relevant Imaging Results:  Relevant Lab Results:   Additional Information SS# [id number]  Shelbie Hutching, RN

## 2020-09-27 LAB — GLUCOSE, CAPILLARY
Glucose-Capillary: 101 mg/dL — ABNORMAL HIGH (ref 70–99)
Glucose-Capillary: 88 mg/dL (ref 70–99)
Glucose-Capillary: 108 mg/dL — ABNORMAL HIGH (ref 70–99)
Glucose-Capillary: 98 mg/dL (ref 70–99)

## 2020-09-27 MED ORDER — IRBESARTAN 150 MG PO TABS
75.0000 mg | ORAL_TABLET | Freq: Every day | ORAL | Status: DC
  Administered 2020-09-27 – 2020-09-30 (×4): 75 mg via ORAL
  Filled 2020-09-27 (×3): qty 1

## 2020-09-27 MED ORDER — HYDRALAZINE HCL 50 MG PO TABS
50.0000 mg | ORAL_TABLET | Freq: Three times a day (TID) | ORAL | Status: DC
  Administered 2020-09-27 – 2020-09-30 (×10): 50 mg via ORAL
  Filled 2020-09-27 (×10): qty 1

## 2020-09-27 NOTE — Progress Notes (Signed)
Occupational Therapy Treatment Patient Details Name: Courtney Lynn MRN: [id number] DOB: 05/15/1940 Today's Date: 09/27/2020    History of present illness Pt is an 80 y/o F with PMH: BRCA, HTN, bradycardia, dementia, and pulmonary HTN. Adm for fall in June 2022. Pt BIB EMS from [facility] d/t AMS. 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   OT comments  Pt seen for OT tx this date to f/u re: safety with ADLs/ADL mobility. Pt more calm upon OT entering room this date, but she is noted to be more apprehensive about getting OOB. With gentle encouragement and visual/tactile cues, pt agreeable to fxl mobility and getting into the chair. OT engages pt in seated LB dressing to don socks with CGA/MIN A and curing to initiate/sequence. Pt requires MIN tactile/visual cues for safe use of RW to CTA with CGA/MIN A and performs short bout of fxl mobility in room (~15-20') with CGA. Pt transferred to chair with CGA/MIN A and headstart chair alarm utilized for safety as pt is somewhat impulsive (demos ability to remove). Pt left with all needs met and in reach. Will continue to follow.   Follow Up Recommendations  SNF    Equipment Recommendations  Other (comment) (defer)    Recommendations for Other Services      Precautions / Restrictions Precautions Precautions: Fall Restrictions Weight Bearing Restrictions: No       Mobility Bed Mobility Overal bed mobility: Needs Assistance Bed Mobility: Supine to Sit     Supine to sit: Min guard;Supervision;HOB elevated          Transfers Overall transfer level: Needs assistance Equipment used: Rolling walker (2 wheeled) Transfers: Sit to/from Stand Sit to Stand: Min guard;Min assist         General transfer comment: visual/tactile cues for safety.    Balance Overall balance assessment: Needs assistance Sitting-balance support: No upper extremity supported;Feet supported Sitting balance-Leahy Scale:  Good     Standing balance support: Bilateral upper extremity supported Standing balance-Leahy Scale: Fair Standing balance comment: G static standing, F balance with fxl mobility, benefits from UE support, but she is able to alternate hands from walker.                           ADL either performed or assessed with clinical judgement   ADL Overall ADL's : Needs assistance/impaired                     Lower Body Dressing: Min guard;Minimal assistance;Sitting/lateral leans Lower Body Dressing Details (indicate cue type and reason): to don socks. Pt also requires MOD visual/tactile cues to intiaite/sequence task d/t receptive difficulties.                     Vision Patient Visual Report: No change from baseline     Perception     Praxis      Cognition Arousal/Alertness: Awake/alert Behavior During Therapy: WFL for tasks assessed/performed;Flat affect Overall Cognitive Status: No family/caregiver present to determine baseline cognitive functioning                                 General Comments: Pt with h/o dementia at baseline. Now presenting with expressive and receptive speech difficulties. Pt requires visual and tactile cues to follow simple one step commands and requires increased time to process. Pt follows ~  50% simple one step cues. Does say some phrases that align with the current task, but otherwise, just appears to say rote phrases with spontaneous/automatic speech patterns        Exercises Other Exercises Other Exercises: OT engages pt in seated LB dsg with MINA/CGA for safety with tactile/visual cues to initiate/sequence.   Shoulder Instructions       General Comments      Pertinent Vitals/ Pain       Pain Assessment: Faces Faces Pain Scale: No hurt  Home Living                                          Prior Functioning/Environment              Frequency  Min 2X/week        Progress  Toward Goals  OT Goals(current goals can now be found in the care plan section)  Progress towards OT goals: Progressing toward goals  Acute Rehab OT Goals OT Goal Formulation: Patient unable to participate in goal setting Time For Goal Achievement: 10/10/20 Potential to Achieve Goals: Fair  Plan      Co-evaluation                 AM-PAC OT "6 Clicks" Daily Activity     Outcome Measure   Help from another person eating meals?: A Little Help from another person taking care of personal grooming?: A Little Help from another person toileting, which includes using toliet, bedpan, or urinal?: A Little Help from another person bathing (including washing, rinsing, drying)?: A Lot Help from another person to put on and taking off regular upper body clothing?: A Little Help from another person to put on and taking off regular lower body clothing?: A Lot 6 Click Score: 16    End of Session Equipment Utilized During Treatment: Gait belt;Rolling walker  OT Visit Diagnosis: Unsteadiness on feet (R26.81);Muscle weakness (generalized) (M62.81);Other symptoms and signs involving the nervous system (R29.898);Other symptoms and signs involving cognitive function   Activity Tolerance Patient tolerated treatment well   Patient Left in chair;with call bell/phone within reach;with chair alarm set   Nurse Communication Mobility status        Time: 1100-1132 OT Time Calculation (min): 32 min  Charges: OT General Charges $OT Visit: 1 Visit OT Treatments $Self Care/Home Management : 8-22 mins $Therapeutic Activity: 8-22 mins   , MS, OTR/L ascom [phone] 09/27/20, 1:16 PM  

## 2020-09-27 NOTE — Progress Notes (Signed)
To whom it may concern:  Please be advised that this patient has a primary diagnosis of dementia which supercedes any psychiatric diagnosis.

## 2020-09-27 NOTE — Progress Notes (Signed)
z PROGRESS NOTE    Courtney Lynn  [id number] DOB: 07-03-1940 DOA: 09/24/2020 PCP: Tracie Harrier, MD   Brief Narrative:   80 year old female who lives full-time assisted living with history of dementia, breast cancer, depression, dysrhythmia, hypertension, sleep apnea who presented for evaluation of increasing altered mentation.  Unknown last time well.  Significant cognitive decline noted by family members.  On admission MRI and CT imaging survey reveals acute infarct in the left temporoparietal cortex.  Patient has been intermittently agitated and aphasic.  Discussed with neurology.  Neurologic deficits are consistent with mixed moderate aphasia which is expected from infarct in the left temporoparietal cortex.  No emergent large vessel occlusion on CT and MRI imaging survey.  Noted for generalized atrophy consistent with cognitive decline.  Also evidence of intracranial atherosclerosis.  No atrial fibrillation noted on monitor however patient is unable to tolerate telemetry frequently takes off her leads.  Patient's been medically stable though blood pressure has been difficult to control and patient has been intermittently confused and disoriented.  From neurology standpoint patient is stable.  Discharge planning in progress.  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 Unknown last time well, not a tPA candidate Echocardiogram reassuring Plan: Appreciate neurology input and follow-up High intensity statin Dual antiplatelet therapy aspirin and Plavix for 3 months, followed by Plavix  Therapy recommending skilled nursing facility Outpatient referral to cardiology for 30-day event monitor Neurology follow-up 4 to 6 weeks  Dementia/cognitive decline Patient lives full-time at assisted living Previously in  independent living but cognitive decline noted over the past several months Need skilled nursing facility at time of DC [facility] consulted Therapy evaluations complete, recommending SNF Continue home Aricept  Essential hypertension Patient out of window for permissive hypertension Normotensive blood pressure goals Increase hydralazine to 50 mg 3 times daily Increase irbesartan to 75 mg daily As needed IV hydralazine  GERD PTA PPI  Peripheral neuropathy PTA Neurontin  Depression PTA Celexa  Prediabetes A1c reassuring       DVT prophylaxis: SQ Lovenox Code Status: Full Family Communication: None today Disposition Plan: Status is: Inpatient  Remains inpatient appropriate because:Inpatient level of care appropriate due to severity of illness  Dispo: The patient is from: ALF              Anticipated d/c is to: SNF              Patient currently is not medically stable to d/c.   Difficult to place patient No  Blood pressure control.  Discharge planning.  Anticipated date of discharge Monday 8/29     Level of care: Med-Surg  Consultants:  Neurology  Procedures:  None  Antimicrobials:  None   Subjective: Seen and examined.  Sleeping on arrival but easily awakened.  Confused.  Objective: Vitals:   09/26/20 2237 09/26/20 2238 09/27/20 0428 09/27/20 0700  BP: (!) 193/74 (!) 193/74 (!) 188/79 (!) 193/90  Pulse: 81  82 78  Resp:   17   Temp:   98.1 F (36.7 C) (!) 97.5 F (36.4 C)  TempSrc:   Oral Oral  SpO2:   98% 100%  Weight:      Height:       No intake or output data in the 24 hours ending 09/27/20 1030 Filed Weights   09/24/20 2238  Weight: 84 kg  Examination:  General exam: No acute distress.  Confused Respiratory system: Clear to auscultation. Respiratory effort normal. Cardiovascular system: S1 & S2 heard, RRR. No JVD, murmurs, rubs, gallops or clicks. No pedal edema. Gastrointestinal system: Abdomen is nondistended, soft and nontender.  No organomegaly or masses felt. Normal bowel sounds heard. Central nervous system: Alert, oriented to person only, difficult to redirect Extremities: Decreased power symmetrically Skin: No rashes, lesions or ulcers Psychiatry: Judgement and insight appear impaired. Mood & affect agitated.     Data Reviewed: I have personally reviewed following labs and imaging studies  CBC: Recent Labs  Lab 09/24/20 2245 09/25/20 0634  WBC 6.5 9.9  NEUTROABS 3.9  --   HGB 11.5* 12.3  HCT 32.9* 35.2*  MCV 94.3 91.2  PLT 367 [id number]   Basic Metabolic Panel: Recent Labs  Lab 09/24/20 2245 09/25/20 0634  NA 134* 134*  K 4.9 4.2  CL 100 102  CO2 29 24  GLUCOSE 113* 111*  BUN 36* 30*  CREATININE 1.09* 0.93  CALCIUM 8.7* 9.2   GFR: Estimated Creatinine Clearance: 46.7 mL/min (by C-G formula based on SCr of 0.93 mg/dL). Liver Function Tests: Recent Labs  Lab 09/24/20 2245  AST 25  ALT 19  ALKPHOS 63  BILITOT 0.5  PROT 6.2*  ALBUMIN 3.7   No results for input(s): LIPASE, AMYLASE in the last 168 hours. No results for input(s): AMMONIA in the last 168 hours. Coagulation Profile: No results for input(s): INR, PROTIME in the last 168 hours. Cardiac Enzymes: No results for input(s): CKTOTAL, CKMB, CKMBINDEX, TROPONINI in the last 168 hours. BNP (last 3 results) No results for input(s): PROBNP in the last 8760 hours. HbA1C: Recent Labs    09/25/20 0634  HGBA1C 5.3   CBG: Recent Labs  Lab 09/25/20 2124 09/26/20 0928 09/26/20 1614 09/26/20 2211 09/27/20 0718  GLUCAP 109* 119* 129* 117* 101*   Lipid Profile: Recent Labs    09/25/20 0634  CHOL 234*  HDL 62  LDLCALC 162*  TRIG 50  CHOLHDL 3.8   Thyroid Function Tests: No results for input(s): TSH, T4TOTAL, FREET4, T3FREE, THYROIDAB in the last 72 hours. Anemia Panel: No results for input(s): VITAMINB12, FOLATE, FERRITIN, TIBC, IRON, RETICCTPCT in the last 72 hours. Sepsis Labs: No results for input(s): PROCALCITON,  LATICACIDVEN in the last 168 hours.  Recent Results (from the past 240 hour(s))  Resp Panel by RT-PCR (Flu A&B, Covid) Nasopharyngeal Swab     Status: None   Collection Time: 09/24/20 10:45 PM   Specimen: Nasopharyngeal Swab; Nasopharyngeal(NP) swabs in vial transport medium  Result Value Ref Range Status   SARS Coronavirus 2 by RT PCR NEGATIVE NEGATIVE Final    Comment: (NOTE) SARS-CoV-2 target nucleic acids are NOT DETECTED.  The SARS-CoV-2 RNA is generally detectable in upper respiratory specimens during the acute phase of infection. The lowest concentration of SARS-CoV-2 viral copies this assay can detect is 138 copies/mL. A negative result does not preclude SARS-Cov-2 infection and should not be used as the sole basis for treatment or other patient management decisions. A negative result may occur with  improper specimen collection/handling, submission of specimen other than nasopharyngeal swab, presence of viral mutation(s) within the areas targeted by this assay, and inadequate number of viral copies(<138 copies/mL). A negative result must be combined with clinical observations, patient history, and epidemiological information. The expected result is Negative.  Fact Sheet for Patients:  EntrepreneurPulse.com.au  Fact Sheet for Healthcare Providers:  IncredibleEmployment.be  This test is no t  yet approved or cleared by the [place] and  has been authorized for detection and/or diagnosis of SARS-CoV-2 by FDA under an Emergency Use Authorization (EUA). This EUA will remain  in effect (meaning this test can be used) for the duration of the COVID-19 declaration under Section 564(b)(1) of the Act, 21 U.S.C.section 360bbb-3(b)(1), unless the authorization is terminated  or revoked sooner.       Influenza A by PCR NEGATIVE NEGATIVE Final   Influenza B by PCR NEGATIVE NEGATIVE Final    Comment: (NOTE) The Xpert Xpress  SARS-CoV-2/FLU/RSV plus assay is intended as an aid in the diagnosis of influenza from Nasopharyngeal swab specimens and should not be used as a sole basis for treatment. Nasal washings and aspirates are unacceptable for Xpert Xpress SARS-CoV-2/FLU/RSV testing.  Fact Sheet for Patients: EntrepreneurPulse.com.au  Fact Sheet for Healthcare Providers: IncredibleEmployment.be  This test is not yet approved or cleared by the [place] FDA and has been authorized for detection and/or diagnosis of SARS-CoV-2 by FDA under an Emergency Use Authorization (EUA). This EUA will remain in effect (meaning this test can be used) for the duration of the COVID-19 declaration under Section 564(b)(1) of the Act, 21 U.S.C. section 360bbb-3(b)(1), unless the authorization is terminated or revoked.  Performed at [facility], [address] [place], [place] [address]          Radiology Studies: CT ANGIO HEAD NECK W WO CM  Result Date: 09/25/2020 CLINICAL DATA:  Stroke, follow up EXAM: CT ANGIOGRAPHY HEAD AND NECK TECHNIQUE: Multidetector CT imaging of the head and neck was performed using the standard protocol during bolus administration of intravenous contrast. Multiplanar CT image reconstructions and MIPs were obtained to evaluate the vascular anatomy. Carotid stenosis measurements (when applicable) are obtained utilizing NASCET criteria, using the distal internal carotid diameter as the denominator. CONTRAST:  51m OMNIPAQUE IOHEXOL 350 MG/ML SOLN COMPARISON:  09/25/2020. FINDINGS: CT HEAD FINDINGS Brain: Edema and loss of gray-white differentiation in the left temporoparietal cortex and subjacent white matter, compatible with known acute infarct. Similar stent relative to same day MRI. No progressive mass effect. No acute hemorrhage. Temporal lobe predominant atrophy with associated ex vacuo ventricular dilation of the temporal horns. No hydrocephalus,  mass lesion, midline shift, or extra-axial fluid collection. Vascular: See below. Skull: No acute fracture. Sinuses: Left maxillary sinus retention cyst. Orbits: No acute finding. Review of the MIP images confirms the above findings CTA NECK FINDINGS Aortic arch: Atherosclerosis.  Great vessel origins are patent. Right carotid system: Mild atherosclerosis at the carotid bifurcation and involving the proximal ICA without greater than 50% stenosis. Left carotid system: Moderate predominately calcific atherosclerosis at the carotid bifurcation without greater than 50% stenosis. Vertebral arteries: Codominant. Mild left vertebral artery origin stenosis. Streak artifact limits evaluation with potentially severe stenosis of the left vertebral artery at its dural margin and moderate stenosis of the right vertebral artery at C1-C2. moderate stenosis of the distal left intradural vertebral artery. Skeleton: Mild-to-moderate multilevel degenerative change with flowing anterior osteophytes in the visualized upper thoracic spine, potentially related to diffuse idiopathic skeletal hyperostosis. Other neck: Subcentimeter thyroid nodules bilaterally. No followup recommended (ref: J Am Coll Radiol. 2015 Feb;12(2): 143-50). Upper chest: Visualized lung apices are clear. Review of the MIP images confirms the above findings CTA HEAD FINDINGS Anterior circulation: Bilateral intracranial ICAs, MCAs, and ACAs are patent without a proximal hemodynamically significant stenosis. Posterior circulation: Suspected stenosis of the left vertebral artery at its dural margin, described above. Small  left intradural vertebral artery. Right intradural vertebral artery, basilar artery, and bilateral posterior cerebral arteries are patent. Left posterior communicating artery with small left P1 PCA (anatomic variant) with suspected superimposed moderate left P1 PCA stenosis. Right PCA is patent without significant stenosis. Venous sinuses: As permitted  by contrast timing, patent. Anatomic variants: See above. Review of the MIP images confirms the above findings IMPRESSION: CT head: 1. Left temporoparietal acute infarct, as seen on same day MRI. No progressive mass effect or acute hemorrhage. 2. Temporal lobe predominant atrophy, which is nonspecific but can be seen with Alzheimer's disease. CTA Head 1. No large vessel occlusion. 2. Left posterior communicating artery with small left P1 PCA (anatomic variant) with suspected superimposed moderate left P1 PCA stenosis. 3. Moderate distal left intradural vertebral artery stenosis. CTA Neck: 1. Streak artifact limits evaluation with potentially severe stenosis of the left vertebral artery at its dural margin and moderate stenosis of the right vertebral artery at C1-C2. 2. Moderate left and mild right carotid bifurcation atherosclerosis without greater than 50% stenosis. Electronically Signed   By: Margaretha Sheffield M.D.   On: 09/25/2020 12:29   DG Abd 1 View  Result Date: 09/25/2020 CLINICAL DATA:  Constipation. EXAM: ABDOMEN - 1 VIEW COMPARISON:  None. FINDINGS: No abnormal bowel dilatation is noted. Mild amount of stool seen throughout the colon. Status post cholecystectomy. IMPRESSION: Mild stool burden.  Abnormal bowel dilatation. Electronically Signed   By: Marijo Conception M.D.   On: 09/25/2020 14:53        Scheduled Meds:   stroke: mapping our early stages of recovery book   Does not apply Once   aspirin  300 mg Rectal Daily   atorvastatin  80 mg Oral Daily   citalopram  10 mg Oral QODAY   clopidogrel  75 mg Oral Daily   donepezil  10 mg Oral QHS   enoxaparin (LOVENOX) injection  40 mg Subcutaneous Q24H   felodipine  5 mg Oral Daily   gabapentin  200 mg Oral QHS   hydrALAZINE  50 mg Oral Q8H   insulin aspart  0-9 Units Subcutaneous TID AC & HS   irbesartan  75 mg Oral Daily   iron polysaccharides  150 mg Oral Daily   lactase  1 tablet Oral Daily   methocarbamol  500 mg Oral QHS    multivitamin with minerals  1 tablet Oral BID   pantoprazole  40 mg Oral Daily   polycarbophil  625 mg Oral Daily   polyethylene glycol  17 g Oral Daily   senna-docusate  1 tablet Oral BID   Continuous Infusions:   LOS: 2 days    Time spent: 25 minutes    [place] Ace, MD [facility] Pager [phone]  If 7PM-7AM, please contact night-coverage 09/27/2020, 10:30 AM

## 2020-09-27 NOTE — Progress Notes (Signed)
To whom it may concern:  Patient will require a short term nursing home stay- 30 days or less for rehabilitation and strengthening.  The plan is for return home.

## 2020-09-28 LAB — GLUCOSE, CAPILLARY
Glucose-Capillary: 114 mg/dL — ABNORMAL HIGH (ref 70–99)
Glucose-Capillary: 149 mg/dL — ABNORMAL HIGH (ref 70–99)
Glucose-Capillary: 113 mg/dL — ABNORMAL HIGH (ref 70–99)
Glucose-Capillary: 130 mg/dL — ABNORMAL HIGH (ref 70–99)

## 2020-09-28 NOTE — Progress Notes (Signed)
z PROGRESS NOTE    Courtney Lynn  [id number] DOB: Jul 14, 1940 DOA: 09/24/2020 PCP: Tracie Harrier, MD   Brief Narrative:   80 year old female who lives full-time assisted living with history of dementia, breast cancer, depression, dysrhythmia, hypertension, sleep apnea who presented for evaluation of increasing altered mentation.  Unknown last time well.  Significant cognitive decline noted by family members.  On admission MRI and CT imaging survey reveals acute infarct in the left temporoparietal cortex.  Patient has been intermittently agitated and aphasic.  Discussed with neurology.  Neurologic deficits are consistent with mixed moderate aphasia which is expected from infarct in the left temporoparietal cortex.  No emergent large vessel occlusion on CT and MRI imaging survey.  Noted for generalized atrophy consistent with cognitive decline.  Also evidence of intracranial atherosclerosis.  No atrial fibrillation noted on monitor however patient is unable to tolerate telemetry frequently takes off her leads.  Patient's been medically stable though blood pressure has been difficult to control and patient has been intermittently confused and disoriented.  From neurology standpoint patient is stable.  Discharge planning in progress.  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 Unknown last time well, not a tPA candidate Echocardiogram reassuring Plan: Appreciate neurology input and follow-up High intensity statin Dual antiplatelet therapy aspirin and Plavix for 3 months, followed by Plavix  Therapy recommending skilled nursing facility Outpatient referral to cardiology for 30-day event monitor Neurology follow-up 4 to 6 weeks  Dementia/cognitive decline Patient lives full-time at assisted living Previously in  independent living but cognitive decline noted over the past several months Need skilled nursing facility at time of DC [facility] consulted Therapy evaluations complete, recommending SNF Continue home Aricept Medically stable for discharge  Essential hypertension Patient out of window for permissive hypertension Normotensive blood pressure goals Increase hydralazine to 50 mg 3 times daily Increase irbesartan to 75 mg daily As needed IV hydralazine  GERD PTA PPI  Peripheral neuropathy PTA Neurontin  Depression PTA Celexa  Prediabetes A1c reassuring       DVT prophylaxis: SQ Lovenox Code Status: Full Family Communication: None today Disposition Plan: Status is: Inpatient  Remains inpatient appropriate because:Inpatient level of care appropriate due to severity of illness  Dispo: The patient is from: ALF              Anticipated d/c is to: SNF              Patient currently Is medically stable for discharge   Difficult to place patient No  Blood pressure control.  Discharge planning.  Anticipated date of discharge Monday 8/29     Level of care: Med-Surg  Consultants:  Neurology  Procedures:  None  Antimicrobials:  None   Subjective: Seen and examined.  Sleeping on arrival but easily awakened.  Confused.  Objective: Vitals:   09/27/20 2203 09/28/20 0101 09/28/20 0441 09/28/20 0900  BP: 112/83 (!) 183/83 (!) 197/86 (!) 185/79  Pulse: 80 85 84 71  Resp: '16 18 16 17  '$ Temp: 98.2 F (36.8 C) 97.8 F (36.6 C) 98.7 F (37.1 C) 98 F (36.7 C)  TempSrc:      SpO2: 97% 98% 97% 99%  Weight:      Height:        Intake/Output Summary (Last 24 hours) at 09/28/2020 0959 Last data filed at 09/27/2020 1859  Gross per 24 hour  Intake 240 ml  Output --  Net 240 ml   Filed Weights   09/24/20 2238  Weight: 84 kg    Examination:  General exam: No acute distress.  Confused Respiratory system: Clear to auscultation. Respiratory effort normal. Cardiovascular  system: S1 & S2 heard, RRR. No JVD, murmurs, rubs, gallops or clicks. No pedal edema. Gastrointestinal system: Abdomen is nondistended, soft and nontender. No organomegaly or masses felt. Normal bowel sounds heard. Central nervous system: Alert, oriented to person only, difficult to redirect Extremities: Decreased power symmetrically Skin: No rashes, lesions or ulcers Psychiatry: Judgement and insight appear impaired. Mood & affect agitated.     Data Reviewed: I have personally reviewed following labs and imaging studies  CBC: Recent Labs  Lab 09/24/20 2245 09/25/20 0634  WBC 6.5 9.9  NEUTROABS 3.9  --   HGB 11.5* 12.3  HCT 32.9* 35.2*  MCV 94.3 91.2  PLT 367 [id number]   Basic Metabolic Panel: Recent Labs  Lab 09/24/20 2245 09/25/20 0634  NA 134* 134*  K 4.9 4.2  CL 100 102  CO2 29 24  GLUCOSE 113* 111*  BUN 36* 30*  CREATININE 1.09* 0.93  CALCIUM 8.7* 9.2   GFR: Estimated Creatinine Clearance: 46.7 mL/min (by C-G formula based on SCr of 0.93 mg/dL). Liver Function Tests: Recent Labs  Lab 09/24/20 2245  AST 25  ALT 19  ALKPHOS 63  BILITOT 0.5  PROT 6.2*  ALBUMIN 3.7   No results for input(s): LIPASE, AMYLASE in the last 168 hours. No results for input(s): AMMONIA in the last 168 hours. Coagulation Profile: No results for input(s): INR, PROTIME in the last 168 hours. Cardiac Enzymes: No results for input(s): CKTOTAL, CKMB, CKMBINDEX, TROPONINI in the last 168 hours. BNP (last 3 results) No results for input(s): PROBNP in the last 8760 hours. HbA1C: No results for input(s): HGBA1C in the last 72 hours.  CBG: Recent Labs  Lab 09/27/20 0718 09/27/20 1114 09/27/20 1552 09/27/20 2206 09/28/20 0857  GLUCAP 101* 88 108* 98 114*   Lipid Profile: No results for input(s): CHOL, HDL, LDLCALC, TRIG, CHOLHDL, LDLDIRECT in the last 72 hours.  Thyroid Function Tests: No results for input(s): TSH, T4TOTAL, FREET4, T3FREE, THYROIDAB in the last 72 hours. Anemia  Panel: No results for input(s): VITAMINB12, FOLATE, FERRITIN, TIBC, IRON, RETICCTPCT in the last 72 hours. Sepsis Labs: No results for input(s): PROCALCITON, LATICACIDVEN in the last 168 hours.  Recent Results (from the past 240 hour(s))  Resp Panel by RT-PCR (Flu A&B, Covid) Nasopharyngeal Swab     Status: None   Collection Time: 09/24/20 10:45 PM   Specimen: Nasopharyngeal Swab; Nasopharyngeal(NP) swabs in vial transport medium  Result Value Ref Range Status   SARS Coronavirus 2 by RT PCR NEGATIVE NEGATIVE Final    Comment: (NOTE) SARS-CoV-2 target nucleic acids are NOT DETECTED.  The SARS-CoV-2 RNA is generally detectable in upper respiratory specimens during the acute phase of infection. The lowest concentration of SARS-CoV-2 viral copies this assay can detect is 138 copies/mL. A negative result does not preclude SARS-Cov-2 infection and should not be used as the sole basis for treatment or other patient management decisions. A negative result may occur with  improper specimen collection/handling, submission of specimen other than nasopharyngeal swab, presence of viral mutation(s) within the areas targeted by this assay, and inadequate number of viral copies(<138 copies/mL). A negative result must be combined with clinical observations, patient history, and epidemiological information. The expected result is Negative.  Fact Sheet for Patients:  EntrepreneurPulse.com.au  Fact Sheet for Healthcare Providers:  IncredibleEmployment.be  This test is no t yet approved or cleared by the [place] FDA and  has been authorized for detection and/or diagnosis of SARS-CoV-2 by FDA under an Emergency Use Authorization (EUA). This EUA will remain  in effect (meaning this test can be used) for the duration of the COVID-19 declaration under Section 564(b)(1) of the Act, 21 U.S.C.section 360bbb-3(b)(1), unless the authorization is terminated  or  revoked sooner.       Influenza A by PCR NEGATIVE NEGATIVE Final   Influenza B by PCR NEGATIVE NEGATIVE Final    Comment: (NOTE) The Xpert Xpress SARS-CoV-2/FLU/RSV plus assay is intended as an aid in the diagnosis of influenza from Nasopharyngeal swab specimens and should not be used as a sole basis for treatment. Nasal washings and aspirates are unacceptable for Xpert Xpress SARS-CoV-2/FLU/RSV testing.  Fact Sheet for Patients: EntrepreneurPulse.com.au  Fact Sheet for Healthcare Providers: IncredibleEmployment.be  This test is not yet approved or cleared by the [place] FDA and has been authorized for detection and/or diagnosis of SARS-CoV-2 by FDA under an Emergency Use Authorization (EUA). This EUA will remain in effect (meaning this test can be used) for the duration of the COVID-19 declaration under Section 564(b)(1) of the Act, 21 U.S.C. section 360bbb-3(b)(1), unless the authorization is terminated or revoked.  Performed at [facility], [address] [place], [place] [address]          Radiology Studies: No results found.      Scheduled Meds:   stroke: mapping our early stages of recovery book   Does not apply Once   aspirin  300 mg Rectal Daily   atorvastatin  80 mg Oral Daily   citalopram  10 mg Oral QODAY   clopidogrel  75 mg Oral Daily   donepezil  10 mg Oral QHS   enoxaparin (LOVENOX) injection  40 mg Subcutaneous Q24H   felodipine  5 mg Oral Daily   gabapentin  200 mg Oral QHS   hydrALAZINE  50 mg Oral Q8H   insulin aspart  0-9 Units Subcutaneous TID AC & HS   irbesartan  75 mg Oral Daily   iron polysaccharides  150 mg Oral Daily   lactase  1 tablet Oral Daily   methocarbamol  500 mg Oral QHS   multivitamin with minerals  1 tablet Oral BID   pantoprazole  40 mg Oral Daily   polycarbophil  625 mg Oral Daily   polyethylene glycol  17 g Oral Daily   senna-docusate  1 tablet Oral BID    Continuous Infusions:   LOS: 3 days    Time spent: 15 minutes    Sidney Ace, MD [facility] Pager [phone]  If 7PM-7AM, please contact night-coverage 09/28/2020, 9:59 AM

## 2020-09-29 DIAGNOSIS — Z8673 Personal history of transient ischemic attack (TIA), and cerebral infarction without residual deficits: Secondary | ICD-10-CM | POA: Diagnosis not present

## 2020-09-29 LAB — GLUCOSE, CAPILLARY
Glucose-Capillary: 102 mg/dL — ABNORMAL HIGH (ref 70–99)
Glucose-Capillary: 114 mg/dL — ABNORMAL HIGH (ref 70–99)
Glucose-Capillary: 125 mg/dL — ABNORMAL HIGH (ref 70–99)
Glucose-Capillary: 155 mg/dL — ABNORMAL HIGH (ref 70–99)

## 2020-09-29 MED ORDER — HYDRALAZINE HCL 50 MG PO TABS: 50.0000 mg | ORAL_TABLET | Freq: Three times a day (TID) | ORAL | Status: DC

## 2020-09-29 MED ORDER — ATORVASTATIN CALCIUM 80 MG PO TABS: 80.0000 mg | ORAL_TABLET | Freq: Every day | ORAL | Status: DC

## 2020-09-29 MED ORDER — CLOPIDOGREL BISULFATE 75 MG PO TABS: 75.0000 mg | ORAL_TABLET | Freq: Every day | ORAL | 2 refills | Status: DC

## 2020-09-29 MED ORDER — IRBESARTAN 75 MG PO TABS: 75.0000 mg | ORAL_TABLET | Freq: Every day | ORAL | Status: DC

## 2020-09-29 MED ORDER — ASPIRIN EC 81 MG PO TBEC: 81.0000 mg | DELAYED_RELEASE_TABLET | Freq: Every day | ORAL | 0 refills | Status: AC

## 2020-09-29 NOTE — Discharge Summary (Addendum)
Physician Discharge Summary  Courtney Lynn [id number] DOB: September 02, 1940 DOA: 09/24/2020  PCP: Tracie Harrier, MD  Admit date: 09/24/2020 Discharge date: 09/30/2020  Admitted From: Home Disposition:  SNF  Recommendations for Outpatient Follow-up:  Follow up with PCP in 1-2 weeks Please obtain BMP/CBC in one week Please follow up on the following pending results:  Home Health:No Equipment/Devices:None  Discharge Condition:Stable CODE STATUS:Full Diet recommendation: Heart Healthy  Brief/Interim Summary:  80 year old female who lives full-time assisted living with history of dementia, breast cancer, depression, dysrhythmia, hypertension, sleep apnea who presented for evaluation of increasing altered mentation.  Unknown last time well.  Significant cognitive decline noted by family members.  On admission MRI and CT imaging survey reveals acute infarct in the left temporoparietal cortex.  Patient has been intermittently agitated and aphasic.  Discussed with neurology.  Neurologic deficits are consistent with mixed moderate aphasia which is expected from infarct in the left temporoparietal cortex.   No emergent large vessel occlusion on CT and MRI imaging survey.  Noted for generalized atrophy consistent with cognitive decline.  Also evidence of intracranial atherosclerosis.  No atrial fibrillation noted on monitor however patient is unable to tolerate telemetry frequently takes off her leads.   Patient's been medically stable though blood pressure has been difficult to control and patient has been intermittently confused and disoriented.  From neurology standpoint patient is stable.  Discharge planning in progress.  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 Unknown last time well, not a tPA candidate Echocardiogram  reassuring Plan: Appreciate neurology input and follow-up High intensity statin Dual antiplatelet therapy aspirin and Plavix for 3 months, followed by Plavix monotherapy Therapy recommending skilled nursing facility Outpatient referral to cardiology for 30-day event monitor, ambulatory referral placed at time of dc Neurology follow-up 4 to 6 weeks   Dementia/cognitive decline Patient lives full-time at assisted living Previously in independent living but cognitive decline noted over the past several months Need skilled nursing facility at time of DC [facility] consulted Therapy evaluations complete, recommending SNF Continue home Aricept Medically stable for discharge   Essential hypertension Patient out of window for permissive hypertension Normotensive blood pressure goals Increase hydralazine to 50 mg 3 times daily Increase irbesartan to 75 mg daily    GERD PTA PPI   Peripheral neuropathy PTA Neurontin   Depression PTA Celexa   Prediabetes A1c reassuring  Discharge Instructions  Discharge Instructions     Diet - low sodium heart healthy   Complete by: As directed    Increase activity slowly   Complete by: As directed       Allergies as of 09/30/2020       Reactions   Cleocin [clindamycin Hcl] Swelling, Rash, Other (See Comments)   Tongue, lip swelling Tongue, lip swelling Tongue, lip swelling   Pen Vk [penicillin V] Swelling, Rash, Other (See Comments)   Tongue, lip swelling Funny feeling on the tongue Has patient had a PCN reaction causing immediate rash, facial/tongue/throat swelling, SOB or lightheadedness with hypotension:Yes Has patient had a PCN reaction causing severe rash involving mucus membranes or skin necrosis:No Has patient had a PCN reaction that required hospitalization:No Has patient had a PCN reaction occurring within the last 10 years:No If all of the above answers are "NO", then may proceed with Cephalosporin use.        Medication List  STOP taking these medications    doxycycline 100 MG tablet Commonly known as: VIBRA-TABS   HYDROcodone-acetaminophen 5-325 MG tablet Commonly known as: NORCO/VICODIN   meloxicam 15 MG tablet Commonly known as: MOBIC       TAKE these medications    acetaminophen 500 MG tablet Commonly known as: TYLENOL Take 1,000 mg by mouth at bedtime.   aspirin EC 81 MG tablet Take 1 tablet (81 mg total) by mouth daily. Swallow whole.   atorvastatin 80 MG tablet Commonly known as: LIPITOR Take 1 tablet (80 mg total) by mouth daily.   Benefiber Chew Chew 1 tablet by mouth daily.   citalopram 10 MG tablet Commonly known as: CELEXA Take 10 mg by mouth every other day.   clopidogrel 75 MG tablet Commonly known as: PLAVIX Take 1 tablet (75 mg total) by mouth daily.   docusate sodium 100 MG capsule Commonly known as: COLACE Take 100 mg by mouth daily.   donepezil 10 MG tablet Commonly known as: ARICEPT Take 10 mg by mouth at bedtime.   felodipine 5 MG 24 hr tablet Commonly known as: PLENDIL Take 5 mg by mouth daily.   gabapentin 100 MG capsule Commonly known as: NEURONTIN Take 200 mg by mouth at bedtime.   hydrALAZINE 50 MG tablet Commonly known as: APRESOLINE Take 1 tablet (50 mg total) by mouth every 8 (eight) hours. What changed: when to take this   iron polysaccharides 150 MG capsule Commonly known as: NIFEREX Take 150 mg by mouth daily.   lactase 3000 units tablet Commonly known as: LACTAID Take 1 tablet by mouth daily.   methocarbamol 500 MG tablet Commonly known as: ROBAXIN Take 500 mg by mouth at bedtime.   multivitamin tablet Take 1 tablet by mouth 2 (two) times daily.   olmesartan 40 MG tablet Commonly known as: BENICAR Take 40 mg by mouth daily.   omeprazole 20 MG capsule Commonly known as: PRILOSEC Take 20 mg by mouth daily.   OVER THE COUNTER MEDICATION Take 1 capsule by mouth daily. **Shaklee OmegaGuard**   OVER THE COUNTER  MEDICATION Take 1 capsule by mouth 2 (two) times daily. **Omega Q Plus**   OVER THE COUNTER MEDICATION Take 1 tablet by mouth daily. **OptiBiotic**   OVER THE COUNTER MEDICATION Take 2 capsules by mouth daily. **Shaklee OsteoMatrix**   OVER THE COUNTER MEDICATION Take 1 capsule by mouth daily. **LifeVantage Protandim**   OVER THE COUNTER MEDICATION Take 2 tablets by mouth daily with lunch. **Shaklee Blood Pressure**        Contact information for follow-up providers     Anabel Bene, MD Follow up in 4 week(s).   Specialty: Neurology Why: Follow up with any provider at [facility] neurology in 4-6 weeks Contact information: [place] [facility] [place] [address] [phone]         Isaias Cowman, MD. Schedule an appointment as soon as possible for a visit in 2 week(s).   Specialty: Cardiology Contact information: [facility] [place] [address] [phone]              Contact information for after-discharge care     Destination     [facility] .   Service: Skilled Nursing Contact information: [place] [address] [phone]                    Allergies  Allergen Reactions   Cleocin [Clindamycin Hcl]  Swelling, Rash and Other (See Comments)    Tongue, lip swelling Tongue, lip swelling Tongue, lip swelling   Pen Vk [Penicillin V] Swelling, Rash and Other (See Comments)    Tongue, lip swelling Funny feeling on the tongue  Has patient had a PCN reaction causing immediate rash, facial/tongue/throat swelling, SOB or lightheadedness with hypotension:Yes Has patient had a PCN reaction causing severe rash involving mucus membranes or skin necrosis:No Has patient had a PCN reaction that required hospitalization:No Has patient had a PCN reaction occurring within the last 10 years:No If all of the above answers  are "NO", then may proceed with Cephalosporin use.     Consultations: Neurology   Procedures/Studies: CT ANGIO HEAD NECK W WO CM  Result Date: 09/25/2020 CLINICAL DATA:  Stroke, follow up EXAM: CT ANGIOGRAPHY HEAD AND NECK TECHNIQUE: Multidetector CT imaging of the head and neck was performed using the standard protocol during bolus administration of intravenous contrast. Multiplanar CT image reconstructions and MIPs were obtained to evaluate the vascular anatomy. Carotid stenosis measurements (when applicable) are obtained utilizing NASCET criteria, using the distal internal carotid diameter as the denominator. CONTRAST:  65m OMNIPAQUE IOHEXOL 350 MG/ML SOLN COMPARISON:  09/25/2020. FINDINGS: CT HEAD FINDINGS Brain: Edema and loss of gray-white differentiation in the left temporoparietal cortex and subjacent white matter, compatible with known acute infarct. Similar stent relative to same day MRI. No progressive mass effect. No acute hemorrhage. Temporal lobe predominant atrophy with associated ex vacuo ventricular dilation of the temporal horns. No hydrocephalus, mass lesion, midline shift, or extra-axial fluid collection. Vascular: See below. Skull: No acute fracture. Sinuses: Left maxillary sinus retention cyst. Orbits: No acute finding. Review of the MIP images confirms the above findings CTA NECK FINDINGS Aortic arch: Atherosclerosis.  Great vessel origins are patent. Right carotid system: Mild atherosclerosis at the carotid bifurcation and involving the proximal ICA without greater than 50% stenosis. Left carotid system: Moderate predominately calcific atherosclerosis at the carotid bifurcation without greater than 50% stenosis. Vertebral arteries: Codominant. Mild left vertebral artery origin stenosis. Streak artifact limits evaluation with potentially severe stenosis of the left vertebral artery at its dural margin and moderate stenosis of the right vertebral artery at C1-C2. moderate stenosis  of the distal left intradural vertebral artery. Skeleton: Mild-to-moderate multilevel degenerative change with flowing anterior osteophytes in the visualized upper thoracic spine, potentially related to diffuse idiopathic skeletal hyperostosis. Other neck: Subcentimeter thyroid nodules bilaterally. No followup recommended (ref: J Am Coll Radiol. 2015 Feb;12(2): 143-50). Upper chest: Visualized lung apices are clear. Review of the MIP images confirms the above findings CTA HEAD FINDINGS Anterior circulation: Bilateral intracranial ICAs, MCAs, and ACAs are patent without a proximal hemodynamically significant stenosis. Posterior circulation: Suspected stenosis of the left vertebral artery at its dural margin, described above. Small left intradural vertebral artery. Right intradural vertebral artery, basilar artery, and bilateral posterior cerebral arteries are patent. Left posterior communicating artery with small left P1 PCA (anatomic variant) with suspected superimposed moderate left P1 PCA stenosis. Right PCA is patent without significant stenosis. Venous sinuses: As permitted by contrast timing, patent. Anatomic variants: See above. Review of the MIP images confirms the above findings IMPRESSION: CT head: 1. Left temporoparietal acute infarct, as seen on same day MRI. No progressive mass effect or acute hemorrhage. 2. Temporal lobe predominant atrophy, which is nonspecific but can be seen with Alzheimer's disease. CTA Head 1. No large vessel occlusion. 2. Left posterior communicating artery with small left P1 PCA (anatomic variant) with suspected superimposed moderate  left P1 PCA stenosis. 3. Moderate distal left intradural vertebral artery stenosis. CTA Neck: 1. Streak artifact limits evaluation with potentially severe stenosis of the left vertebral artery at its dural margin and moderate stenosis of the right vertebral artery at C1-C2. 2. Moderate left and mild right carotid bifurcation atherosclerosis without  greater than 50% stenosis. Electronically Signed   By: Margaretha Sheffield M.D.   On: 09/25/2020 12:29   DG Abd 1 View  Result Date: 09/25/2020 CLINICAL DATA:  Constipation. EXAM: ABDOMEN - 1 VIEW COMPARISON:  None. FINDINGS: No abnormal bowel dilatation is noted. Mild amount of stool seen throughout the colon. Status post cholecystectomy. IMPRESSION: Mild stool burden.  Abnormal bowel dilatation. Electronically Signed   By: Marijo Conception M.D.   On: 09/25/2020 14:53   CT HEAD WO CONTRAST (5MM)  Result Date: 09/24/2020 CLINICAL DATA:  Altered mental status EXAM: CT HEAD WITHOUT CONTRAST TECHNIQUE: Contiguous axial images were obtained from the base of the skull through the vertex without intravenous contrast. COMPARISON:  07/13/2020 FINDINGS: Brain: Normal anatomic configuration of the brain. There is effacement of the normal gray-white matter differentiation in keeping with an acute to subacute left temporoparietal cortical infarct. Minimal mass effect with effacement of the overlying sulci. No superimposed acute intracranial hemorrhage. There is interval development of a focal region of low attenuation within the right cerebellar hemisphere without significant associated mass effect or acute hemorrhage suspicious for an acute to subacute infarct. Evaluation, however, is slightly limited by motion artifact and streak artifact. Mild parenchymal volume loss is commensurate with the patient's age. Asymmetrically more severe atrophy involving the anterior temporal lobes is again noted, unchanged. No abnormal intra or extra-axial mass lesion or fluid collection. No midline shift. Ventricular size is normal. Vascular: No asymmetric hyperdense vasculature at the skull base. Skull: Intact Sinuses/Orbits: The orbits are unremarkable. Paranasal sinuses are clear save for a mucous retention cyst partially visualized within the left maxillary sinus. Other: Mastoid air cells and middle ear cavities are clear. IMPRESSION:  Acute to subacute cortical infarct within the a left temporoparietal cortex in keeping with a left posterior MCA territory infarct. Possible acute to subacute right cerebellar hemispheric infarct. No superimposed acute hemorrhage. No significant associated mass effect. MRI examination may be helpful for confirmation. These results were called by telephone at the time of interpretation on 09/24/2020 at 11:35 pm to provider [facility] , who verbally acknowledged these results. Electronically Signed   By: Fidela Salisbury M.D.   On: 09/24/2020 23:39   MR BRAIN WO CONTRAST  Result Date: 09/25/2020 CLINICAL DATA:  Acute neuro deficit. EXAM: MRI HEAD WITHOUT CONTRAST TECHNIQUE: Multiplanar, multiecho pulse sequences of the brain and surrounding structures were obtained without intravenous contrast. COMPARISON:  CT head 09/24/2020 FINDINGS: Brain: Acute infarct in the left temporoparietal lobe involving cortex, as noted on CT. No associated hemorrhage. No other acute infarct Generalized atrophy. Advanced atrophy in the anterior temporal lobes bilaterally. Negative for hydrocephalus. Mild white matter hyperintensities bilaterally. Negative for mass lesion. Vascular: Normal arterial flow voids. Skull and upper cervical spine: No focal skeletal abnormality. Sinuses/Orbits: Retention cyst left maxillary sinus. Remaining sinuses clear. Bilateral cataract extraction Other: Motion degraded study IMPRESSION: Acute infarct in the left temporoparietal cortex. No other acute infarct. Advanced atrophy in the temporal lobes bilaterally. Electronically Signed   By: Franchot Gallo M.D.   On: 09/25/2020 09:53   US Carotid Bilateral (at [facility] and [facility] only)  Result Date: 09/25/2020 CLINICAL DATA:  CVA.  History of  hypertension. EXAM: BILATERAL CAROTID DUPLEX ULTRASOUND TECHNIQUE: Pearline Cables scale imaging, color Doppler and duplex ultrasound were performed of bilateral carotid and vertebral arteries in the neck. COMPARISON:  07/13/2020  FINDINGS: Criteria: Quantification of carotid stenosis is based on velocity parameters that correlate the residual internal carotid diameter with NASCET-based stenosis levels, using the diameter of the distal internal carotid lumen as the denominator for stenosis measurement. The following velocity measurements were obtained: RIGHT ICA: 103/15 cm/sec CCA: 123XX123 cm/sec SYSTOLIC ICA/CCA RATIO:  0.9 ECA: 149 cm/sec LEFT Patient refused complete interrogation of the left carotid system as well as the left vertebral artery. RIGHT CAROTID ARTERY: There is a minimal amount of eccentric echogenic plaque within the right carotid bulb (image 17), extending to involve the origin and proximal aspects the right internal carotid artery (image 24), not resulting in elevated peak systolic velocities within the interrogated course the right internal carotid artery to suggest a hemodynamically significant stenosis. Borderline elevated peak systolic velocity in the distal aspect the right internal carotid artery is felt to be factitiously elevated due to sampling at a location of turbulent flow. RIGHT VERTEBRAL ARTERY:  Antegrade Flow LEFT CAROTID ARTERY: There is a moderate to large amount of eccentric echogenic plaque within the left carotid bulb (image 41). Patient refused additional interrogation of the left carotid system. LEFT VERTEBRAL ARTERY: Patient refused evaluation of the left vertebral artery IMPRESSION: 1. Incomplete examination secondary to patient's abrupt termination of the examination prior to complete evaluation of the left carotid system and left vertebral artery. 2. Limited evaluation of the left carotid system demonstrates a moderate to large amount of atherosclerotic plaque. Note, a 50-69% luminal narrowing was demonstrated within the left internal carotid artery on recent carotid Doppler ultrasound performed 07/13/2020. As such, further evaluation with CTA could be performed as indicated. 3. Minimal amount of  right-sided atherosclerotic plaque, not resulting in a hemodynamically significant stenosis. Electronically Signed   By: Sandi Mariscal M.D.   On: 09/25/2020 09:44   ECHOCARDIOGRAM COMPLETE  Result Date: 09/26/2020    ECHOCARDIOGRAM REPORT   Patient Name:   Courtney Lynn Date of Exam: 09/26/2020 Medical Rec #:  [id number]     Height:       61.0 in Accession #:    [id number]    Weight:       185.2 lb Date of Birth:  [birth year]     BSA:          1.828 m Patient Age:    1 years      BP:           172/67 mmHg Patient Gender: F             HR:           88 bpm. Exam Location:  [facility] Procedure: 2D Echo, Color Doppler and Cardiac Doppler Indications:     I63.9 Stroke  History:         Patient has prior history of Echocardiogram examinations, most                  recent 07/14/2020. Signs/Symptoms:Dementia; Risk                  Factors:Hypertension and Sleep Apnea.  Sonographer:     Charmayne Sheer Referring Phys:  [id number] ASHISH ARORA Diagnosing Phys: Donnelly Angelica  Sonographer Comments: Suboptimal apical window and no subcostal window. IMPRESSIONS  1. Left ventricular ejection fraction, by estimation, is 60 to 65%. The left ventricle has normal  function. The left ventricle has no regional wall motion abnormalities. Left ventricular diastolic parameters were normal.  2. Right ventricular systolic function was not well visualized. The right ventricular size is not well visualized.  3. The mitral valve is normal in structure. Trivial mitral valve regurgitation. No evidence of mitral stenosis.  4. The aortic valve is normal in structure. Aortic valve regurgitation is not visualized. No aortic stenosis is present. FINDINGS  Left Ventricle: Left ventricular ejection fraction, by estimation, is 60 to 65%. The left ventricle has normal function. The left ventricle has no regional wall motion abnormalities. The left ventricular internal cavity size was normal in size. There is  no left ventricular hypertrophy. Left ventricular diastolic  parameters were normal. Right Ventricle: The right ventricular size is not well visualized. Right vetricular wall thickness was not well visualized. Right ventricular systolic function was not well visualized. Left Atrium: Left atrial size was normal in size. Right Atrium: Right atrial size was not well visualized. Pericardium: There is no evidence of pericardial effusion. Mitral Valve: The mitral valve is normal in structure. Trivial mitral valve regurgitation. No evidence of mitral valve stenosis. MV peak gradient, 5.7 mmHg. The mean mitral valve gradient is 3.0 mmHg. Tricuspid Valve: The tricuspid valve is not well visualized. Tricuspid valve regurgitation is not demonstrated. Aortic Valve: The aortic valve is normal in structure. Aortic valve regurgitation is not visualized. No aortic stenosis is present. Aortic valve mean gradient measures 8.0 mmHg. Aortic valve peak gradient measures 16.8 mmHg. Aortic valve area, by VTI measures 2.52 cm. Pulmonic Valve: The pulmonic valve was not well visualized. Pulmonic valve regurgitation is not visualized. Aorta: The aortic root is normal in size and structure. IAS/Shunts: The interatrial septum was not assessed.  LEFT VENTRICLE PLAX 2D LVIDd:         4.33 cm  Diastology LVIDs:         2.46 cm  LV e' medial:    7.62 cm/s LV PW:         1.06 cm  LV E/e' medial:  10.9 LV IVS:        1.05 cm  LV e' lateral:   9.25 cm/s LVOT diam:     2.00 cm  LV E/e' lateral: 8.9 LV SV:         95 LV SV Index:   52 LVOT Area:     3.14 cm  LEFT ATRIUM             Index LA diam:        3.80 cm 2.08 cm/m LA Vol (A2C):   39.8 ml 21.77 ml/m LA Vol (A4C):   27.4 ml 14.99 ml/m LA Biplane Vol: 34.1 ml 18.65 ml/m  AORTIC VALVE                    PULMONIC VALVE AV Area (Vmax):    2.41 cm     PV Vmax:       1.62 m/s AV Area (Vmean):   2.57 cm     PV Vmean:      114.000 cm/s AV Area (VTI):     2.52 cm     PV VTI:        0.276 m AV Vmax:           205.00 cm/s  PV Peak grad:  10.5 mmHg AV Vmean:           131.000 cm/s PV Mean grad:  6.0 mmHg AV VTI:  0.376 m AV Peak Grad:      16.8 mmHg AV Mean Grad:      8.0 mmHg LVOT Vmax:         157.00 cm/s LVOT Vmean:        107.000 cm/s LVOT VTI:          0.302 m LVOT/AV VTI ratio: 0.80  AORTA Ao Root diam: 2.90 cm MITRAL VALVE MV Area (PHT): 5.70 cm     SHUNTS MV Area VTI:   3.11 cm     Systemic VTI:  0.30 m MV Peak grad:  5.7 mmHg     Systemic Diam: 2.00 cm MV Mean grad:  3.0 mmHg MV Vmax:       1.19 m/s MV Vmean:      85.7 cm/s MV Decel Time: 133 msec MV E velocity: 82.70 cm/s MV A velocity: 104.00 cm/s MV E/A ratio:  0.80 Donnelly Angelica Electronically signed by Donnelly Angelica Signature Date/Time: 09/26/2020/2:35:31 PM    Final    (Echo, Carotid, EGD, Colonoscopy, ERCP)    Subjective: Seen and examined on day of discharge.  No distress  Discharge Exam: Vitals:   09/30/20 0518 09/30/20 0725  BP: (!) 162/75 (!) 180/76  Pulse: 94 93  Resp: 18 18  Temp: 99 F (37.2 C) 98.2 F (36.8 C)  SpO2: 92% 98%   Vitals:   09/29/20 1444 09/29/20 1537 09/30/20 0518 09/30/20 0725  BP: (!) 154/75 (!) 144/61 (!) 162/75 (!) 180/76  Pulse:  78 94 93  Resp:   18 18  Temp:  97.9 F (36.6 C) 99 F (37.2 C) 98.2 F (36.8 C)  TempSrc:  Oral    SpO2:  98% 92% 98%  Weight:      Height:        General: Pt is alert, awake, not in acute distress Cardiovascular: RRR, S1/S2 +, no rubs, no gallops Respiratory: CTA bilaterally, no wheezing, no rhonchi Abdominal: Soft, NT, ND, bowel sounds + Extremities: no edema, no cyanosis    The results of significant diagnostics from this hospitalization (including imaging, microbiology, ancillary and laboratory) are listed below for reference.     Microbiology: Recent Results (from the past 240 hour(s))  Resp Panel by RT-PCR (Flu A&B, Covid) Nasopharyngeal Swab     Status: None   Collection Time: 09/24/20 10:45 PM   Specimen: Nasopharyngeal Swab; Nasopharyngeal(NP) swabs in vial transport medium  Result Value Ref  Range Status   SARS Coronavirus 2 by RT PCR NEGATIVE NEGATIVE Final    Comment: (NOTE) SARS-CoV-2 target nucleic acids are NOT DETECTED.  The SARS-CoV-2 RNA is generally detectable in upper respiratory specimens during the acute phase of infection. The lowest concentration of SARS-CoV-2 viral copies this assay can detect is 138 copies/mL. A negative result does not preclude SARS-Cov-2 infection and should not be used as the sole basis for treatment or other patient management decisions. A negative result may occur with  improper specimen collection/handling, submission of specimen other than nasopharyngeal swab, presence of viral mutation(s) within the areas targeted by this assay, and inadequate number of viral copies(<138 copies/mL). A negative result must be combined with clinical observations, patient history, and epidemiological information. The expected result is Negative.  Fact Sheet for Patients:  EntrepreneurPulse.com.au  Fact Sheet for Healthcare Providers:  IncredibleEmployment.be  This test is no t yet approved or cleared by the [place] FDA and  has been authorized for detection and/or diagnosis of SARS-CoV-2 by FDA under an Emergency Use Authorization (EUA).  This EUA will remain  in effect (meaning this test can be used) for the duration of the COVID-19 declaration under Section 564(b)(1) of the Act, 21 U.S.C.section 360bbb-3(b)(1), unless the authorization is terminated  or revoked sooner.       Influenza A by PCR NEGATIVE NEGATIVE Final   Influenza B by PCR NEGATIVE NEGATIVE Final    Comment: (NOTE) The Xpert Xpress SARS-CoV-2/FLU/RSV plus assay is intended as an aid in the diagnosis of influenza from Nasopharyngeal swab specimens and should not be used as a sole basis for treatment. Nasal washings and aspirates are unacceptable for Xpert Xpress SARS-CoV-2/FLU/RSV testing.  Fact Sheet for  Patients: EntrepreneurPulse.com.au  Fact Sheet for Healthcare Providers: IncredibleEmployment.be  This test is not yet approved or cleared by the [place] FDA and has been authorized for detection and/or diagnosis of SARS-CoV-2 by FDA under an Emergency Use Authorization (EUA). This EUA will remain in effect (meaning this test can be used) for the duration of the COVID-19 declaration under Section 564(b)(1) of the Act, 21 U.S.C. section 360bbb-3(b)(1), unless the authorization is terminated or revoked.  Performed at [facility], [address] [place], [place] [address]      Labs: BNP (last 3 results) No results for input(s): BNP in the last 8760 hours. Basic Metabolic Panel: Recent Labs  Lab 09/24/20 2245 09/25/20 0634  NA 134* 134*  K 4.9 4.2  CL 100 102  CO2 29 24  GLUCOSE 113* 111*  BUN 36* 30*  CREATININE 1.09* 0.93  CALCIUM 8.7* 9.2   Liver Function Tests: Recent Labs  Lab 09/24/20 2245  AST 25  ALT 19  ALKPHOS 63  BILITOT 0.5  PROT 6.2*  ALBUMIN 3.7   No results for input(s): LIPASE, AMYLASE in the last 168 hours. No results for input(s): AMMONIA in the last 168 hours. CBC: Recent Labs  Lab 09/24/20 2245 09/25/20 0634  WBC 6.5 9.9  NEUTROABS 3.9  --   HGB 11.5* 12.3  HCT 32.9* 35.2*  MCV 94.3 91.2  PLT 367 394   Cardiac Enzymes: No results for input(s): CKTOTAL, CKMB, CKMBINDEX, TROPONINI in the last 168 hours. BNP: Invalid input(s): POCBNP CBG: Recent Labs  Lab 09/29/20 0748 09/29/20 1207 09/29/20 1545 09/29/20 2357 09/30/20 0727  GLUCAP 125* 114* 155* 102* 101*   D-Dimer No results for input(s): DDIMER in the last 72 hours. Hgb A1c No results for input(s): HGBA1C in the last 72 hours. Lipid Profile No results for input(s): CHOL, HDL, LDLCALC, TRIG, CHOLHDL, LDLDIRECT in the last 72 hours. Thyroid function studies No results for input(s): TSH, T4TOTAL, T3FREE, THYROIDAB in the  last 72 hours.  Invalid input(s): FREET3 Anemia work up No results for input(s): VITAMINB12, FOLATE, FERRITIN, TIBC, IRON, RETICCTPCT in the last 72 hours. Urinalysis    Component Value Date/Time   COLORURINE STRAW (A) 09/24/2020 2245   APPEARANCEUR CLEAR (A) 09/24/2020 2245   LABSPEC 1.014 09/24/2020 2245   PHURINE 7.0 09/24/2020 2245   GLUCOSEU NEGATIVE 09/24/2020 2245   HGBUR NEGATIVE 09/24/2020 2245   BILIRUBINUR NEGATIVE 09/24/2020 2245   KETONESUR NEGATIVE 09/24/2020 2245   PROTEINUR NEGATIVE 09/24/2020 2245   NITRITE NEGATIVE 09/24/2020 2245   LEUKOCYTESUR NEGATIVE 09/24/2020 2245   Sepsis Labs Invalid input(s): PROCALCITONIN,  WBC,  LACTICIDVEN Microbiology Recent Results (from the past 240 hour(s))  Resp Panel by RT-PCR (Flu A&B, Covid) Nasopharyngeal Swab     Status: None   Collection Time: 09/24/20 10:45 PM   Specimen: Nasopharyngeal Swab; Nasopharyngeal(NP) swabs  in vial transport medium  Result Value Ref Range Status   SARS Coronavirus 2 by RT PCR NEGATIVE NEGATIVE Final    Comment: (NOTE) SARS-CoV-2 target nucleic acids are NOT DETECTED.  The SARS-CoV-2 RNA is generally detectable in upper respiratory specimens during the acute phase of infection. The lowest concentration of SARS-CoV-2 viral copies this assay can detect is 138 copies/mL. A negative result does not preclude SARS-Cov-2 infection and should not be used as the sole basis for treatment or other patient management decisions. A negative result may occur with  improper specimen collection/handling, submission of specimen other than nasopharyngeal swab, presence of viral mutation(s) within the areas targeted by this assay, and inadequate number of viral copies(<138 copies/mL). A negative result must be combined with clinical observations, patient history, and epidemiological information. The expected result is Negative.  Fact Sheet for Patients:  EntrepreneurPulse.com.au  Fact  Sheet for Healthcare Providers:  IncredibleEmployment.be  This test is no t yet approved or cleared by the [place] FDA and  has been authorized for detection and/or diagnosis of SARS-CoV-2 by FDA under an Emergency Use Authorization (EUA). This EUA will remain  in effect (meaning this test can be used) for the duration of the COVID-19 declaration under Section 564(b)(1) of the Act, 21 U.S.C.section 360bbb-3(b)(1), unless the authorization is terminated  or revoked sooner.       Influenza A by PCR NEGATIVE NEGATIVE Final   Influenza B by PCR NEGATIVE NEGATIVE Final    Comment: (NOTE) The Xpert Xpress SARS-CoV-2/FLU/RSV plus assay is intended as an aid in the diagnosis of influenza from Nasopharyngeal swab specimens and should not be used as a sole basis for treatment. Nasal washings and aspirates are unacceptable for Xpert Xpress SARS-CoV-2/FLU/RSV testing.  Fact Sheet for Patients: EntrepreneurPulse.com.au  Fact Sheet for Healthcare Providers: IncredibleEmployment.be  This test is not yet approved or cleared by the [place] FDA and has been authorized for detection and/or diagnosis of SARS-CoV-2 by FDA under an Emergency Use Authorization (EUA). This EUA will remain in effect (meaning this test can be used) for the duration of the COVID-19 declaration under Section 564(b)(1) of the Act, 21 U.S.C. section 360bbb-3(b)(1), unless the authorization is terminated or revoked.  Performed at [facility], [address], [place], [place] [address]      Time coordinating discharge: Over 30 minutes  SIGNED:   Sidney Ace, MD  [facility] 09/30/2020, 10:27 AM Pager   If 7PM-7AM, please contact night-coverage

## 2020-09-29 NOTE — Care Management Important Message (Signed)
Important Message  Patient Details  Name: OMANI ABDULAZIZ MRN: [id number] Date of Birth: [birth year]   Medicare Important Message Given:  Yes     Dannette Barbara 09/29/2020, 11:37 AM

## 2020-09-29 NOTE — TOC Progression Note (Addendum)
Transition of Care [facility]) - Progression Note    Patient Details  Name: ORPHA OKABE MRN: [id number] Date of Birth: 05-Oct-1940  Transition of Care [facility]) CM/SW [place], LCSW Phone Number: 09/29/2020, 9:22 AM  Clinical Narrative: Marena Chancy if insurance authorization was started. SNF admissions coordinator unsure as well. Left voicemail for [employer] to check. PASARR under level 2 review. They will have to come assess her. [facility] admissions coordinator confirmed they need to have insurance authorization and PASARR before they can accept her.    9:48 am: Insurance authorization had not previously been started. Started this morning for SNF and EMS transport.  12:11 pm: Called and updated brother.  1:32 pm: [employer] requesting that PT work with patient today to make sure she's cooperative with therapy. Sent secure chat to PTA assigned.  2:52 pm: Left voicemail for Marlowe Kays at [facility] to let her know PT note is in.  Expected Discharge Plan: [place] Barriers to Discharge: Continued Medical Work up  Expected Discharge Plan and Services Expected Discharge Plan: [place]   Discharge Planning Services: CM Consult Post Acute Care Choice: [place] Living arrangements for the past 2 months: [place] Expected Discharge Date: 09/29/20               DME Arranged: N/A DME Agency: NA       HH Arranged: NA HH Agency: NA         Social Determinants of Health (SDOH) Interventions    Readmission Risk Interventions No flowsheet data found.

## 2020-09-29 NOTE — Progress Notes (Signed)
Physical Therapy Treatment Patient Details Name: Courtney Lynn MRN: [id number] DOB: 03-12-1940 Today's Date: 09/29/2020    History of Present Illness Pt is an 80 y/o F with PMH: BRCA, HTN, bradycardia, dementia, and pulmonary HTN. Adm for fall in June 2022. Pt BIB EMS from [facility] ALF d/t AMS. 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    PT Comments    Pt in bed.  Stating she needs to get something "out of there"  She is able to get up and walk to bathroom to void, 20' to chair then an additional 30' in room with RW and min guard.  She is positioned in chair for lunch where after set up she is able to feed herself.  Initially denies being hungry and stated "I don't like that" but does eat some of everything she is given on try.    Pt remains appropriate for rehab.  She is motivated to participate and is able to complete tasks with cues/set up.  While aphasia does pose some barriers, it does not significantly impact her ability or willingness to participate.   Follow Up Recommendations  SNF     Equipment Recommendations  Rolling walker with 5" wheels    Recommendations for Other Services       Precautions / Restrictions Precautions Precautions: Fall Restrictions Weight Bearing Restrictions: No    Mobility  Bed Mobility Overal bed mobility: Needs Assistance Bed Mobility: Supine to Sit     Supine to sit: Min guard;Supervision;HOB elevated          Transfers Overall transfer level: Needs assistance Equipment used: Rolling walker (2 wheeled) Transfers: Sit to/from Stand Sit to Stand: Min guard;Min assist         General transfer comment: visual/tactile cues for safety.  Ambulation/Gait Ambulation/Gait assistance: Min guard;Min assist Gait Distance (Feet): 30 Feet Assistive device: Rolling walker (2 wheeled) Gait Pattern/deviations: Step-to pattern;Decreased step length - right;Decreased step length - left;Trunk  flexed Gait velocity: decreased   General Gait Details: generally steady gait with tactile cues for driection and walker use   Stairs             Wheelchair Mobility    Modified Rankin (Stroke Patients Only)       Balance Overall balance assessment: Needs assistance Sitting-balance support: No upper extremity supported;Feet supported Sitting balance-Leahy Scale: Good     Standing balance support: Bilateral upper extremity supported Standing balance-Leahy Scale: Fair                              Cognition Arousal/Alertness: Awake/alert Behavior During Therapy: WFL for tasks assessed/performed;Flat affect Overall Cognitive Status: No family/caregiver present to determine baseline cognitive functioning                                 General Comments: aphasia limits communication but I am able to make out most of her want/needs during session.      Exercises      General Comments        Pertinent Vitals/Pain Pain Assessment: No/denies pain    Home Living                      Prior Function            PT Goals (current goals can now be found in the  care plan section) Progress towards PT goals: Progressing toward goals    Frequency    Min 2X/week      PT Plan Current plan remains appropriate    Co-evaluation              AM-PAC PT "6 Clicks" Mobility   Outcome Measure  Help needed turning from your back to your side while in a flat bed without using bedrails?: None Help needed moving from lying on your back to sitting on the side of a flat bed without using bedrails?: A Little Help needed moving to and from a bed to a chair (including a wheelchair)?: A Little Help needed standing up from a chair using your arms (e.g., wheelchair or bedside chair)?: A Little Help needed to walk in hospital room?: A Little Help needed climbing 3-5 steps with a railing? : A Little 6 Click Score: 19    End of Session  Equipment Utilized During Treatment: Gait belt Activity Tolerance: Patient tolerated treatment well Patient left: in chair;with call bell/phone within reach;with chair alarm set Nurse Communication: Mobility status PT Visit Diagnosis: Hemiplegia and hemiparesis Hemiplegia - Right/Left: Right Hemiplegia - dominant/non-dominant: Dominant Hemiplegia - caused by: Cerebral infarction     Time: 0224-0247 PT Time Calculation (min) (ACUTE ONLY): 23 min  Charges:  $Gait Training: 8-22 mins $Therapeutic Activity: 8-22 mins                    Chesley Noon, PTA 09/29/20, 2:50 PM 09/29/2020, 2:47 PM

## 2020-09-30 DIAGNOSIS — R278 Other lack of coordination: Secondary | ICD-10-CM | POA: Diagnosis not present

## 2020-09-30 DIAGNOSIS — Z09 Encounter for follow-up examination after completed treatment for conditions other than malignant neoplasm: Secondary | ICD-10-CM | POA: Diagnosis not present

## 2020-09-30 DIAGNOSIS — R2681 Unsteadiness on feet: Secondary | ICD-10-CM | POA: Diagnosis not present

## 2020-09-30 DIAGNOSIS — R2689 Other abnormalities of gait and mobility: Secondary | ICD-10-CM | POA: Diagnosis not present

## 2020-09-30 DIAGNOSIS — I639 Cerebral infarction, unspecified: Secondary | ICD-10-CM | POA: Diagnosis not present

## 2020-09-30 DIAGNOSIS — F0280 Dementia in other diseases classified elsewhere without behavioral disturbance: Secondary | ICD-10-CM | POA: Diagnosis not present

## 2020-09-30 DIAGNOSIS — G309 Alzheimer's disease, unspecified: Secondary | ICD-10-CM | POA: Diagnosis not present

## 2020-09-30 DIAGNOSIS — Z743 Need for continuous supervision: Secondary | ICD-10-CM | POA: Diagnosis not present

## 2020-09-30 DIAGNOSIS — M4310 Spondylolisthesis, site unspecified: Secondary | ICD-10-CM | POA: Diagnosis not present

## 2020-09-30 DIAGNOSIS — R413 Other amnesia: Secondary | ICD-10-CM | POA: Diagnosis not present

## 2020-09-30 DIAGNOSIS — M170 Bilateral primary osteoarthritis of knee: Secondary | ICD-10-CM | POA: Diagnosis not present

## 2020-09-30 DIAGNOSIS — R4701 Aphasia: Secondary | ICD-10-CM | POA: Diagnosis not present

## 2020-09-30 DIAGNOSIS — R001 Bradycardia, unspecified: Secondary | ICD-10-CM | POA: Diagnosis not present

## 2020-09-30 DIAGNOSIS — Z23 Encounter for immunization: Secondary | ICD-10-CM | POA: Diagnosis not present

## 2020-09-30 DIAGNOSIS — R4189 Other symptoms and signs involving cognitive functions and awareness: Secondary | ICD-10-CM | POA: Diagnosis not present

## 2020-09-30 DIAGNOSIS — Z741 Need for assistance with personal care: Secondary | ICD-10-CM | POA: Diagnosis not present

## 2020-09-30 DIAGNOSIS — M545 Low back pain, unspecified: Secondary | ICD-10-CM | POA: Diagnosis not present

## 2020-09-30 DIAGNOSIS — I63412 Cerebral infarction due to embolism of left middle cerebral artery: Secondary | ICD-10-CM | POA: Diagnosis not present

## 2020-09-30 DIAGNOSIS — R279 Unspecified lack of coordination: Secondary | ICD-10-CM | POA: Diagnosis not present

## 2020-09-30 DIAGNOSIS — G9340 Encephalopathy, unspecified: Secondary | ICD-10-CM | POA: Diagnosis not present

## 2020-09-30 DIAGNOSIS — M6281 Muscle weakness (generalized): Secondary | ICD-10-CM | POA: Diagnosis not present

## 2020-09-30 LAB — GLUCOSE, CAPILLARY: Glucose-Capillary: 101 mg/dL — ABNORMAL HIGH (ref 70–99)

## 2020-09-30 NOTE — TOC Transition Note (Signed)
Transition of Care [facility]) - CM/SW Discharge Note   Patient Details  Name: MONDAY AMTHOR MRN: [id number] Date of Birth: [birth year]  Transition of Care [facility]) CM/SW Contact:  Shelbie Hutching, RN Phone Number: 09/30/2020, 10:49 AM   Clinical Narrative:    Patient is medically cleared for discharge to [facility] for rehab today.  Patient going to room 111 at [facility], bedside RN will call report.  RNCM has arranged EMS transport, patient is 3rd on the list for pick up.  Patient's brother, [name], notified of discharge today.    Final next level of care: Skilled Nursing Facility Barriers to Discharge: Barriers Resolved   Patient Goals and CMS Choice Patient states their goals for this hospitalization and ongoing recovery are:: patient unable to state goals at this time CMS Medicare.gov Compare Post Acute Care list provided to:: Patient Represenative (must comment) Choice offered to / list presented to : Sibling  Discharge Placement PASRR number recieved: 09/29/20            Patient chooses bed at: [facility] Patient to be transferred to facility by: [facility] Name of family member notified: [name] (brother) Patient and family notified of of transfer: 09/30/20  Discharge Plan and Services   Discharge Planning Services: CM Consult Post Acute Care Choice: [place]          DME Arranged: N/A DME Agency: NA       HH Arranged: NA HH Agency: NA        Social Determinants of Health (SDOH) Interventions     Readmission Risk Interventions No flowsheet data found.

## 2020-09-30 NOTE — TOC Progression Note (Signed)
Transition of Care [facility]) - Progression Note    Patient Details  Name: Courtney Lynn MRN: [id number] Date of Birth: 10/15/1940  Transition of Care [facility]) CM/SW [place], LCSW Phone Number: 09/30/2020, 8:12 AM  Clinical Narrative:   Insurance authorization approved for SNF [phone]) and EMS transport [phone]). PASARR obtained: [id number].  Expected Discharge Plan: [place] Barriers to Discharge: Continued Medical Work up  Expected Discharge Plan and Services Expected Discharge Plan: [place]   Discharge Planning Services: CM Consult Post Acute Care Choice: [place] Living arrangements for the past 2 months: [place] Expected Discharge Date: 09/29/20               DME Arranged: N/A DME Agency: NA       HH Arranged: NA HH Agency: NA         Social Determinants of Health (SDOH) Interventions    Readmission Risk Interventions No flowsheet data found.

## 2020-09-30 NOTE — Progress Notes (Signed)
z PROGRESS NOTE    Courtney Lynn  [id number] DOB: Oct 24, 1940 DOA: 09/24/2020 PCP: Tracie Harrier, MD   Brief Narrative:   80 year old female who lives full-time assisted living with history of dementia, breast cancer, depression, dysrhythmia, hypertension, sleep apnea who presented for evaluation of increasing altered mentation.  Unknown last time well.  Significant cognitive decline noted by family members.  On admission MRI and CT imaging survey reveals acute infarct in the left temporoparietal cortex.  Patient has been intermittently agitated and aphasic.  Discussed with neurology.  Neurologic deficits are consistent with mixed moderate aphasia which is expected from infarct in the left temporoparietal cortex.  No emergent large vessel occlusion on CT and MRI imaging survey.  Noted for generalized atrophy consistent with cognitive decline.  Also evidence of intracranial atherosclerosis.  No atrial fibrillation noted on monitor however patient is unable to tolerate telemetry frequently takes off her leads.  Patient's been medically stable though blood pressure has been difficult to control and patient has been intermittently confused and disoriented.  From neurology standpoint patient is stable.  Discharge planning in progress.  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 Unknown last time well, not a tPA candidate Echocardiogram reassuring Plan: Appreciate neurology input and follow-up High intensity statin Dual antiplatelet therapy aspirin and Plavix for 3 months, followed by Plavix  Therapy recommending skilled nursing facility Outpatient referral to cardiology for 30-day event monitor Neurology follow-up 4 to 6 weeks  Dementia/cognitive decline Patient lives full-time at assisted living Previously in  independent living but cognitive decline noted over the past several months Need skilled nursing facility at time of DC [facility] consulted Therapy evaluations complete, recommending SNF Continue home Aricept Medically stable for discharge  Essential hypertension Patient out of window for permissive hypertension Normotensive blood pressure goals Increase hydralazine to 50 mg 3 times daily, continue on DC Increase irbesartan to 75 mg daily, can resume home olmesartan on dc As needed IV hydralazine  GERD PTA PPI  Peripheral neuropathy PTA Neurontin  Depression PTA Celexa  Prediabetes A1c reassuring       DVT prophylaxis: SQ Lovenox Code Status: Full Family Communication: None today Disposition Plan: Status is: Inpatient  Remains inpatient appropriate because:Inpatient level of care appropriate due to severity of illness  Dispo: The patient is from: ALF              Anticipated d/c is to: SNF              Patient currently Is medically stable for discharge   Difficult to place patient No  Blood pressure control.  Discharge planning.  Anticipated date of discharge Monday 8/29     Level of care: Med-Surg  Consultants:  Neurology  Procedures:  None  Antimicrobials:  None   Subjective: Seen and examined.  Sleeping on arrival but easily awakened.  Confused.  Objective: Vitals:   09/29/20 1444 09/29/20 1537 09/30/20 0518 09/30/20 0725  BP: (!) 154/75 (!) 144/61 (!) 162/75 (!) 180/76  Pulse:  78 94 93  Resp:   18 18  Temp:  97.9 F (36.6 C) 99 F (37.2 C) 98.2 F (36.8 C)  TempSrc:  Oral    SpO2:  98% 92% 98%  Weight:      Height:        Intake/Output Summary (Last 24 hours) at 09/30/2020  [place] filed at 09/29/2020 1900 Gross per 24 hour  Intake 0 ml  Output --  Net 0 ml   Filed Weights   09/24/20 2238  Weight: 84 kg    Examination:  General exam: No acute distress.  Confused Respiratory system: Clear to auscultation. Respiratory  effort normal. Cardiovascular system: S1 & S2 heard, RRR. No JVD, murmurs, rubs, gallops or clicks. No pedal edema. Gastrointestinal system: Abdomen is nondistended, soft and nontender. No organomegaly or masses felt. Normal bowel sounds heard. Central nervous system: Alert, oriented to person only, difficult to redirect Extremities: Decreased power symmetrically Skin: No rashes, lesions or ulcers Psychiatry: Judgement and insight appear impaired. Mood & affect agitated.     Data Reviewed: I have personally reviewed following labs and imaging studies  CBC: Recent Labs  Lab 09/24/20 2245 09/25/20 0634  WBC 6.5 9.9  NEUTROABS 3.9  --   HGB 11.5* 12.3  HCT 32.9* 35.2*  MCV 94.3 91.2  PLT 367 [id number]   Basic Metabolic Panel: Recent Labs  Lab 09/24/20 2245 09/25/20 0634  NA 134* 134*  K 4.9 4.2  CL 100 102  CO2 29 24  GLUCOSE 113* 111*  BUN 36* 30*  CREATININE 1.09* 0.93  CALCIUM 8.7* 9.2   GFR: Estimated Creatinine Clearance: 45.9 mL/min (by C-G formula based on SCr of 0.93 mg/dL). Liver Function Tests: Recent Labs  Lab 09/24/20 2245  AST 25  ALT 19  ALKPHOS 63  BILITOT 0.5  PROT 6.2*  ALBUMIN 3.7   No results for input(s): LIPASE, AMYLASE in the last 168 hours. No results for input(s): AMMONIA in the last 168 hours. Coagulation Profile: No results for input(s): INR, PROTIME in the last 168 hours. Cardiac Enzymes: No results for input(s): CKTOTAL, CKMB, CKMBINDEX, TROPONINI in the last 168 hours. BNP (last 3 results) No results for input(s): PROBNP in the last 8760 hours. HbA1C: No results for input(s): HGBA1C in the last 72 hours.  CBG: Recent Labs  Lab 09/29/20 0748 09/29/20 1207 09/29/20 1545 09/29/20 2357 09/30/20 0727  GLUCAP 125* 114* 155* 102* 101*   Lipid Profile: No results for input(s): CHOL, HDL, LDLCALC, TRIG, CHOLHDL, LDLDIRECT in the last 72 hours.  Thyroid Function Tests: No results for input(s): TSH, T4TOTAL, FREET4, T3FREE,  THYROIDAB in the last 72 hours. Anemia Panel: No results for input(s): VITAMINB12, FOLATE, FERRITIN, TIBC, IRON, RETICCTPCT in the last 72 hours. Sepsis Labs: No results for input(s): PROCALCITON, LATICACIDVEN in the last 168 hours.  Recent Results (from the past 240 hour(s))  Resp Panel by RT-PCR (Flu A&B, Covid) Nasopharyngeal Swab     Status: None   Collection Time: 09/24/20 10:45 PM   Specimen: Nasopharyngeal Swab; Nasopharyngeal(NP) swabs in vial transport medium  Result Value Ref Range Status   SARS Coronavirus 2 by RT PCR NEGATIVE NEGATIVE Final    Comment: (NOTE) SARS-CoV-2 target nucleic acids are NOT DETECTED.  The SARS-CoV-2 RNA is generally detectable in upper respiratory specimens during the acute phase of infection. The lowest concentration of SARS-CoV-2 viral copies this assay can detect is 138 copies/mL. A negative result does not preclude SARS-Cov-2 infection and should not be used as the sole basis for treatment or other patient management decisions. A negative result may occur with  improper specimen collection/handling, submission of specimen other than nasopharyngeal swab, presence of viral mutation(s) within the areas targeted by this assay, and inadequate number of viral copies(<138 copies/mL). A negative result must be combined with clinical observations, patient history, and  epidemiological information. The expected result is Negative.  Fact Sheet for Patients:  EntrepreneurPulse.com.au  Fact Sheet for Healthcare Providers:  IncredibleEmployment.be  This test is no t yet approved or cleared by the [place] FDA and  has been authorized for detection and/or diagnosis of SARS-CoV-2 by FDA under an Emergency Use Authorization (EUA). This EUA will remain  in effect (meaning this test can be used) for the duration of the COVID-19 declaration under Section 564(b)(1) of the Act, 21 U.S.C.section 360bbb-3(b)(1), unless the  authorization is terminated  or revoked sooner.       Influenza A by PCR NEGATIVE NEGATIVE Final   Influenza B by PCR NEGATIVE NEGATIVE Final    Comment: (NOTE) The Xpert Xpress SARS-CoV-2/FLU/RSV plus assay is intended as an aid in the diagnosis of influenza from Nasopharyngeal swab specimens and should not be used as a sole basis for treatment. Nasal washings and aspirates are unacceptable for Xpert Xpress SARS-CoV-2/FLU/RSV testing.  Fact Sheet for Patients: EntrepreneurPulse.com.au  Fact Sheet for Healthcare Providers: IncredibleEmployment.be  This test is not yet approved or cleared by the [place] FDA and has been authorized for detection and/or diagnosis of SARS-CoV-2 by FDA under an Emergency Use Authorization (EUA). This EUA will remain in effect (meaning this test can be used) for the duration of the COVID-19 declaration under Section 564(b)(1) of the Act, 21 U.S.C. section 360bbb-3(b)(1), unless the authorization is terminated or revoked.  Performed at [facility], [address] [place], [place] [address]          Radiology Studies: No results found.      Scheduled Meds:   stroke: mapping our early stages of recovery book   Does not apply Once   aspirin  300 mg Rectal Daily   atorvastatin  80 mg Oral Daily   citalopram  10 mg Oral QODAY   clopidogrel  75 mg Oral Daily   donepezil  10 mg Oral QHS   enoxaparin (LOVENOX) injection  40 mg Subcutaneous Q24H   felodipine  5 mg Oral Daily   gabapentin  200 mg Oral QHS   hydrALAZINE  50 mg Oral Q8H   insulin aspart  0-9 Units Subcutaneous TID AC & HS   irbesartan  75 mg Oral Daily   iron polysaccharides  150 mg Oral Daily   lactase  1 tablet Oral Daily   methocarbamol  500 mg Oral QHS   multivitamin with minerals  1 tablet Oral BID   pantoprazole  40 mg Oral Daily   polycarbophil  625 mg Oral Daily   polyethylene glycol  17 g Oral Daily    senna-docusate  1 tablet Oral BID   Continuous Infusions:   LOS: 5 days    Time spent: 15 minutes    Sidney Ace, MD [facility] Pager [phone]  If 7PM-7AM, please contact night-coverage 09/30/2020, 10:28 AM

## 2020-09-30 NOTE — NC FL2 (Signed)
[place] LEVEL OF CARE SCREENING TOOL     IDENTIFICATION  Patient Name: GLENDY BILLINGSLY Birthdate: 05/08/1940 Sex: female Admission Date (Current Location): 09/24/2020  [place] and [place] Number:  Engineering geologist and Address:  [facility], [address], [place], [place] [address]      Provider Number: [id number]  Attending Physician Name and Address:  Sidney Ace, MD  Relative Name and Phone Number:  Shane Crutch (brother) [phone]    Current Level of Care: Hospital Recommended Level of Care: [place] Prior Approval Number:    Date Approved/Denied:   PASRR Number: [id number]  Discharge Plan: SNF    Current Diagnoses: Patient Active Problem List   Diagnosis Date Noted   Acute encephalopathy 09/25/2020   Fall 07/14/2020   Intracranial bleed ([place]) 07/13/2020   Dementia ([place]) 07/13/2020   Closed dislocation of right thumb 07/13/2020   AKI (acute kidney injury) ([place]) 07/13/2020   Syncope and collapse 07/13/2020   Major depressive disorder, recurrent, in remission ([place]) 10/17/2018   Loss of memory 03/14/2018   Poorly-controlled hypertension 01/05/2018   Hemorrhoids 12/15/2017   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   Postmenopausal osteoporosis 11/23/2017   Lumbar radiculopathy 07/14/2017   Osteopenia of the elderly 05/24/2017   Dizziness 12/13/2015   Intermittent vertigo 12/13/2015   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 ([place]) 12/07/2015   Fracture of left foot 10/26/2015   OSA on CPAP 04/14/2015   Nonrheumatic mitral valve insufficiency 09/10/2014   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[place]) 09/10/2014   Nonrheumatic mitral valve prolapse 09/10/2014   Premature ventricular contractions 09/10/2014   Other secondary pulmonary hypertension ([place]) 09/10/2014   Ventricular premature depolarization 09/10/2014   Bradycardia 07/01/2014   Hypertension 07/01/2014   Hyponatremia 07/01/2014    Hyposmolality and/or hyponatremia 07/01/2014   Chronic anemia 04/23/2014   Anemia 04/23/2014   Body mass index (BMI) of 37.0-37.9 in adult 03/28/2014   Chronic insomnia 03/28/2014   Depressive disorder 03/28/2014   Essential hypertension 03/28/2014   Gastroesophageal reflux disease without esophagitis 03/28/2014   Pre-diabetes 03/28/2014   Primary osteoarthritis of both knees 03/28/2014   Insomnia 03/28/2014   Depression, major, single episode, complete remission ([place]) 03/28/2014   Other obesity 03/28/2014   Acute bronchitis, viral 01/30/2014    Orientation RESPIRATION BLADDER Height & Weight     Self  Normal Incontinent Weight: 84 kg Height:  '5\' 1"'$  (154.9 cm)  BEHAVIORAL SYMPTOMS/MOOD NEUROLOGICAL BOWEL NUTRITION STATUS      Continent Diet (see discharge summary)  AMBULATORY STATUS COMMUNICATION OF NEEDS Skin   Limited Assist Verbally Normal                       Personal Care Assistance Level of Assistance  Bathing, Feeding, Dressing Bathing Assistance: Limited assistance Feeding assistance: Limited assistance Dressing Assistance: Limited assistance     Functional Limitations Info  Sight, Hearing, Speech Sight Info: Adequate Hearing Info: Adequate Speech Info: Adequate    SPECIAL CARE FACTORS FREQUENCY  PT (By licensed PT), OT (By licensed OT)     PT Frequency: 5 days per week OT Frequency: 5 days per week            Contractures Contractures Info: Not present    Additional Factors Info  Code Status, Allergies Code Status Info: Full Allergies Info: Cleocin, PenicillinV           Current Medications (09/30/2020):  This is the current hospital active medication  list Current Facility-Administered Medications  Medication Dose Route Frequency Provider Last Rate Last Admin    stroke: mapping our early stages of recovery book   Does not apply Once Mansy, Jan A, MD       acetaminophen (TYLENOL) tablet 650 mg  650 mg Oral Q6H PRN Mansy, Jan A, MD        Or   acetaminophen (TYLENOL) suppository 650 mg  650 mg Rectal Q6H PRN Mansy, Jan A, MD       aspirin suppository 300 mg  300 mg Rectal Daily Priscella Mann, Sudheer B, MD   300 mg at 09/29/20 0913   atorvastatin (LIPITOR) tablet 80 mg  80 mg Oral Daily Ralene Muskrat B, MD   80 mg at 09/29/20 0854   bisacodyl (DULCOLAX) suppository 10 mg  10 mg Rectal Daily PRN Ralene Muskrat B, MD       citalopram (CELEXA) tablet 10 mg  10 mg Oral QODAY Mansy, Jan A, MD   10 mg at 09/29/20 0855   clopidogrel (PLAVIX) tablet 75 mg  75 mg Oral Daily Amie Portland, MD   75 mg at 09/29/20 0855   donepezil (ARICEPT) tablet 10 mg  10 mg Oral QHS Mansy, Jan A, MD   10 mg at 09/29/20 2219   enoxaparin (LOVENOX) injection 40 mg  40 mg Subcutaneous Q24H Mansy, Jan A, MD   40 mg at 09/29/20 0853   felodipine (PLENDIL) 24 hr tablet 5 mg  5 mg Oral Daily Mansy, Jan A, MD   5 mg at 09/29/20 0856   gabapentin (NEURONTIN) capsule 200 mg  200 mg Oral QHS Mansy, Jan A, MD   200 mg at 09/29/20 2219   haloperidol lactate (HALDOL) injection 2 mg  2 mg Intramuscular Q6H PRN Mansy, Jan A, MD   2 mg at 09/26/20 0437   hydrALAZINE (APRESOLINE) injection 10 mg  10 mg Intravenous Q4H PRN Ralene Muskrat B, MD       hydrALAZINE (APRESOLINE) tablet 50 mg  50 mg Oral Q8H Sreenath, Sudheer B, MD   50 mg at 09/30/20 0557   insulin aspart (novoLOG) injection 0-9 Units  0-9 Units Subcutaneous TID AC & HS Mansy, Jan A, MD   2 Units at 09/29/20 1701   irbesartan (AVAPRO) tablet 75 mg  75 mg Oral Daily Ralene Muskrat B, MD   75 mg at 09/29/20 0855   iron polysaccharides (NIFEREX) capsule 150 mg  150 mg Oral Daily Mansy, Jan A, MD   150 mg at 09/29/20 0856   lactase (LACTAID) tablet 3,000 Units  1 tablet Oral Daily Mansy, Jan A, MD   3,000 Units at 09/29/20 0856   magnesium hydroxide (MILK OF MAGNESIA) suspension 30 mL  30 mL Oral Daily PRN Mansy, Jan A, MD       methocarbamol (ROBAXIN) tablet 500 mg  500 mg Oral QHS Mansy, Jan A, MD   500 mg  at 09/29/20 2219   multivitamin with minerals tablet 1 tablet  1 tablet Oral BID Mansy, Jan A, MD   1 tablet at 09/29/20 2219   ondansetron (ZOFRAN) tablet 4 mg  4 mg Oral Q6H PRN Mansy, Jan A, MD       Or   ondansetron [facility]) injection 4 mg  4 mg Intravenous Q6H PRN Mansy, Jan A, MD   4 mg at 09/25/20 0209   pantoprazole (PROTONIX) EC tablet 40 mg  40 mg Oral Daily Mansy, Jan A, MD   40 mg at 09/29/20 [phone]  polycarbophil (FIBERCON) tablet 625 mg  625 mg Oral Daily Mansy, Jan A, MD   625 mg at 09/29/20 0856   polyethylene glycol (MIRALAX / GLYCOLAX) packet 17 g  17 g Oral Daily Ralene Muskrat B, MD   17 g at 09/29/20 0901   senna-docusate (Senokot-S) tablet 1 tablet  1 tablet Oral BID Ralene Muskrat B, MD   1 tablet at 09/29/20 2219   traZODone (DESYREL) tablet 25 mg  25 mg Oral QHS PRN Mansy, Jan A, MD       ziprasidone (GEODON) injection 10 mg  10 mg Intramuscular Q4H PRN Ralene Muskrat B, MD   10 mg at 09/27/20 1453     Discharge Medications: Please see discharge summary for a list of discharge medications.  Relevant Imaging Results:  Relevant Lab Results:   Additional Information SS# [id number]  Shelbie Hutching, RN

## 2020-10-01 DIAGNOSIS — Z09 Encounter for follow-up examination after completed treatment for conditions other than malignant neoplasm: Secondary | ICD-10-CM | POA: Diagnosis not present

## 2020-10-15 DIAGNOSIS — G309 Alzheimer's disease, unspecified: Secondary | ICD-10-CM | POA: Diagnosis not present

## 2020-10-15 DIAGNOSIS — R413 Other amnesia: Secondary | ICD-10-CM | POA: Diagnosis not present

## 2020-10-15 DIAGNOSIS — F0280 Dementia in other diseases classified elsewhere without behavioral disturbance: Secondary | ICD-10-CM | POA: Diagnosis not present

## 2020-10-16 DIAGNOSIS — Z23 Encounter for immunization: Secondary | ICD-10-CM | POA: Diagnosis not present

## 2020-10-16 DIAGNOSIS — R001 Bradycardia, unspecified: Secondary | ICD-10-CM | POA: Diagnosis not present

## 2020-10-29 DIAGNOSIS — R278 Other lack of coordination: Secondary | ICD-10-CM | POA: Diagnosis not present

## 2020-10-29 DIAGNOSIS — M4310 Spondylolisthesis, site unspecified: Secondary | ICD-10-CM | POA: Diagnosis not present

## 2020-10-29 DIAGNOSIS — R4189 Other symptoms and signs involving cognitive functions and awareness: Secondary | ICD-10-CM | POA: Diagnosis not present

## 2020-10-29 DIAGNOSIS — M6281 Muscle weakness (generalized): Secondary | ICD-10-CM | POA: Diagnosis not present

## 2020-10-29 DIAGNOSIS — M170 Bilateral primary osteoarthritis of knee: Secondary | ICD-10-CM | POA: Diagnosis not present

## 2020-10-29 DIAGNOSIS — M545 Low back pain, unspecified: Secondary | ICD-10-CM | POA: Diagnosis not present

## 2020-10-29 DIAGNOSIS — R2681 Unsteadiness on feet: Secondary | ICD-10-CM | POA: Diagnosis not present

## 2020-11-01 DIAGNOSIS — R278 Other lack of coordination: Secondary | ICD-10-CM | POA: Diagnosis not present

## 2020-11-01 DIAGNOSIS — M4310 Spondylolisthesis, site unspecified: Secondary | ICD-10-CM | POA: Diagnosis not present

## 2020-11-01 DIAGNOSIS — R2681 Unsteadiness on feet: Secondary | ICD-10-CM | POA: Diagnosis not present

## 2020-11-01 DIAGNOSIS — M6281 Muscle weakness (generalized): Secondary | ICD-10-CM | POA: Diagnosis not present

## 2020-11-01 DIAGNOSIS — R4189 Other symptoms and signs involving cognitive functions and awareness: Secondary | ICD-10-CM | POA: Diagnosis not present

## 2020-11-01 DIAGNOSIS — M170 Bilateral primary osteoarthritis of knee: Secondary | ICD-10-CM | POA: Diagnosis not present

## 2020-11-01 DIAGNOSIS — M545 Low back pain, unspecified: Secondary | ICD-10-CM | POA: Diagnosis not present

## 2020-11-03 DIAGNOSIS — I499 Cardiac arrhythmia, unspecified: Secondary | ICD-10-CM | POA: Diagnosis not present

## 2020-12-05 DIAGNOSIS — F01C18 Vascular dementia, severe, with other behavioral disturbance: Secondary | ICD-10-CM | POA: Diagnosis not present

## 2020-12-05 DIAGNOSIS — G609 Hereditary and idiopathic neuropathy, unspecified: Secondary | ICD-10-CM | POA: Diagnosis not present

## 2020-12-05 DIAGNOSIS — I10 Essential (primary) hypertension: Secondary | ICD-10-CM | POA: Diagnosis not present

## 2020-12-05 DIAGNOSIS — K219 Gastro-esophageal reflux disease without esophagitis: Secondary | ICD-10-CM | POA: Diagnosis not present

## 2020-12-05 DIAGNOSIS — I499 Cardiac arrhythmia, unspecified: Secondary | ICD-10-CM | POA: Diagnosis not present

## 2020-12-05 DIAGNOSIS — F39 Unspecified mood [affective] disorder: Secondary | ICD-10-CM | POA: Diagnosis not present

## 2020-12-08 DIAGNOSIS — I10 Essential (primary) hypertension: Secondary | ICD-10-CM | POA: Diagnosis not present

## 2020-12-08 LAB — BASIC METABOLIC PANEL
BUN: 29 — AB (ref 4–21)
CO2: 25 — AB (ref 13–22)
Chloride: 101 (ref 99–108)
Creatinine: 1 (ref 0.5–1.1)
Glucose: 94
Potassium: 4.3 mEq/L (ref 3.5–5.1)
Sodium: 135 — AB (ref 137–147)

## 2020-12-08 LAB — HEPATIC FUNCTION PANEL
ALT: 27 U/L (ref 7–35)
AST: 25 (ref 13–35)
Alkaline Phosphatase: 98 (ref 25–125)
Bilirubin, Total: 0.3

## 2020-12-08 LAB — CBC: RBC: 3.41 — AB (ref 3.87–5.11)

## 2020-12-08 LAB — LIPID PANEL
Cholesterol: 115 (ref 0–200)
HDL: 39 (ref 35–70)
LDL Cholesterol: 57
Triglycerides: 102 (ref 40–160)

## 2020-12-08 LAB — COMPREHENSIVE METABOLIC PANEL
Albumin: 3.6 (ref 3.5–5.0)
Calcium: 9 (ref 8.7–10.7)
Globulin: 2.5
eGFR: 58

## 2020-12-08 LAB — CBC AND DIFFERENTIAL
HCT: 32 — AB (ref 36–46)
Hemoglobin: 10.6 — AB (ref 12.0–16.0)
Neutrophils Absolute: 4905
Platelets: 435 10*3/uL — AB (ref 150–400)
WBC: 7.7

## 2020-12-24 DIAGNOSIS — M79661 Pain in right lower leg: Secondary | ICD-10-CM | POA: Diagnosis not present

## 2020-12-24 DIAGNOSIS — M79662 Pain in left lower leg: Secondary | ICD-10-CM | POA: Diagnosis not present

## 2021-01-05 DIAGNOSIS — R4701 Aphasia: Secondary | ICD-10-CM | POA: Diagnosis not present

## 2021-01-05 DIAGNOSIS — I69920 Aphasia following unspecified cerebrovascular disease: Secondary | ICD-10-CM

## 2021-01-05 DIAGNOSIS — F39 Unspecified mood [affective] disorder: Secondary | ICD-10-CM | POA: Diagnosis not present

## 2021-01-05 DIAGNOSIS — I639 Cerebral infarction, unspecified: Secondary | ICD-10-CM | POA: Diagnosis not present

## 2021-01-05 DIAGNOSIS — G301 Alzheimer's disease with late onset: Secondary | ICD-10-CM

## 2021-01-05 DIAGNOSIS — N1831 Chronic kidney disease, stage 3a: Secondary | ICD-10-CM

## 2021-01-05 DIAGNOSIS — I10 Essential (primary) hypertension: Secondary | ICD-10-CM

## 2021-01-05 DIAGNOSIS — F0150 Vascular dementia without behavioral disturbance: Secondary | ICD-10-CM

## 2021-01-05 DIAGNOSIS — R4189 Other symptoms and signs involving cognitive functions and awareness: Secondary | ICD-10-CM | POA: Diagnosis not present

## 2021-01-05 DIAGNOSIS — I69819 Unspecified symptoms and signs involving cognitive functions following other cerebrovascular disease: Secondary | ICD-10-CM

## 2021-01-05 DIAGNOSIS — Z741 Need for assistance with personal care: Secondary | ICD-10-CM | POA: Diagnosis not present

## 2021-01-05 DIAGNOSIS — F0280 Dementia in other diseases classified elsewhere without behavioral disturbance: Secondary | ICD-10-CM | POA: Diagnosis not present

## 2021-01-05 DIAGNOSIS — R278 Other lack of coordination: Secondary | ICD-10-CM | POA: Diagnosis not present

## 2021-01-15 DIAGNOSIS — Z9989 Dependence on other enabling machines and devices: Secondary | ICD-10-CM | POA: Diagnosis not present

## 2021-01-15 DIAGNOSIS — R001 Bradycardia, unspecified: Secondary | ICD-10-CM | POA: Diagnosis not present

## 2021-01-15 DIAGNOSIS — I493 Ventricular premature depolarization: Secondary | ICD-10-CM | POA: Diagnosis not present

## 2021-01-15 DIAGNOSIS — I341 Nonrheumatic mitral (valve) prolapse: Secondary | ICD-10-CM | POA: Diagnosis not present

## 2021-01-15 DIAGNOSIS — Z23 Encounter for immunization: Secondary | ICD-10-CM | POA: Diagnosis not present

## 2021-01-15 DIAGNOSIS — G4733 Obstructive sleep apnea (adult) (pediatric): Secondary | ICD-10-CM | POA: Diagnosis not present

## 2021-01-15 DIAGNOSIS — I340 Nonrheumatic mitral (valve) insufficiency: Secondary | ICD-10-CM | POA: Diagnosis not present

## 2021-01-15 DIAGNOSIS — I10 Essential (primary) hypertension: Secondary | ICD-10-CM | POA: Diagnosis not present

## 2021-02-24 DIAGNOSIS — F3340 Major depressive disorder, recurrent, in remission, unspecified: Secondary | ICD-10-CM | POA: Diagnosis not present

## 2021-02-24 DIAGNOSIS — I10 Essential (primary) hypertension: Secondary | ICD-10-CM | POA: Diagnosis not present

## 2021-02-24 DIAGNOSIS — Z6838 Body mass index (BMI) 38.0-38.9, adult: Secondary | ICD-10-CM | POA: Diagnosis not present

## 2021-02-24 DIAGNOSIS — M170 Bilateral primary osteoarthritis of knee: Secondary | ICD-10-CM | POA: Diagnosis not present

## 2021-02-24 DIAGNOSIS — G4733 Obstructive sleep apnea (adult) (pediatric): Secondary | ICD-10-CM | POA: Diagnosis not present

## 2021-02-24 DIAGNOSIS — F0390 Unspecified dementia without behavioral disturbance: Secondary | ICD-10-CM | POA: Diagnosis not present

## 2021-02-24 DIAGNOSIS — Z0000 Encounter for general adult medical examination without abnormal findings: Secondary | ICD-10-CM | POA: Diagnosis not present

## 2021-02-24 DIAGNOSIS — D649 Anemia, unspecified: Secondary | ICD-10-CM | POA: Diagnosis not present

## 2021-03-04 DIAGNOSIS — I340 Nonrheumatic mitral (valve) insufficiency: Secondary | ICD-10-CM | POA: Diagnosis not present

## 2021-03-04 DIAGNOSIS — F0390 Unspecified dementia without behavioral disturbance: Secondary | ICD-10-CM | POA: Diagnosis not present

## 2021-03-04 DIAGNOSIS — I493 Ventricular premature depolarization: Secondary | ICD-10-CM | POA: Diagnosis not present

## 2021-03-04 DIAGNOSIS — I341 Nonrheumatic mitral (valve) prolapse: Secondary | ICD-10-CM | POA: Diagnosis not present

## 2021-03-04 DIAGNOSIS — G4733 Obstructive sleep apnea (adult) (pediatric): Secondary | ICD-10-CM | POA: Diagnosis not present

## 2021-03-04 DIAGNOSIS — Z9989 Dependence on other enabling machines and devices: Secondary | ICD-10-CM | POA: Diagnosis not present

## 2021-03-04 DIAGNOSIS — I10 Essential (primary) hypertension: Secondary | ICD-10-CM | POA: Diagnosis not present

## 2021-03-04 DIAGNOSIS — Z23 Encounter for immunization: Secondary | ICD-10-CM | POA: Diagnosis not present

## 2021-03-11 DIAGNOSIS — I499 Cardiac arrhythmia, unspecified: Secondary | ICD-10-CM | POA: Diagnosis not present

## 2021-03-11 DIAGNOSIS — I10 Essential (primary) hypertension: Secondary | ICD-10-CM | POA: Diagnosis not present

## 2021-03-11 DIAGNOSIS — N183 Chronic kidney disease, stage 3 unspecified: Secondary | ICD-10-CM | POA: Diagnosis not present

## 2021-03-11 DIAGNOSIS — I69359 Hemiplegia and hemiparesis following cerebral infarction affecting unspecified side: Secondary | ICD-10-CM | POA: Diagnosis not present

## 2021-03-11 DIAGNOSIS — K219 Gastro-esophageal reflux disease without esophagitis: Secondary | ICD-10-CM | POA: Diagnosis not present

## 2021-03-11 DIAGNOSIS — G609 Hereditary and idiopathic neuropathy, unspecified: Secondary | ICD-10-CM | POA: Diagnosis not present

## 2021-03-11 DIAGNOSIS — F39 Unspecified mood [affective] disorder: Secondary | ICD-10-CM | POA: Diagnosis not present

## 2021-05-12 DIAGNOSIS — I69915 Cognitive social or emotional deficit following unspecified cerebrovascular disease: Secondary | ICD-10-CM | POA: Diagnosis not present

## 2021-05-12 DIAGNOSIS — I10 Essential (primary) hypertension: Secondary | ICD-10-CM | POA: Diagnosis not present

## 2021-05-12 DIAGNOSIS — G301 Alzheimer's disease with late onset: Secondary | ICD-10-CM | POA: Diagnosis not present

## 2021-05-12 DIAGNOSIS — F39 Unspecified mood [affective] disorder: Secondary | ICD-10-CM | POA: Diagnosis not present

## 2021-05-12 DIAGNOSIS — K219 Gastro-esophageal reflux disease without esophagitis: Secondary | ICD-10-CM | POA: Diagnosis not present

## 2021-05-12 DIAGNOSIS — N1831 Chronic kidney disease, stage 3a: Secondary | ICD-10-CM | POA: Diagnosis not present

## 2021-05-12 DIAGNOSIS — F0150 Vascular dementia without behavioral disturbance: Secondary | ICD-10-CM | POA: Diagnosis not present

## 2021-05-12 DIAGNOSIS — I69320 Aphasia following cerebral infarction: Secondary | ICD-10-CM | POA: Diagnosis not present

## 2021-07-10 DIAGNOSIS — I10 Essential (primary) hypertension: Secondary | ICD-10-CM | POA: Diagnosis not present

## 2021-07-10 DIAGNOSIS — K219 Gastro-esophageal reflux disease without esophagitis: Secondary | ICD-10-CM | POA: Diagnosis not present

## 2021-07-10 DIAGNOSIS — F39 Unspecified mood [affective] disorder: Secondary | ICD-10-CM | POA: Diagnosis not present

## 2021-07-10 DIAGNOSIS — G629 Polyneuropathy, unspecified: Secondary | ICD-10-CM | POA: Diagnosis not present

## 2021-07-10 DIAGNOSIS — N183 Chronic kidney disease, stage 3 unspecified: Secondary | ICD-10-CM | POA: Diagnosis not present

## 2021-07-10 DIAGNOSIS — F1120 Opioid dependence, uncomplicated: Secondary | ICD-10-CM | POA: Diagnosis not present

## 2021-07-15 DIAGNOSIS — B351 Tinea unguium: Secondary | ICD-10-CM | POA: Diagnosis not present

## 2021-08-18 ENCOUNTER — Encounter: Payer: Self-pay | Admitting: Emergency Medicine

## 2021-08-18 ENCOUNTER — Emergency Department: Payer: PPO

## 2021-08-18 ENCOUNTER — Inpatient Hospital Stay
Admission: EM | Admit: 2021-08-18 | Discharge: 2021-08-23 | DRG: 956 | Disposition: A | Discharge status: Skilled Nursing Facility | Payer: PPO | Source: Skilled Nursing Facility | Attending: Obstetrics and Gynecology | Admitting: Obstetrics and Gynecology

## 2021-08-18 ENCOUNTER — Other Ambulatory Visit: Payer: Self-pay

## 2021-08-18 DIAGNOSIS — F03A3 Unspecified dementia, mild, with mood disturbance: Secondary | ICD-10-CM | POA: Diagnosis not present

## 2021-08-18 DIAGNOSIS — Z043 Encounter for examination and observation following other accident: Secondary | ICD-10-CM | POA: Diagnosis not present

## 2021-08-18 DIAGNOSIS — Z880 Allergy status to penicillin: Secondary | ICD-10-CM

## 2021-08-18 DIAGNOSIS — S52602A Unspecified fracture of lower end of left ulna, initial encounter for closed fracture: Secondary | ICD-10-CM | POA: Diagnosis present

## 2021-08-18 DIAGNOSIS — S32512A Fracture of superior rim of left pubis, initial encounter for closed fracture: Secondary | ICD-10-CM | POA: Diagnosis not present

## 2021-08-18 DIAGNOSIS — F05 Delirium due to known physiological condition: Secondary | ICD-10-CM | POA: Diagnosis present

## 2021-08-18 DIAGNOSIS — Z853 Personal history of malignant neoplasm of breast: Secondary | ICD-10-CM | POA: Diagnosis not present

## 2021-08-18 DIAGNOSIS — D72829 Elevated white blood cell count, unspecified: Secondary | ICD-10-CM | POA: Diagnosis present

## 2021-08-18 DIAGNOSIS — I10 Essential (primary) hypertension: Secondary | ICD-10-CM | POA: Diagnosis not present

## 2021-08-18 DIAGNOSIS — S72002A Fracture of unspecified part of neck of left femur, initial encounter for closed fracture: Secondary | ICD-10-CM

## 2021-08-18 DIAGNOSIS — F0390 Unspecified dementia without behavioral disturbance: Secondary | ICD-10-CM | POA: Diagnosis present

## 2021-08-18 DIAGNOSIS — M8588 Other specified disorders of bone density and structure, other site: Secondary | ICD-10-CM | POA: Diagnosis not present

## 2021-08-18 DIAGNOSIS — I480 Paroxysmal atrial fibrillation: Secondary | ICD-10-CM | POA: Diagnosis not present

## 2021-08-18 DIAGNOSIS — R4182 Altered mental status, unspecified: Secondary | ICD-10-CM | POA: Diagnosis not present

## 2021-08-18 DIAGNOSIS — Z66 Do not resuscitate: Secondary | ICD-10-CM | POA: Diagnosis not present

## 2021-08-18 DIAGNOSIS — G319 Degenerative disease of nervous system, unspecified: Secondary | ICD-10-CM | POA: Diagnosis not present

## 2021-08-18 DIAGNOSIS — R339 Retention of urine, unspecified: Secondary | ICD-10-CM | POA: Diagnosis present

## 2021-08-18 DIAGNOSIS — I129 Hypertensive chronic kidney disease with stage 1 through stage 4 chronic kidney disease, or unspecified chronic kidney disease: Secondary | ICD-10-CM | POA: Diagnosis present

## 2021-08-18 DIAGNOSIS — M4856XA Collapsed vertebra, not elsewhere classified, lumbar region, initial encounter for fracture: Secondary | ICD-10-CM | POA: Diagnosis not present

## 2021-08-18 DIAGNOSIS — S32010A Wedge compression fracture of first lumbar vertebra, initial encounter for closed fracture: Secondary | ICD-10-CM

## 2021-08-18 DIAGNOSIS — M25522 Pain in left elbow: Secondary | ICD-10-CM | POA: Diagnosis not present

## 2021-08-18 DIAGNOSIS — E669 Obesity, unspecified: Secondary | ICD-10-CM | POA: Diagnosis present

## 2021-08-18 DIAGNOSIS — I639 Cerebral infarction, unspecified: Secondary | ICD-10-CM | POA: Diagnosis not present

## 2021-08-18 DIAGNOSIS — S52502A Unspecified fracture of the lower end of left radius, initial encounter for closed fracture: Secondary | ICD-10-CM | POA: Diagnosis not present

## 2021-08-18 DIAGNOSIS — M25512 Pain in left shoulder: Secondary | ICD-10-CM | POA: Diagnosis not present

## 2021-08-18 DIAGNOSIS — I340 Nonrheumatic mitral (valve) insufficiency: Secondary | ICD-10-CM | POA: Diagnosis present

## 2021-08-18 DIAGNOSIS — Z7902 Long term (current) use of antithrombotics/antiplatelets: Secondary | ICD-10-CM

## 2021-08-18 DIAGNOSIS — Z8673 Personal history of transient ischemic attack (TIA), and cerebral infarction without residual deficits: Secondary | ICD-10-CM | POA: Diagnosis not present

## 2021-08-18 DIAGNOSIS — Z9989 Dependence on other enabling machines and devices: Secondary | ICD-10-CM

## 2021-08-18 DIAGNOSIS — Z6834 Body mass index (BMI) 34.0-34.9, adult: Secondary | ICD-10-CM

## 2021-08-18 DIAGNOSIS — F32A Depression, unspecified: Secondary | ICD-10-CM | POA: Diagnosis present

## 2021-08-18 DIAGNOSIS — M545 Low back pain, unspecified: Secondary | ICD-10-CM | POA: Diagnosis not present

## 2021-08-18 DIAGNOSIS — Z9011 Acquired absence of right breast and nipple: Secondary | ICD-10-CM | POA: Diagnosis not present

## 2021-08-18 DIAGNOSIS — J341 Cyst and mucocele of nose and nasal sinus: Secondary | ICD-10-CM | POA: Diagnosis not present

## 2021-08-18 DIAGNOSIS — Z923 Personal history of irradiation: Secondary | ICD-10-CM

## 2021-08-18 DIAGNOSIS — S199XXA Unspecified injury of neck, initial encounter: Secondary | ICD-10-CM | POA: Diagnosis not present

## 2021-08-18 DIAGNOSIS — Z471 Aftercare following joint replacement surgery: Secondary | ICD-10-CM | POA: Diagnosis not present

## 2021-08-18 DIAGNOSIS — Z79899 Other long term (current) drug therapy: Secondary | ICD-10-CM

## 2021-08-18 DIAGNOSIS — Z96651 Presence of right artificial knee joint: Secondary | ICD-10-CM | POA: Diagnosis present

## 2021-08-18 DIAGNOSIS — M546 Pain in thoracic spine: Secondary | ICD-10-CM | POA: Diagnosis not present

## 2021-08-18 DIAGNOSIS — E785 Hyperlipidemia, unspecified: Secondary | ICD-10-CM | POA: Diagnosis present

## 2021-08-18 DIAGNOSIS — R600 Localized edema: Secondary | ICD-10-CM | POA: Diagnosis not present

## 2021-08-18 DIAGNOSIS — Z7401 Bed confinement status: Secondary | ICD-10-CM | POA: Diagnosis not present

## 2021-08-18 DIAGNOSIS — S52532A Colles' fracture of left radius, initial encounter for closed fracture: Secondary | ICD-10-CM | POA: Diagnosis not present

## 2021-08-18 DIAGNOSIS — F0392 Unspecified dementia, unspecified severity, with psychotic disturbance: Secondary | ICD-10-CM | POA: Diagnosis present

## 2021-08-18 DIAGNOSIS — Z87891 Personal history of nicotine dependence: Secondary | ICD-10-CM

## 2021-08-18 DIAGNOSIS — D509 Iron deficiency anemia, unspecified: Secondary | ICD-10-CM | POA: Diagnosis not present

## 2021-08-18 DIAGNOSIS — S0990XA Unspecified injury of head, initial encounter: Secondary | ICD-10-CM | POA: Diagnosis not present

## 2021-08-18 DIAGNOSIS — S52125A Nondisplaced fracture of head of left radius, initial encounter for closed fracture: Secondary | ICD-10-CM | POA: Diagnosis not present

## 2021-08-18 DIAGNOSIS — Z888 Allergy status to other drugs, medicaments and biological substances status: Secondary | ICD-10-CM

## 2021-08-18 DIAGNOSIS — I2721 Secondary pulmonary arterial hypertension: Secondary | ICD-10-CM | POA: Diagnosis not present

## 2021-08-18 DIAGNOSIS — G4733 Obstructive sleep apnea (adult) (pediatric): Secondary | ICD-10-CM

## 2021-08-18 DIAGNOSIS — M25562 Pain in left knee: Secondary | ICD-10-CM | POA: Diagnosis not present

## 2021-08-18 DIAGNOSIS — K219 Gastro-esophageal reflux disease without esophagitis: Secondary | ICD-10-CM | POA: Diagnosis present

## 2021-08-18 DIAGNOSIS — S62102A Fracture of unspecified carpal bone, left wrist, initial encounter for closed fracture: Secondary | ICD-10-CM | POA: Diagnosis present

## 2021-08-18 DIAGNOSIS — S42402A Unspecified fracture of lower end of left humerus, initial encounter for closed fracture: Secondary | ICD-10-CM

## 2021-08-18 DIAGNOSIS — S32592A Other specified fracture of left pubis, initial encounter for closed fracture: Secondary | ICD-10-CM | POA: Diagnosis not present

## 2021-08-18 DIAGNOSIS — I517 Cardiomegaly: Secondary | ICD-10-CM | POA: Diagnosis not present

## 2021-08-18 DIAGNOSIS — W1830XA Fall on same level, unspecified, initial encounter: Secondary | ICD-10-CM | POA: Diagnosis present

## 2021-08-18 DIAGNOSIS — R7303 Prediabetes: Secondary | ICD-10-CM | POA: Diagnosis present

## 2021-08-18 DIAGNOSIS — W19XXXA Unspecified fall, initial encounter: Secondary | ICD-10-CM | POA: Diagnosis present

## 2021-08-18 DIAGNOSIS — S52132A Displaced fracture of neck of left radius, initial encounter for closed fracture: Secondary | ICD-10-CM

## 2021-08-18 DIAGNOSIS — Z96642 Presence of left artificial hip joint: Secondary | ICD-10-CM | POA: Diagnosis not present

## 2021-08-18 DIAGNOSIS — N1831 Chronic kidney disease, stage 3a: Secondary | ICD-10-CM | POA: Diagnosis not present

## 2021-08-18 DIAGNOSIS — R5381 Other malaise: Secondary | ICD-10-CM | POA: Diagnosis not present

## 2021-08-18 DIAGNOSIS — N390 Urinary tract infection, site not specified: Secondary | ICD-10-CM | POA: Diagnosis not present

## 2021-08-18 DIAGNOSIS — S52692A Other fracture of lower end of left ulna, initial encounter for closed fracture: Secondary | ICD-10-CM | POA: Diagnosis not present

## 2021-08-18 DIAGNOSIS — S72032A Displaced midcervical fracture of left femur, initial encounter for closed fracture: Secondary | ICD-10-CM | POA: Diagnosis not present

## 2021-08-18 DIAGNOSIS — S52122A Displaced fracture of head of left radius, initial encounter for closed fracture: Secondary | ICD-10-CM | POA: Diagnosis not present

## 2021-08-18 HISTORY — DX: Wedge compression fracture of first lumbar vertebra, initial encounter for closed fracture: S32.010A

## 2021-08-18 HISTORY — DX: Displaced fracture of neck of left radius, initial encounter for closed fracture: S52.132A

## 2021-08-18 HISTORY — DX: Fracture of unspecified part of neck of left femur, initial encounter for closed fracture: S72.002A

## 2021-08-18 LAB — COMPREHENSIVE METABOLIC PANEL
ALT: 28 U/L (ref 0–44)
AST: 32 U/L (ref 15–41)
Albumin: 3.4 g/dL — ABNORMAL LOW (ref 3.5–5.0)
Alkaline Phosphatase: 115 U/L (ref 38–126)
Anion gap: 9 (ref 5–15)
BUN: 32 mg/dL — ABNORMAL HIGH (ref 8–23)
CO2: 22 mmol/L (ref 22–32)
Calcium: 9.2 mg/dL (ref 8.9–10.3)
Chloride: 107 mmol/L (ref 98–111)
Creatinine, Ser: 1.14 mg/dL — ABNORMAL HIGH (ref 0.44–1.00)
GFR, Estimated: 48 mL/min — ABNORMAL LOW (ref >60)
Glucose, Bld: 172 mg/dL — ABNORMAL HIGH (ref 70–99)
Potassium: 4 mmol/L (ref 3.5–5.1)
Sodium: 138 mmol/L (ref 135–145)
Total Bilirubin: 0.7 mg/dL (ref 0.3–1.2)
Total Protein: 6.5 g/dL (ref 6.5–8.1)

## 2021-08-18 LAB — CBC WITH DIFFERENTIAL/PLATELET
Abs Immature Granulocytes: 0.29 10*3/uL — ABNORMAL HIGH (ref 0.00–0.07)
Basophils Absolute: 0.1 10*3/uL (ref 0.0–0.1)
Basophils Relative: 1 %
Eosinophils Absolute: 0.2 10*3/uL (ref 0.0–0.5)
Eosinophils Relative: 1 %
HCT: 35.4 % — ABNORMAL LOW (ref 36.0–46.0)
Hemoglobin: 11.6 g/dL — ABNORMAL LOW (ref 12.0–15.0)
Immature Granulocytes: 1 %
Lymphocytes Relative: 7 %
Lymphs Abs: 1.7 10*3/uL (ref 0.7–4.0)
MCH: 29.3 pg (ref 26.0–34.0)
MCHC: 32.8 g/dL (ref 30.0–36.0)
MCV: 89.4 fL (ref 80.0–100.0)
Monocytes Absolute: 1 10*3/uL (ref 0.1–1.0)
Monocytes Relative: 4 %
Neutro Abs: 20.4 10*3/uL — ABNORMAL HIGH (ref 1.7–7.7)
Neutrophils Relative %: 86 %
Platelets: 429 10*3/uL — ABNORMAL HIGH (ref 150–400)
RBC: 3.96 MIL/uL (ref 3.87–5.11)
RDW: 14.1 % (ref 11.5–15.5)
WBC: 23.7 10*3/uL — ABNORMAL HIGH (ref 4.0–10.5)
nRBC: 0 % (ref 0.0–0.2)

## 2021-08-18 LAB — URINALYSIS, COMPLETE (UACMP) WITH MICROSCOPIC
Bilirubin Urine: NEGATIVE
Glucose, UA: NEGATIVE mg/dL
Hgb urine dipstick: NEGATIVE
Ketones, ur: 5 mg/dL — AB
Leukocytes,Ua: NEGATIVE
Nitrite: NEGATIVE
Protein, ur: 30 mg/dL — AB
Specific Gravity, Urine: 1.026 (ref 1.005–1.030)
pH: 5 (ref 5.0–8.0)

## 2021-08-18 LAB — PROTIME-INR
INR: 1.1 (ref 0.8–1.2)
Prothrombin Time: 13.7 seconds (ref 11.4–15.2)

## 2021-08-18 LAB — TYPE AND SCREEN
ABO/RH(D): A POS
Antibody Screen: NEGATIVE

## 2021-08-18 LAB — APTT: aPTT: 29 seconds (ref 24–36)

## 2021-08-18 LAB — GLUCOSE, CAPILLARY: Glucose-Capillary: 130 mg/dL — ABNORMAL HIGH (ref 70–99)

## 2021-08-18 MED ORDER — BENEFIBER PO CHEW: 1.0000 | CHEWABLE_TABLET | Freq: Every day | ORAL | Status: DC

## 2021-08-18 MED ORDER — ACETAMINOPHEN 10 MG/ML IV SOLN
1000.0000 mg | INTRAVENOUS | Status: AC
Start: 2021-08-18 — End: 2021-08-18
  Administered 2021-08-18: 1000 mg via INTRAVENOUS
  Filled 2021-08-18: qty 100

## 2021-08-18 MED ORDER — METHOCARBAMOL 500 MG PO TABS: 500.0000 mg | ORAL_TABLET | Freq: Three times a day (TID) | ORAL | Status: DC | PRN

## 2021-08-18 MED ORDER — LACTASE 3000 UNITS PO TABS
1.0000 | ORAL_TABLET | Freq: Every day | ORAL | Status: DC
  Administered 2021-08-19 – 2021-08-23 (×4): 3000 [IU] via ORAL
  Filled 2021-08-18 (×6): qty 1

## 2021-08-18 MED ORDER — FAMOTIDINE 20 MG PO TABS
20.0000 mg | ORAL_TABLET | Freq: Every day | ORAL | Status: DC
  Administered 2021-08-18 – 2021-08-23 (×5): 20 mg via ORAL
  Filled 2021-08-18 (×5): qty 1

## 2021-08-18 MED ORDER — HYDRALAZINE HCL 20 MG/ML IJ SOLN
5.0000 mg | INTRAMUSCULAR | Status: DC | PRN
Start: 2021-08-18 — End: 2021-08-19

## 2021-08-18 MED ORDER — ATORVASTATIN CALCIUM 20 MG PO TABS
80.0000 mg | ORAL_TABLET | Freq: Every day | ORAL | Status: DC
  Administered 2021-08-18 – 2021-08-23 (×5): 80 mg via ORAL
  Filled 2021-08-18 (×5): qty 4

## 2021-08-18 MED ORDER — OXYCODONE-ACETAMINOPHEN 5-325 MG PO TABS: 1.0000 | ORAL_TABLET | ORAL | Status: DC | PRN

## 2021-08-18 MED ORDER — ADULT MULTIVITAMIN W/MINERALS CH
1.0000 | ORAL_TABLET | Freq: Two times a day (BID) | ORAL | Status: DC
  Administered 2021-08-18 – 2021-08-23 (×10): 1 via ORAL
  Filled 2021-08-18 (×11): qty 1

## 2021-08-18 MED ORDER — FENTANYL CITRATE PF 50 MCG/ML IJ SOSY
50.0000 ug | PREFILLED_SYRINGE | Freq: Once | INTRAMUSCULAR | Status: AC
  Administered 2021-08-18: 50 ug via INTRAVENOUS
  Filled 2021-08-18: qty 1

## 2021-08-18 MED ORDER — MORPHINE SULFATE (PF) 2 MG/ML IV SOLN
1.0000 mg | INTRAVENOUS | Status: DC | PRN
  Administered 2021-08-19: 1 mg via INTRAVENOUS
  Filled 2021-08-18: qty 1

## 2021-08-18 MED ORDER — SODIUM CHLORIDE 0.9 % IV SOLN: INTRAVENOUS | Status: DC

## 2021-08-18 MED ORDER — FELODIPINE ER 5 MG PO TB24
5.0000 mg | ORAL_TABLET | Freq: Every day | ORAL | Status: DC
  Filled 2021-08-18 (×2): qty 1

## 2021-08-18 MED ORDER — CITALOPRAM HYDROBROMIDE 10 MG PO TABS
10.0000 mg | ORAL_TABLET | ORAL | Status: DC
  Administered 2021-08-18: 10 mg via ORAL
  Filled 2021-08-18: qty 1

## 2021-08-18 MED ORDER — GABAPENTIN 100 MG PO CAPS
200.0000 mg | ORAL_CAPSULE | Freq: Every day | ORAL | Status: DC
  Administered 2021-08-18 – 2021-08-20 (×3): 200 mg via ORAL
  Filled 2021-08-18 (×3): qty 2

## 2021-08-18 MED ORDER — SODIUM CHLORIDE 0.9 % IV SOLN: INTRAVENOUS | Status: AC

## 2021-08-18 MED ORDER — ACETAMINOPHEN 325 MG PO TABS
650.0000 mg | ORAL_TABLET | Freq: Four times a day (QID) | ORAL | Status: DC | PRN
  Administered 2021-08-23: 650 mg via ORAL
  Filled 2021-08-18: qty 2

## 2021-08-18 MED ORDER — LIDOCAINE 5 % EX PTCH
1.0000 | MEDICATED_PATCH | CUTANEOUS | Status: DC
Start: 2021-08-18 — End: 2021-08-23
  Administered 2021-08-18 – 2021-08-23 (×6): 1 via TRANSDERMAL
  Filled 2021-08-18 (×6): qty 1

## 2021-08-18 MED ORDER — VANCOMYCIN HCL IN DEXTROSE 1-5 GM/200ML-% IV SOLN
1000.0000 mg | INTRAVENOUS | Status: AC
  Administered 2021-08-20: 1000 mg via INTRAVENOUS
  Filled 2021-08-18: qty 200

## 2021-08-18 MED ORDER — POLYETHYLENE GLYCOL 3350 17 G PO PACK
17.0000 g | PACK | Freq: Every day | ORAL | Status: DC
  Administered 2021-08-18 – 2021-08-23 (×5): 17 g via ORAL
  Filled 2021-08-18 (×5): qty 1

## 2021-08-18 MED ORDER — DONEPEZIL HCL 5 MG PO TABS
10.0000 mg | ORAL_TABLET | Freq: Every day | ORAL | Status: DC
  Administered 2021-08-18 – 2021-08-22 (×5): 10 mg via ORAL
  Filled 2021-08-18 (×5): qty 2

## 2021-08-18 MED ORDER — SENNOSIDES-DOCUSATE SODIUM 8.6-50 MG PO TABS: 1.0000 | ORAL_TABLET | Freq: Every evening | ORAL | Status: DC | PRN

## 2021-08-18 MED ORDER — ONDANSETRON HCL 4 MG/2ML IJ SOLN: 4.0000 mg | Freq: Three times a day (TID) | INTRAMUSCULAR | Status: DC | PRN

## 2021-08-18 NOTE — ED Provider Notes (Signed)
[facility] Provider Note    Event Date/Time   First MD Initiated Contact with Patient 08/18/21 1028     (approximate)   History   Fall   HPI  Courtney Lynn is a 81 y.o. female past medical history of anemia, arthritis, GERD, HTN, hyponatremia, mitral valve prolapse, pulmonary artery hypertension, OSA, breast cancer status postlumpectomy and radiation, CVA, paroxysmal A-fib and dementia not anticoagulated who presents for evaluation after witnessed fall.  Patient was reportedly ambulating with a walker when she reached up to scratch herself and she fell from a standing position striking the back of her head and her left hip and left arm.  She has seemed to lose balance while this was going on.  On arrival patient is not oriented and other than endorsing some poorly localized pain and is unable to provide any additional history.  Seems she is disoriented at baseline and no other history is immediately available on presentation.  Past Surgical History:  Procedure Laterality Date   APPENDECTOMY     BREAST BIOPSY Right 01/02/2016   PARTIAL MASTECTOMY- [facility] and DCIS   BREAST LUMPECTOMY Right 2017   CATARACT EXTRACTION W/PHACO Right 09/14/2016   Procedure: CATARACT EXTRACTION PHACO AND INTRAOCULAR LENS PLACEMENT (IOC);  Surgeon: Birder Robson, MD;  Location: [facility] ORS;  Service: Ophthalmology;  Laterality: Right;  [place] 00:32 AP% 19.5 CDE 6.28 Fluid pack lot # [id number]   CATARACT EXTRACTION W/PHACO Left 10/05/2016   Procedure: CATARACT EXTRACTION PHACO AND INTRAOCULAR LENS PLACEMENT (IOC);  Surgeon: Birder Robson, MD;  Location: [facility] ORS;  Service: Ophthalmology;  Laterality: Left;  [place] 00:46.9 AP% 16.0 CDE 7.48 Fluid Pack lot # [id number]   CHOLECYSTECTOMY     COLONOSCOPY WITH PROPOFOL N/A 08/24/2016   Procedure: COLONOSCOPY WITH PROPOFOL;  Surgeon: Lollie Sails, MD;  Location: [facility] ENDOSCOPY;  Service: Endoscopy;  Laterality: N/A;   COLONOSCOPY WITH  PROPOFOL N/A 07/12/2018   Procedure: COLONOSCOPY WITH PROPOFOL;  Surgeon: [place], Benay Pike, MD;  Location: [facility] ENDOSCOPY;  Service: Gastroenterology;  Laterality: N/A;   dental implants     FOOT SURGERY Right    FRACTURE SURGERY     KNEE CAP   HEMORRHOID SURGERY N/A 04/19/2019   Procedure: HEMORRHOIDECTOMY;  Surgeon: Benjamine Sprague, DO;  Location: [facility] ORS;  Service: General;  Laterality: N/A;   JOINT REPLACEMENT Right 2011   KNEE   PARTIAL MASTECTOMY WITH NEEDLE LOCALIZATION Right 01/02/2016   Procedure: PARTIAL MASTECTOMY WITH NEEDLE LOCALIZATION;  Surgeon: Leonie Green, MD;  Location: [facility] ORS;  Service: General;  Laterality: Right;   PATELLA FRACTURE SURGERY     REPLACEMENT TOTAL KNEE Right    SENTINEL NODE BIOPSY Right 01/02/2016   Procedure: SENTINEL NODE BIOPSY;  Surgeon: Leonie Green, MD;  Location: [facility] ORS;  Service: General;  Laterality: Right;   TONSILLECTOMY        Past Medical History:  Diagnosis Date   Anemia    Arthritis    Body mass index (bmi) 37.0-37.9, adult    Breast cancer ([place]) 01/02/2016   right breast/radiation   Cancer ([place]) 2017   BREAST   Chronic insomnia    Complication of anesthesia    has difficulty last surgery intubating   Depression    Difficult intubation    WITH TKR   Dizziness    Dysrhythmia    PVC'S,BRADYCARDIA   GERD (gastroesophageal reflux disease)    History of chicken pox    Hypertension    Hyponatremia  Intermittent vertigo    Irregular heart beat    Memory difficulties    Mild mitral regurgitation    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HCC)    MVP (mitral valve prolapse)    Orthopnea    Personal history of radiation therapy 2017   Pre-diabetes    PVC's (premature ventricular contractions)    Reflux    Sleep apnea    CPAP     Physical Exam  Triage Vital Signs: ED Triage Vitals  Enc Vitals Group     BP      Pulse      Resp      Temp      Temp src      SpO2      Weight      Height      Head  Circumference      Peak Flow      Pain Score      Pain Loc      Pain Edu?      Excl. in [place]?     Most recent vital signs: Vitals:   08/18/21 1045 08/18/21 1230  BP: (!) 143/87 139/88  Pulse: 78 82  Resp: 18 16  Temp: 98 F (36.7 C)   SpO2: 100% 99%    General: Awake, mildly uncomfortable.  Is rambling and seemingly nonsensical but is able to endorse yes or no for questions regarding pain and seems to be some discomfort.  She cannot localize it. CV:  Good peripheral perfusion.  2+ radial pulse.  No significant murmur. Resp:  Normal effort.  Clear bilaterally. Abd:  No distention.  Soft. Other:  Somewhat difficult to tell if patient does seem mildly tender along her entire spine without any palpable step-offs.  She does not follow any commands but does seem tender on ranging the left upper extremity and at the left hip and left knee does not seem to have any significant tenderness on ranging her right upper extremity, right lower extremity or the left ankle.  No other gross trauma to the extremities.  2+ DP pulses.  Sensation seems intact to light touch in all extremities.  No other gross trauma to the face, head or neck.   ED Results / Procedures / Treatments  Labs (all labs ordered are listed, but only abnormal results are displayed) Labs Reviewed  CBC WITH DIFFERENTIAL/PLATELET - Abnormal; Notable for the following components:      Result Value   WBC 23.7 (*)    Hemoglobin 11.6 (*)    HCT 35.4 (*)    Platelets 429 (*)    Neutro Abs 20.4 (*)    Abs Immature Granulocytes 0.29 (*)    All other components within normal limits  COMPREHENSIVE METABOLIC PANEL - Abnormal; Notable for the following components:   Glucose, Bld 172 (*)    BUN 32 (*)    Creatinine, Ser 1.14 (*)    Albumin 3.4 (*)    GFR, Estimated 48 (*)    All other components within normal limits  URINALYSIS, COMPLETE (UACMP) WITH MICROSCOPIC - Abnormal; Notable for the following components:   Color, Urine YELLOW  (*)    APPearance HAZY (*)    Ketones, ur 5 (*)    Protein, ur 30 (*)    Bacteria, UA RARE (*)    All other components within normal limits  PROTIME-INR     EKG  EKG is remarkable for A-fib with a ventricular rate of 94, normal axis, unremarkable intervals without clear evidence  of acute ischemia or significant arrhythmia.   RADIOLOGY  CT head and C-spine on my interpretation without evidence of skull fracture, intracranial hemorrhage, edema, mass effect, acute ischemia or acute C-spine injury.  I reviewed radiology's interpretation and agree with their findings of same in addition to remote left temporal parietal infarct.  X-ray of the T and L-spine on my interpretation shows compression deformity L1 new from prior but there is evidence of some sclerosis and it is age-indeterminate.  There is also degenerative changes.  I reviewed radiology interpretation and agree to findings of same.   X-ray of the left hip on my interpretation shows a left proximal femur fracture with some displacement.  I reviewed their interpretation negative findings of angulated by cervical fracture of the left femoral neck with minimally displaced fractures of the left superior and inferior pubic rami.  X-ray of the left knee on my interpretation without evidence of acute fracture or dislocation.  I reviewed radiology interpretation and agree to findings of same in addition to osteoarthritis.  X-ray my interpretation without evidence of overt edema, no thorax, rib fracture, effusion, focal consolidation or other clear process.  I reviewed radiologist interpretation and agree to findings of cardiomegaly without other acute process.   X-ray of the left shoulder my interpretation without evidence of acute fracture dislocation.  I reviewed radiology interpretation and agree to findings of some AC arthrosis and degenerative changes.  X-ray of the left elbow and left wrist to my interpretation show possibly left  radial neck fracture and distal left radius and ulnar fractures.  I reviewed radiology interpretation and agree their findings of same.   PROCEDURES:  Critical Care performed: No  Procedures    MEDICATIONS ORDERED IN ED: Medications  acetaminophen (OFIRMEV) IV 1,000 mg (has no administration in time range)  fentaNYL (SUBLIMAZE) injection 50 mcg (50 mcg Intravenous Given 08/18/21 1144)     IMPRESSION / MDM / ASSESSMENT AND PLAN / ED COURSE  I reviewed the triage vital signs and the nursing notes. Patient's presentation is most consistent with acute presentation with potential threat to life or bodily function.                               Differential diagnosis includes, but is not limited to possible head contusion, skull fracture, intracranial hemorrhage, C-spine injury, muscle strain or contusion of the thoracic or lumbar spine as well as possible fractures in addition to possible soft tissue injury or fracture of the left shoulder, elbow, wrist, left hip and left knee.  CT head and C-spine on my interpretation without evidence of skull fracture, intracranial hemorrhage, edema, mass effect, acute ischemia or acute C-spine injury.  I reviewed radiology's interpretation and agree with their findings of same in addition to remote left temporal parietal infarct.  ECG shows A-fib.  X-ray of the T and L-spine on my interpretation shows compression deformity L1 new from prior but there is evidence of some sclerosis and it is age-indeterminate.  There is also degenerative changes.  I reviewed radiology interpretation and agree to findings of same.   X-ray of the left hip on my interpretation shows a left proximal femur fracture with some displacement.  I reviewed their interpretation negative findings of angulated by cervical fracture of the left femoral neck with minimally displaced fractures of the left superior and inferior pubic rami.  X-ray of the left knee on my interpretation  without evidence of acute  fracture or dislocation.  I reviewed radiology interpretation and agree to findings of same in addition to osteoarthritis.  X-ray my interpretation without evidence of overt edema, no thorax, rib fracture, effusion, focal consolidation or other clear process.  I reviewed radiologist interpretation and agree to findings of cardiomegaly without other acute process.   X-ray of the left shoulder my interpretation without evidence of acute fracture dislocation.  I reviewed radiology interpretation and agree to findings of some AC arthrosis and degenerative changes.  X-ray of the left elbow and left wrist to my interpretation show possibly left radial neck fracture and distal left radius and ulnar fractures.  I reviewed radiology interpretation and agree their findings of same.  CBC with WBC count of 23.7 I suspect reactive in the setting of trauma without clear evidence of acute infectious process and hemoglobin 11.6 and platelets of 429.  CMP shows a glucose of 172 without other significant significant electrolyte or metabolic derangements.  UA does not appear clinically infected.  INR is 1.1.  Discussed patient presentation and work-up with on-call orthopedist Dr. Mack Guise recommended placement of the left upper extremity sugar-tong splint admission to hospitalist service for evaluation of likely operative repair of the left hip fracture.  I also updated son.  In addition on reassessment patient does not seem to be as tender over her L-spine and I suspect this is likely not an acute lumbar compression fracture.  I will admit to medicine service for further evaluation and management.      FINAL CLINICAL IMPRESSION(S) / ED DIAGNOSES   Final diagnoses:  Closed fracture of left hip, initial encounter ([place])  Closed fracture of left wrist, initial encounter  Closed fracture of left elbow, initial encounter     Rx / DC Orders   ED Discharge Orders     None         Note:  This document was prepared using Dragon voice recognition software and may include unintentional dictation errors.   Lucrezia Starch, MD 08/18/21 1250

## 2021-08-18 NOTE — TOC Initial Note (Signed)
Transition of Care [facility]) - Initial/Assessment Note    Patient Details  Name: Courtney Lynn MRN: [id number] Date of Birth: [birth year]  [facility] CM/SW Contact:    Alberteen Sam, LCSW Phone Number: 08/18/2021, 2:44 PM  Clinical Narrative:                  Per Seth Bake with [facility], patient is a long term resident at [facility], will return at time of discharge. Prior to dc she will need an updated fl2.   TOC will continue to follow for discharge planning needs.   Expected Discharge Plan: Skilled Nursing Facility Barriers to Discharge: Continued Medical Work up   Patient Goals and CMS Choice Patient states their goals for this hospitalization and ongoing recovery are:: to go home CMS Medicare.gov Compare Post Acute Care list provided to:: Patient Choice offered to / list presented to : Patient  Expected Discharge Plan and Services Expected Discharge Plan: [place] arrangements for the past 2 months: [place]                                      Prior Living Arrangements/Services Living arrangements for the past 2 months: [place]) Lives with:: Facility Resident                   Activities of Daily Living Home Assistive Devices/Equipment: Environmental consultant (specify type), Wheelchair, Eyeglasses ADL Screening (condition at time of admission) Patient's cognitive ability adequate to safely complete daily activities?: No Is the patient deaf or have difficulty hearing?: No Does the patient have difficulty seeing, even when wearing glasses/contacts?: No Does the patient have difficulty concentrating, remembering, or making decisions?: Yes Patient able to express need for assistance with ADLs?: No Does the patient have difficulty dressing or bathing?: Yes Independently performs ADLs?: No Communication: Independent Dressing (OT): Needs assistance Is this a change from  baseline?: Pre-admission baseline Grooming: Needs assistance Is this a change from baseline?: Pre-admission baseline Feeding: Needs assistance Is this a change from baseline?: Pre-admission baseline Bathing: Dependent Is this a change from baseline?: Pre-admission baseline Toileting: Dependent Is this a change from baseline?: Pre-admission baseline In/Out Bed: Dependent Is this a change from baseline?: Pre-admission baseline Walks in Home: Needs assistance Is this a change from baseline?: Pre-admission baseline Does the patient have difficulty walking or climbing stairs?: Yes Weakness of Legs: Both Weakness of Arms/Hands: None  Permission Sought/Granted                  Emotional Assessment              Admission diagnosis:  Fracture of femoral neck, left, closed ([place]) [S72.002A] Patient Active Problem List   Diagnosis Date Noted   Fracture of femoral neck, left, closed ([place]) 08/18/2021   Left wrist fracture 08/18/2021   Pubic ramus fracture, left, closed, initial encounter ([place]) 08/18/2021   Closed compression fracture of body of L1 vertebra ([place]) 08/18/2021   PAF (paroxysmal atrial fibrillation) ([place]) 08/18/2021   Depression 08/18/2021   HLD (hyperlipidemia) 08/18/2021   Obesity with body mass index (BMI) of 30.0 to 39.9 08/18/2021   Iron deficiency anemia 08/18/2021   Leukocytosis 08/18/2021   Chronic kidney disease, stage 3a ([place]) 08/18/2021   Fracture of radial neck, left, closed 08/18/2021   Acute encephalopathy  09/25/2020   Fall 07/14/2020   Intracranial bleed ([place]) 07/13/2020   Dementia ([place]) 07/13/2020   Closed dislocation of right thumb 07/13/2020   AKI (acute kidney injury) ([place]) 07/13/2020   Syncope and collapse 07/13/2020   Major depressive disorder, recurrent, in remission ([place]) 10/17/2018   Loss of memory 03/14/2018   Poorly-controlled hypertension 01/05/2018   Hemorrhoids 12/15/2017   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   Postmenopausal osteoporosis  11/23/2017   Lumbar radiculopathy 07/14/2017   Osteopenia of the elderly 05/24/2017   Dizziness 12/13/2015   Intermittent vertigo 12/13/2015   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 ([place]) 12/07/2015   Fracture of left foot 10/26/2015   OSA on CPAP 04/14/2015   Nonrheumatic mitral valve insufficiency 09/10/2014   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[place]) 09/10/2014   Nonrheumatic mitral valve prolapse 09/10/2014   Premature ventricular contractions 09/10/2014   Other secondary pulmonary hypertension ([place]) 09/10/2014   Ventricular premature depolarization 09/10/2014   Bradycardia 07/01/2014   Hypertension 07/01/2014   Hyponatremia 07/01/2014   Hyposmolality and/or hyponatremia 07/01/2014   Chronic anemia 04/23/2014   Anemia 04/23/2014   Body mass index (BMI) of 37.0-37.9 in adult 03/28/2014   Chronic insomnia 03/28/2014   Depressive disorder 03/28/2014   Essential hypertension 03/28/2014   Gastroesophageal reflux disease without esophagitis 03/28/2014   Pre-diabetes 03/28/2014   Primary osteoarthritis of both knees 03/28/2014   Insomnia 03/28/2014   Depression, major, single episode, complete remission ([place]) 03/28/2014   Other obesity 03/28/2014   Acute bronchitis, viral 01/30/2014   PCP:  Venia Carbon, MD Pharmacy:   [id number] [address], [address] [place] [address] Phone: [phone] Fax: [place], [place] [address] [place] [address] Phone: [phone] Fax: [phone]     Social Determinants of Health (SDOH) Interventions    Readmission Risk Interventions     No data to display

## 2021-08-18 NOTE — Assessment & Plan Note (Signed)
-   IV hydralazine as needed -Hold Benicar due to slightly worsening renal function -Continue felodipine

## 2021-08-18 NOTE — Assessment & Plan Note (Signed)
-   Donepezil 

## 2021-08-18 NOTE — Assessment & Plan Note (Signed)
  BMI= 34.99   and BW= 84Kg -Diet and exercise.   -Need to to lose weight.

## 2021-08-18 NOTE — ED Notes (Signed)
Rn to bedside. Pt is alert but not able to answer questions or follow commands. Unknown of baseline. EMS did not advise if pt came from memory care unit or not. Unable to determine if pt is able to swallow pills or not at this time. Would recommend admitting physician place speech eval.

## 2021-08-18 NOTE — Assessment & Plan Note (Signed)
Slightly worsening than baseline.  Recent baseline creatinine 0.93 on 09/25/2020.  Her creatinine is 1.14, BUN 32 -Hold Benicar -IV fluid: Normal saline 75 cc/h

## 2021-08-18 NOTE — Assessment & Plan Note (Addendum)
Heart rate 82, not on anticoagulants.  Patient is currently not taking nodal blockers -Telemetry monitoring

## 2021-08-18 NOTE — Assessment & Plan Note (Signed)
WBC 23.7, no source of infection identified, likely reactive -Follow-up with CBC -For now for urinalysis

## 2021-08-18 NOTE — Assessment & Plan Note (Signed)
-  hold plavix for surgery -lipitor

## 2021-08-18 NOTE — Assessment & Plan Note (Signed)
Patient has multiple fractures, including left femoral neck fracture, left wrist fracture, left radial neck fracture, left pubic rami fracture.  Patient also has L1 compression fracture which seems to be chronic issue.  Consulted Dr. Mack Guise of Ortho, likely need surgery.  - will admit to tele bed as inpt - Pain control: morphine prn and percocet, lidoderm patch - When necessary Zofran for nausea - Robaxin for muscle spasm - type and cross - INR/PTT - PT/OT when able to (not ordered now)

## 2021-08-18 NOTE — Assessment & Plan Note (Signed)
-  pain control

## 2021-08-18 NOTE — Assessment & Plan Note (Signed)
Celexa

## 2021-08-18 NOTE — Assessment & Plan Note (Signed)
Hemoglobin stable 11.6.  Patient is currently not taking iron supplements -Follow-up with CBC

## 2021-08-18 NOTE — ED Triage Notes (Signed)
Presents via EMS from [facility]  s/p fall  Per EMS the fall was witnessed by staff  She fell backwards  Hitting her head  No LOC  Having pain to left wrist ,hip and leg   FSBS 143

## 2021-08-18 NOTE — H&P (Signed)
History and Physical    Courtney Lynn:[id number] DOB: [birth year] DOA: 08/18/2021  Referring MD/NP/PA:   PCP: Venia Carbon, MD   Patient coming from:  The patient is coming from SNF       Chief Complaint: fall  HPI: Courtney Lynn is a 81 y.o. female with medical history significant of HTN, HLD, stroke, dementia, GERD, depression, OSA on CPAP, mitral valve regurgitation, breast cancer (s/p of right partial mastectomy and radiation therapy), obesity with BMI 34.99, anemia, vertigo, PVC, dementia, mild pulmonary hypertension, PAF not anticoagulants, who presents with fall.  Per report, pt had witnessed fall in facility this AM. No LOC. Pt fell while she was ambulating with a walker when she reached up to scratch herself and she fell from a standing position. She injured her head, left hip and left arm.  She obviously has pain in left hip and left arm, but cannot provide detailed information to characterize her pain.  Patient does not have active nausea, vomiting, diarrhea, respiratory distress cough noted.  Does not seem to have abdominal pain or chest pain.  Per her brother (I called her brother by phone), patient has history of dementia after stroke.  Normally patient is not orientated x3.  Currently patient is alert, mental status seems to be at baseline.   Data reviewed independently and ED Course: pt was found to have WBC 23.7, INR 1.1, renal function slightly worsened than baseline, temperature normal, blood pressure 139/88, heart rate 82, RR 18.  Patient is admitted to telemetry bed as inpatient.  Dr. Mack Guise of Ortho is consulted.  CT head and C-spine: Negative for acute injury   X-ray of the T and L-spine: compression deformity L1 new from prior but there is evidence of some sclerosis and it is age-indeterminate.  There is also degenerative changes.      X-ray of left hip/pelvis: Displaced left femoral neck fracture and displaced fractures of the left superior and  inferior pubic rami.   X-ray of left knee and left shoulder: negative for bony fracture  X-ray of the left elbow and left wrist: possibly left radial neck fracture and distal left radius and ulnar fractures.    CXR: CM and negative for infiltration   EKG: I have personally reviewed.  Atrial fibrillation, QTc 05/09/1950, no obvious ischemic change   Review of Systems: Could not reviewed due to dementia   Allergy:  Allergies  Allergen Reactions   Cleocin [Clindamycin Hcl] Swelling, Rash and Other (See Comments)    Tongue, lip swelling Tongue, lip swelling Tongue, lip swelling   Pen Vk [Penicillin V] Swelling, Rash and Other (See Comments)    Tongue, lip swelling Funny feeling on the tongue  Has patient had a PCN reaction causing immediate rash, facial/tongue/throat swelling, SOB or lightheadedness with hypotension:Yes Has patient had a PCN reaction causing severe rash involving mucus membranes or skin necrosis:No Has patient had a PCN reaction that required hospitalization:No Has patient had a PCN reaction occurring within the last 10 years:No If all of the above answers are "NO", then may proceed with Cephalosporin use.     Past Medical History:  Diagnosis Date   Anemia    Arthritis    Body mass index (bmi) 37.0-37.9, adult    Breast cancer ([place]) 01/02/2016   right breast/radiation   Cancer ([place]) 2017   BREAST   Chronic insomnia    Complication of anesthesia    has difficulty last surgery intubating   Depression  Difficult intubation    WITH TKR   Dizziness    Dysrhythmia    PVC'S,BRADYCARDIA   GERD (gastroesophageal reflux disease)    History of chicken pox    Hypertension    Hyponatremia    Intermittent vertigo    Irregular heart beat    Memory difficulties    Mild mitral regurgitation    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HCC)    MVP (mitral valve prolapse)    Orthopnea    Personal history of radiation therapy 2017   Pre-diabetes    PVC's (premature  ventricular contractions)    Reflux    Sleep apnea    CPAP    Past Surgical History:  Procedure Laterality Date   APPENDECTOMY     BREAST BIOPSY Right 01/02/2016   PARTIAL MASTECTOMY- [facility] and [facility]   BREAST LUMPECTOMY Right 2017   CATARACT EXTRACTION W/PHACO Right 09/14/2016   Procedure: CATARACT EXTRACTION PHACO AND INTRAOCULAR LENS PLACEMENT ([place]);  Surgeon: Birder Robson, MD;  Location: [facility] ORS;  Service: Ophthalmology;  Laterality: Right;  [place] 00:32 AP% 19.5 CDE 6.28 Fluid pack lot # [id number]   CATARACT EXTRACTION W/PHACO Left 10/05/2016   Procedure: CATARACT EXTRACTION PHACO AND INTRAOCULAR LENS PLACEMENT ([facility]);  Surgeon: Birder Robson, MD;  Location: [facility] ORS;  Service: Ophthalmology;  Laterality: Left;  [place] 00:46.9 AP% 16.0 CDE 7.48 Fluid Pack lot # [id number]   CHOLECYSTECTOMY     COLONOSCOPY WITH PROPOFOL N/A 08/24/2016   Procedure: COLONOSCOPY WITH PROPOFOL;  Surgeon: Lollie Sails, MD;  Location: [facility] ENDOSCOPY;  Service: Endoscopy;  Laterality: N/A;   COLONOSCOPY WITH PROPOFOL N/A 07/12/2018   Procedure: COLONOSCOPY WITH PROPOFOL;  Surgeon: Toledo, Benay Pike, MD;  Location: [facility] ENDOSCOPY;  Service: Gastroenterology;  Laterality: N/A;   dental implants     FOOT SURGERY Right    FRACTURE SURGERY     KNEE CAP   HEMORRHOID SURGERY N/A 04/19/2019   Procedure: HEMORRHOIDECTOMY;  Surgeon: Benjamine Sprague, DO;  Location: [facility] ORS;  Service: General;  Laterality: N/A;   JOINT REPLACEMENT Right 2011   KNEE   PARTIAL MASTECTOMY WITH NEEDLE LOCALIZATION Right 01/02/2016   Procedure: PARTIAL MASTECTOMY WITH NEEDLE LOCALIZATION;  Surgeon: Leonie Green, MD;  Location: [facility] ORS;  Service: General;  Laterality: Right;   PATELLA FRACTURE SURGERY     REPLACEMENT TOTAL KNEE Right    SENTINEL NODE BIOPSY Right 01/02/2016   Procedure: SENTINEL NODE BIOPSY;  Surgeon: Leonie Green, MD;  Location: [facility] ORS;  Service: General;  Laterality: Right;   TONSILLECTOMY      Social  History:  reports that she has quit smoking. She has never used smokeless tobacco. She reports current alcohol use of about 7.0 standard drinks of alcohol per week. She reports that she does not use drugs.  Family History:  Family History  Problem Relation Age of Onset   GI Bleed Mother    Diabetes Father    Congestive Heart Failure Father    Breast cancer Neg Hx      Prior to Admission medications   Medication Sig Start Date End Date Taking? Authorizing Provider  acetaminophen (TYLENOL) 500 MG tablet Take 1,000 mg by mouth at bedtime.    [provider]  atorvastatin (LIPITOR) 80 MG tablet Take 1 tablet (80 mg total) by mouth daily. 09/29/20   Sidney Ace, MD  citalopram (CELEXA) 10 MG tablet Take 10 mg by mouth every other day.    [provider]  clopidogrel (PLAVIX) 75 MG tablet  Take 1 tablet (75 mg total) by mouth daily. 09/29/20   Sreenath, Sudheer B, MD  docusate sodium (COLACE) 100 MG capsule Take 100 mg by mouth daily.     [provider]  donepezil (ARICEPT) 10 MG tablet Take 10 mg by mouth at bedtime.    [provider]  felodipine (PLENDIL) 5 MG 24 hr tablet Take 5 mg by mouth daily.    [provider]  gabapentin (NEURONTIN) 100 MG capsule Take 200 mg by mouth at bedtime.    [provider]  hydrALAZINE (APRESOLINE) 50 MG tablet Take 1 tablet (50 mg total) by mouth every 8 (eight) hours. 09/29/20   Sidney Ace, MD  iron polysaccharides (NIFEREX) 150 MG capsule Take 150 mg by mouth daily.    [provider]  lactase (LACTAID) 3000 units tablet Take 1 tablet by mouth daily.    [provider]  methocarbamol (ROBAXIN) 500 MG tablet Take 500 mg by mouth at bedtime.    [provider]  Multiple Vitamin (MULTIVITAMIN) tablet Take 1 tablet by mouth 2 (two) times daily.    [provider]  olmesartan (BENICAR) 40 MG tablet Take 40 mg by mouth daily.  03/25/18   [provider]   omeprazole (PRILOSEC) 20 MG capsule Take 20 mg by mouth daily.    [provider]  OVER THE COUNTER MEDICATION Take 1 capsule by mouth daily. **Shaklee OmegaGuard**    [provider]  OVER THE COUNTER MEDICATION Take 1 capsule by mouth 2 (two) times daily. **Omega Q Plus**    [provider]  OVER THE COUNTER MEDICATION Take 1 tablet by mouth daily. **OptiBiotic**    [provider]  OVER THE COUNTER MEDICATION Take 2 capsules by mouth daily. **Shaklee OsteoMatrix**    [provider]  OVER THE COUNTER MEDICATION Take 1 capsule by mouth daily. **LifeVantage Protandim**    [provider]  OVER THE COUNTER MEDICATION Take 2 tablets by mouth daily with lunch. **Shaklee Blood Pressure**    [provider]  Wheat Dextrin (BENEFIBER) CHEW Chew 1 tablet by mouth daily.    [provider]    Physical Exam: Vitals:   08/18/21 1230 08/18/21 1300 08/18/21 1638 08/18/21 1639  BP: 139/88 (!) 145/74 139/85 139/85  Pulse: 82 (!) 101 93 93  Resp: '16 16 16   '$ Temp:  98.2 F (36.8 C) 98.2 F (36.8 C) 98.2 F (36.8 C)  TempSrc:  Axillary  Oral  SpO2: 99% 99% 99% 99%  Weight:      Height:       General: Not in acute distress HEENT:       Eyes: PERRL, EOMI, no scleral icterus.       ENT: No discharge from the ears and nose       Neck: No JVD, no bruit, no mass felt. Heme: No neck lymph node enlargement. Cardiac: S1/S2, RRR, No murmurs, No gallops or rubs. Respiratory: No rales, wheezing, rhonchi or rubs. GI: Soft, nondistended, nontender, no organomegaly, BS present. GU: No hematuria Ext: No pitting leg edema bilaterally. 1+DP/PT pulse bilaterally. Musculoskeletal: Patient has tenderness in left hip, left elbow, left wrist Skin: No rashes.  Neuro: Alert, not oriented X3, not following command, cranial nerves II-XII grossly intact, moves all extremities Psych: Patient is not psychotic, no suicidal or hemocidal  ideation.  Labs on Admission: I have personally reviewed following labs and imaging studies  CBC: Recent Labs  Lab 08/18/21 1123  WBC  23.7*  NEUTROABS 20.4*  HGB 11.6*  HCT 35.4*  MCV 89.4  PLT 295*   Basic Metabolic Panel: Recent Labs  Lab 08/18/21 1123  NA 138  K 4.0  CL 107  CO2 22  GLUCOSE 172*  BUN 32*  CREATININE 1.14*  CALCIUM 9.2   GFR: Estimated Creatinine Clearance: 37.4 mL/min (A) (by C-G formula based on SCr of 1.14 mg/dL (H)). Liver Function Tests: Recent Labs  Lab 08/18/21 1123  AST 32  ALT 28  ALKPHOS 115  BILITOT 0.7  PROT 6.5  ALBUMIN 3.4*   No results for input(s): "LIPASE", "AMYLASE" in the last 168 hours. No results for input(s): "AMMONIA" in the last 168 hours. Coagulation Profile: Recent Labs  Lab 08/18/21 1123  INR 1.1   Cardiac Enzymes: No results for input(s): "CKTOTAL", "CKMB", "CKMBINDEX", "TROPONINI" in the last 168 hours. BNP (last 3 results) No results for input(s): "PROBNP" in the last 8760 hours. HbA1C: No results for input(s): "HGBA1C" in the last 72 hours. CBG: Recent Labs  Lab 08/18/21 1713  GLUCAP 130*   Lipid Profile: No results for input(s): "CHOL", "HDL", "LDLCALC", "TRIG", "CHOLHDL", "LDLDIRECT" in the last 72 hours. Thyroid Function Tests: No results for input(s): "TSH", "T4TOTAL", "FREET4", "T3FREE", "THYROIDAB" in the last 72 hours. Anemia Panel: No results for input(s): "VITAMINB12", "FOLATE", "FERRITIN", "TIBC", "IRON", "RETICCTPCT" in the last 72 hours. Urine analysis:    Component Value Date/Time   COLORURINE YELLOW (A) 08/18/2021 1227   APPEARANCEUR HAZY (A) 08/18/2021 1227   LABSPEC 1.026 08/18/2021 1227   PHURINE 5.0 08/18/2021 1227   GLUCOSEU NEGATIVE 08/18/2021 1227   HGBUR NEGATIVE 08/18/2021 1227   BILIRUBINUR NEGATIVE 08/18/2021 1227   KETONESUR 5 (A) 08/18/2021 1227   PROTEINUR 30 (A) 08/18/2021 1227   NITRITE NEGATIVE 08/18/2021 1227   LEUKOCYTESUR NEGATIVE 08/18/2021 1227    Sepsis Labs: '@LABRCNTIP'$ (procalcitonin:4,lacticidven:4) )No results found for this or any previous visit (from the past 240 hour(s)).   Radiological Exams on Admission: DG Chest Portable 1 View  Result Date: 08/18/2021 CLINICAL DATA:  Fall EXAM: PORTABLE CHEST 1 VIEW COMPARISON:  07/13/2020 FINDINGS: Cardiomegaly. Both lungs are clear. The visualized skeletal structures are unremarkable. IMPRESSION: Cardiomegaly without acute abnormality of the lungs in AP portable projection. Electronically Signed   By: Delanna Ahmadi M.D.   On: 08/18/2021 12:19   DG Wrist Complete Left  Result Date: 08/18/2021 CLINICAL DATA:  Fall, pain EXAM: LEFT WRIST - COMPLETE 3+ VIEW COMPARISON:  None Available. FINDINGS: Osteopenia. Impacted fractures of the distal left radius and ulna which appear to be intra-articular in the radius. Minimal dorsal angulation. The carpus proper is normally aligned. Diffuse soft tissue edema about the wrist. IMPRESSION: 1. Impacted fractures of the distal left radius and ulna, which appear to be intra-articular in the radius. Minimal dorsal angulation. 2.  The carpus proper is normally aligned. 3.  Diffuse soft tissue edema about the wrist. Electronically Signed   By: Delanna Ahmadi M.D.   On: 08/18/2021 12:18   DG Elbow Complete Left  Result Date: 08/18/2021 CLINICAL DATA:  Fall, pain EXAM: LEFT ELBOW - COMPLETE 3+ VIEW COMPARISON:  None Available. FINDINGS: Suspect a subtle impacted fracture of the left radial neck. Associated elbow joint effusion. Moderate elbow joint arthrosis. Soft tissues unremarkable. IMPRESSION: 1. Suspect a subtle impacted fracture of the left radial neck. Associated elbow joint effusion. 2. Moderate elbow joint arthrosis. Electronically Signed   By: Delanna Ahmadi M.D.   On: 08/18/2021 12:17   DG Shoulder  Left  Result Date: 08/18/2021 CLINICAL DATA:  Fall, pain EXAM: LEFT SHOULDER - 2+ VIEW COMPARISON:  None Available. FINDINGS: No fracture or dislocation of the  left shoulder. Mild glenohumeral and acromioclavicular arthrosis. Partially imaged left chest is unremarkable. IMPRESSION: No fracture or dislocation of the left shoulder. Mild glenohumeral and acromioclavicular arthrosis. Electronically Signed   By: Delanna Ahmadi M.D.   On: 08/18/2021 12:16   DG Knee Complete 4 Views Left  Result Date: 08/18/2021 CLINICAL DATA:  Fall, knee pain EXAM: LEFT KNEE - COMPLETE 4+ VIEW COMPARISON:  None Available. FINDINGS: No fracture or dislocation of the left knee. Mild tricompartmental joint space narrowing and osteophytosis, worst in the patellofemoral compartment. No knee joint effusion. Vascular calcinosis. IMPRESSION: No fracture or dislocation of the left knee. Mild tricompartmental osteoarthritis, worst in the patellofemoral compartment. Electronically Signed   By: Delanna Ahmadi M.D.   On: 08/18/2021 12:14   DG Hip Unilat W or Wo Pelvis 2-3 Views Left  Result Date: 08/18/2021 CLINICAL DATA:  Fall, pain EXAM: DG HIP (WITH OR WITHOUT PELVIS) 2-3V LEFT COMPARISON:  None Available. FINDINGS: Foreshortened, angulated basicervical fracture of the left femoral neck. Additional minimally displaced fractures of the left superior and inferior pubic rami. No displaced fracture or dislocation of the right hemipelvis or proximal right femur seen in single frontal view. Calcified uterine fibroids. Nonobstructive pattern of overlying bowel gas. IMPRESSION: 1. Foreshortened, angulated basicervical fracture of the left femoral neck. 2. Additional minimally displaced fractures of the left superior and inferior pubic rami. 3. No displaced fracture or dislocation of the right hemipelvis or proximal right femur seen in single frontal view. Electronically Signed   By: Delanna Ahmadi M.D.   On: 08/18/2021 12:11   DG Lumbar Spine 2-3 Views  Result Date: 08/18/2021 CLINICAL DATA:  Fall, pain EXAM: LUMBAR SPINE - 2-3 VIEW COMPARISON:  MR lumbar spine, 09/01/2019 FINDINGS: Osteopenia. New wedge  deformity of the L1 vertebral body with approximately 30% anterior height loss. This appears sclerotic and nonacute although is new compared to most recent imaging of the lumbar spine dated 09/01/2019. No other fracture or dislocation of the lumbar spine. Mild to moderate multilevel disc space height loss and osteophytosis throughout, worst at L4-L5. Nonobstructive pattern of overlying bowel gas. Calcified uterine fibroids. IMPRESSION: 1. New wedge deformity of the L1 vertebral body with approximately 30% anterior height loss. This appears sclerotic and nonacute although is new compared to most recent imaging of the lumbar spine dated 09/01/2019. Correlate with acute point tenderness. MRI may be used to assess for marrow edema and fracture acuity if desired. 2.  Disc degenerative disease of the lumbar spine. Electronically Signed   By: Delanna Ahmadi M.D.   On: 08/18/2021 12:08   DG Thoracic Spine 2 View  Result Date: 08/18/2021 CLINICAL DATA:  Fall, pain EXAM: THORACIC SPINE 2 VIEWS COMPARISON:  None Available. FINDINGS: Osteopenia. No fracture or dislocation of the thoracic spine. Mild multilevel disc space height loss and osteophytosis throughout. Cardiomegaly. No acute abnormality of the included chest. IMPRESSION: 1. No fracture or dislocation of the thoracic spine. Mild multilevel thoracic disc degenerative disease. 2. Osteopenia. Electronically Signed   By: Delanna Ahmadi M.D.   On: 08/18/2021 12:03   CT HEAD WO CONTRAST (5MM)  Result Date: 08/18/2021 CLINICAL DATA:  Head trauma, minor (Age >= 65y); Neck trauma (Age >= 65y) EXAM: CT HEAD WITHOUT CONTRAST CT CERVICAL SPINE WITHOUT CONTRAST TECHNIQUE: Multidetector CT imaging of the head and cervical spine was performed  following the standard protocol without intravenous contrast. Multiplanar CT image reconstructions of the cervical spine were also generated. RADIATION DOSE REDUCTION: This exam was performed according to the departmental dose-optimization  program which includes automated exposure control, adjustment of the mA and/or kV according to patient size and/or use of iterative reconstruction technique. COMPARISON:  CTA Head 09/25/2020. FINDINGS: CT HEAD FINDINGS Brain: No evidence of acute infarction, hemorrhage, hydrocephalus, extra-axial collection or mass lesion/mass effect. Remote infarct in the left temporoparietal region. Cerebral atrophy. Vascular: No hyperdense vessel identified. Skull: No acute fracture. Sinuses/Orbits: Left maxillary sinus retention cyst. Otherwise, clear sinuses. No acute orbital findings. Other: No mastoid effusions. CT CERVICAL SPINE FINDINGS Alignment: Normal. Skull base and vertebrae: Similar mild height loss of the C7 vertebral body. No evidence of acute fracture. Soft tissues and spinal canal: No prevertebral fluid or swelling. No visible canal hematoma. Disc levels:  Similar multilevel degenerative change. Upper chest: Visualized lung apices are clear. IMPRESSION: 1. No evidence of acute intracranial abnormality. 2. No evidence of acute fracture or traumatic malalignment the cervical spine. 3. Remote left temporoparietal infarct. Electronically Signed   By: Margaretha Sheffield M.D.   On: 08/18/2021 11:10   CT Cervical Spine Wo Contrast  Result Date: 08/18/2021 CLINICAL DATA:  Head trauma, minor (Age >= 65y); Neck trauma (Age >= 65y) EXAM: CT HEAD WITHOUT CONTRAST CT CERVICAL SPINE WITHOUT CONTRAST TECHNIQUE: Multidetector CT imaging of the head and cervical spine was performed following the standard protocol without intravenous contrast. Multiplanar CT image reconstructions of the cervical spine were also generated. RADIATION DOSE REDUCTION: This exam was performed according to the departmental dose-optimization program which includes automated exposure control, adjustment of the mA and/or kV according to patient size and/or use of iterative reconstruction technique. COMPARISON:  CTA Head 09/25/2020. FINDINGS: CT HEAD  FINDINGS Brain: No evidence of acute infarction, hemorrhage, hydrocephalus, extra-axial collection or mass lesion/mass effect. Remote infarct in the left temporoparietal region. Cerebral atrophy. Vascular: No hyperdense vessel identified. Skull: No acute fracture. Sinuses/Orbits: Left maxillary sinus retention cyst. Otherwise, clear sinuses. No acute orbital findings. Other: No mastoid effusions. CT CERVICAL SPINE FINDINGS Alignment: Normal. Skull base and vertebrae: Similar mild height loss of the C7 vertebral body. No evidence of acute fracture. Soft tissues and spinal canal: No prevertebral fluid or swelling. No visible canal hematoma. Disc levels:  Similar multilevel degenerative change. Upper chest: Visualized lung apices are clear. IMPRESSION: 1. No evidence of acute intracranial abnormality. 2. No evidence of acute fracture or traumatic malalignment the cervical spine. 3. Remote left temporoparietal infarct. Electronically Signed   By: Margaretha Sheffield M.D.   On: 08/18/2021 11:10      Assessment/Plan Principal Problem:   Fracture of femoral neck, left, closed (HCC) Active Problems:   Left wrist fracture   Fracture of radial neck, left, closed   Pubic ramus fracture, left, closed, initial encounter ([place])   Closed compression fracture of body of L1 vertebra (HCC)   Fall   PAF (paroxysmal atrial fibrillation) (HCC)   Hypertension   HLD (hyperlipidemia)   Depression   Obesity with body mass index (BMI) of 30.0 to 39.9   Dementia (HCC)   OSA on CPAP   Iron deficiency anemia   Leukocytosis   Chronic kidney disease, stage 3a (HCC)   Stroke ([place])   Assessment and Plan: * Fracture of femoral neck, left, closed ([place]) Patient has multiple fractures, including left femoral neck fracture, left wrist fracture, left radial neck fracture, left pubic rami fracture.  Patient  also has L1 compression fracture which seems to be chronic issue.  Consulted Dr. Mack Guise of Ortho, likely need  surgery.  - will admit to tele bed as inpt - Pain control: morphine prn and percocet, lidoderm patch - When necessary Zofran for nausea - Robaxin for muscle spasm - type and cross - INR/PTT - PT/OT when able to (not ordered now)  Fracture of radial neck, left, closed -see above   Left wrist fracture -see above   Pubic ramus fracture, left, closed, initial encounter ([place]) -pain control  Closed compression fracture of body of L1 vertebra (HCC) -pain control  Fall - PT/OT when able to  PAF (paroxysmal atrial fibrillation) (HCC) Heart rate 82, not on anticoagulants.  Patient is currently not taking nodal blockers -Telemetry monitoring  Hypertension - IV hydralazine as needed -Hold Benicar due to slightly worsening renal function -Continue felodipine  HLD (hyperlipidemia) - Lipitor  Depression - Celexa  Obesity with body mass index (BMI) of 30.0 to 39.9  BMI= 34.99   and BW= 84Kg -Diet and exercise.   -Need to to lose weight.   Dementia ([place]) - Donepezil  Stroke ([place]) -hold plavix for surgery -lipitor  Chronic kidney disease, stage 3a ([place]) Slightly worsening than baseline.  Recent baseline creatinine 0.93 on 09/25/2020.  Her creatinine is 1.14, BUN 32 -Hold Benicar -IV fluid: Normal saline 75 cc/h  Leukocytosis WBC 23.7, no source of infection identified, likely reactive -Follow-up with CBC -For now for urinalysis  Iron deficiency anemia Hemoglobin stable 11.6.  Patient is currently not taking iron supplements -Follow-up with CBC        Perioperative Cardiac Risk: pt has multiple comorbidities, including ###.     No recent acute cardiac issues.  Patient does not have chest pain, shortness of breath, palpitation, leg edema.  No signs of acute CHF exacerbation currently.  EKG has no acute change. At this time point, no further work up is needed. Patient's GUPTA score perioperative myocardial infarction or cardaic arrest is 2.34 %.  I discussed  the risk with her brother by phone. He wants to discuss with Dr. Mack Guise of Ortho to make final decision.  DVT ppx: SCD  Code Status: DNR  Family Communication:  Yes, patient's brother  by phone  Disposition Plan:  Anticipate discharge back to previous environment  Consults called:  Dr. Mack Guise of ortho  Admission status and Level of care: Telemetry Medical:   as inpt            Severity of Illness:  The appropriate patient status for this patient is INPATIENT. Inpatient status is judged to be reasonable and necessary in order to provide the required intensity of service to ensure the patient's safety. The patient's presenting symptoms, physical exam findings, and initial radiographic and laboratory data in the context of their chronic comorbidities is felt to place them at high risk for further clinical deterioration. Furthermore, it is not anticipated that the patient will be medically stable for discharge from the hospital within 2 midnights of admission.   * I certify that at the point of admission it is my clinical judgment that the patient will require inpatient hospital care spanning beyond 2 midnights from the point of admission due to high intensity of service, high risk for further deterioration and high frequency of surveillance required.*       Date of Service 08/18/2021    Ivor Costa [facility]   If 7PM-7AM, please contact night-coverage [email or web address] 08/18/2021, 6:24 PM

## 2021-08-18 NOTE — Assessment & Plan Note (Signed)
Lipitor 

## 2021-08-18 NOTE — Progress Notes (Signed)
Admission profile udpated   Spoke with RN at [facility] who states patient is alert and fairly independent in the long-term care setting, is "intentional with her actions" but can have jumbled speech ("word salad") at baseline. Primary RN notified; speech consulted.

## 2021-08-18 NOTE — Progress Notes (Signed)
PHARMACIST - PHYSICIAN ORDER COMMUNICATION  CONCERNING: P&T Medication Policy on Herbal Medications  DESCRIPTION:  This patient's order for:  benefiber chew  has been noted.  This product(s) is classified as an "herbal" or natural product. Due to a lack of definitive safety studies or FDA approval, nonstandard manufacturing practices, plus the potential risk of unknown drug-drug interactions while on inpatient medications, the Pharmacy and Therapeutics Committee does not permit the use of "herbal" or natural products of this type within [facility].   ACTION TAKEN: The pharmacy department is unable to verify this order at this time and your patient has been informed of this safety policy. Please reevaluate patient's clinical condition at discharge and address if the herbal or natural product(s) should be resumed at that time.

## 2021-08-18 NOTE — Assessment & Plan Note (Signed)
-  see above 

## 2021-08-18 NOTE — Consult Note (Addendum)
ORTHOPAEDIC CONSULTATION  REQUESTING PHYSICIAN: Ivor Costa, MD  Chief Complaint: Left hip and arm pain status post fall  HPI: Courtney Lynn is a 81 y.o. female with dementia who resides at [facility] who is brought to the [facility] regional emergency department after a fall while using her walker today.  Patient had numerous x-rays upon arrival to the emergency department and was found to have fractures of the left distal radius and ulna and radial head of the left forearm.  Patient also had fractures of the left superior and inferior pubic rami and a displaced left femoral neck hip fracture.  Patient has advanced dementia and a history of stroke and is unable to provide a history.  Patient was seen in her hospital room this evening.  She was awake but did not talk, responding to questions or follow commands.  Past Medical History:  Diagnosis Date   Anemia    Arthritis    Body mass index (bmi) 37.0-37.9, adult    Breast cancer ([place]) 01/02/2016   right breast/radiation   Cancer ([place]) 2017   BREAST   Chronic insomnia    Complication of anesthesia    has difficulty last surgery intubating   Depression    Difficult intubation    WITH TKR   Dizziness    Dysrhythmia    PVC'S,BRADYCARDIA   GERD (gastroesophageal reflux disease)    History of chicken pox    Hypertension    Hyponatremia    Intermittent vertigo    Irregular heart beat    Memory difficulties    Mild mitral regurgitation    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HCC)    MVP (mitral valve prolapse)    Orthopnea    Personal history of radiation therapy 2017   Pre-diabetes    PVC's (premature ventricular contractions)    Reflux    Sleep apnea    CPAP   Past Surgical History:  Procedure Laterality Date   APPENDECTOMY     BREAST BIOPSY Right 01/02/2016   PARTIAL MASTECTOMY- [facility] and [facility]   BREAST LUMPECTOMY Right 2017   CATARACT EXTRACTION W/PHACO Right 09/14/2016   Procedure: CATARACT EXTRACTION PHACO AND INTRAOCULAR LENS  PLACEMENT (Central);  Surgeon: Birder Robson, MD;  Location: [facility] ORS;  Service: Ophthalmology;  Laterality: Right;  [place] 00:32 AP% 19.5 CDE 6.28 Fluid pack lot # [id number]   CATARACT EXTRACTION W/PHACO Left 10/05/2016   Procedure: CATARACT EXTRACTION PHACO AND INTRAOCULAR LENS PLACEMENT (IOC);  Surgeon: Birder Robson, MD;  Location: [facility] ORS;  Service: Ophthalmology;  Laterality: Left;  [place] 00:46.9 AP% 16.0 CDE 7.48 Fluid Pack lot # [id number]   CHOLECYSTECTOMY     COLONOSCOPY WITH PROPOFOL N/A 08/24/2016   Procedure: COLONOSCOPY WITH PROPOFOL;  Surgeon: Lollie Sails, MD;  Location: [facility] ENDOSCOPY;  Service: Endoscopy;  Laterality: N/A;   COLONOSCOPY WITH PROPOFOL N/A 07/12/2018   Procedure: COLONOSCOPY WITH PROPOFOL;  Surgeon: Toledo, Benay Pike, MD;  Location: [facility] ENDOSCOPY;  Service: Gastroenterology;  Laterality: N/A;   dental implants     FOOT SURGERY Right    FRACTURE SURGERY     KNEE CAP   HEMORRHOID SURGERY N/A 04/19/2019   Procedure: HEMORRHOIDECTOMY;  Surgeon: Benjamine Sprague, DO;  Location: [facility] ORS;  Service: General;  Laterality: N/A;   JOINT REPLACEMENT Right 2011   KNEE   PARTIAL MASTECTOMY WITH NEEDLE LOCALIZATION Right 01/02/2016   Procedure: PARTIAL MASTECTOMY WITH NEEDLE LOCALIZATION;  Surgeon: Leonie Green, MD;  Location: [facility] ORS;  Service: General;  Laterality: Right;  PATELLA FRACTURE SURGERY     REPLACEMENT TOTAL KNEE Right    SENTINEL NODE BIOPSY Right 01/02/2016   Procedure: SENTINEL NODE BIOPSY;  Surgeon: Leonie Green, MD;  Location: [facility] ORS;  Service: General;  Laterality: Right;   TONSILLECTOMY     Social History   Socioeconomic History   Marital status: Widowed    Spouse name: Not on file   Number of children: Not on file   Years of education: Not on file   Highest education level: Not on file  Occupational History   Not on file  Tobacco Use   Smoking status: Former   Smokeless tobacco: Never   Tobacco comments:    Quit 1969   Vaping Use   Vaping Use: Never used  Substance and Sexual Activity   Alcohol use: Yes    Alcohol/week: 7.0 standard drinks of alcohol    Types: 7 Glasses of wine per week    Comment: OCCASIONALLY   Drug use: No   Sexual activity: Not on file  Other Topics Concern   Not on file  Social History Narrative   Not on file   Social Determinants of Health   Financial Resource Strain: Not on file  Food Insecurity: Not on file  Transportation Needs: Not on file  Physical Activity: Not on file  Stress: Not on file  Social Connections: Not on file   Family History  Problem Relation Age of Onset   GI Bleed Mother    Diabetes Father    Congestive Heart Failure Father    Breast cancer Neg Hx    Allergies  Allergen Reactions   Cleocin [Clindamycin Hcl] Swelling, Rash and Other (See Comments)    Tongue, lip swelling Tongue, lip swelling Tongue, lip swelling   Pen Vk [Penicillin V] Swelling, Rash and Other (See Comments)    Tongue, lip swelling Funny feeling on the tongue  Has patient had a PCN reaction causing immediate rash, facial/tongue/throat swelling, SOB or lightheadedness with hypotension:Yes Has patient had a PCN reaction causing severe rash involving mucus membranes or skin necrosis:No Has patient had a PCN reaction that required hospitalization:No Has patient had a PCN reaction occurring within the last 10 years:No If all of the above answers are "NO", then may proceed with Cephalosporin use.    Prior to Admission medications   Medication Sig Start Date End Date Taking? Authorizing Provider  acetaminophen (TYLENOL) 500 MG tablet Take 1,000 mg by mouth at bedtime.   Yes [provider]  atorvastatin (LIPITOR) 80 MG tablet Take 1 tablet (80 mg total) by mouth daily. 09/29/20  Yes Sreenath, Sudheer B, MD  citalopram (CELEXA) 10 MG tablet Take 10 mg by mouth every other day.   Yes [provider]  clopidogrel (PLAVIX) 75 MG tablet Take 1 tablet (75 mg  total) by mouth daily. 09/29/20  Yes Sreenath, Sudheer B, MD  donepezil (ARICEPT) 10 MG tablet Take 10 mg by mouth at bedtime.   Yes [provider]  famotidine (PEPCID) 20 MG tablet Take 1 tablet by mouth daily. 06/01/21  Yes [provider]  felodipine (PLENDIL) 5 MG 24 hr tablet Take 5 mg by mouth daily.   Yes [provider]  gabapentin (NEURONTIN) 100 MG capsule Take 200 mg by mouth at bedtime.   Yes [provider]  hydrALAZINE (APRESOLINE) 25 MG tablet Take 1 tablet by mouth 3 (three) times daily as needed. 06/01/21  Yes [provider]  lactase (LACTAID) 3000 units tablet Take  1 tablet by mouth daily.   Yes [provider]  Multiple Vitamin (MULTIVITAMIN) tablet Take 1 tablet by mouth 2 (two) times daily.   Yes [provider]  olmesartan (BENICAR) 40 MG tablet Take 40 mg by mouth daily.  03/25/18  Yes [provider]  polyethylene glycol (MIRALAX / GLYCOLAX) 17 g packet Take 17 g by mouth daily.   Yes [provider]  Wheat Dextrin (BENEFIBER) CHEW Chew 1 tablet by mouth daily.   Yes [provider]  docusate sodium (COLACE) 100 MG capsule Take 100 mg by mouth daily.  Patient not taking: Reported on 08/18/2021    [provider]  hydrALAZINE (APRESOLINE) 50 MG tablet Take 1 tablet (50 mg total) by mouth every 8 (eight) hours. Patient not taking: Reported on 08/18/2021 09/29/20   Ralene Muskrat B, MD  iron polysaccharides (NIFEREX) 150 MG capsule Take 150 mg by mouth daily. Patient not taking: Reported on 08/18/2021    [provider]  methocarbamol (ROBAXIN) 500 MG tablet Take 500 mg by mouth at bedtime. Patient not taking: Reported on 08/18/2021    [provider]  omeprazole (PRILOSEC) 20 MG capsule Take 20 mg by mouth daily. Patient not taking: Reported on 08/18/2021    [provider]  OVER THE COUNTER MEDICATION Take 1 capsule by mouth daily. **Shaklee  OmegaGuard** Patient not taking: Reported on 08/18/2021    [provider]  OVER THE COUNTER MEDICATION Take 1 capsule by mouth 2 (two) times daily. **Omega Q Plus** Patient not taking: Reported on 08/18/2021    [provider]  OVER THE COUNTER MEDICATION Take 1 tablet by mouth daily. **OptiBiotic** Patient not taking: Reported on 08/18/2021    [provider]  OVER THE COUNTER MEDICATION Take 2 capsules by mouth daily. **Shaklee OsteoMatrix** Patient not taking: Reported on 08/18/2021    [provider]  OVER THE COUNTER MEDICATION Take 1 capsule by mouth daily. **LifeVantage Protandim** Patient not taking: Reported on 08/18/2021    [provider]  OVER THE COUNTER MEDICATION Take 2 tablets by mouth daily with lunch. **Shaklee Blood Pressure** Patient not taking: Reported on 08/18/2021    [provider]   DG Chest Portable 1 View  Result Date: 08/18/2021 CLINICAL DATA:  Fall EXAM: PORTABLE CHEST 1 VIEW COMPARISON:  07/13/2020 FINDINGS: Cardiomegaly. Both lungs are clear. The visualized skeletal structures are unremarkable. IMPRESSION: Cardiomegaly without acute abnormality of the lungs in AP portable projection. Electronically Signed   By: Delanna Ahmadi M.D.   On: 08/18/2021 12:19   DG Wrist Complete Left  Result Date: 08/18/2021 CLINICAL DATA:  Fall, pain EXAM: LEFT WRIST - COMPLETE 3+ VIEW COMPARISON:  None Available. FINDINGS: Osteopenia. Impacted fractures of the distal left radius and ulna which appear to be intra-articular in the radius. Minimal dorsal angulation. The carpus proper is normally aligned. Diffuse soft tissue edema about the wrist. IMPRESSION: 1. Impacted fractures of the distal left radius and ulna, which appear to be intra-articular in the radius. Minimal dorsal angulation. 2.  The carpus proper is normally aligned. 3.  Diffuse soft tissue edema about the wrist. Electronically Signed   By: Delanna Ahmadi M.D.   On: 08/18/2021  12:18   DG Elbow Complete Left  Result Date: 08/18/2021 CLINICAL DATA:  Fall, pain EXAM: LEFT ELBOW - COMPLETE 3+ VIEW COMPARISON:  None Available. FINDINGS: Suspect a subtle impacted fracture of the left radial neck. Associated elbow joint effusion. Moderate elbow joint arthrosis. Soft tissues unremarkable.  IMPRESSION: 1. Suspect a subtle impacted fracture of the left radial neck. Associated elbow joint effusion. 2. Moderate elbow joint arthrosis. Electronically Signed   By: Delanna Ahmadi M.D.   On: 08/18/2021 12:17   DG Shoulder Left  Result Date: 08/18/2021 CLINICAL DATA:  Fall, pain EXAM: LEFT SHOULDER - 2+ VIEW COMPARISON:  None Available. FINDINGS: No fracture or dislocation of the left shoulder. Mild glenohumeral and acromioclavicular arthrosis. Partially imaged left chest is unremarkable. IMPRESSION: No fracture or dislocation of the left shoulder. Mild glenohumeral and acromioclavicular arthrosis. Electronically Signed   By: Delanna Ahmadi M.D.   On: 08/18/2021 12:16   DG Knee Complete 4 Views Left  Result Date: 08/18/2021 CLINICAL DATA:  Fall, knee pain EXAM: LEFT KNEE - COMPLETE 4+ VIEW COMPARISON:  None Available. FINDINGS: No fracture or dislocation of the left knee. Mild tricompartmental joint space narrowing and osteophytosis, worst in the patellofemoral compartment. No knee joint effusion. Vascular calcinosis. IMPRESSION: No fracture or dislocation of the left knee. Mild tricompartmental osteoarthritis, worst in the patellofemoral compartment. Electronically Signed   By: Delanna Ahmadi M.D.   On: 08/18/2021 12:14   DG Hip Unilat W or Wo Pelvis 2-3 Views Left  Result Date: 08/18/2021 CLINICAL DATA:  Fall, pain EXAM: DG HIP (WITH OR WITHOUT PELVIS) 2-3V LEFT COMPARISON:  None Available. FINDINGS: Foreshortened, angulated basicervical fracture of the left femoral neck. Additional minimally displaced fractures of the left superior and inferior pubic rami. No displaced fracture or  dislocation of the right hemipelvis or proximal right femur seen in single frontal view. Calcified uterine fibroids. Nonobstructive pattern of overlying bowel gas. IMPRESSION: 1. Foreshortened, angulated basicervical fracture of the left femoral neck. 2. Additional minimally displaced fractures of the left superior and inferior pubic rami. 3. No displaced fracture or dislocation of the right hemipelvis or proximal right femur seen in single frontal view. Electronically Signed   By: Delanna Ahmadi M.D.   On: 08/18/2021 12:11   DG Lumbar Spine 2-3 Views  Result Date: 08/18/2021 CLINICAL DATA:  Fall, pain EXAM: LUMBAR SPINE - 2-3 VIEW COMPARISON:  MR lumbar spine, 09/01/2019 FINDINGS: Osteopenia. New wedge deformity of the L1 vertebral body with approximately 30% anterior height loss. This appears sclerotic and nonacute although is new compared to most recent imaging of the lumbar spine dated 09/01/2019. No other fracture or dislocation of the lumbar spine. Mild to moderate multilevel disc space height loss and osteophytosis throughout, worst at L4-L5. Nonobstructive pattern of overlying bowel gas. Calcified uterine fibroids. IMPRESSION: 1. New wedge deformity of the L1 vertebral body with approximately 30% anterior height loss. This appears sclerotic and nonacute although is new compared to most recent imaging of the lumbar spine dated 09/01/2019. Correlate with acute point tenderness. MRI may be used to assess for marrow edema and fracture acuity if desired. 2.  Disc degenerative disease of the lumbar spine. Electronically Signed   By: Delanna Ahmadi M.D.   On: 08/18/2021 12:08   DG Thoracic Spine 2 View  Result Date: 08/18/2021 CLINICAL DATA:  Fall, pain EXAM: THORACIC SPINE 2 VIEWS COMPARISON:  None Available. FINDINGS: Osteopenia. No fracture or dislocation of the thoracic spine. Mild multilevel disc space height loss and osteophytosis throughout. Cardiomegaly. No acute abnormality of the included chest.  IMPRESSION: 1. No fracture or dislocation of the thoracic spine. Mild multilevel thoracic disc degenerative disease. 2. Osteopenia. Electronically Signed   By: Delanna Ahmadi M.D.   On: 08/18/2021 12:03   CT HEAD WO CONTRAST (5MM)  Result Date: 08/18/2021 CLINICAL DATA:  Head trauma, minor (Age >= 65y); Neck trauma (Age >= 65y) EXAM: CT HEAD WITHOUT CONTRAST CT CERVICAL SPINE WITHOUT CONTRAST TECHNIQUE: Multidetector CT imaging of the head and cervical spine was performed following the standard protocol without intravenous contrast. Multiplanar CT image reconstructions of the cervical spine were also generated. RADIATION DOSE REDUCTION: This exam was performed according to the departmental dose-optimization program which includes automated exposure control, adjustment of the mA and/or kV according to patient size and/or use of iterative reconstruction technique. COMPARISON:  CTA Head 09/25/2020. FINDINGS: CT HEAD FINDINGS Brain: No evidence of acute infarction, hemorrhage, hydrocephalus, extra-axial collection or mass lesion/mass effect. Remote infarct in the left temporoparietal region. Cerebral atrophy. Vascular: No hyperdense vessel identified. Skull: No acute fracture. Sinuses/Orbits: Left maxillary sinus retention cyst. Otherwise, clear sinuses. No acute orbital findings. Other: No mastoid effusions. CT CERVICAL SPINE FINDINGS Alignment: Normal. Skull base and vertebrae: Similar mild height loss of the C7 vertebral body. No evidence of acute fracture. Soft tissues and spinal canal: No prevertebral fluid or swelling. No visible canal hematoma. Disc levels:  Similar multilevel degenerative change. Upper chest: Visualized lung apices are clear. IMPRESSION: 1. No evidence of acute intracranial abnormality. 2. No evidence of acute fracture or traumatic malalignment the cervical spine. 3. Remote left temporoparietal infarct. Electronically Signed   By: Margaretha Sheffield M.D.   On: 08/18/2021 11:10   CT Cervical  Spine Wo Contrast  Result Date: 08/18/2021 CLINICAL DATA:  Head trauma, minor (Age >= 65y); Neck trauma (Age >= 65y) EXAM: CT HEAD WITHOUT CONTRAST CT CERVICAL SPINE WITHOUT CONTRAST TECHNIQUE: Multidetector CT imaging of the head and cervical spine was performed following the standard protocol without intravenous contrast. Multiplanar CT image reconstructions of the cervical spine were also generated. RADIATION DOSE REDUCTION: This exam was performed according to the departmental dose-optimization program which includes automated exposure control, adjustment of the mA and/or kV according to patient size and/or use of iterative reconstruction technique. COMPARISON:  CTA Head 09/25/2020. FINDINGS: CT HEAD FINDINGS Brain: No evidence of acute infarction, hemorrhage, hydrocephalus, extra-axial collection or mass lesion/mass effect. Remote infarct in the left temporoparietal region. Cerebral atrophy. Vascular: No hyperdense vessel identified. Skull: No acute fracture. Sinuses/Orbits: Left maxillary sinus retention cyst. Otherwise, clear sinuses. No acute orbital findings. Other: No mastoid effusions. CT CERVICAL SPINE FINDINGS Alignment: Normal. Skull base and vertebrae: Similar mild height loss of the C7 vertebral body. No evidence of acute fracture. Soft tissues and spinal canal: No prevertebral fluid or swelling. No visible canal hematoma. Disc levels:  Similar multilevel degenerative change. Upper chest: Visualized lung apices are clear. IMPRESSION: 1. No evidence of acute intracranial abnormality. 2. No evidence of acute fracture or traumatic malalignment the cervical spine. 3. Remote left temporoparietal infarct. Electronically Signed   By: Margaretha Sheffield M.D.   On: 08/18/2021 11:10    Positive ROS: All other systems have been reviewed and were otherwise negative with the exception of those mentioned in the HPI and as above.  Physical Exam: General: Awake, no acute distress  MUSCULOSKELETAL: Left upper  extremity: Patient has a sugar-tong splint.  Patient's fingers are well-perfused.  Her skin was reported to be intact prior to placement of the sugar-tong splint.  Sensation and motor function cannot be assessed.  Left lower extremity: Patient skin is intact overlying the left hip.  Her thigh and leg compartments are soft and compressible.  She has shortening and external rotation of the left lower extremity.  She has  palpable pedal pulses.  Sensation and motor function cannot be assessed.  Assessment: Left femoral neck hip fracture, displaced Left nondisplaced superior and inferior pubic rami fractures Minimally displaced left distal radius and ulna fractures Nondisplaced left radial head fracture.  Plan: I spoke with the patient's brother by phone this evening.  I reviewed the patient's injuries with him.  I have recommended nonoperative treatment for the left distal radius and ulna fractures as well as a radial head fracture.  She will be initially treated in a sugar-tong splint and converted to a short arm cast in the office after discharge.  The radial head fracture may be treated nonoperatively and without immobilization.  The pelvic fractures are nondisplaced and will not require surgical intervention.  I did however explain to the patient's brother that she has a displaced left femoral neck hip fracture and I would recommend treatment with hemiarthroplasty to give her an opportunity to return to standing and potentially walking.  The hemiarthroplasty will also help with pain.  The patient's brother is familiar with the hemiarthroplasty procedure as he had it done last year after a fall.  I reviewed with him the details of the operation as well as the postoperative course.  I discussed the risks and benefits of surgery. The risks include but are not limited to infection, bleeding requiring blood transfusion, nerve or blood vessel injury, joint stiffness or loss of motion, persistent pain,  weakness or instability, leg length discrepancy, change in lower extremity rotation, dislocation, fracture, hardware failure and the need for further surgery. Medical risks include but are not limited to DVT and pulmonary embolism, myocardial infarction, stroke, pneumonia, respiratory failure and death. Patient understood these risks and wished to proceed.  He understands that the patient has been admitted to the medical service for preoperative clearance.  I answered all his questions.  Patient's brother was in favor of proceeding with surgery.  Postoperative physical therapy will be challenging given the patient's dementia.  She will [facility] to [facility] discharge from [facility].  I reviewed the patient's labs and radiographs in preparation for the surgery.  Patient's surgery will be on Thursday morning 08/20/2021.  Patient will be n.p.o. after midnight the night prior to surgery.  Patient was on Plavix prior to admission which has been stopped in preparation for surgery.    Thornton Park, MD    08/18/2021 7:16 PM

## 2021-08-18 NOTE — Assessment & Plan Note (Signed)
-   PT/OT when able to

## 2021-08-19 DIAGNOSIS — S72002A Fracture of unspecified part of neck of left femur, initial encounter for closed fracture: Secondary | ICD-10-CM | POA: Diagnosis not present

## 2021-08-19 LAB — CBC
HCT: 32.8 % — ABNORMAL LOW (ref 36.0–46.0)
Hemoglobin: 11 g/dL — ABNORMAL LOW (ref 12.0–15.0)
MCH: 29.6 pg (ref 26.0–34.0)
MCHC: 33.5 g/dL (ref 30.0–36.0)
MCV: 88.4 fL (ref 80.0–100.0)
Platelets: 351 10*3/uL (ref 150–400)
RBC: 3.71 MIL/uL — ABNORMAL LOW (ref 3.87–5.11)
RDW: 14.2 % (ref 11.5–15.5)
WBC: 14.8 10*3/uL — ABNORMAL HIGH (ref 4.0–10.5)
nRBC: 0 % (ref 0.0–0.2)

## 2021-08-19 LAB — ABO/RH: ABO/RH(D): A POS

## 2021-08-19 LAB — BASIC METABOLIC PANEL
Anion gap: 8 (ref 5–15)
BUN: 31 mg/dL — ABNORMAL HIGH (ref 8–23)
CO2: 21 mmol/L — ABNORMAL LOW (ref 22–32)
Calcium: 8.8 mg/dL — ABNORMAL LOW (ref 8.9–10.3)
Chloride: 110 mmol/L (ref 98–111)
Creatinine, Ser: 1 mg/dL (ref 0.44–1.00)
GFR, Estimated: 56 mL/min — ABNORMAL LOW (ref >60)
Glucose, Bld: 145 mg/dL — ABNORMAL HIGH (ref 70–99)
Potassium: 4.1 mmol/L (ref 3.5–5.1)
Sodium: 139 mmol/L (ref 135–145)

## 2021-08-19 MED ORDER — AMLODIPINE BESYLATE 5 MG PO TABS
5.0000 mg | ORAL_TABLET | Freq: Every day | ORAL | Status: DC
  Administered 2021-08-19: 5 mg via ORAL
  Filled 2021-08-19: qty 1

## 2021-08-19 MED ORDER — AMLODIPINE BESYLATE 5 MG PO TABS
5.0000 mg | ORAL_TABLET | Freq: Once | ORAL | Status: AC
Start: 2021-08-19 — End: 2021-08-19
  Administered 2021-08-19: 5 mg via ORAL
  Filled 2021-08-19: qty 1

## 2021-08-19 MED ORDER — SENNA 8.6 MG PO TABS
1.0000 | ORAL_TABLET | Freq: Every day | ORAL | Status: DC
  Administered 2021-08-19 – 2021-08-22 (×4): 8.6 mg via ORAL
  Filled 2021-08-19 (×4): qty 1

## 2021-08-19 MED ORDER — AMLODIPINE BESYLATE 10 MG PO TABS: 10.0000 mg | ORAL_TABLET | Freq: Every day | ORAL | Status: DC

## 2021-08-19 MED ORDER — ENSURE ENLIVE PO LIQD
237.0000 mL | Freq: Two times a day (BID) | ORAL | Status: DC
  Administered 2021-08-21 – 2021-08-23 (×5): 237 mL via ORAL

## 2021-08-19 MED ORDER — CARVEDILOL 3.125 MG PO TABS
6.2500 mg | ORAL_TABLET | Freq: Two times a day (BID) | ORAL | Status: DC
  Administered 2021-08-19 – 2021-08-21 (×4): 6.25 mg via ORAL
  Filled 2021-08-19 (×4): qty 2

## 2021-08-19 NOTE — Progress Notes (Addendum)
PROGRESS NOTE    Courtney Lynn  OHY:[id number] DOB: October 27, 1940 DOA: 08/18/2021 PCP: Venia Carbon, MD  Coming from: [facility]    Brief Narrative:  From admission h and p Courtney Lynn is a 81 y.o. female with medical history significant of HTN, HLD, stroke, dementia, GERD, depression, OSA on CPAP, mitral valve regurgitation, breast cancer (s/p of right partial mastectomy and radiation therapy), obesity with BMI 34.99, anemia, vertigo, PVC, dementia, mild pulmonary hypertension, PAF not anticoagulants, who presents with fall.   Per report, pt had witnessed fall in facility this AM. No LOC. Pt fell while she was ambulating with a walker when she reached up to scratch herself and she fell from a standing position. She injured her head, left hip and left arm.  She obviously has pain in left hip and left arm, but cannot provide detailed information to characterize her pain.  Patient does not have active nausea, vomiting, diarrhea, respiratory distress cough noted.  Does not seem to have abdominal pain or chest pain.   Per her brother (I called her brother by phone), patient has history of dementia after stroke.  Normally patient is not orientated x3.  Currently patient is alert, mental status seems to be at baseline.   Assessment & Plan:   Principal Problem:   Fracture of femoral neck, left, closed (HCC) Active Problems:   Left wrist fracture   Fracture of radial neck, left, closed   Pubic ramus fracture, left, closed, initial encounter ([place])   Closed compression fracture of body of L1 vertebra ([place])   Fall   PAF (paroxysmal atrial fibrillation) (HCC)   Hypertension   HLD (hyperlipidemia)   Depression   Obesity with body mass index (BMI) of 30.0 to 39.9   Dementia (HCC)   OSA on CPAP   Essential hypertension   Iron deficiency anemia   Leukocytosis   Chronic kidney disease, stage 3a (HCC)   Stroke (HCC)   Fracture of femoral neck, left, closed ([place]) Pubic rami  fractures Ortho following, plan for operative repair of hip tomorrow - Pain control: morphine prn and percocet, lidoderm patch - When necessary Zofran for nausea - Robaxin for muscle spasm - bowel regimen - PT/OT when able to (not ordered now)   Distal radius/ulna fracture, radial head fracture Ortho following. Initial treatment with sugar-tong splint with plan for ortho f/u and eventual short arm cast.   Fall - PT/OT when able to   PAF (paroxysmal atrial fibrillation) (HCC) Heart rate low 100s, hypertensive.  Patient is currently not taking nodal blockers - start coreg  Urinary retention Unable to void this afternoon, bladder scan 329 - I/o cath, continue as needed   Hypertension Bp elevated - cont home felodipine (substitute amlod) - starting coreg as above - resume home olmesartan as needed   HLD (hyperlipidemia) - Lipitor   Depression - Celexa   Obesity with body mass index (BMI) of 30.0 to 39.9   Dementia (HCC) - Donepezil   Stroke ([place]) -hold plavix for surgery -lipitor   Chronic kidney disease, stage 3a (HCC) At baseline   Leukocytosis WBC 23.7, no source of infection identified, likely reactive. Improved to 14.8 today   Perioperative Cardiac Risk: No recent acute cardiac issues.  Patient does not have chest pain, shortness of breath, palpitation, leg edema.  No signs of acute CHF exacerbation currently and has normal TTE within past year.  EKG has no acute change. At this time point, no further work up is  needed. Patient's GUPTA score perioperative myocardial infarction or cardaic arrest is 2.34 %.  admission provider discussed the risk with POA   DVT prophylaxis: SCDs Code Status: dnr Family Communication: brother (and power of attorney) updated telephonically 7/19  Level of care: Telemetry Medical Status is: Inpatient Remains inpatient appropriate because: severity of illness    Consultants:  ortho  Procedures: pending  Antimicrobials:   none    Subjective: Doesn't respond when asked questions  Objective: Vitals:   08/18/21 2011 08/19/21 0326 08/19/21 0757 08/19/21 0842  BP: 97/76 133/74 (!) 166/153 (!) 166/110  Pulse: 94 91 96 (!) 128  Resp: '20 18 16   '$ Temp: 99 F (37.2 C) 99.2 F (37.3 C) (!) 97 F (36.1 C)   TempSrc:      SpO2: 91% 96% 94%   Weight:      Height:        Intake/Output Summary (Last 24 hours) at 08/19/2021 1054 Last data filed at 08/19/2021 0120 Gross per 24 hour  Intake 329.96 ml  Output 225 ml  Net 104.96 ml   Filed Weights   08/18/21 1039  Weight: 84 kg    Examination:  General exam: Appears calm and comfortable, confused Respiratory system: Clear to auscultation. Respiratory effort normal. Cardiovascular system: S1 & S2 heard, irreg irreg, soft systolic murmur Gastrointestinal system: Abdomen is nondistended, soft and nontender. No organomegaly or masses felt. Normal bowel sounds heard. Central nervous system: awake, moving all 4 Extremities: warm, no edema Skin: No rashes, lesions or ulcers Psychiatry: demented, calm    Data Reviewed: I have personally reviewed following labs and imaging studies  CBC: Recent Labs  Lab 08/18/21 1123 08/19/21 0527  WBC 23.7* 14.8*  NEUTROABS 20.4*  --   HGB 11.6* 11.0*  HCT 35.4* 32.8*  MCV 89.4 88.4  PLT 429* 570   Basic Metabolic Panel: Recent Labs  Lab 08/18/21 1123 08/19/21 0527  NA 138 139  K 4.0 4.1  CL 107 110  CO2 22 21*  GLUCOSE 172* 145*  BUN 32* 31*  CREATININE 1.14* 1.00  CALCIUM 9.2 8.8*   GFR: Estimated Creatinine Clearance: 42.7 mL/min (by C-G formula based on SCr of 1 mg/dL). Liver Function Tests: Recent Labs  Lab 08/18/21 1123  AST 32  ALT 28  ALKPHOS 115  BILITOT 0.7  PROT 6.5  ALBUMIN 3.4*   No results for input(s): "LIPASE", "AMYLASE" in the last 168 hours. No results for input(s): "AMMONIA" in the last 168 hours. Coagulation Profile: Recent Labs  Lab 08/18/21 1123  INR 1.1    Cardiac Enzymes: No results for input(s): "CKTOTAL", "CKMB", "CKMBINDEX", "TROPONINI" in the last 168 hours. BNP (last 3 results) No results for input(s): "PROBNP" in the last 8760 hours. HbA1C: No results for input(s): "HGBA1C" in the last 72 hours. CBG: Recent Labs  Lab 08/18/21 1713  GLUCAP 130*   Lipid Profile: No results for input(s): "CHOL", "HDL", "LDLCALC", "TRIG", "CHOLHDL", "LDLDIRECT" in the last 72 hours. Thyroid Function Tests: No results for input(s): "TSH", "T4TOTAL", "FREET4", "T3FREE", "THYROIDAB" in the last 72 hours. Anemia Panel: No results for input(s): "VITAMINB12", "FOLATE", "FERRITIN", "TIBC", "IRON", "RETICCTPCT" in the last 72 hours. Urine analysis:    Component Value Date/Time   COLORURINE YELLOW (A) 08/18/2021 1227   APPEARANCEUR HAZY (A) 08/18/2021 1227   LABSPEC 1.026 08/18/2021 1227   PHURINE 5.0 08/18/2021 1227   GLUCOSEU NEGATIVE 08/18/2021 1227   HGBUR NEGATIVE 08/18/2021 1227   BILIRUBINUR NEGATIVE 08/18/2021 1227   KETONESUR 5 (  A) 08/18/2021 1227   PROTEINUR 30 (A) 08/18/2021 1227   NITRITE NEGATIVE 08/18/2021 1227   LEUKOCYTESUR NEGATIVE 08/18/2021 1227   Sepsis Labs: '@LABRCNTIP'$ (procalcitonin:4,lacticidven:4)  )No results found for this or any previous visit (from the past 240 hour(s)).       Radiology Studies: DG Chest Portable 1 View  Result Date: 08/18/2021 CLINICAL DATA:  Fall EXAM: PORTABLE CHEST 1 VIEW COMPARISON:  07/13/2020 FINDINGS: Cardiomegaly. Both lungs are clear. The visualized skeletal structures are unremarkable. IMPRESSION: Cardiomegaly without acute abnormality of the lungs in AP portable projection. Electronically Signed   By: Delanna Ahmadi M.D.   On: 08/18/2021 12:19   DG Wrist Complete Left  Result Date: 08/18/2021 CLINICAL DATA:  Fall, pain EXAM: LEFT WRIST - COMPLETE 3+ VIEW COMPARISON:  None Available. FINDINGS: Osteopenia. Impacted fractures of the distal left radius and ulna which appear to be  intra-articular in the radius. Minimal dorsal angulation. The carpus proper is normally aligned. Diffuse soft tissue edema about the wrist. IMPRESSION: 1. Impacted fractures of the distal left radius and ulna, which appear to be intra-articular in the radius. Minimal dorsal angulation. 2.  The carpus proper is normally aligned. 3.  Diffuse soft tissue edema about the wrist. Electronically Signed   By: Delanna Ahmadi M.D.   On: 08/18/2021 12:18   DG Elbow Complete Left  Result Date: 08/18/2021 CLINICAL DATA:  Fall, pain EXAM: LEFT ELBOW - COMPLETE 3+ VIEW COMPARISON:  None Available. FINDINGS: Suspect a subtle impacted fracture of the left radial neck. Associated elbow joint effusion. Moderate elbow joint arthrosis. Soft tissues unremarkable. IMPRESSION: 1. Suspect a subtle impacted fracture of the left radial neck. Associated elbow joint effusion. 2. Moderate elbow joint arthrosis. Electronically Signed   By: Delanna Ahmadi M.D.   On: 08/18/2021 12:17   DG Shoulder Left  Result Date: 08/18/2021 CLINICAL DATA:  Fall, pain EXAM: LEFT SHOULDER - 2+ VIEW COMPARISON:  None Available. FINDINGS: No fracture or dislocation of the left shoulder. Mild glenohumeral and acromioclavicular arthrosis. Partially imaged left chest is unremarkable. IMPRESSION: No fracture or dislocation of the left shoulder. Mild glenohumeral and acromioclavicular arthrosis. Electronically Signed   By: Delanna Ahmadi M.D.   On: 08/18/2021 12:16   DG Knee Complete 4 Views Left  Result Date: 08/18/2021 CLINICAL DATA:  Fall, knee pain EXAM: LEFT KNEE - COMPLETE 4+ VIEW COMPARISON:  None Available. FINDINGS: No fracture or dislocation of the left knee. Mild tricompartmental joint space narrowing and osteophytosis, worst in the patellofemoral compartment. No knee joint effusion. Vascular calcinosis. IMPRESSION: No fracture or dislocation of the left knee. Mild tricompartmental osteoarthritis, worst in the patellofemoral compartment.  Electronically Signed   By: Delanna Ahmadi M.D.   On: 08/18/2021 12:14   DG Hip Unilat W or Wo Pelvis 2-3 Views Left  Result Date: 08/18/2021 CLINICAL DATA:  Fall, pain EXAM: DG HIP (WITH OR WITHOUT PELVIS) 2-3V LEFT COMPARISON:  None Available. FINDINGS: Foreshortened, angulated basicervical fracture of the left femoral neck. Additional minimally displaced fractures of the left superior and inferior pubic rami. No displaced fracture or dislocation of the right hemipelvis or proximal right femur seen in single frontal view. Calcified uterine fibroids. Nonobstructive pattern of overlying bowel gas. IMPRESSION: 1. Foreshortened, angulated basicervical fracture of the left femoral neck. 2. Additional minimally displaced fractures of the left superior and inferior pubic rami. 3. No displaced fracture or dislocation of the right hemipelvis or proximal right femur seen in single frontal view. Electronically Signed   By: Lanae Crumbly  Laqueta Carina M.D.   On: 08/18/2021 12:11   DG Lumbar Spine 2-3 Views  Result Date: 08/18/2021 CLINICAL DATA:  Fall, pain EXAM: LUMBAR SPINE - 2-3 VIEW COMPARISON:  MR lumbar spine, 09/01/2019 FINDINGS: Osteopenia. New wedge deformity of the L1 vertebral body with approximately 30% anterior height loss. This appears sclerotic and nonacute although is new compared to most recent imaging of the lumbar spine dated 09/01/2019. No other fracture or dislocation of the lumbar spine. Mild to moderate multilevel disc space height loss and osteophytosis throughout, worst at L4-L5. Nonobstructive pattern of overlying bowel gas. Calcified uterine fibroids. IMPRESSION: 1. New wedge deformity of the L1 vertebral body with approximately 30% anterior height loss. This appears sclerotic and nonacute although is new compared to most recent imaging of the lumbar spine dated 09/01/2019. Correlate with acute point tenderness. MRI may be used to assess for marrow edema and fracture acuity if desired. 2.  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 Electronically Signed   By: Delanna Ahmadi M.D.   On: 08/18/2021 12:08   DG Thoracic Spine 2 View  Result Date: 08/18/2021 CLINICAL DATA:  Fall, pain EXAM: THORACIC SPINE 2 VIEWS COMPARISON:  None Available. FINDINGS: Osteopenia. No fracture or dislocation of the thoracic spine. Mild multilevel disc space height loss and osteophytosis throughout. Cardiomegaly. No acute abnormality of the included chest. IMPRESSION: 1. No fracture or dislocation of the thoracic spine. Mild multilevel thoracic disc degenerative disease. 2. Osteopenia. Electronically Signed   By: Delanna Ahmadi M.D.   On: 08/18/2021 12:03   CT HEAD WO CONTRAST (5MM)  Result Date: 08/18/2021 CLINICAL DATA:  Head trauma, minor (Age >= 65y); Neck trauma (Age >= 65y) EXAM: CT HEAD WITHOUT CONTRAST CT CERVICAL SPINE WITHOUT CONTRAST TECHNIQUE: Multidetector CT imaging of the head and cervical spine was performed following the standard protocol without intravenous contrast. Multiplanar CT image reconstructions of the cervical spine were also generated. RADIATION DOSE REDUCTION: This exam was performed according to the departmental dose-optimization program which includes automated exposure control, adjustment of the mA and/or kV according to patient size and/or use of iterative reconstruction technique. COMPARISON:  CTA Head 09/25/2020. FINDINGS: CT HEAD FINDINGS Brain: No evidence of acute infarction, hemorrhage, hydrocephalus, extra-axial collection or mass lesion/mass effect. Remote infarct in the left temporoparietal region. Cerebral atrophy. Vascular: No hyperdense vessel identified. Skull: No acute fracture. Sinuses/Orbits: Left maxillary sinus retention cyst. Otherwise, clear sinuses. No acute orbital findings. Other: No mastoid effusions. CT CERVICAL SPINE FINDINGS Alignment: Normal. Skull base and vertebrae: Similar mild height loss of the C7 vertebral body. No evidence of acute fracture. Soft tissues  and spinal canal: No prevertebral fluid or swelling. No visible canal hematoma. Disc levels:  Similar multilevel degenerative change. Upper chest: Visualized lung apices are clear. IMPRESSION: 1. No evidence of acute intracranial abnormality. 2. No evidence of acute fracture or traumatic malalignment the cervical spine. 3. Remote left temporoparietal infarct. Electronically Signed   By: Margaretha Sheffield M.D.   On: 08/18/2021 11:10   CT Cervical Spine Wo Contrast  Result Date: 08/18/2021 CLINICAL DATA:  Head trauma, minor (Age >= 65y); Neck trauma (Age >= 65y) EXAM: CT HEAD WITHOUT CONTRAST CT CERVICAL SPINE WITHOUT CONTRAST TECHNIQUE: Multidetector CT imaging of the head and cervical spine was performed following the standard protocol without intravenous contrast. Multiplanar CT image reconstructions of the cervical spine were also generated. RADIATION DOSE REDUCTION: This exam was performed according to the departmental dose-optimization program which includes automated exposure control, adjustment of the mA  and/or kV according to patient size and/or use of iterative reconstruction technique. COMPARISON:  CTA Head 09/25/2020. FINDINGS: CT HEAD FINDINGS Brain: No evidence of acute infarction, hemorrhage, hydrocephalus, extra-axial collection or mass lesion/mass effect. Remote infarct in the left temporoparietal region. Cerebral atrophy. Vascular: No hyperdense vessel identified. Skull: No acute fracture. Sinuses/Orbits: Left maxillary sinus retention cyst. Otherwise, clear sinuses. No acute orbital findings. Other: No mastoid effusions. CT CERVICAL SPINE FINDINGS Alignment: Normal. Skull base and vertebrae: Similar mild height loss of the C7 vertebral body. No evidence of acute fracture. Soft tissues and spinal canal: No prevertebral fluid or swelling. No visible canal hematoma. Disc levels:  Similar multilevel degenerative change. Upper chest: Visualized lung apices are clear. IMPRESSION: 1. No evidence of  acute intracranial abnormality. 2. No evidence of acute fracture or traumatic malalignment the cervical spine. 3. Remote left temporoparietal infarct. Electronically Signed   By: Margaretha Sheffield M.D.   On: 08/18/2021 11:10        Scheduled Meds:  amLODipine  5 mg Oral Daily   atorvastatin  80 mg Oral Daily   citalopram  10 mg Oral QODAY   donepezil  10 mg Oral QHS   famotidine  20 mg Oral Daily   gabapentin  200 mg Oral QHS   lactase  1 tablet Oral Daily   lidocaine  1 patch Transdermal Q24H   multivitamin with minerals  1 tablet Oral BID   polyethylene glycol  17 g Oral Daily   Continuous Infusions:  [START ON 08/20/2021] vancomycin       LOS: 1 day     Desma Maxim, MD [facility]   If 7PM-7AM, please contact night-coverage [email or web address] Password [facility] 08/19/2021, 10:54 AM

## 2021-08-19 NOTE — Progress Notes (Signed)
Subjective:  The patient is seen in her room today.  She has sleeping comfortably in bed.  Patient is ambulatory at baseline but has suffered a stroke and has significant dementia.  She fell while using her walker at [facility] and has been diagnosed with a left displaced femoral neck hip fracture.  I contacted her brother yesterday to describe the fracture and proposed a left hip hemiarthroplasty as definitive treatment.  He was in agreement with this plan.  Patient is unable to communicate or provide a history.  Objective:   VITALS:   Vitals:   08/19/21 0326 08/19/21 0757 08/19/21 0842 08/19/21 1302  BP: 133/74 (!) 166/153 (!) 166/110 (!) 161/110  Pulse: 91 96 (!) 128 (!) 106  Resp: '18 16  16  '$ Temp: 99.2 F (37.3 C) (!) 97 F (36.1 C)  99 F (37.2 C)  TempSrc:      SpO2: 96% 94%  94%  Weight:      Height:        PHYSICAL EXAM: Left lower extremity: Patient is unable to cooperate with exam due to dementia. Patient skin is intact.  She has a Lidoderm patch over her left hip.  Her thigh and leg compartments are soft and compressible.  She has palpable pedal pulses and has spontaneous movements to her left toes including dorsiflexion and plantarflexion.  LABS  Results for orders placed or performed during the hospital encounter of 08/18/21 (from the past 24 hour(s))  Type and screen [place]     Status: None   Collection Time: 08/18/21  4:30 PM  Result Value Ref Range   ABO/RH(D) A POS    Antibody Screen NEG    Sample Expiration      08/21/2021,2359 Performed at [facility], [address] [place], [place] [address]   Glucose, capillary     Status: Abnormal   Collection Time: 08/18/21  5:13 PM  Result Value Ref Range   Glucose-Capillary 130 (H) 70 - 99 mg/dL  CBC     Status: Abnormal   Collection Time: 08/19/21  5:27 AM  Result Value Ref Range   WBC 14.8 (H) 4.0 - 10.5 K/uL   RBC 3.71 (L) 3.87 - 5.11 MIL/uL   Hemoglobin 11.0  (L) 12.0 - 15.0 g/dL   HCT 32.8 (L) 36.0 - 46.0 %   MCV 88.4 80.0 - 100.0 fL   MCH 29.6 26.0 - 34.0 pg   MCHC 33.5 30.0 - 36.0 g/dL   RDW 14.2 11.5 - 15.5 %   Platelets 351 150 - 400 K/uL   nRBC 0.0 0.0 - 0.2 %  Basic metabolic panel     Status: Abnormal   Collection Time: 08/19/21  5:27 AM  Result Value Ref Range   Sodium 139 135 - 145 mmol/L   Potassium 4.1 3.5 - 5.1 mmol/L   Chloride 110 98 - 111 mmol/L   CO2 21 (L) 22 - 32 mmol/L   Glucose, Bld 145 (H) 70 - 99 mg/dL   BUN 31 (H) 8 - 23 mg/dL   Creatinine, Ser 1.00 0.44 - 1.00 mg/dL   Calcium 8.8 (L) 8.9 - 10.3 mg/dL   GFR, Estimated 56 (L) >60 mL/min   Anion gap 8 5 - 15  ABO/Rh     Status: None   Collection Time: 08/19/21  5:27 AM  Result Value Ref Range   ABO/RH(D)      A POS Performed at [facility], [address] [place], [place] [address]     DG Chest Portable 1 View  Result Date: 08/18/2021 CLINICAL DATA:  Fall EXAM: PORTABLE CHEST 1 VIEW COMPARISON:  07/13/2020 FINDINGS: Cardiomegaly. Both lungs are clear. The visualized skeletal structures are unremarkable. IMPRESSION: Cardiomegaly without acute abnormality of the lungs in AP portable projection. Electronically Signed   By: Delanna Ahmadi M.D.   On: 08/18/2021 12:19   DG Wrist Complete Left  Result Date: 08/18/2021 CLINICAL DATA:  Fall, pain EXAM: LEFT WRIST - COMPLETE 3+ VIEW COMPARISON:  None Available. FINDINGS: Osteopenia. Impacted fractures of the distal left radius and ulna which appear to be intra-articular in the radius. Minimal dorsal angulation. The carpus proper is normally aligned. Diffuse soft tissue edema about the wrist. IMPRESSION: 1. Impacted fractures of the distal left radius and ulna, which appear to be intra-articular in the radius. Minimal dorsal angulation. 2.  The carpus proper is normally aligned. 3.  Diffuse soft tissue edema about the wrist. Electronically Signed   By: Delanna Ahmadi M.D.   On: 08/18/2021 12:18   DG Elbow  Complete Left  Result Date: 08/18/2021 CLINICAL DATA:  Fall, pain EXAM: LEFT ELBOW - COMPLETE 3+ VIEW COMPARISON:  None Available. FINDINGS: Suspect a subtle impacted fracture of the left radial neck. Associated elbow joint effusion. Moderate elbow joint arthrosis. Soft tissues unremarkable. IMPRESSION: 1. Suspect a subtle impacted fracture of the left radial neck. Associated elbow joint effusion. 2. Moderate elbow joint arthrosis. Electronically Signed   By: Delanna Ahmadi M.D.   On: 08/18/2021 12:17   DG Shoulder Left  Result Date: 08/18/2021 CLINICAL DATA:  Fall, pain EXAM: LEFT SHOULDER - 2+ VIEW COMPARISON:  None Available. FINDINGS: No fracture or dislocation of the left shoulder. Mild glenohumeral and acromioclavicular arthrosis. Partially imaged left chest is unremarkable. IMPRESSION: No fracture or dislocation of the left shoulder. Mild glenohumeral and acromioclavicular arthrosis. Electronically Signed   By: Delanna Ahmadi M.D.   On: 08/18/2021 12:16   DG Knee Complete 4 Views Left  Result Date: 08/18/2021 CLINICAL DATA:  Fall, knee pain EXAM: LEFT KNEE - COMPLETE 4+ VIEW COMPARISON:  None Available. FINDINGS: No fracture or dislocation of the left knee. Mild tricompartmental joint space narrowing and osteophytosis, worst in the patellofemoral compartment. No knee joint effusion. Vascular calcinosis. IMPRESSION: No fracture or dislocation of the left knee. Mild tricompartmental osteoarthritis, worst in the patellofemoral compartment. Electronically Signed   By: Delanna Ahmadi M.D.   On: 08/18/2021 12:14   DG Hip Unilat W or Wo Pelvis 2-3 Views Left  Result Date: 08/18/2021 CLINICAL DATA:  Fall, pain EXAM: DG HIP (WITH OR WITHOUT PELVIS) 2-3V LEFT COMPARISON:  None Available. FINDINGS: Foreshortened, angulated basicervical fracture of the left femoral neck. Additional minimally displaced fractures of the left superior and inferior pubic rami. No displaced fracture or dislocation of the right  hemipelvis or proximal right femur seen in single frontal view. Calcified uterine fibroids. Nonobstructive pattern of overlying bowel gas. IMPRESSION: 1. Foreshortened, angulated basicervical fracture of the left femoral neck. 2. Additional minimally displaced fractures of the left superior and inferior pubic rami. 3. No displaced fracture or dislocation of the right hemipelvis or proximal right femur seen in single frontal view. Electronically Signed   By: Delanna Ahmadi M.D.   On: 08/18/2021 12:11   DG Lumbar Spine 2-3 Views  Result Date: 08/18/2021 CLINICAL DATA:  Fall, pain EXAM: LUMBAR SPINE - 2-3 VIEW COMPARISON:  MR lumbar spine, 09/01/2019 FINDINGS: Osteopenia. New wedge deformity of the  L1 vertebral body with approximately 30% anterior height loss. This appears sclerotic and nonacute although is new compared to most recent imaging of the lumbar spine dated 09/01/2019. No other fracture or dislocation of the lumbar spine. Mild to moderate multilevel disc space height loss and osteophytosis throughout, worst at L4-L5. Nonobstructive pattern of overlying bowel gas. Calcified uterine fibroids. IMPRESSION: 1. New wedge deformity of the L1 vertebral body with approximately 30% anterior height loss. This appears sclerotic and nonacute although is new compared to most recent imaging of the lumbar spine dated 09/01/2019. Correlate with acute point tenderness. MRI may be used to assess for marrow edema and fracture acuity if desired. 2.  Disc degenerative disease of the lumbar spine. Electronically Signed   By: Delanna Ahmadi M.D.   On: 08/18/2021 12:08   DG Thoracic Spine 2 View  Result Date: 08/18/2021 CLINICAL DATA:  Fall, pain EXAM: THORACIC SPINE 2 VIEWS COMPARISON:  None Available. FINDINGS: Osteopenia. No fracture or dislocation of the thoracic spine. Mild multilevel disc space height loss and osteophytosis throughout. Cardiomegaly. No acute abnormality of the included chest. IMPRESSION: 1. No fracture  or dislocation of the thoracic spine. Mild multilevel thoracic disc degenerative disease. 2. Osteopenia. Electronically Signed   By: Delanna Ahmadi M.D.   On: 08/18/2021 12:03   CT HEAD WO CONTRAST (5MM)  Result Date: 08/18/2021 CLINICAL DATA:  Head trauma, minor (Age >= 65y); Neck trauma (Age >= 65y) EXAM: CT HEAD WITHOUT CONTRAST CT CERVICAL SPINE WITHOUT CONTRAST TECHNIQUE: Multidetector CT imaging of the head and cervical spine was performed following the standard protocol without intravenous contrast. Multiplanar CT image reconstructions of the cervical spine were also generated. RADIATION DOSE REDUCTION: This exam was performed according to the departmental dose-optimization program which includes automated exposure control, adjustment of the mA and/or kV according to patient size and/or use of iterative reconstruction technique. COMPARISON:  CTA Head 09/25/2020. FINDINGS: CT HEAD FINDINGS Brain: No evidence of acute infarction, hemorrhage, hydrocephalus, extra-axial collection or mass lesion/mass effect. Remote infarct in the left temporoparietal region. Cerebral atrophy. Vascular: No hyperdense vessel identified. Skull: No acute fracture. Sinuses/Orbits: Left maxillary sinus retention cyst. Otherwise, clear sinuses. No acute orbital findings. Other: No mastoid effusions. CT CERVICAL SPINE FINDINGS Alignment: Normal. Skull base and vertebrae: Similar mild height loss of the C7 vertebral body. No evidence of acute fracture. Soft tissues and spinal canal: No prevertebral fluid or swelling. No visible canal hematoma. Disc levels:  Similar multilevel degenerative change. Upper chest: Visualized lung apices are clear. IMPRESSION: 1. No evidence of acute intracranial abnormality. 2. No evidence of acute fracture or traumatic malalignment the cervical spine. 3. Remote left temporoparietal infarct. Electronically Signed   By: Margaretha Sheffield M.D.   On: 08/18/2021 11:10   CT Cervical Spine Wo Contrast  Result  Date: 08/18/2021 CLINICAL DATA:  Head trauma, minor (Age >= 65y); Neck trauma (Age >= 65y) EXAM: CT HEAD WITHOUT CONTRAST CT CERVICAL SPINE WITHOUT CONTRAST TECHNIQUE: Multidetector CT imaging of the head and cervical spine was performed following the standard protocol without intravenous contrast. Multiplanar CT image reconstructions of the cervical spine were also generated. RADIATION DOSE REDUCTION: This exam was performed according to the departmental dose-optimization program which includes automated exposure control, adjustment of the mA and/or kV according to patient size and/or use of iterative reconstruction technique. COMPARISON:  CTA Head 09/25/2020. FINDINGS: CT HEAD FINDINGS Brain: No evidence of acute infarction, hemorrhage, hydrocephalus, extra-axial collection or mass lesion/mass effect. Remote infarct in the left temporoparietal region.  Cerebral atrophy. Vascular: No hyperdense vessel identified. Skull: No acute fracture. Sinuses/Orbits: Left maxillary sinus retention cyst. Otherwise, clear sinuses. No acute orbital findings. Other: No mastoid effusions. CT CERVICAL SPINE FINDINGS Alignment: Normal. Skull base and vertebrae: Similar mild height loss of the C7 vertebral body. No evidence of acute fracture. Soft tissues and spinal canal: No prevertebral fluid or swelling. No visible canal hematoma. Disc levels:  Similar multilevel degenerative change. Upper chest: Visualized lung apices are clear. IMPRESSION: 1. No evidence of acute intracranial abnormality. 2. No evidence of acute fracture or traumatic malalignment the cervical spine. 3. Remote left temporoparietal infarct. Electronically Signed   By: Margaretha Sheffield M.D.   On: 08/18/2021 11:10    Assessment/Plan:     Principal Problem:   Fracture of femoral neck, left, closed ([place]) Active Problems:   Hypertension   Essential hypertension   OSA on CPAP   Dementia ([place])   Fall   Left wrist fracture   Pubic ramus fracture, left, closed,  initial encounter ([place])   Closed compression fracture of body of L1 vertebra (HCC)   PAF (paroxysmal atrial fibrillation) (HCC)   Depression   HLD (hyperlipidemia)   Obesity with body mass index (BMI) of 30.0 to 39.9   Iron deficiency anemia   Leukocytosis   Chronic kidney disease, stage 3a (HCC)   Fracture of radial neck, left, closed   Stroke [facility])  Patient is scheduled for left hip hemiarthroplasty tomorrow to help with pain control and provided opportunity to return to ambulation.  Patient has had a stroke and advanced dementia.  Patient is ambulatory at baseline.  Patient has been cleared for surgery.  Patient's Plavix is on hold.  Patient will be n.p.o. after midnight.  She is ordered for vancomycin for preoperative antibiotics given his significant allergy to penicillin.  Patient's brother was in agreement with the plan for surgery.  He is familiar with the procedure having been through it himself last year.  I have answered all his questions and explained the risk and benefits of the procedure with him.    Thornton Park , MD 08/19/2021, 3:24 PM

## 2021-08-19 NOTE — Plan of Care (Signed)
  Problem: Clinical Measurements: Goal: Will remain free from infection Outcome: Progressing Goal: Diagnostic test results will improve Outcome: Progressing   Problem: Pain Managment: Goal: General experience of comfort will improve Outcome: Progressing   Problem: Safety: Goal: Ability to remain free from injury will improve Outcome: Progressing   Problem: Skin Integrity: Goal: Risk for impaired skin integrity will decrease Outcome: Progressing

## 2021-08-19 NOTE — Progress Notes (Signed)
Initial Nutrition Assessment  DOCUMENTATION CODES:   Obesity unspecified  INTERVENTION:   -Ensure Enlive po BID, each supplement provides 350 kcal and 20 grams of protein -MVI with minerals daily -Feeding assistance with meals  NUTRITION DIAGNOSIS:   Increased nutrient needs related to post-op healing as evidenced by estimated needs.  GOAL:   Patient will meet greater than or equal to 90% of their needs  MONITOR:   PO intake, Supplement acceptance  REASON FOR ASSESSMENT:   Consult Assessment of nutrition requirement/status, Hip fracture protocol  ASSESSMENT:   Pt with medical history significant of HTN, HLD, stroke, dementia, GERD, depression, OSA on CPAP, mitral valve regurgitation, breast cancer (s/p of right partial mastectomy and radiation therapy), obesity with BMI 34.99, anemia, vertigo, PVC, dementia, mild pulmonary hypertension, PAF not anticoagulants, who presents with fall.  Pt admitted with lt femoral neck fracture s/p fall.   7/19- s/p BSE- continue to recommend dysphagia 2 diet with thin liquids  Reviewed I/O's: +105 ml x 24 hours  UOP: 225 ml x 24 hours  Per orthopedics notes, plan for hemiarthroplasty on Thursday.    Pt resting in bed at time of visit. Pt did not respond to voice or touch. Noted breakfast tray on tray table untouched.  Reviewed wt hx; wt has been stable.   Pt with increased nutritional needs for post-op healing and would benefit from addition of oral nutrition supplements.   Medications reviewed and include senokot and miralax.   Lab Results  Component Value Date   HGBA1C 5.3 09/25/2020   PTA DM medications are none.   Labs reviewed: CBGS: 130 (inpatient orders for glycemic control are none).    NUTRITION - FOCUSED PHYSICAL EXAM:  Flowsheet Row Most Recent Value  Orbital Region No depletion  Upper Arm Region No depletion  Thoracic and Lumbar Region No depletion  Buccal Region No depletion  Temple Region No depletion   Clavicle Bone Region No depletion  Clavicle and Acromion Bone Region No depletion  Scapular Bone Region No depletion  Dorsal Hand No depletion  Patellar Region No depletion  Anterior Thigh Region No depletion  Posterior Calf Region No depletion  Edema (RD Assessment) Mild  Hair Reviewed  Eyes Reviewed  Mouth Reviewed  Skin Reviewed  Nails Reviewed       Diet Order:   Diet Order             Diet NPO time specified Except for: Sips with Meds  Diet effective midnight           DIET DYS 2 Room service appropriate? Yes; Fluid consistency: Thin  Diet effective now                   EDUCATION NEEDS:   No education needs have been identified at this time  Skin:  Skin Assessment: Reviewed RN Assessment  Last BM:  Unknown  Height:   Ht Readings from Last 1 Encounters:  08/18/21 '5\' 1"'$  (1.549 m)    Weight:   Wt Readings from Last 1 Encounters:  08/18/21 84 kg    Ideal Body Weight:  47.7 kg  BMI:  Body mass index is 34.99 kg/m.  Estimated Nutritional Needs:   Kcal:  1500-1700  Protein:  80-95 grams  Fluid:  > 1.5 L    Loistine Chance, RD, LDN, [place] Registered Dietitian II Certified Diabetes Care and Education Specialist Please refer to [facility] for RD and/or RD on-call/weekend/after hours pager

## 2021-08-19 NOTE — Anesthesia Preprocedure Evaluation (Addendum)
Anesthesia Evaluation  Patient identified by MRN, date of birth, ID band Patient awake    Reviewed: Allergy & Precautions, NPO status , Patient's Chart, lab work & pertinent test results  History of Anesthesia Complications (+) DIFFICULT AIRWAY and history of anesthetic complications  Airway Mallampati: IV  TM Distance: <3 FB Neck ROM: full    Dental  (+) Chipped, Implants, Poor Dentition, Missing   Pulmonary neg pulmonary ROS, sleep apnea , former smoker,    Pulmonary exam normal        Cardiovascular Exercise Tolerance: Good hypertension, negative cardio ROS  + dysrhythmias Atrial Fibrillation      Neuro/Psych PSYCHIATRIC DISORDERS Dementia  Neuromuscular disease CVA, Residual Symptoms negative neurological ROS  negative psych ROS   GI/Hepatic negative GI ROS, Neg liver ROS, GERD  Controlled,  Endo/Other  negative endocrine ROS  Renal/GU Renal disease     Musculoskeletal   Abdominal   Peds  Hematology negative hematology ROS (+)   Anesthesia Other Findings Past Medical History: No date: Anemia No date: Arthritis No date: Body mass index (bmi) 37.0-37.9, adult 01/02/2016: Breast cancer ([place])     Comment:  right breast/radiation 2017: Cancer ([place])     Comment:  BREAST No date: Chronic insomnia No date: Complication of anesthesia     Comment:  has difficulty last surgery intubating No date: Depression No date: Difficult intubation     Comment:  WITH TKR No date: Dizziness No date: Dysrhythmia     Comment:  PVC'S,BRADYCARDIA No date: GERD (gastroesophageal reflux disease) No date: History of chicken pox No date: Hypertension No date: Hyponatremia No date: Intermittent vertigo No date: Irregular heart beat No date: Memory difficulties No date: Mild mitral regurgitation No date: 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HCC) No date: MVP (mitral valve prolapse) No date: Orthopnea 2017: Personal history of  radiation therapy No date: Pre-diabetes No date: PVC's (premature ventricular contractions) No date: Reflux No date: Sleep apnea     Comment:  CPAP  Past Surgical History: No date: APPENDECTOMY 01/02/2016: BREAST BIOPSY; Right     Comment:  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 2017: BREAST LUMPECTOMY; Right 09/14/2016: CATARACT EXTRACTION W/PHACO; Right     Comment:  Procedure: CATARACT EXTRACTION PHACO AND INTRAOCULAR               LENS PLACEMENT ([place]);  Surgeon: Birder Robson, MD;                Location: [facility] ORS;  Service: Ophthalmology;  Laterality:              Right;  [place] 00:32 AP% 19.5 CDE 6.28 Fluid pack lot #               [id number] 10/05/2016: CATARACT EXTRACTION W/PHACO; Left     Comment:  Procedure: CATARACT EXTRACTION PHACO AND INTRAOCULAR               LENS PLACEMENT ([facility]);  Surgeon: Birder Robson, MD;                Location: [facility] ORS;  Service: Ophthalmology;  Laterality:              Left;  [place] 00:46.9 AP% 16.0 CDE 7.48 Fluid Pack lot #               [id number] No date: CHOLECYSTECTOMY 08/24/2016: COLONOSCOPY WITH PROPOFOL; N/A     Comment:  Procedure: COLONOSCOPY WITH PROPOFOL;  Surgeon:  Lollie Sails, MD;  Location: [facility] ENDOSCOPY;                Service: Endoscopy;  Laterality: N/A; 07/12/2018: COLONOSCOPY WITH PROPOFOL; N/A     Comment:  Procedure: COLONOSCOPY WITH PROPOFOL;  Surgeon: [place],               Benay Pike, MD;  Location: [facility] ENDOSCOPY;  Service:               Gastroenterology;  Laterality: N/A; No date: dental implants No date: FOOT SURGERY; Right No date: FRACTURE SURGERY     Comment:  KNEE CAP 04/19/2019: HEMORRHOID SURGERY; N/A     Comment:  Procedure: HEMORRHOIDECTOMY;  Surgeon: Benjamine Sprague, DO;              Location: [facility] ORS;  Service: General;  Laterality: N/A; 2011: JOINT REPLACEMENT; Right     Comment:  KNEE 01/02/2016: PARTIAL MASTECTOMY WITH NEEDLE LOCALIZATION; Right     Comment:  Procedure: PARTIAL MASTECTOMY  WITH NEEDLE LOCALIZATION;               Surgeon: Leonie Green, MD;  Location: [facility] ORS;                Service: General;  Laterality: Right; No date: PATELLA FRACTURE SURGERY No date: REPLACEMENT TOTAL KNEE; Right 01/02/2016: SENTINEL NODE BIOPSY; Right     Comment:  Procedure: SENTINEL NODE BIOPSY;  Surgeon: Leonie Green, MD;  Location: [facility] ORS;  Service: General;                Laterality: Right; No date: TONSILLECTOMY  BMI    Body Mass Index: 34.99 kg/m      Reproductive/Obstetrics negative OB ROS                            Anesthesia Physical Anesthesia Plan  ASA: 3  Anesthesia Plan: General ETT   Post-op Pain Management:    Induction: Intravenous  PONV Risk Score and Plan: 3 and Ondansetron, Dexamethasone, Midazolam and Treatment may vary due to age or medical condition  Airway Management Planned: Oral ETT  Additional Equipment:   Intra-op Plan:   Post-operative Plan: Extubation in OR  Informed Consent: I have reviewed the patients History and Physical, chart, labs and discussed the procedure including the risks, benefits and alternatives for the proposed anesthesia with the patient or authorized representative who has indicated his/her understanding and acceptance.   Patient has DNR.  Discussed DNR with power of attorney and Suspend DNR.   Dental Advisory Given  Plan Discussed with: Anesthesiologist, CRNA and Surgeon  Anesthesia Plan Comments: (History and phone consent from the patients brother Gloris Ham [phone]   Brother consented for risks of anesthesia including but not limited to:  - adverse reactions to medications - damage to eyes, teeth, lips or other oral mucosa - nerve damage due to positioning  - sore throat or hoarseness - Damage to heart, brain, nerves, lungs, other parts of body or loss of life  He voiced understanding.)      Anesthesia Quick Evaluation

## 2021-08-19 NOTE — Plan of Care (Signed)

## 2021-08-19 NOTE — Progress Notes (Signed)
Patient is not voiding since admitted in the floors, bladder scan done 195 ml of urine recorded, Dr. Sidney Ace notified, in and out catheter ordered.

## 2021-08-19 NOTE — Evaluation (Addendum)
Clinical/Bedside Swallow Evaluation Patient Details  Name: JAELYNE DEEG MRN: [id number] Date of Birth: [birth year]  Today's Date: 08/19/2021 Time: SLP Start Time (ACUTE ONLY): 12 SLP Stop Time (ACUTE ONLY): 5809 SLP Time Calculation (min) (ACUTE ONLY): 15 min  Past Medical History:  Past Medical History:  Diagnosis Date   Anemia    Arthritis    Body mass index (bmi) 37.0-37.9, adult    Breast cancer ([place]) 01/02/2016   right breast/radiation   Cancer ([place]) 2017   BREAST   Chronic insomnia    Complication of anesthesia    has difficulty last surgery intubating   Depression    Difficult intubation    WITH TKR   Dizziness    Dysrhythmia    PVC'S,BRADYCARDIA   GERD (gastroesophageal reflux disease)    History of chicken pox    Hypertension    Hyponatremia    Intermittent vertigo    Irregular heart beat    Memory difficulties    Mild mitral regurgitation    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HCC)    MVP (mitral valve prolapse)    Orthopnea    Personal history of radiation therapy 2017   Pre-diabetes    PVC's (premature ventricular contractions)    Reflux    Sleep apnea    CPAP   Past Surgical History:  Past Surgical History:  Procedure Laterality Date   APPENDECTOMY     BREAST BIOPSY Right 01/02/2016   PARTIAL [facility]STECTOMY- [facility] and [facility]   BREAST LUMPECTOMY Right 2017   CATARACT EXTRACTION W/PHACO Right 09/14/2016   Procedure: CATARACT EXTRACTION PHACO AND INTRAOCULAR LENS PLACEMENT ([place]);  Surgeon: Birder Robson, MD;  Location: [facility] ORS;  Service: Ophthalmology;  Laterality: Right;  [place] 00:32 AP% 19.5 CDE 6.28 Fluid pack lot # [id number]   CATARACT EXTRACTION W/PHACO Left 10/05/2016   Procedure: CATARACT EXTRACTION PHACO AND INTRAOCULAR LENS PLACEMENT ([facility]);  Surgeon: Birder Robson, MD;  Location: [facility] ORS;  Service: Ophthalmology;  Laterality: Left;  [place] 00:46.9 AP% 16.0 CDE 7.48 Fluid Pack lot # [id number]   CHOLECYSTECTOMY     COLONOSCOPY WITH PROPOFOL N/A  08/24/2016   Procedure: COLONOSCOPY WITH PROPOFOL;  Surgeon: Lollie Sails, MD;  Location: [facility] ENDOSCOPY;  Service: Endoscopy;  Laterality: N/A;   COLONOSCOPY WITH PROPOFOL N/A 07/12/2018   Procedure: COLONOSCOPY WITH PROPOFOL;  Surgeon: Toledo, Benay Pike, MD;  Location: [facility] ENDOSCOPY;  Service: Gastroenterology;  Laterality: N/A;   dental implants     FOOT SURGERY Right    FRACTURE SURGERY     KNEE CAP   HEMORRHOID SURGERY N/A 04/19/2019   Procedure: HEMORRHOIDECTOMY;  Surgeon: Benjamine Sprague, DO;  Location: [facility] ORS;  Service: General;  Laterality: N/A;   JOINT REPLACEMENT Right 2011   KNEE   PARTIAL MASTECTOMY WITH NEEDLE LOCALIZATION Right 01/02/2016   Procedure: PARTIAL MASTECTOMY WITH NEEDLE LOCALIZATION;  Surgeon: Leonie Green, MD;  Location: [facility] ORS;  Service: General;  Laterality: Right;   PATELLA FRACTURE SURGERY     REPLACEMENT TOTAL KNEE Right    SENTINEL NODE BIOPSY Right 01/02/2016   Procedure: SENTINEL NODE BIOPSY;  Surgeon: Leonie Green, MD;  Location: [facility] ORS;  Service: General;  Laterality: Right;   TONSILLECTOMY     HPI:  MARJA ADDERLEY is a 81 y.o. female with medical history significant of HTN, HLD, stroke, dementia, GERD, depression, OSA on CPAP, mitral valve regurgitation, breast cancer (s/p of right partial mastectomy and radiation therapy), obesity with BMI 34.99, anemia, vertigo, PVC, dementia, mild pulmonary hypertension,  PAF not anticoagulants, who presents with fall. Pt last seen my speech therapy in Aug 2022, with recommendations for Dys 2 and Thin Liquids diet. Pt is currently on Dys 2 and thin liquids. CXR 08/18/21: Cardiomegaly without acute abnormality of the lungs in AP portable  projection. CT Head 08/19/21: No evidence of acute intracranial abnormality.  2. No evidence of acute fracture or traumatic malalignment the  cervical spine.  3. Remote left temporoparietal infarct. Pt resting upon therapist approach, passively agreeable to SLP evaluation.     Assessment / Plan / Recommendation  Clinical Impression  Pt presents with mild oral dysphagia with cognitive feeding component in the setting of recent fall and CVA (2022). Pt w/o s/sx of aspiration across trials of thin liquids, puree, and soft solids. Oral impairment notable for reduced labial seal/pull (leading to challenges with use of straw), reduced oral opening/acceptance, and reduced awareness for bolus (leading to residue after the swallow). Pt deferred trials of regular solids. Further challenging with PO limited by lethargy.  Recommend supervision/assistance for intake to aid feeding and supervision for oral clearance. Continued Dys 2 and thin liquids with aspiration precautions (slow rate, small bites elevated HOB, and alert for PO intake). SLP will follow for endurance of safety with PO intake.   Regarding cognitive linguistic status, pt with limited verbal expression during session. Receptive language deficits noted for one step commands, with max repetition and visual cues provided. Per H&P, pt is at baseline. SLP able to follow up for formal cognitive linguistic assessment if indicated/warranted. SLP Visit Diagnosis: Dysphagia, unspecified (R13.10)    Aspiration Risk  Mild aspiration risk    Diet Recommendation   Dys 2 and Thin Liquids   Medication Administration: Crushed with puree    Other  Recommendations Oral Care Recommendations: Oral care BID    Recommendations for follow up therapy are one component of a multi-disciplinary discharge planning process, led by the attending physician.  Recommendations may be updated based on patient status, additional functional criteria and insurance authorization.  Follow up Recommendations Skilled nursing-short term rehab (<3 hours/day)      Assistance Recommended at Discharge Frequent or constant Supervision/Assistance  Functional Status Assessment Patient has had a recent decline in their functional status and demonstrates the  ability to make significant improvements in function in a reasonable and predictable amount of time.  Frequency and Duration min 2x/week  1 week       Prognosis Prognosis for Safe Diet Advancement: Fair Barriers to Reach Goals: Cognitive deficits;Language deficits      Swallow Study   General Date of Onset: 08/19/21 HPI: KYLENA MOLE is a 81 y.o. female with medical history significant of HTN, HLD, stroke, dementia, GERD, depression, OSA on CPAP, mitral valve regurgitation, breast cancer (s/p of right partial mastectomy and radiation therapy), obesity with BMI 34.99, anemia, vertigo, PVC, dementia, mild pulmonary hypertension, PAF not anticoagulants, who presents with fall. Pt last seen my speech therapy in Aug 2022, with recommendations for Dys 2 and Thin Liquids diet. Pt is currently on Dys 2 and thin liquids. CXR 08/18/21: Cardiomegaly without acute abnormality of the lungs in AP portable  projection. CT Head 08/19/21: No evidence of acute intracranial abnormality.  2. No evidence of acute fracture or traumatic malalignment the  cervical spine.  3. Remote left temporoparietal infarct. Pt resting upon therapist approach, passively agreeable to SLP evaluation. Type of Study: Bedside Swallow Evaluation Previous Swallow Assessment: Last bedside swallow completed on 09/25/20 Diet Prior to this Study: Dysphagia  2 (chopped);Thin liquids Temperature Spikes Noted: No Respiratory Status: Room air History of Recent Intubation: No Behavior/Cognition: Lethargic/Drowsy;Requires cueing;Doesn't follow directions Oral Cavity Assessment: Within Functional Limits Oral Care Completed by SLP: No Oral Cavity - Dentition: Poor condition;Adequate natural dentition Self-Feeding Abilities: Total assist Patient Positioning: Upright in bed Baseline Vocal Quality: Normal Volitional Cough: Cognitively unable to elicit Volitional Swallow: Unable to elicit    Oral/Motor/Sensory Function Overall Oral Motor/Sensory  Function: Generalized oral weakness Facial ROM: Within Functional Limits Facial Symmetry: Within Functional Limits Facial Strength: Within Functional Limits Lingual ROM: Other (Comment) (unable to assess) Velum: Other (comment) (unable to assess) Mandible: Within Functional Limits   Ice Chips Ice chips: Not tested   Thin Liquid Thin Liquid: Within functional limits Presentation: Cup    Nectar Thick Nectar Thick Liquid: Not tested   Honey Thick Honey Thick Liquid: Not tested   Puree Puree: Within functional limits Presentation: Spoon   Solid     Solid: Impaired (solid solids (eggs)) Presentation: Spoon Oral Phase Impairments: Reduced labial seal;Reduced lingual movement/coordination;Poor awareness of bolus Oral Phase Functional Implications: Oral residue     [place] Rho Clapp  MS CCC-SLP  [place] J Clapp 08/19/2021,10:00 AM

## 2021-08-20 ENCOUNTER — Inpatient Hospital Stay: Payer: PPO | Admitting: Anesthesiology

## 2021-08-20 ENCOUNTER — Encounter
Admission: EM | Disposition: A | Discharge status: Skilled Nursing Facility | Payer: Self-pay | Source: Skilled Nursing Facility | Attending: Obstetrics and Gynecology

## 2021-08-20 ENCOUNTER — Inpatient Hospital Stay: Payer: PPO

## 2021-08-20 ENCOUNTER — Other Ambulatory Visit: Payer: Self-pay

## 2021-08-20 DIAGNOSIS — I10 Essential (primary) hypertension: Secondary | ICD-10-CM | POA: Diagnosis not present

## 2021-08-20 DIAGNOSIS — I480 Paroxysmal atrial fibrillation: Secondary | ICD-10-CM | POA: Diagnosis not present

## 2021-08-20 DIAGNOSIS — Z87891 Personal history of nicotine dependence: Secondary | ICD-10-CM | POA: Diagnosis not present

## 2021-08-20 DIAGNOSIS — S72002A Fracture of unspecified part of neck of left femur, initial encounter for closed fracture: Secondary | ICD-10-CM | POA: Diagnosis not present

## 2021-08-20 HISTORY — PX: HIP ARTHROPLASTY: SHX981

## 2021-08-20 LAB — BASIC METABOLIC PANEL
Anion gap: 6 (ref 5–15)
BUN: 33 mg/dL — ABNORMAL HIGH (ref 8–23)
CO2: 23 mmol/L (ref 22–32)
Calcium: 8.6 mg/dL — ABNORMAL LOW (ref 8.9–10.3)
Chloride: 109 mmol/L (ref 98–111)
Creatinine, Ser: 1.08 mg/dL — ABNORMAL HIGH (ref 0.44–1.00)
GFR, Estimated: 51 mL/min — ABNORMAL LOW (ref >60)
Glucose, Bld: 124 mg/dL — ABNORMAL HIGH (ref 70–99)
Potassium: 4.2 mmol/L (ref 3.5–5.1)
Sodium: 138 mmol/L (ref 135–145)

## 2021-08-20 LAB — CBC
HCT: 31.1 % — ABNORMAL LOW (ref 36.0–46.0)
Hemoglobin: 10.2 g/dL — ABNORMAL LOW (ref 12.0–15.0)
MCH: 29.1 pg (ref 26.0–34.0)
MCHC: 32.8 g/dL (ref 30.0–36.0)
MCV: 88.9 fL (ref 80.0–100.0)
Platelets: 305 10*3/uL (ref 150–400)
RBC: 3.5 MIL/uL — ABNORMAL LOW (ref 3.87–5.11)
RDW: 14.5 % (ref 11.5–15.5)
WBC: 15.4 10*3/uL — ABNORMAL HIGH (ref 4.0–10.5)
nRBC: 0 % (ref 0.0–0.2)

## 2021-08-20 SURGERY — HEMIARTHROPLASTY, HIP, DIRECT ANTERIOR APPROACH, FOR FRACTURE
Anesthesia: General | Site: Hip | Laterality: Left

## 2021-08-20 MED ORDER — ONDANSETRON HCL 4 MG/2ML IJ SOLN
INTRAMUSCULAR | Status: AC
  Filled 2021-08-20: qty 2

## 2021-08-20 MED ORDER — PROPOFOL 10 MG/ML IV BOLUS
INTRAVENOUS | Status: AC
  Filled 2021-08-20: qty 20

## 2021-08-20 MED ORDER — HYDROCODONE-ACETAMINOPHEN 5-325 MG PO TABS
1.0000 | ORAL_TABLET | ORAL | Status: DC | PRN
  Administered 2021-08-21: 1 via ORAL
  Filled 2021-08-20: qty 1

## 2021-08-20 MED ORDER — NEOMYCIN-POLYMYXIN B GU 40-200000 IR SOLN
Status: DC | PRN
  Administered 2021-08-20: 16 mL

## 2021-08-20 MED ORDER — POLYETHYLENE GLYCOL 3350 17 G PO PACK: 17.0000 g | PACK | Freq: Every day | ORAL | Status: DC | PRN

## 2021-08-20 MED ORDER — PHENYLEPHRINE 80 MCG/ML (10ML) SYRINGE FOR IV PUSH (FOR BLOOD PRESSURE SUPPORT)
PREFILLED_SYRINGE | INTRAVENOUS | Status: DC | PRN
  Administered 2021-08-20: 160 ug via INTRAVENOUS
  Administered 2021-08-20: 80 ug via INTRAVENOUS
  Administered 2021-08-20 (×3): 160 ug via INTRAVENOUS

## 2021-08-20 MED ORDER — MORPHINE SULFATE (PF) 2 MG/ML IV SOLN
0.5000 mg | INTRAVENOUS | Status: DC | PRN
  Administered 2021-08-20: 1 mg via INTRAVENOUS
  Filled 2021-08-20: qty 1

## 2021-08-20 MED ORDER — PHENYLEPHRINE HCL (PRESSORS) 10 MG/ML IV SOLN
INTRAVENOUS | Status: AC
  Filled 2021-08-20: qty 1

## 2021-08-20 MED ORDER — LACTATED RINGERS IV SOLN: INTRAVENOUS | Status: DC | PRN

## 2021-08-20 MED ORDER — DEXAMETHASONE SODIUM PHOSPHATE 10 MG/ML IJ SOLN
INTRAMUSCULAR | Status: AC
Start: 2021-08-20 — End: ?
  Filled 2021-08-20: qty 1

## 2021-08-20 MED ORDER — PROPOFOL 10 MG/ML IV BOLUS
INTRAVENOUS | Status: DC | PRN
  Administered 2021-08-20: 110 mg via INTRAVENOUS

## 2021-08-20 MED ORDER — PHENOL 1.4 % MT LIQD: 1.0000 | OROMUCOSAL | Status: DC | PRN

## 2021-08-20 MED ORDER — SUGAMMADEX SODIUM 200 MG/2ML IV SOLN
INTRAVENOUS | Status: DC | PRN
  Administered 2021-08-20: 200 mg via INTRAVENOUS

## 2021-08-20 MED ORDER — LIDOCAINE HCL (CARDIAC) PF 100 MG/5ML IV SOSY
PREFILLED_SYRINGE | INTRAVENOUS | Status: DC | PRN
  Administered 2021-08-20: 80 mg via INTRAVENOUS

## 2021-08-20 MED ORDER — ENOXAPARIN SODIUM 40 MG/0.4ML IJ SOSY
40.0000 mg | PREFILLED_SYRINGE | INTRAMUSCULAR | Status: DC
  Administered 2021-08-21 – 2021-08-23 (×3): 40 mg via SUBCUTANEOUS
  Filled 2021-08-20 (×3): qty 0.4

## 2021-08-20 MED ORDER — EPHEDRINE SULFATE (PRESSORS) 50 MG/ML IJ SOLN
INTRAMUSCULAR | Status: DC | PRN
  Administered 2021-08-20: 10 mg via INTRAVENOUS

## 2021-08-20 MED ORDER — PHENYLEPHRINE HCL-NACL 20-0.9 MG/250ML-% IV SOLN
INTRAVENOUS | Status: DC | PRN
  Administered 2021-08-20: 15 ug/min via INTRAVENOUS

## 2021-08-20 MED ORDER — 0.9 % SODIUM CHLORIDE (POUR BTL) OPTIME
TOPICAL | Status: DC | PRN
  Administered 2021-08-20: 2100 mL

## 2021-08-20 MED ORDER — CITALOPRAM HYDROBROMIDE 10 MG PO TABS
10.0000 mg | ORAL_TABLET | ORAL | Status: DC
Start: 2021-08-20 — End: 2021-08-23
  Administered 2021-08-20 – 2021-08-22 (×2): 10 mg via ORAL
  Filled 2021-08-20 (×2): qty 1

## 2021-08-20 MED ORDER — CLOPIDOGREL BISULFATE 75 MG PO TABS
75.0000 mg | ORAL_TABLET | Freq: Every day | ORAL | Status: DC
  Administered 2021-08-21 – 2021-08-23 (×3): 75 mg via ORAL
  Filled 2021-08-20 (×3): qty 1

## 2021-08-20 MED ORDER — VASOPRESSIN 20 UNIT/ML IV SOLN
INTRAVENOUS | Status: DC | PRN
  Administered 2021-08-20 (×2): 1 [IU] via INTRAVENOUS

## 2021-08-20 MED ORDER — ACETAMINOPHEN 10 MG/ML IV SOLN
INTRAVENOUS | Status: DC | PRN
  Administered 2021-08-20: 1000 mg via INTRAVENOUS

## 2021-08-20 MED ORDER — FENTANYL CITRATE (PF) 100 MCG/2ML IJ SOLN
INTRAMUSCULAR | Status: DC | PRN
  Administered 2021-08-20: 50 ug via INTRAVENOUS

## 2021-08-20 MED ORDER — VANCOMYCIN HCL IN DEXTROSE 1-5 GM/200ML-% IV SOLN
1000.0000 mg | Freq: Two times a day (BID) | INTRAVENOUS | Status: AC
  Administered 2021-08-20: 1000 mg via INTRAVENOUS
  Filled 2021-08-20: qty 200

## 2021-08-20 MED ORDER — FENTANYL CITRATE (PF) 100 MCG/2ML IJ SOLN
INTRAMUSCULAR | Status: AC
  Filled 2021-08-20: qty 2

## 2021-08-20 MED ORDER — DEXAMETHASONE SODIUM PHOSPHATE 10 MG/ML IJ SOLN
INTRAMUSCULAR | Status: DC | PRN
  Administered 2021-08-20: 4 mg via INTRAVENOUS

## 2021-08-20 MED ORDER — TRAMADOL HCL 50 MG PO TABS
50.0000 mg | ORAL_TABLET | Freq: Four times a day (QID) | ORAL | Status: DC
  Administered 2021-08-20 – 2021-08-21 (×2): 50 mg via ORAL
  Filled 2021-08-20 (×2): qty 1

## 2021-08-20 MED ORDER — BISACODYL 10 MG RE SUPP: 10.0000 mg | Freq: Every day | RECTAL | Status: DC | PRN

## 2021-08-20 MED ORDER — ALUM & MAG HYDROXIDE-SIMETH 200-200-20 MG/5ML PO SUSP: 30.0000 mL | ORAL | Status: DC | PRN

## 2021-08-20 MED ORDER — ACETAMINOPHEN 500 MG PO TABS
500.0000 mg | ORAL_TABLET | Freq: Four times a day (QID) | ORAL | Status: AC
  Administered 2021-08-20 – 2021-08-21 (×4): 500 mg via ORAL
  Filled 2021-08-20 (×4): qty 1

## 2021-08-20 MED ORDER — NEOMYCIN-POLYMYXIN B GU 40-200000 IR SOLN
Status: AC
  Filled 2021-08-20: qty 20

## 2021-08-20 MED ORDER — VASOPRESSIN 20 UNIT/ML IV SOLN
INTRAVENOUS | Status: AC
  Filled 2021-08-20: qty 1

## 2021-08-20 MED ORDER — MENTHOL 3 MG MT LOZG: 1.0000 | LOZENGE | OROMUCOSAL | Status: DC | PRN

## 2021-08-20 MED ORDER — SUCCINYLCHOLINE CHLORIDE 200 MG/10ML IV SOSY
PREFILLED_SYRINGE | INTRAVENOUS | Status: DC | PRN
  Administered 2021-08-20: 100 mg via INTRAVENOUS

## 2021-08-20 MED ORDER — SODIUM CHLORIDE 0.9 % IV SOLN: INTRAVENOUS | Status: DC

## 2021-08-20 MED ORDER — DOCUSATE SODIUM 100 MG PO CAPS
100.0000 mg | ORAL_CAPSULE | Freq: Two times a day (BID) | ORAL | Status: DC
  Administered 2021-08-20 – 2021-08-23 (×7): 100 mg via ORAL
  Filled 2021-08-20 (×7): qty 1

## 2021-08-20 MED ORDER — ROCURONIUM BROMIDE 100 MG/10ML IV SOLN
INTRAVENOUS | Status: DC | PRN
  Administered 2021-08-20: 10 mg via INTRAVENOUS
  Administered 2021-08-20: 50 mg via INTRAVENOUS

## 2021-08-20 MED ORDER — ONDANSETRON HCL 4 MG/2ML IJ SOLN: 4.0000 mg | Freq: Four times a day (QID) | INTRAMUSCULAR | Status: DC | PRN

## 2021-08-20 MED ORDER — ONDANSETRON HCL 4 MG PO TABS: 4.0000 mg | ORAL_TABLET | Freq: Four times a day (QID) | ORAL | Status: DC | PRN

## 2021-08-20 MED ORDER — OXYCODONE HCL 5 MG/5ML PO SOLN: 5.0000 mg | Freq: Once | ORAL | Status: DC | PRN

## 2021-08-20 MED ORDER — FENTANYL CITRATE (PF) 100 MCG/2ML IJ SOLN: 25.0000 ug | INTRAMUSCULAR | Status: DC | PRN

## 2021-08-20 MED ORDER — ONDANSETRON HCL 4 MG/2ML IJ SOLN
INTRAMUSCULAR | Status: DC | PRN
  Administered 2021-08-20: 4 mg via INTRAVENOUS

## 2021-08-20 MED ORDER — OXYCODONE HCL 5 MG PO TABS: 5.0000 mg | ORAL_TABLET | Freq: Once | ORAL | Status: DC | PRN

## 2021-08-20 MED ORDER — ACETAMINOPHEN 10 MG/ML IV SOLN
INTRAVENOUS | Status: AC
  Filled 2021-08-20: qty 100

## 2021-08-20 SURGICAL SUPPLY — 61 items
BLADE SAGITTAL WIDE XTHICK NO (BLADE) ×2 IMPLANT
BLADE SURG SZ10 CARB STEEL (BLADE) ×2 IMPLANT
BNDG COHESIVE 4X5 TAN ST LF (GAUZE/BANDAGES/DRESSINGS) ×2 IMPLANT
COVER BACK TABLE REUSABLE LG (DRAPES) ×2 IMPLANT
DRAPE 3/4 80X56 (DRAPES) ×4 IMPLANT
DRAPE IMP U-DRAPE 54X76 (DRAPES) ×1 IMPLANT
DRAPE INCISE IOBAN 66X60 STRL (DRAPES) ×2 IMPLANT
DRAPE ORTHO SPLIT 77X108 STRL (DRAPES) ×2
DRAPE SURG 17X11 SM STRL (DRAPES) ×2 IMPLANT
DRAPE SURG ORHT 6 SPLT 77X108 (DRAPES) ×2 IMPLANT
DRAPE U-SHAPE 48X52 POLY STRL (PACKS) ×1 IMPLANT
DRSG OPSITE POSTOP 4X10 (GAUZE/BANDAGES/DRESSINGS) ×1 IMPLANT
DRSG OPSITE POSTOP 4X14 (GAUZE/BANDAGES/DRESSINGS) ×1 IMPLANT
DURAPREP 26ML APPLICATOR (WOUND CARE) ×8 IMPLANT
ELECT CAUTERY BLADE 6.4 (BLADE) ×2 IMPLANT
ELECT REM PT RETURN 9FT ADLT (ELECTROSURGICAL) ×2
ELECTRODE REM PT RTRN 9FT ADLT (ELECTROSURGICAL) ×1 IMPLANT
GAUZE 4X4 16PLY ~~LOC~~+RFID DBL (SPONGE) ×1 IMPLANT
GAUZE SPONGE 4X4 12PLY STRL (GAUZE/BANDAGES/DRESSINGS) ×1 IMPLANT
GAUZE XEROFORM 1X8 LF (GAUZE/BANDAGES/DRESSINGS) ×4 IMPLANT
GLOVE BIOGEL PI IND STRL 9 (GLOVE) ×1 IMPLANT
GLOVE BIOGEL PI INDICATOR 9 (GLOVE) ×1
GLOVE SURG ORTHO 9.0 STRL STRW (GLOVE) ×4 IMPLANT
GOWN STRL REUS TWL 2XL XL LVL4 (GOWN DISPOSABLE) ×2 IMPLANT
GOWN STRL REUS W/ TWL LRG LVL3 (GOWN DISPOSABLE) ×1 IMPLANT
GOWN STRL REUS W/TWL LRG LVL3 (GOWN DISPOSABLE) ×1
HEAD MODULAR ENDO (Orthopedic Implant) ×1 IMPLANT
HEAD UNPLR 43XMDLR STRL HIP (Orthopedic Implant) IMPLANT
HEMOVAC 400ML (MISCELLANEOUS)
HOLSTER ELECTROSUGICAL PENCIL (MISCELLANEOUS) ×2 IMPLANT
IV NS IRRIG 3000ML ARTHROMATIC (IV SOLUTION) ×2 IMPLANT
KIT DRAIN HEMOVAC JP 7FR 400ML (MISCELLANEOUS) ×1 IMPLANT
KIT TURNOVER KIT A (KITS) ×2 IMPLANT
MANIFOLD NEPTUNE II (INSTRUMENTS) ×2 IMPLANT
NDL FILTER BLUNT 18X1 1/2 (NEEDLE) ×1 IMPLANT
NDL MAYO CATGUT SZ4 TPR NDL (NEEDLE) ×1 IMPLANT
NDL SAFETY ECLIPSE 18X1.5 (NEEDLE) ×1 IMPLANT
NEEDLE FILTER BLUNT 18X 1/2SAF (NEEDLE) ×1
NEEDLE FILTER BLUNT 18X1 1/2 (NEEDLE) ×1 IMPLANT
NEEDLE HYPO 18GX1.5 SHARP (NEEDLE) ×1
NEEDLE MAYO CATGUT SZ4 (NEEDLE) ×2 IMPLANT
NS IRRIG 1000ML POUR BTL (IV SOLUTION) ×2 IMPLANT
PACK HIP PROSTHESIS (MISCELLANEOUS) ×2 IMPLANT
PILLOW ABDUCTION FOAM SM (MISCELLANEOUS) ×2 IMPLANT
PULSAVAC PLUS IRRIG FAN TIP (DISPOSABLE) ×2
RETRIEVER SUT HEWSON (MISCELLANEOUS) ×1 IMPLANT
SLEEVE UNITRAX V40 STD (Orthopedic Implant) ×1 IMPLANT
SPONGE T-LAP 18X18 ~~LOC~~+RFID (SPONGE) ×8 IMPLANT
STAPLER SKIN PROX 35W (STAPLE) ×2 IMPLANT
STEM HIP 127 DEG (Stem) ×1 IMPLANT
SUT TICRON 2-0 30IN 311381 (SUTURE) ×9 IMPLANT
SUT VIC AB 0 CT1 36 (SUTURE) ×4 IMPLANT
SUT VIC AB 2-0 CT2 27 (SUTURE) ×4 IMPLANT
SYR 10ML LL (SYRINGE) ×2 IMPLANT
TAPE MICROFOAM 4IN (TAPE) ×1 IMPLANT
TAPE TRANSPORE STRL 2 31045 (GAUZE/BANDAGES/DRESSINGS) ×2 IMPLANT
TIP BRUSH PULSAVAC PLUS 24.33 (MISCELLANEOUS) ×2 IMPLANT
TIP FAN IRRIG PULSAVAC PLUS (DISPOSABLE) ×1 IMPLANT
TRAY FOLEY MTR SLVR 16FR STAT (SET/KITS/TRAYS/PACK) ×1 IMPLANT
TUBE SUCT KAM VAC (TUBING) IMPLANT
WATER STERILE IRR 500ML POUR (IV SOLUTION) ×2 IMPLANT

## 2021-08-20 NOTE — Plan of Care (Signed)

## 2021-08-20 NOTE — Progress Notes (Signed)
PROGRESS NOTE    Courtney Lynn  OXB:[id number] DOB: 01-Nov-1940 DOA: 08/18/2021 PCP: Venia Carbon, MD  Coming from: [facility]    Brief Narrative:  From admission h and p Courtney Lynn is a 81 y.o. female with medical history significant of HTN, HLD, stroke, dementia, GERD, depression, OSA on CPAP, mitral valve regurgitation, breast cancer (s/p of right partial mastectomy and radiation therapy), obesity with BMI 34.99, anemia, vertigo, PVC, dementia, mild pulmonary hypertension, PAF not anticoagulants, who presents with fall.   Per report, pt had witnessed fall in facility this AM. No LOC. Pt fell while she was ambulating with a walker when she reached up to scratch herself and she fell from a standing position. She injured her head, left hip and left arm.  She obviously has pain in left hip and left arm, but cannot provide detailed information to characterize her pain.  Patient does not have active nausea, vomiting, diarrhea, respiratory distress cough noted.  Does not seem to have abdominal pain or chest pain.   Per her brother (I called her brother by phone), patient has history of dementia after stroke.  Normally patient is not orientated x3.     Assessment & Plan:   Principal Problem:   Fracture of femoral neck, left, closed (HCC) Active Problems:   Left wrist fracture   Fracture of radial neck, left, closed   Pubic ramus fracture, left, closed, initial encounter ([place])   Closed compression fracture of body of L1 vertebra ([place])   Fall   PAF (paroxysmal atrial fibrillation) (HCC)   Hypertension   HLD (hyperlipidemia)   Depression   Obesity with body mass index (BMI) of 30.0 to 39.9   Dementia (HCC)   OSA on CPAP   Essential hypertension   Iron deficiency anemia   Leukocytosis   Chronic kidney disease, stage 3a (HCC)   Stroke (HCC)   Fracture of femoral neck, left, closed ([place]) Pubic rami fractures Ortho following, now s/p left hip hemiarthroplasty today  (7/20) - Pain control: morphine prn and percocet - When necessary Zofran for nausea - Robaxin for muscle spasm - bowel regimen - PT/OT eval tomorrow, likely SNF   Distal radius/ulna fracture, radial head fracture Ortho following. Initial treatment with sugar-tong splint with plan for ortho f/u and eventual short arm cast.   PAF (paroxysmal atrial fibrillation) (HCC) Heart rate low 100s, hypertensive.  Patient is currently not taking nodal blockers - I have started coreg. Will d/c amlodipine to make more room for up-titration of coreg  Urinary retention Unable to void this 7/19, bladder scan 329, s/p I/o cath. Currently has cath from surgery - d/c cath per post-surgery protocol, monitor for retention   Hypertension Bp low normal after surgery - amlodipine on hold - started coreg as above - resume home olmesartan as needed   HLD (hyperlipidemia) - Lipitor   Depression - Celexa   Obesity with body mass index (BMI) of 30.0 to 39.9   Dementia (HCC) - Donepezil   Stroke ([place]) -resume plavix, cont lipitor   Chronic kidney disease, stage 3a (HCC) At baseline   Leukocytosis WBC 23.7, no source of infection identified, likely reactive. Improved to   DVT prophylaxis: SCDs, start lovenox tomorrow Code Status: dnr Family Communication: brother (and power of attorney) updated telephonically 7/20  Level of care: Telemetry Medical Status is: Inpatient Remains inpatient appropriate because: severity of illness   Consultants:  ortho  Procedures: pending  Antimicrobials:  none    Subjective:  Sleeping after surgery, rouses somewhat  Objective: Vitals:   08/20/21 1015 08/20/21 1030 08/20/21 1045 08/20/21 1100  BP: 118/79 136/89 105/78 121/83  Pulse: (!) 126 (!) 104 79 (!) 107  Resp: '19 18 17 18  '$ Temp:      TempSrc:      SpO2: 100% 100% 97% 96%  Weight:      Height:        Intake/Output Summary (Last 24 hours) at 08/20/2021 1506 Last data filed at 08/20/2021  1109 Gross per 24 hour  Intake 1340 ml  Output 675 ml  Net 665 ml   Filed Weights   08/18/21 1039  Weight: 84 kg    Examination:  General exam: Appears calm and comfortable, sleepy Respiratory system: Clear to auscultation. Respiratory effort normal. Cardiovascular system: S1 & S2 heard, irreg irreg, soft systolic murmur Gastrointestinal system: Abdomen is nondistended, soft and nontender. No organomegaly or masses felt. Normal bowel sounds heard. Central nervous system: awake, moving all 4 Extremities: warm, no edema Skin: No rashes, lesions or ulcers Psychiatry: calm    Data Reviewed: I have personally reviewed following labs and imaging studies  CBC: Recent Labs  Lab 08/18/21 1123 08/19/21 0527 08/20/21 0417  WBC 23.7* 14.8* 15.4*  NEUTROABS 20.4*  --   --   HGB 11.6* 11.0* 10.2*  HCT 35.4* 32.8* 31.1*  MCV 89.4 88.4 88.9  PLT 429* 351 829   Basic Metabolic Panel: Recent Labs  Lab 08/18/21 1123 08/19/21 0527 08/20/21 0417  NA 138 139 138  K 4.0 4.1 4.2  CL 107 110 109  CO2 22 21* 23  GLUCOSE 172* 145* 124*  BUN 32* 31* 33*  CREATININE 1.14* 1.00 1.08*  CALCIUM 9.2 8.8* 8.6*   GFR: Estimated Creatinine Clearance: 39.5 mL/min (A) (by C-G formula based on SCr of 1.08 mg/dL (H)). Liver Function Tests: Recent Labs  Lab 08/18/21 1123  AST 32  ALT 28  ALKPHOS 115  BILITOT 0.7  PROT 6.5  ALBUMIN 3.4*   No results for input(s): "LIPASE", "AMYLASE" in the last 168 hours. No results for input(s): "AMMONIA" in the last 168 hours. Coagulation Profile: Recent Labs  Lab 08/18/21 1123  INR 1.1   Cardiac Enzymes: No results for input(s): "CKTOTAL", "CKMB", "CKMBINDEX", "TROPONINI" in the last 168 hours. BNP (last 3 results) No results for input(s): "PROBNP" in the last 8760 hours. HbA1C: No results for input(s): "HGBA1C" in the last 72 hours. CBG: Recent Labs  Lab 08/18/21 1713  GLUCAP 130*   Lipid Profile: No results for input(s): "CHOL",  "HDL", "LDLCALC", "TRIG", "CHOLHDL", "LDLDIRECT" in the last 72 hours. Thyroid Function Tests: No results for input(s): "TSH", "T4TOTAL", "FREET4", "T3FREE", "THYROIDAB" in the last 72 hours. Anemia Panel: No results for input(s): "VITAMINB12", "FOLATE", "FERRITIN", "TIBC", "IRON", "RETICCTPCT" in the last 72 hours. Urine analysis:    Component Value Date/Time   COLORURINE YELLOW (A) 08/18/2021 1227   APPEARANCEUR HAZY (A) 08/18/2021 1227   LABSPEC 1.026 08/18/2021 1227   PHURINE 5.0 08/18/2021 1227   GLUCOSEU NEGATIVE 08/18/2021 1227   HGBUR NEGATIVE 08/18/2021 1227   BILIRUBINUR NEGATIVE 08/18/2021 1227   KETONESUR 5 (A) 08/18/2021 1227   PROTEINUR 30 (A) 08/18/2021 1227   NITRITE NEGATIVE 08/18/2021 1227   LEUKOCYTESUR NEGATIVE 08/18/2021 1227   Sepsis Labs: '@LABRCNTIP'$ (procalcitonin:4,lacticidven:4)  )No results found for this or any previous visit (from the past 240 hour(s)).       Radiology Studies: DG Hip Port Unilat With Pelvis 1V Left  Result Date:  08/20/2021 CLINICAL DATA:  Status post left hip replacement EXAM: DG HIP (WITH OR WITHOUT PELVIS) 1V PORT LEFT COMPARISON:  08/18/2021 FINDINGS: Status post interval left total hip arthroplasty. No periprosthetic lucency or fracture. Near anatomic alignment of the prosthetic components. Air within the soft tissues is not unexpected postoperatively. Superficial skin staples. IMPRESSION: Expected postoperative appearance status post left total hip arthroplasty. Electronically Signed   By: Merilyn Baba M.D.   On: 08/20/2021 11:12        Scheduled Meds:  acetaminophen  500 mg Oral Q6H   amLODipine  10 mg Oral Daily   atorvastatin  80 mg Oral Daily   carvedilol  6.25 mg Oral BID WC   citalopram  10 mg Oral QODAY   [START ON 08/21/2021] clopidogrel  75 mg Oral Daily   docusate sodium  100 mg Oral BID   donepezil  10 mg Oral QHS   [START ON 08/21/2021] enoxaparin (LOVENOX) injection  40 mg Subcutaneous Q24H   famotidine  20  mg Oral Daily   feeding supplement  237 mL Oral BID BM   gabapentin  200 mg Oral QHS   lactase  1 tablet Oral Daily   lidocaine  1 patch Transdermal Q24H   multivitamin with minerals  1 tablet Oral BID   polyethylene glycol  17 g Oral Daily   senna  1 tablet Oral QHS   traMADol  50 mg Oral Q6H   Continuous Infusions:  sodium chloride 75 mL/hr at 08/20/21 1346   vancomycin       LOS: 2 days     Desma Maxim, MD [facility]   If 7PM-7AM, please contact night-coverage [email or web address] Password [email or web address] 08/20/2021, 3:06 PM

## 2021-08-20 NOTE — Anesthesia Procedure Notes (Signed)
Procedure Name: Intubation Date/Time: 08/20/2021 8:05 AM  Performed by: Lowry Bowl, CRNAPre-anesthesia Checklist: Patient identified, Emergency Drugs available, Suction available and Patient being monitored Patient Re-evaluated:Patient Re-evaluated prior to induction Oxygen Delivery Method: Circle system utilized Preoxygenation: Pre-oxygenation with 100% oxygen Induction Type: IV induction Ventilation: Mask ventilation without difficulty Laryngoscope Size: Glidescope (s3) Grade View: Grade I Tube type: Oral Tube size: 7.0 mm Number of attempts: 1 Airway Equipment and Method: Stylet and Oral airway Placement Confirmation: ETT inserted through vocal cords under direct vision, positive ETCO2 and breath sounds checked- equal and bilateral Secured at: 22 cm Tube secured with: Tape Dental Injury: Teeth and Oropharynx as per pre-operative assessment  Comments: Placed by Orland Mustard SRNA

## 2021-08-20 NOTE — Transfer of Care (Signed)
Immediate Anesthesia Transfer of Care Note  Patient: Courtney Lynn  Procedure(s) Performed: ARTHROPLASTY BIPOLAR HIP (HEMIARTHROPLASTY) (Left: Hip)  Patient Location: PACU  Anesthesia Type:General  Level of Consciousness: awake, drowsy and patient cooperative  Airway & Oxygen Therapy: Patient Spontanous Breathing and Patient connected to face mask oxygen  Post-op Assessment: Report given to RN and Post -op Vital signs reviewed and stable  Post vital signs: Reviewed and stable  Last Vitals:  Vitals Value Taken Time  BP 122/100 08/20/21 1007  Temp    Pulse 126 08/20/21 1014  Resp 19 08/20/21 1014  SpO2 100 % 08/20/21 1014  Vitals shown include unvalidated device data.  Last Pain:  Vitals:   08/19/21 2348  TempSrc:   PainSc: Asleep         Complications: No notable events documented.

## 2021-08-20 NOTE — Progress Notes (Signed)
The patient has been re-examined, and the chart reviewed, and there have been no interval changes to the documented history and physical.    Patient's left hip has been marked.

## 2021-08-20 NOTE — Progress Notes (Addendum)
Subjective:  POST OP CHECK s/p left hip hemiarthroplasty.   Patient is sleeping comfortably in bed.  Her left arm remains in a sugar-tong splint.  Patient has advanced dementia and a history of stroke.  Patient is unable to provide a history at baseline.  Patient does not verbally communicate.  Objective:   VITALS:   Vitals:   08/20/21 1015 08/20/21 1030 08/20/21 1045 08/20/21 1100  BP: 118/79 136/89 105/78 121/83  Pulse: (!) 126 (!) 104 79 (!) 107  Resp: '19 18 17 18  '$ Temp:      TempSrc:      SpO2: 100% 100% 97% 96%  Weight:      Height:        PHYSICAL EXAM: Left lower extremity Intact pulses distally Incision: no drainage No cellulitis present Compartment soft  LABS  Results for orders placed or performed during the hospital encounter of 08/18/21 (from the past 24 hour(s))  CBC     Status: Abnormal   Collection Time: 08/20/21  4:17 AM  Result Value Ref Range   WBC 15.4 (H) 4.0 - 10.5 K/uL   RBC 3.50 (L) 3.87 - 5.11 MIL/uL   Hemoglobin 10.2 (L) 12.0 - 15.0 g/dL   HCT 31.1 (L) 36.0 - 46.0 %   MCV 88.9 80.0 - 100.0 fL   MCH 29.1 26.0 - 34.0 pg   MCHC 32.8 30.0 - 36.0 g/dL   RDW 14.5 11.5 - 15.5 %   Platelets 305 150 - 400 K/uL   nRBC 0.0 0.0 - 0.2 %  Basic metabolic panel     Status: Abnormal   Collection Time: 08/20/21  4:17 AM  Result Value Ref Range   Sodium 138 135 - 145 mmol/L   Potassium 4.2 3.5 - 5.1 mmol/L   Chloride 109 98 - 111 mmol/L   CO2 23 22 - 32 mmol/L   Glucose, Bld 124 (H) 70 - 99 mg/dL   BUN 33 (H) 8 - 23 mg/dL   Creatinine, Ser 1.08 (H) 0.44 - 1.00 mg/dL   Calcium 8.6 (L) 8.9 - 10.3 mg/dL   GFR, Estimated 51 (L) >60 mL/min   Anion gap 6 5 - 15    DG Hip Port Unilat With Pelvis 1V Left  Result Date: 08/20/2021 CLINICAL DATA:  Status post left hip replacement EXAM: DG HIP (WITH OR WITHOUT PELVIS) 1V PORT LEFT COMPARISON:  08/18/2021 FINDINGS: Status post interval left total hip arthroplasty. No periprosthetic lucency or fracture. Near  anatomic alignment of the prosthetic components. Air within the soft tissues is not unexpected postoperatively. Superficial skin staples. IMPRESSION: Expected postoperative appearance status post left total hip arthroplasty. Electronically Signed   By: Merilyn Baba M.D.   On: 08/20/2021 11:12    Assessment/Plan: Day of Surgery   Principal Problem:   Fracture of femoral neck, left, closed ([place]) Active Problems:   Hypertension   Essential hypertension   OSA on CPAP   Dementia ([place])   Fall   Left wrist fracture   Pubic ramus fracture, left, closed, initial encounter ([place])   Closed compression fracture of body of L1 vertebra (HCC)   PAF (paroxysmal atrial fibrillation) (HCC)   Depression   HLD (hyperlipidemia)   Obesity with body mass index (BMI) of 30.0 to 39.9   Iron deficiency anemia   Leukocytosis   Chronic kidney disease, stage 3a (HCC)   Fracture of radial neck, left, closed   Stroke (HCC)  Postoperative x-rays were reviewed by me and demonstrate the hemiarthroplasty prosthesis  is well-positioned.  There is no evidence of postop complication.  Patient's leg lengths are equivalent.  Patient has an abduction pillow in place.  Patient is resting comfortably.  PT will begin tomorrow.  Patient is weightbearing as tolerated on left lower extremity with posterior hip precautions.  She may participate with physical therapy as tolerated.  Patient's dementia is likely to be a significant barrier to physical therapy.  Patient will complete 24 hours of postop antibiotics.  Her Foley catheter will be removed in the morning.  Patient is from [facility] and will return to [facility] upon discharge from the hospital.  Patient's Plavix will be restarted tomorrow.  Patient has also been ordered for Lovenox daily for DVT prophylaxis.    [place] , MD 08/20/2021, 3:51 PM

## 2021-08-20 NOTE — Op Note (Addendum)
08/20/2021  10:09 AM  PATIENT:  Courtney Lynn   MRN: [id number]  PRE-OPERATIVE DIAGNOSIS:  Left femoral neck fracture  POST-OPERATIVE DIAGNOSIS:  Left femoral neck fracture  PROCEDURE:  Procedure(s): Left hip hemiarthroplasty  PREOPERATIVE INDICATIONS:    Courtney Lynn is an 81 y.o. female who was admitted with a diagnosis of Left femoral neck fracture.  I have recommended surgical fixation with hemiarthroplasty for this injury. I have explained the surgery and the postoperative course to the patient's brother, given the patient's advanced dementia, who agreed with surgical management of this fracture.    The risks benefits and alternatives were discussed with the patient and their family including but not limited to the risks of  infection requiring removal of the prosthesis, bleeding requiring blood transfusion, nerve injury especially to the sciatic nerve leading to foot drop or lower extremity numbness, periprosthetic fracture, dislocation leg length discrepancy, change in lower extremity rotation persistent hip pain, loosening or failure of the components and the need for revision surgery. Medical risks include but are not limited to DVT and pulmonary embolism, myocardial infarction, stroke, pneumonia, respiratory failure and death.  OPERATIVE REPORT     SURGEON:  Thornton Park, MD    ASSISTANT:  Danae Orleans, PA    ANESTHESIA:  general  EBL:  785 cc    COMPLICATIONS:  None.   SPECIMEN: Femoral head to pathology    COMPONENTS:  [employer] Accolade 2 femoral component size 5 with a 43 mm Unitrax Femoral Head with a standard neck adjustment sleeve.    PROCEDURE IN DETAIL:   The patient was met in the holding area and  identified.  The appropriate hip was identified and marked at the operative site after verbally confirming with the patient that this was the correct site of surgery.  The patient was then transported to the OR  and  underwent general anesthesia.  The patient  was then placed in the lateral decubitus position with the operative side up and secured on the operating room table with a pegboard and all bony prominences were adequately padded. This included an axillary roll and additional padding around the nonoperative leg to prevent compression to the common peroneal nerve.    The operative lower extremity was prepped and draped in a sterile fashion.  A time out was performed prior to incision to verify patient's name, date of birth, medical record number, correct site of surgery correct procedure to be performed. The timeout was also used to verify the patient received antibiotics now appropriate instruments, implants and radiographic studies were available in the room. Once all in attendance were in agreement case began.    A posterolateral approach was utilized via sharp dissection  carried down to the subcutaneous tissue.  Bleeding vessels were coagulated using electrocautery.  The fascia lata was identified and incised along the length of the skin incision.  The gluteus maximus muscle was then split in line with its fibers. Self-retaining retractors were  inserted.  With the hip internally rotated, the short external rotators  were identified and removed from the posterior attachment from the greater trochanter. The piriformis was tagged for later repair. The capsule was identified and a T-shaped capsulotomy was performed. The capsule was tagged with #2 Tycron for later repair.  The femoral neck fracture was exposed, and the femoral head was removed using a corkscrew device. This was measured to be 43 mm in diameter. The attention was then turned to proximal femur preparation.  An oscillating saw  was used to perform a proximal femoral osteotomy 1 fingerbreadth above the lesser trochanter. The trial 43 mm femoral head was placed into the acetabulum and had an excellent suction fit. The attention was then turned back to femoral preparation.    A femoral skid and  Cobra retractor were placed under the femoral neck to allow for adequate visualization. A box osteotome was used to make the initial entry into the proximal femur. A single hand reamer was used to prepare the femoral canal. A T-shaped femoral canal sounder was then used to ensure no penetration femoral cortex had occurred during reaming. The proximal femur was then sequentially broached by hand. A size 5 femoral trial broach was found to have best medial to lateral canal fit. Once adequate mediolateral canal fill was achieved the trial femoral broach, neck, and head was assembled and the hip was reduced. It was found to have excellent stability, equivalent leg lengths with functional range of motion. The trial components were then removed.  I copiously irrigated the femoral canal and then impacted the real femoral prosthesis into place into the appropriate version, slightly anteverted to the normal anatomy, and I impacted the actual 43 mm Unitrax femoral component with a standard neck adjustment sleeve into place. The hip was then reduced and taken through functional range of motion and found to have excellent stability. Leg lengths were restored. The hip joint was copiously irrigated.   A soft tissue repair of the capsule and external rotators was performed using #2 Tycron. Excellent posterior capsular repair was achieved. The fascia lata was then closed with interrupted 0 Vicryl suture. The subcutaneous tissues were closed with 2-0 Vicryl and the skin approximated with staples.   The patient was then placed supine on the operative table. Leg lengths were checked clinically and found to be equivalent. An abduction pillow was placed between the lower extremities. The patient was then transferred to a hospital bed and brought to the PACU in stable condition. I was scrubbed and present the entire case and all sharp and instrument counts were correct at the conclusion of the case. I spoke with the patient's  brother by phone as he lives in [place] to let him know the case was completed without complication patient was stable in recovery room.   Timoteo Gaul, MD Orthopedic Surgeon

## 2021-08-20 NOTE — Anesthesia Postprocedure Evaluation (Signed)
Anesthesia Post Note  Patient: Courtney Lynn  Procedure(s) Performed: ARTHROPLASTY BIPOLAR HIP (HEMIARTHROPLASTY) (Left: Hip)  Patient location during evaluation: PACU Anesthesia Type: General Level of consciousness: awake and alert Pain management: pain level controlled Vital Signs Assessment: post-procedure vital signs reviewed and stable Respiratory status: spontaneous breathing, nonlabored ventilation, respiratory function stable and patient connected to nasal cannula oxygen Cardiovascular status: blood pressure returned to baseline and stable Postop Assessment: no apparent nausea or vomiting Anesthetic complications: no   No notable events documented.   Last Vitals:  Vitals:   08/20/21 1045 08/20/21 1100  BP: 105/78 121/83  Pulse: 79 (!) 107  Resp: 17 18  Temp:    SpO2: 97% 96%    Last Pain:  Vitals:   08/19/21 2348  TempSrc:   PainSc: [place]

## 2021-08-20 NOTE — Plan of Care (Signed)

## 2021-08-21 ENCOUNTER — Encounter: Payer: Self-pay | Admitting: Orthopedic Surgery

## 2021-08-21 ENCOUNTER — Inpatient Hospital Stay: Payer: PPO

## 2021-08-21 DIAGNOSIS — S72002A Fracture of unspecified part of neck of left femur, initial encounter for closed fracture: Secondary | ICD-10-CM | POA: Diagnosis not present

## 2021-08-21 LAB — CBC
HCT: 29.6 % — ABNORMAL LOW (ref 36.0–46.0)
Hemoglobin: 9.5 g/dL — ABNORMAL LOW (ref 12.0–15.0)
MCH: 29.6 pg (ref 26.0–34.0)
MCHC: 32.1 g/dL (ref 30.0–36.0)
MCV: 92.2 fL (ref 80.0–100.0)
Platelets: 278 10*3/uL (ref 150–400)
RBC: 3.21 MIL/uL — ABNORMAL LOW (ref 3.87–5.11)
RDW: 14.4 % (ref 11.5–15.5)
WBC: 17 10*3/uL — ABNORMAL HIGH (ref 4.0–10.5)
nRBC: 0 % (ref 0.0–0.2)

## 2021-08-21 LAB — BASIC METABOLIC PANEL
Anion gap: 6 (ref 5–15)
BUN: 42 mg/dL — ABNORMAL HIGH (ref 8–23)
CO2: 24 mmol/L (ref 22–32)
Calcium: 8.5 mg/dL — ABNORMAL LOW (ref 8.9–10.3)
Chloride: 109 mmol/L (ref 98–111)
Creatinine, Ser: 1.22 mg/dL — ABNORMAL HIGH (ref 0.44–1.00)
GFR, Estimated: 44 mL/min — ABNORMAL LOW (ref >60)
Glucose, Bld: 89 mg/dL (ref 70–99)
Potassium: 4.3 mmol/L (ref 3.5–5.1)
Sodium: 139 mmol/L (ref 135–145)

## 2021-08-21 LAB — SURGICAL PATHOLOGY

## 2021-08-21 NOTE — Evaluation (Signed)
Physical Therapy Evaluation Patient Details Name: Courtney Lynn MRN: [id number] DOB: [birth year] Today's Date: 08/21/2021  History of Present Illness  Pt is an 81 y/o F admitted on 08/18/21 after presenting to the ED 2/2 a fall in her facility & pt injured her head, L hip & L arm. Pt found to have L femoral neck fx & pubic rami fxs. Pt is s/p L hip hemiarthropplasty 08/20/21. Pt also noted to have L radius/ulna fx & radial head fx. PMH: HTN, HLD, stroke, dementia, GERD, depression, OSA on CPAP, mitral valve regurgitation, breast CA (s/p R partial mastectomy & radiation therapy), obesity, anemia, vertigo, PVC, dementia, mild pulmonary HTN, PAF not on anticoagulants  Clinical Impression  Pt seen for PT evaluation with co-tx with OT. Pt received in bed making clicking noise & pointing to wall. Pt with impaired cognition & doesn't even respond to name. Pt with low BP & fluctuating HR but MD cleared pt to attempt sitting EOB. Pt requires +2 to attempt supine>sit but actively resists movement & demonstrates behaviors consistent with pain in RLE. It's likely that pt's functional mobility progress will be impaired by her decreased cognition. Will continue to follow pt acutely to address balance, RLE strengthening & ROM, and bed mobility. (Pt left with LUE mitten donned, pt noted to have LUE edema in fingers.)  BP in RUE semi fowler in bed: 87/50 mmHg MAP 61, HR 40-127  BP in RUE at end of session, semi fowler in bed: 95/65 mmHg MAP 76, HR 89-127   Recommendations for follow up therapy are one component of a multi-disciplinary discharge planning process, led by the attending physician.  Recommendations may be updated based on patient status, additional functional criteria and insurance authorization.  Follow Up Recommendations Skilled nursing-short term rehab (<3 hours/day) Can patient physically be transported by private vehicle: No    Assistance Recommended at Discharge Frequent or constant  Supervision/Assistance  Patient can return home with the following  Two people to help with walking and/or transfers;Two people to help with bathing/dressing/bathroom;Direct supervision/assist for medications management;Assist for transportation;Assistance with feeding;Assistance with cooking/housework;Direct supervision/assist for financial management    Equipment Recommendations None recommended by PT  Recommendations for Other Services       Functional Status Assessment Patient has had a recent decline in their functional status and demonstrates the ability to make significant improvements in function in a reasonable and predictable amount of time.     Precautions / Restrictions Precautions Precautions: Fall Restrictions Weight Bearing Restrictions: Yes RUE Weight Bearing: Weight bear through elbow only (can use platform RW) RLE Weight Bearing: Weight bearing as tolerated      Mobility  Bed Mobility Overal bed mobility: Needs Assistance Bed Mobility: Supine to Sit, Sit to Supine     Supine to sit: Total assist, +2 for safety/equipment, +2 for physical assistance, HOB elevated Sit to supine: Total assist, HOB elevated, +2 for safety/equipment, +2 for physical assistance        Transfers                        Ambulation/Gait                  Stairs            Wheelchair Mobility    Modified Rankin (Stroke Patients Only)       Balance  Pertinent Vitals/Pain Pain Assessment Pain Assessment: Faces Faces Pain Scale: Hurts little more Pain Location: RLE Pain Descriptors / Indicators: Discomfort, Grimacing Pain Intervention(s): Monitored during session, Repositioned    Home Living Family/patient expects to be discharged to:: Skilled nursing facility                   Additional Comments: [facility], long term care    Prior Function Prior Level of Function : Patient  poor historian/Family not available             Mobility Comments: unsure, pt unable to report       Hand Dominance        Extremity/Trunk Assessment   Upper Extremity Assessment Upper Extremity Assessment: Generalized weakness;Difficult to assess due to impaired cognition    Lower Extremity Assessment Lower Extremity Assessment: Generalized weakness;Difficult to assess due to impaired cognition       Communication   Communication: Receptive difficulties;Expressive difficulties;HOH (Pt frequently making clicking noises throughout session, pointing to objects on wall, does not respond to name, occasionally verbalizes coherent words but not in contecxt that can be understood)  Cognition Arousal/Alertness: Awake/alert Behavior During Therapy: Flat affect Overall Cognitive Status: History of cognitive impairments - at baseline                                 General Comments: Pt does not respond to name, frequently pointing at objects on the wall, will occasionally verbalize clear words but not in a conext that can be understood.        General Comments      Exercises     Assessment/Plan    PT Assessment Patient needs continued PT services  PT Problem List Decreased coordination;Decreased strength;Cardiopulmonary status limiting activity;Pain;Decreased range of motion;Decreased cognition;Decreased activity tolerance;Decreased knowledge of use of DME;Decreased balance;Decreased safety awareness;Decreased mobility;Decreased knowledge of precautions       PT Treatment Interventions DME instruction;Balance training;Gait training;Therapeutic exercise;Stair training;Neuromuscular re-education;Functional mobility training;Therapeutic activities;Patient/family education;Modalities;Manual techniques    PT Goals (Current goals can be found in the Care Plan section)  Acute Rehab PT Goals PT Goal Formulation: Patient unable to participate in goal setting Time For  Goal Achievement: 09/04/21 Potential to Achieve Goals: Good    Frequency Min 2X/week     Co-evaluation PT/OT/SLP Co-Evaluation/Treatment: Yes Reason for Co-Treatment: Complexity of the patient's impairments (multi-system involvement);Necessary to address cognition/behavior during functional activity;For patient/therapist safety;To address functional/ADL transfers PT goals addressed during session: Mobility/safety with mobility         AM-PAC PT "6 Clicks" Mobility  Outcome Measure Help needed turning from your back to your side while in a flat bed without using bedrails?: Total Help needed moving from lying on your back to sitting on the side of a flat bed without using bedrails?: Total Help needed moving to and from a bed to a chair (including a wheelchair)?: Total Help needed standing up from a chair using your arms (e.g., wheelchair or bedside chair)?: Total Help needed to walk in hospital room?: Total Help needed climbing 3-5 steps with a railing? : Total 6 Click Score: 6    End of Session   Activity Tolerance:  (limited 2/2 impaired cognition) Patient left: in bed;with call bell/phone within reach;with bed alarm set;with SCD's reapplied (hip abduction wedge in place) Nurse Communication: Mobility status PT Visit Diagnosis: Muscle weakness (generalized) (M62.81);Other abnormalities of gait and mobility (R26.89);Difficulty in walking, not elsewhere classified (  R26.2)    Time: 5525-8948 PT Time Calculation (min) (ACUTE ONLY): 15 min   Charges:   PT Evaluation $PT Eval High Complexity: 1 High          Lavone Nian, PT, DPT 08/21/21, 12:35 PM   Waunita Schooner 08/21/2021, 12:32 PM

## 2021-08-21 NOTE — Progress Notes (Signed)
Speech Language Pathology Treatment: Dysphagia  Patient Details Name: Courtney Lynn MRN: [id number] DOB: 1940/08/11 Today's Date: 08/21/2021 Time: 8546-2703 SLP Time Calculation (min) (ACUTE ONLY): 39 min  Assessment / Plan / Recommendation Clinical Impression  Pt tray sitting at bedside when ST entered. Nsg giving meds crushed in applesauce. Pt needed encouragement to take but tolerated well. Pt has Aphasia with difficulty communicating and understanding. Pt tolerated the first few bites of chopped diet well. Needed extended time to chew and swallow as well as alternating solids with liquids. After a few bites, she would chew, then grab therapist hand and pull it to her mouth to spit the food out in my hand. It did not matter if the consistency was chopped or blended when Pt did this. After a few minutes of rest and sips of liquid she was again able to take the food and swallow without spitting it out. No s/s of aspiration with any consistency. Rec continue with current diet. PO intake will likely be limited secondary to cognitive status and behavior of spitting food out at times. Rec feed throughout the day and allow periods of reset to reset. Pt tolerated holding the cup with assistant to feed herself the liquids. Prognosis fair.   HPI HPI: Courtney Lynn is a 81 y.o. female with medical history significant of HTN, HLD, stroke, dementia, GERD, depression, OSA on CPAP, mitral valve regurgitation, breast cancer (s/p of right partial mastectomy and radiation therapy), obesity with BMI 34.99, anemia, vertigo, PVC, dementia, mild pulmonary hypertension, PAF not anticoagulants, who presents with fall. Pt last seen my speech therapy in Aug 2022, with recommendations for Dys 2 and Thin Liquids diet. Pt is currently on Dys 2 and thin liquids. CXR 08/18/21: Cardiomegaly without acute abnormality of the lungs in AP portable  projection. CT Head 08/19/21: No evidence of acute intracranial abnormality.  2. No evidence  of acute fracture or traumatic malalignment the  cervical spine.  3. Remote left temporoparietal infarct. Pt resting upon therapist approach, passively agreeable to SLP evaluation.      SLP Plan  Continue with current plan of care   F/u 1-3 days, consider trying pureed foods to see if intake is better   Recommendations for follow up therapy are one component of a multi-disciplinary discharge planning process, led by the attending physician.  Recommendations may be updated based on patient status, additional functional criteria and insurance authorization.    Recommendations  Diet recommendations: Dysphagia 2 (fine chop) Liquids provided via: Cup Medication Administration: Crushed with puree Supervision: Full supervision/cueing for compensatory strategies Compensations: Minimize environmental distractions;Small sips/bites;Slow rate Postural Changes and/or Swallow Maneuvers: Seated upright 90 degrees;Upright 30-60 min after meal                Oral Care Recommendations: Oral care BID Follow Up Recommendations: Skilled nursing-short term rehab (<3 hours/day) Assistance recommended at discharge: Frequent or constant Supervision/Assistance SLP Visit Diagnosis: Dysphagia, unspecified (R13.10) Plan: Continue with current plan of care           Lucila Maine  08/21/2021, 1:20 PM

## 2021-08-21 NOTE — Progress Notes (Signed)
PROGRESS NOTE    Courtney Lynn  FXT:[id number] DOB: 04-13-1940 DOA: 08/18/2021 PCP: Venia Carbon, MD  Coming from: [facility]    Brief Narrative:  From admission h and p Courtney Lynn is a 81 y.o. female with medical history significant of HTN, HLD, stroke, dementia, GERD, depression, OSA on CPAP, mitral valve regurgitation, breast cancer (s/p of right partial mastectomy and radiation therapy), obesity with BMI 34.99, anemia, vertigo, PVC, dementia, mild pulmonary hypertension, PAF not anticoagulants, who presents with fall.   Per report, pt had witnessed fall in facility this AM. No LOC. Pt fell while she was ambulating with a walker when she reached up to scratch herself and she fell from a standing position. She injured her head, left hip and left arm.  She obviously has pain in left hip and left arm, but cannot provide detailed information to characterize her pain.  Patient does not have active nausea, vomiting, diarrhea, respiratory distress cough noted.  Does not seem to have abdominal pain or chest pain.   Per her brother (I called her brother by phone), patient has history of dementia after stroke.  Normally patient is not orientated x3.     Assessment & Plan:   Principal Problem:   Fracture of femoral neck, left, closed (HCC) Active Problems:   Left wrist fracture   Fracture of radial neck, left, closed   Pubic ramus fracture, left, closed, initial encounter ([place])   Closed compression fracture of body of L1 vertebra ([place])   Fall   PAF (paroxysmal atrial fibrillation) (HCC)   Hypertension   HLD (hyperlipidemia)   Depression   Obesity with body mass index (BMI) of 30.0 to 39.9   Dementia (HCC)   OSA on CPAP   Essential hypertension   Iron deficiency anemia   Leukocytosis   Chronic kidney disease, stage 3a (HCC)   Stroke (HCC)   Fracture of femoral neck, left, closed ([place]) Pubic rami fractures Ortho following, now s/p left hip hemiarthroplasty 7/20. Hgb this  morning appropriate. - Pain control: tylenol (holding opioids given encephalopathy) - When necessary Zofran for nausea - bowel regimen - PT/OT eval tomorrow   Encephalopathy Has baseline advanced dementia, CVA. Has been receiving opioids - stop opioids - will check mri - if condition does not improve brother will likely move to comfort care   Distal radius/ulna fracture, radial head fracture Ortho following. Initial treatment with sugar-tong splint with plan for ortho f/u and eventual short arm cast.   PAF (paroxysmal atrial fibrillation) (HCC) Heart rate 80s with coreg but hypotensive today. Not anticoagulated - hold coreg  Urinary retention Unable to void this 7/19, bladder scan 329, s/p I/o cath. Cath has been discontinued. Only 30 ml of uop has been documented last 24 - will have nursing check bladder scan - cont ivf   Hypertension Bp low normal this morning - holding all meds   HLD (hyperlipidemia) - Lipitor   Depression - Celexa   Obesity with body mass index (BMI) of 30.0 to 39.9   Dementia (HCC) - Donepezil   Stroke ([place]) Large stroke last year, has had significant decline since then - plavis, atorvastatin   Chronic kidney disease, stage 3a (HCC) At baseline   DVT prophylaxis: lovenox  Code Status: dnr Family Communication: brother (and power of attorney) updated telephonically 7/21  Level of care: Telemetry Medical Status is: Inpatient Remains inpatient appropriate because: severity of illness   Consultants:  ortho  Procedures: pending  Antimicrobials:  none    Subjective: Sleeping, rouses a little  Objective: Vitals:   08/21/21 0010 08/21/21 0340 08/21/21 0753 08/21/21 0830  BP: (!) 105/58 103/68 (!) 89/63 111/70  Pulse: (!) 53 76 92   Resp: '15 15 16   '$ Temp: 98.1 F (36.7 C) 98.1 F (36.7 C) 98.3 F (36.8 C)   TempSrc:      SpO2: 100% 100% 93%   Weight:      Height:        Intake/Output Summary (Last 24 hours) at 08/21/2021  1214 Last data filed at 08/21/2021 0900 Gross per 24 hour  Intake 500.32 ml  Output 125 ml  Net 375.32 ml   Filed Weights   08/18/21 1039  Weight: 84 kg    Examination:  General exam: Appears calm and comfortable, asleep Respiratory system: Clear to auscultation. Respiratory effort normal. Cardiovascular system: S1 & S2 heard, irreg irreg, soft systolic murmur Gastrointestinal system: Abdomen is nondistended, soft and nontender. No organomegaly or masses felt. Normal bowel sounds heard. Central nervous system: awake, moving all 4 Extremities: warm, splint left arm Skin: dressing c/d/i Psychiatry: calm    Data Reviewed: I have personally reviewed following labs and imaging studies  CBC: Recent Labs  Lab 08/18/21 1123 08/19/21 0527 08/20/21 0417 08/21/21 0441  WBC 23.7* 14.8* 15.4* 17.0*  NEUTROABS 20.4*  --   --   --   HGB 11.6* 11.0* 10.2* 9.5*  HCT 35.4* 32.8* 31.1* 29.6*  MCV 89.4 88.4 88.9 92.2  PLT 429* 351 305 741   Basic Metabolic Panel: Recent Labs  Lab 08/18/21 1123 08/19/21 0527 08/20/21 0417 08/21/21 0441  NA 138 139 138 139  K 4.0 4.1 4.2 4.3  CL 107 110 109 109  CO2 22 21* 23 24  GLUCOSE 172* 145* 124* 89  BUN 32* 31* 33* 42*  CREATININE 1.14* 1.00 1.08* 1.22*  CALCIUM 9.2 8.8* 8.6* 8.5*   GFR: Estimated Creatinine Clearance: 35 mL/min (A) (by C-G formula based on SCr of 1.22 mg/dL (H)). Liver Function Tests: Recent Labs  Lab 08/18/21 1123  AST 32  ALT 28  ALKPHOS 115  BILITOT 0.7  PROT 6.5  ALBUMIN 3.4*   No results for input(s): "LIPASE", "AMYLASE" in the last 168 hours. No results for input(s): "AMMONIA" in the last 168 hours. Coagulation Profile: Recent Labs  Lab 08/18/21 1123  INR 1.1   Cardiac Enzymes: No results for input(s): "CKTOTAL", "CKMB", "CKMBINDEX", "TROPONINI" in the last 168 hours. BNP (last 3 results) No results for input(s): "PROBNP" in the last 8760 hours. HbA1C: No results for input(s): "HGBA1C" in the  last 72 hours. CBG: Recent Labs  Lab 08/18/21 1713  GLUCAP 130*   Lipid Profile: No results for input(s): "CHOL", "HDL", "LDLCALC", "TRIG", "CHOLHDL", "LDLDIRECT" in the last 72 hours. Thyroid Function Tests: No results for input(s): "TSH", "T4TOTAL", "FREET4", "T3FREE", "THYROIDAB" in the last 72 hours. Anemia Panel: No results for input(s): "VITAMINB12", "FOLATE", "FERRITIN", "TIBC", "IRON", "RETICCTPCT" in the last 72 hours. Urine analysis:    Component Value Date/Time   COLORURINE YELLOW (A) 08/18/2021 1227   APPEARANCEUR HAZY (A) 08/18/2021 1227   LABSPEC 1.026 08/18/2021 1227   PHURINE 5.0 08/18/2021 1227   GLUCOSEU NEGATIVE 08/18/2021 1227   HGBUR NEGATIVE 08/18/2021 1227   BILIRUBINUR NEGATIVE 08/18/2021 1227   KETONESUR 5 (A) 08/18/2021 1227   PROTEINUR 30 (A) 08/18/2021 1227   NITRITE NEGATIVE 08/18/2021 1227   LEUKOCYTESUR NEGATIVE 08/18/2021 1227   Sepsis Labs: '@LABRCNTIP'$ (procalcitonin:4,lacticidven:4)  )No results  found for this or any previous visit (from the past 240 hour(s)).       Radiology Studies: DG Hip Port Unilat With Pelvis 1V Left  Result Date: 08/20/2021 CLINICAL DATA:  Status post left hip replacement EXAM: DG HIP (WITH OR WITHOUT PELVIS) 1V PORT LEFT COMPARISON:  08/18/2021 FINDINGS: Status post interval left total hip arthroplasty. No periprosthetic lucency or fracture. Near anatomic alignment of the prosthetic components. Air within the soft tissues is not unexpected postoperatively. Superficial skin staples. IMPRESSION: Expected postoperative appearance status post left total hip arthroplasty. Electronically Signed   By: Merilyn Baba M.D.   On: 08/20/2021 11:12        Scheduled Meds:  atorvastatin  80 mg Oral Daily   citalopram  10 mg Oral QODAY   clopidogrel  75 mg Oral Daily   docusate sodium  100 mg Oral BID   donepezil  10 mg Oral QHS   enoxaparin (LOVENOX) injection  40 mg Subcutaneous Q24H   famotidine  20 mg Oral Daily    feeding supplement  237 mL Oral BID BM   lactase  1 tablet Oral Daily   lidocaine  1 patch Transdermal Q24H   multivitamin with minerals  1 tablet Oral BID   polyethylene glycol  17 g Oral Daily   senna  1 tablet Oral QHS   Continuous Infusions:  sodium chloride Stopped (08/20/21 2127)     LOS: 3 days     Desma Maxim, MD [facility]   If 7PM-7AM, please contact night-coverage [email or web address] Password [facility] 08/21/2021, 12:14 PM

## 2021-08-21 NOTE — Progress Notes (Signed)
Primary nurse Marcie Bal was floated to another unit.  This nurse has obtained report and will resume care of this patient.

## 2021-08-21 NOTE — Progress Notes (Signed)
Subjective:  POD #1 s/p left hip hemiarthroplasty.   Patient has had a previous stroke and advanced dementia and is unable to provide a history.  She was asleep when I entered the room but awoke while I was checking her dressing.  Patient spoke but her words were undecipherable.  Patient is in no acute distress.  She does not appear to be in pain.  Objective:   VITALS:   Vitals:   08/21/21 0010 08/21/21 0340 08/21/21 0753 08/21/21 0830  BP: (!) 105/58 103/68 (!) 89/63 111/70  Pulse: (!) 53 76 92   Resp: '15 15 16   '$ Temp: 98.1 F (36.7 C) 98.1 F (36.7 C) 98.3 F (36.8 C)   TempSrc:      SpO2: 100% 100% 93%   Weight:      Height:        PHYSICAL EXAM: Left lower extremity: Patient cannot follow commands and her motor or sensory function cannot be determined. Intact pulses distally Honeycomb dressing: moderate sanguinous drainage.  No evidence of active bleeding. No cellulitis present Left thigh compartments soft and compressible  LABS  Results for orders placed or performed during the hospital encounter of 08/18/21 (from the past 24 hour(s))  CBC     Status: Abnormal   Collection Time: 08/21/21  4:41 AM  Result Value Ref Range   WBC 17.0 (H) 4.0 - 10.5 K/uL   RBC 3.21 (L) 3.87 - 5.11 MIL/uL   Hemoglobin 9.5 (L) 12.0 - 15.0 g/dL   HCT 29.6 (L) 36.0 - 46.0 %   MCV 92.2 80.0 - 100.0 fL   MCH 29.6 26.0 - 34.0 pg   MCHC 32.1 30.0 - 36.0 g/dL   RDW 14.4 11.5 - 15.5 %   Platelets 278 150 - 400 K/uL   nRBC 0.0 0.0 - 0.2 %  Basic metabolic panel     Status: Abnormal   Collection Time: 08/21/21  4:41 AM  Result Value Ref Range   Sodium 139 135 - 145 mmol/L   Potassium 4.3 3.5 - 5.1 mmol/L   Chloride 109 98 - 111 mmol/L   CO2 24 22 - 32 mmol/L   Glucose, Bld 89 70 - 99 mg/dL   BUN 42 (H) 8 - 23 mg/dL   Creatinine, Ser 1.22 (H) 0.44 - 1.00 mg/dL   Calcium 8.5 (L) 8.9 - 10.3 mg/dL   GFR, Estimated 44 (L) >60 mL/min   Anion gap 6 5 - 15    MR BRAIN WO CONTRAST  Result  Date: 08/21/2021 CLINICAL DATA:  Mental status change, persistent or worsening; encephalopathy. EXAM: MRI HEAD WITHOUT CONTRAST TECHNIQUE: Multiplanar, multiecho pulse sequences of the brain and surrounding structures were obtained without intravenous contrast. COMPARISON:  Head CT August 18, 2021. FINDINGS: Brain: No acute infarction, hemorrhage, hydrocephalus, extra-axial collection or mass lesion. Areas of encephalomalacia and gliosis in the left temporoparietal region and left cerebellar hemisphere. Scattered foci of T2 hyperintensity are seen within the white matter of the cerebral hemispheres are nonspecific, most likely related to chronic small vessel ischemia. Parenchymal volume loss, advanced in the bilateral temporal lobes. Vascular: Normal flow voids. Skull and upper cervical spine: Normal marrow signal. Sinuses/Orbits: Mucous retention cyst in the left maxillary sinuses. Bilateral surgery. Other: None. IMPRESSION: 1. No acute intracranial abnormality. 2. Areas of encephalomalacia and gliosis in the left cerebellar hemisphere and left temporoparietal region. 3. Parenchymal volume loss, severe in the bilateral temporal lobes. Electronically Signed   By: Sandre Kitty.D.  On: 08/21/2021 15:09   DG Hip Port Unilat With Pelvis 1V Left  Result Date: 08/20/2021 CLINICAL DATA:  Status post left hip replacement EXAM: DG HIP (WITH OR WITHOUT PELVIS) 1V PORT LEFT COMPARISON:  08/18/2021 FINDINGS: Status post interval left total hip arthroplasty. No periprosthetic lucency or fracture. Near anatomic alignment of the prosthetic components. Air within the soft tissues is not unexpected postoperatively. Superficial skin staples. IMPRESSION: Expected postoperative appearance status post left total hip arthroplasty. Electronically Signed   By: Merilyn Baba M.D.   On: 08/20/2021 11:12    Assessment/Plan: 1 Day Post-Op   Principal Problem:   Fracture of femoral neck, left, closed ([place]) Active  Problems:   Hypertension   Essential hypertension   OSA on CPAP   Dementia ([place])   Fall   Left wrist fracture   Pubic ramus fracture, left, closed, initial encounter ([place])   Closed compression fracture of body of L1 vertebra (HCC)   PAF (paroxysmal atrial fibrillation) (HCC)   Depression   HLD (hyperlipidemia)   Obesity with body mass index (BMI) of 30.0 to 39.9   Iron deficiency anemia   Leukocytosis   Chronic kidney disease, stage 3a ([place])   Fracture of radial neck, left, closed   Stroke [facility])  Patient stable postop.  She just returned to the floor from a brain MRI.  Hemoglobin and hematocrit are stable.  Vital signs are stable.  Continue with physical therapy as patient can participate.  Recheck labs in the morning.  Patient is weightbearing as tolerated in left lower extremity with posterior hip precautions.  She will [facility] to [facility] upon discharge.    Thornton [place] , MD 08/21/2021, 3:25 PM

## 2021-08-21 NOTE — Progress Notes (Signed)
Upon assessment, patient found to have no IV fluids running as ordered so new bag of NS hung, IV flushed, now infusing.

## 2021-08-21 NOTE — Evaluation (Signed)
Occupational Therapy Evaluation Patient Details Name: Courtney Lynn MRN: [id number] DOB: July 23, 1940 Today's Date: 08/21/2021   History of Present Illness Pt is an 81 y/o F admitted on 08/18/21 after presenting to the ED 2/2 a fall in her facility & pt injured her head, L hip & L arm. Pt found to have L femoral neck fx & pubic rami fxs. Pt is s/p L hip hemiarthropplasty 08/20/21. Pt also noted to have L radius/ulna fx & radial head fx. PMH: HTN, HLD, stroke, dementia, GERD, depression, OSA on CPAP, mitral valve regurgitation, breast CA (s/p R partial mastectomy & radiation therapy), obesity, anemia, vertigo, PVC, dementia, mild pulmonary HTN, PAF not on anticoagulants   Clinical Impression   Ms Andringa was seen for OT evaluation this date. Pt unable to provide PLOF or home setup, per chart pt ambulatory with RW at [facility]. Pt presents to acute OT demonstrating impaired ADL performance and functional mobility 2/2 decreased activity tolerance and functional strength/ROM/balance deficits. Pt is not oriented to self, place, or time. Makes clinking noises throughout.   Pt currently requires TOTAL A x2 for LB access and don/doff B mitts at bed level. Attempted to exit bed TOTAL A x2, pt resisting and agitated with attempts, returned to supine. Pt would benefit from skilled OT to address noted impairments and functional limitations (see below for any additional details). Upon hospital discharge, recommend STR to maximize pt safety and return to PLOF.  Supine: BP 87/50, MAP 61, HR 40-127  Supine end of session: BP 95/65, MAP 76, HR 89-127      Recommendations for follow up therapy are one component of a multi-disciplinary discharge planning process, led by the attending physician.  Recommendations may be updated based on patient status, additional functional criteria and insurance authorization.   Follow Up Recommendations  Skilled nursing-short term rehab (<3 hours/day)    Assistance Recommended  at Discharge Frequent or constant Supervision/Assistance  Patient can return home with the following Two people to help with walking and/or transfers;Two people to help with bathing/dressing/bathroom    Functional Status Assessment  Patient has had a recent decline in their functional status and demonstrates the ability to make significant improvements in function in a reasonable and predictable amount of time.  Equipment Recommendations  Other (comment) (defer)    Recommendations for Other Services       Precautions / Restrictions Precautions Precautions: Fall Restrictions Weight Bearing Restrictions: Yes RUE Weight Bearing: Weight bear through elbow only (can use platform RW) RLE Weight Bearing: Weight bearing as tolerated      Mobility Bed Mobility Overal bed mobility: Needs Assistance             General bed mobility comments: Attempted to exit bed TOTAL A x2, pt resisting and agitated with attempts,  returned to supine    Transfers                   General transfer comment: unsafe to attempt          ADL either performed or assessed with clinical judgement   ADL Overall ADL's : Needs assistance/impaired                                       General ADL Comments: TOTAL A x2 for LB access and don/doff B mitts at bed level.      Pertinent Vitals/Pain Pain Assessment Pain  Assessment: Faces Faces Pain Scale: Hurts little more Pain Location: RLE Pain Descriptors / Indicators: Discomfort, Grimacing Pain Intervention(s): Limited activity within patient's tolerance, Repositioned     Hand Dominance     Extremity/Trunk Assessment Upper Extremity Assessment Upper Extremity Assessment: Generalized weakness;Difficult to assess due to impaired cognition   Lower Extremity Assessment Lower Extremity Assessment: Generalized weakness;Difficult to assess due to impaired cognition       Communication Communication Communication:  Receptive difficulties;Expressive difficulties;HOH (Pt frequently making clicking noises throughout session, pointing to objects on wall, does not respond to name, occasionally verbalizes coherent words but not in contecxt that can be understood)   Cognition Arousal/Alertness: Awake/alert Behavior During Therapy: Restless Overall Cognitive Status: No family/caregiver present to determine baseline cognitive functioning                                                  Home Living Family/patient expects to be discharged to:: Skilled nursing facility                                 Additional Comments: [facility], long term care      Prior Functioning/Environment Prior Level of Function : Patient poor historian/Family not available             Mobility Comments: unsure, pt unable to report ADLs Comments: pt unable to reportper chart, mobile with RW        OT Problem List: Decreased strength;Decreased range of motion;Decreased activity tolerance;Impaired balance (sitting and/or standing);Decreased safety awareness      OT Treatment/Interventions: Self-care/ADL training;Therapeutic exercise;Energy conservation;DME and/or AE instruction;Therapeutic activities;Patient/family education;Balance training    OT Goals(Current goals can be found in the care plan section) Acute Rehab OT Goals Patient Stated Goal: none stated OT Goal Formulation: Patient unable to participate in goal setting Time For Goal Achievement: 09/04/21 Potential to Achieve Goals: Poor ADL Goals Pt Will Perform Grooming: bed level;with min assist Pt Will Perform Lower Body Dressing: sitting/lateral leans;with max assist Pt Will Transfer to Toilet: with max assist;with +2 assist (bed level)  OT Frequency: Min 1X/week    Co-evaluation PT/OT/SLP Co-Evaluation/Treatment: Yes Reason for Co-Treatment: Complexity of the patient's impairments (multi-system involvement);Necessary to  address cognition/behavior during functional activity;For patient/therapist safety;To address functional/ADL transfers PT goals addressed during session: Mobility/safety with mobility OT goals addressed during session: ADL's and self-care      AM-PAC OT "6 Clicks" Daily Activity     Outcome Measure Help from another person eating meals?: A Lot Help from another person taking care of personal grooming?: A Lot Help from another person toileting, which includes using toliet, bedpan, or urinal?: Total Help from another person bathing (including washing, rinsing, drying)?: Total Help from another person to put on and taking off regular upper body clothing?: A Lot Help from another person to put on and taking off regular lower body clothing?: Total 6 Click Score: 9   End of Session    Activity Tolerance: Treatment limited secondary to agitation Patient left: in bed;with call bell/phone within reach;with bed alarm set  OT Visit Diagnosis: Unsteadiness on feet (R26.81);Muscle weakness (generalized) (M62.81)                Time: 6812-7517 OT Time Calculation (min): 15 min Charges:  OT General Charges $OT Visit:  1 Visit OT Evaluation $OT Eval Moderate Complexity: 1 Mod  Dessie Coma, M.S. OTR/L  08/21/21, 1:09 PM  ascom [phone]

## 2021-08-21 NOTE — Progress Notes (Signed)
Nurse received report from prior nurse at 2330 that the patient has been in Afib and heart rate has been fluctuating from normal sinus to sinus tach as high as 135. Prior nurse informed the current primary nurse that the "providers" were aware. Nurse chat paged Dr. Mack Guise to follow up to make him aware again that the heart rate has been in the 80s as high as 124.  Nurse has not received any communication back yet.  Nurse will pass this information on to the day shift nurse.

## 2021-08-22 DIAGNOSIS — S72002A Fracture of unspecified part of neck of left femur, initial encounter for closed fracture: Secondary | ICD-10-CM | POA: Diagnosis not present

## 2021-08-22 LAB — BASIC METABOLIC PANEL
Anion gap: 7 (ref 5–15)
BUN: 52 mg/dL — ABNORMAL HIGH (ref 8–23)
CO2: 25 mmol/L (ref 22–32)
Calcium: 8.6 mg/dL — ABNORMAL LOW (ref 8.9–10.3)
Chloride: 107 mmol/L (ref 98–111)
Creatinine, Ser: 1.26 mg/dL — ABNORMAL HIGH (ref 0.44–1.00)
GFR, Estimated: 43 mL/min — ABNORMAL LOW (ref >60)
Glucose, Bld: 88 mg/dL (ref 70–99)
Potassium: 4.9 mmol/L (ref 3.5–5.1)
Sodium: 139 mmol/L (ref 135–145)

## 2021-08-22 LAB — CBC
HCT: 30 % — ABNORMAL LOW (ref 36.0–46.0)
Hemoglobin: 9.7 g/dL — ABNORMAL LOW (ref 12.0–15.0)
MCH: 29.5 pg (ref 26.0–34.0)
MCHC: 32.3 g/dL (ref 30.0–36.0)
MCV: 91.2 fL (ref 80.0–100.0)
Platelets: 306 10*3/uL (ref 150–400)
RBC: 3.29 MIL/uL — ABNORMAL LOW (ref 3.87–5.11)
RDW: 14.4 % (ref 11.5–15.5)
WBC: 14.5 10*3/uL — ABNORMAL HIGH (ref 4.0–10.5)
nRBC: 0 % (ref 0.0–0.2)

## 2021-08-22 LAB — GLUCOSE, CAPILLARY
Glucose-Capillary: 87 mg/dL (ref 70–99)
Glucose-Capillary: 107 mg/dL — ABNORMAL HIGH (ref 70–99)

## 2021-08-22 MED ORDER — DEXTROSE-NACL 5-0.45 % IV SOLN: INTRAVENOUS | Status: DC

## 2021-08-22 MED ORDER — CARVEDILOL 3.125 MG PO TABS
3.1250 mg | ORAL_TABLET | Freq: Two times a day (BID) | ORAL | Status: DC
  Administered 2021-08-22 – 2021-08-23 (×2): 3.125 mg via ORAL
  Filled 2021-08-22: qty 1

## 2021-08-22 NOTE — NC FL2 (Deleted)
[place] LEVEL OF CARE SCREENING TOOL     IDENTIFICATION  Patient Name: Courtney Lynn Birthdate: 1940-07-04 Sex: female Admission Date (Current Location): 08/18/2021  [facility] and [place] Number:  Engineering geologist and Address:  [facility], [address] [place], [place] [address]      Provider Number: [id number]  Attending Physician Name and Address:  Gwynne Edinger, MD  Relative Name and Phone Number:  Gloris Ham [facility] [place])   [phone] [facility] Phone)    Current Level of Care: Hospital Recommended Level of Care: Other (Comment) (Current at LTC at [facility] facility) Prior Approval Number: [id number]  Date Approved/Denied: 09/29/20 PASRR Number: [id number]  Discharge Plan: Other (Comment) (LTC)    Current Diagnoses: Patient Active Problem List   Diagnosis Date Noted   Fracture of femoral neck, left, closed ([place]) 08/18/2021   Left wrist fracture 08/18/2021   Pubic ramus fracture, left, closed, initial encounter ([place]) 08/18/2021   Closed compression fracture of body of L1 vertebra (HCC) 08/18/2021   PAF (paroxysmal atrial fibrillation) ([place]) 08/18/2021   Depression 08/18/2021   HLD (hyperlipidemia) 08/18/2021   Obesity with body mass index (BMI) of 30.0 to 39.9 08/18/2021   Iron deficiency anemia 08/18/2021   Leukocytosis 08/18/2021   Chronic kidney disease, stage 3a ([place]) 08/18/2021   Fracture of radial neck, left, closed 08/18/2021   Stroke ([place]) 08/18/2021   Acute encephalopathy 09/25/2020   Fall 07/14/2020   Intracranial bleed ([place]) 07/13/2020   Dementia ([place]) 07/13/2020   Closed dislocation of right thumb 07/13/2020   AKI (acute kidney injury) ([place]) 07/13/2020   Syncope and collapse 07/13/2020   Major depressive disorder, recurrent, in remission ([place]) 10/17/2018   Loss of memory 03/14/2018   Poorly-controlled hypertension 01/05/2018   Hemorrhoids 12/15/2017   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   Postmenopausal  osteoporosis 11/23/2017   Lumbar radiculopathy 07/14/2017   Osteopenia of the elderly 05/24/2017   Dizziness 12/13/2015   Intermittent vertigo 12/13/2015   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 ([place]) 12/07/2015   Fracture of left foot 10/26/2015   OSA on CPAP 04/14/2015   Nonrheumatic mitral valve insufficiency 09/10/2014   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[place]) 09/10/2014   Nonrheumatic mitral valve prolapse 09/10/2014   Premature ventricular contractions 09/10/2014   Other secondary pulmonary hypertension ([place]) 09/10/2014   Ventricular premature depolarization 09/10/2014   Bradycardia 07/01/2014   Hypertension 07/01/2014   Hyponatremia 07/01/2014   Hyposmolality and/or hyponatremia 07/01/2014   Chronic anemia 04/23/2014   Anemia 04/23/2014   Body mass index (BMI) of 37.0-37.9 in adult 03/28/2014   Chronic insomnia 03/28/2014   Essential hypertension 03/28/2014   Gastroesophageal reflux disease without esophagitis 03/28/2014   Pre-diabetes 03/28/2014   Primary osteoarthritis of both knees 03/28/2014   Insomnia 03/28/2014   Depression, major, single episode, complete remission ([place]) 03/28/2014   Other obesity 03/28/2014   Acute bronchitis, viral 01/30/2014    Orientation RESPIRATION BLADDER Height & Weight      (Advanced dementia and stroke)  Normal  (Has had urinary retention) Weight: 84 kg Height:  '5\' 1"'$  (154.9 cm)  BEHAVIORAL SYMPTOMS/MOOD NEUROLOGICAL BOWEL NUTRITION STATUS       (No BM recorded since admission on 08/18/21) Diet (Dysphagia)  AMBULATORY STATUS COMMUNICATION OF NEEDS Skin   Extensive Assist Does not communicate Surgical wounds                       Personal Care Assistance Level of Assistance  Bathing, Feeding, Dressing Bathing Assistance: Maximum assistance Feeding assistance: Maximum assistance Dressing Assistance: Maximum assistance     Functional Limitations Info  Sight Sight Info: Impaired (Corrective glasses)         SPECIAL CARE FACTORS FREQUENCY                       Contractures Contractures Info:  (LUE splint with limited movement)    Additional Factors Info  Code Status Code Status Info: DNR, please see chart for most uptodate information.             Current Medications (08/22/2021):  This is the current hospital active medication list Current Facility-Administered Medications  Medication Dose Route Frequency Provider Last Rate Last Admin   acetaminophen (TYLENOL) tablet 650 mg  650 mg Oral Q6H PRN [place] Park, MD       alum & mag hydroxide-simeth (MAALOX/MYLANTA) 200-200-20 MG/5ML suspension 30 mL  30 mL Oral Q4H PRN Thornton Park, MD       atorvastatin (LIPITOR) tablet 80 mg  80 mg Oral Daily Thornton Park, MD   80 mg at 08/22/21 1049   bisacodyl (DULCOLAX) suppository 10 mg  10 mg Rectal Daily PRN Thornton Park, MD       carvedilol (COREG) tablet 3.125 mg  3.125 mg Oral BID WC Wouk, Ailene Rud, MD       citalopram (CELEXA) tablet 10 mg  10 mg Oral Pollyann Kennedy, NP   10 mg at 08/22/21 1049   clopidogrel (PLAVIX) tablet 75 mg  75 mg Oral Daily Thornton Park, MD   75 mg at 08/22/21 1049   dextrose 5 %-0.45 % sodium chloride infusion   Intravenous Continuous Wouk, Ailene Rud, MD       docusate sodium (COLACE) capsule 100 mg  100 mg Oral BID Thornton Park, MD   100 mg at 08/22/21 1049   donepezil (ARICEPT) tablet 10 mg  10 mg Oral QHS Thornton Park, MD   10 mg at 08/21/21 2130   enoxaparin (LOVENOX) injection 40 mg  40 mg Subcutaneous Q24H Thornton Park, MD   40 mg at 08/22/21 1049   famotidine (PEPCID) tablet 20 mg  20 mg Oral Daily Thornton Park, MD   20 mg at 08/22/21 1049   feeding supplement (ENSURE ENLIVE / ENSURE PLUS) liquid 237 mL  237 mL Oral BID BM Thornton Park, MD   237 mL at 08/22/21 1302   lactase (LACTAID) tablet 3,000 Units  1 tablet Oral Daily Thornton Park, MD   3,000 Units at 08/22/21 1050   lidocaine (LIDODERM) 5  % 1 patch  1 patch Transdermal Q24H Thornton Park, MD   1 patch at 08/21/21 1244   menthol-cetylpyridinium (CEPACOL) lozenge 3 mg  1 lozenge Oral PRN Thornton Park, MD       Or   phenol (CHLORASEPTIC) mouth spray 1 spray  1 spray Mouth/Throat PRN Thornton Park, MD       multivitamin with minerals tablet 1 tablet  1 tablet Oral BID Thornton Park, MD   1 tablet at 08/22/21 1049   ondansetron (ZOFRAN) tablet 4 mg  4 mg Oral Q6H PRN Thornton Park, MD       Or   ondansetron [facility]) injection 4 mg  4 mg Intravenous Q6H PRN Thornton Park, MD       polyethylene glycol (MIRALAX / GLYCOLAX) packet 17 g  17 g Oral Daily Thornton Park, MD   17 g at 08/22/21 1049  polyethylene glycol (MIRALAX / GLYCOLAX) packet 17 g  17 g Oral Daily PRN Thornton Park, MD       senna Donavan Burnet) tablet 8.6 mg  1 tablet Oral QHS Thornton Park, MD   8.6 mg at 08/21/21 2130     Discharge Medications: Please see discharge summary for a list of discharge medications.  Relevant Imaging Results:  Relevant Lab Results:   Additional Information SS# [id number] radius/ulna fracture, radial head fracture Splint present  Izola Price, RN

## 2021-08-22 NOTE — TOC Progression Note (Addendum)
Transition of Care [facility]) - Progression Note    Patient Details  Name: Courtney Lynn MRN: [id number] Date of Birth: 11/15/1940  Transition of Care [facility]) CM/SW Contact  Izola Price, RN Phone Number: 08/22/2021, 2:53 PM  Clinical Narrative:  7/22: Patient to return to LTC at [facility]. New FL2 sent and insurance authorization requested by [facility] and now pending after contacting [employer]. AC at [facility] will take this weeked once authorization obtained. AC requested to include PT/OT on FL2 even though patient returning to LTC setting.  Will need to transport via EMS. Patient DNR, not comfort care, and palliative to follow on outpatient basis. Updated provider and AC at facility. Simmie Davies RN CM      Expected Discharge Plan: Skilled Nursing Facility Barriers to Discharge: Ship broker  Expected Discharge Plan and Services Expected Discharge Plan: [place]       Living arrangements for the past 2 months[place]                 DME Arranged: N/A DME Agency: NA       HH Arranged: NA HH Agency: NA         Social Determinants of Health (SDOH) Interventions    Readmission Risk Interventions     No data to display

## 2021-08-22 NOTE — NC FL2 (Signed)
[place] LEVEL OF CARE SCREENING TOOL     IDENTIFICATION  Patient Name: Courtney Lynn Birthdate: Jan 08, 1941 Sex: female Admission Date (Current Location): 08/18/2021  [facility] and [place] Number:  Engineering geologist and Address:  [facility], [address], [place], [place] [address]      Provider Number: [id number]  Attending Physician Name and Address:  Gwynne Edinger, MD  Relative Name and Phone Number:  Gloris Ham [facility])   [phone] [facility] Phone)    Current Level of Care: Hospital Recommended Level of Care: Other (Comment) (Current at LTC at [facility] facility) Prior Approval Number: [id number]  Date Approved/Denied: 09/29/20 PASRR Number: [id number]  Discharge Plan: Other (Comment) (LTC)    Current Diagnoses: Patient Active Problem List   Diagnosis Date Noted   Fracture of femoral neck, left, closed ([place]) 08/18/2021   Left wrist fracture 08/18/2021   Pubic ramus fracture, left, closed, initial encounter ([place]) 08/18/2021   Closed compression fracture of body of L1 vertebra (HCC) 08/18/2021   PAF (paroxysmal atrial fibrillation) ([place]) 08/18/2021   Depression 08/18/2021   HLD (hyperlipidemia) 08/18/2021   Obesity with body mass index (BMI) of 30.0 to 39.9 08/18/2021   Iron deficiency anemia 08/18/2021   Leukocytosis 08/18/2021   Chronic kidney disease, stage 3a ([place]) 08/18/2021   Fracture of radial neck, left, closed 08/18/2021   Stroke ([place]) 08/18/2021   Acute encephalopathy 09/25/2020   Fall 07/14/2020   Intracranial bleed ([place]) 07/13/2020   Dementia ([place]) 07/13/2020   Closed dislocation of right thumb 07/13/2020   AKI (acute kidney injury) ([place]) 07/13/2020   Syncope and collapse 07/13/2020   Major depressive disorder, recurrent, in remission ([place]) 10/17/2018   Loss of memory 03/14/2018   Poorly-controlled hypertension 01/05/2018   Hemorrhoids 12/15/2017   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   Postmenopausal  osteoporosis 11/23/2017   Lumbar radiculopathy 07/14/2017   Osteopenia of the elderly 05/24/2017   Dizziness 12/13/2015   Intermittent vertigo 12/13/2015   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 ([place]) 12/07/2015   Fracture of left foot 10/26/2015   OSA on CPAP 04/14/2015   Nonrheumatic mitral valve insufficiency 09/10/2014   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[place]) 09/10/2014   Nonrheumatic mitral valve prolapse 09/10/2014   Premature ventricular contractions 09/10/2014   Other secondary pulmonary hypertension ([place]) 09/10/2014   Ventricular premature depolarization 09/10/2014   Bradycardia 07/01/2014   Hypertension 07/01/2014   Hyponatremia 07/01/2014   Hyposmolality and/or hyponatremia 07/01/2014   Chronic anemia 04/23/2014   Anemia 04/23/2014   Body mass index (BMI) of 37.0-37.9 in adult 03/28/2014   Chronic insomnia 03/28/2014   Essential hypertension 03/28/2014   Gastroesophageal reflux disease without esophagitis 03/28/2014   Pre-diabetes 03/28/2014   Primary osteoarthritis of both knees 03/28/2014   Insomnia 03/28/2014   Depression, major, single episode, complete remission ([place]) 03/28/2014   Other obesity 03/28/2014   Acute bronchitis, viral 01/30/2014    Orientation RESPIRATION BLADDER Height & Weight      (Advanced dementia and stroke)  Normal  (Has had urinary retention) Weight: 84 kg Height:  '5\' 1"'$  (154.9 cm)  BEHAVIORAL SYMPTOMS/MOOD NEUROLOGICAL BOWEL NUTRITION STATUS       (No BM recorded since admission on 08/18/21) Diet (Dysphagia)  AMBULATORY STATUS COMMUNICATION OF NEEDS Skin   Extensive Assist Does not communicate Surgical wounds                       Personal Care Assistance Level of Assistance  Bathing, Feeding, Dressing Bathing Assistance: Maximum assistance Feeding assistance: Maximum assistance Dressing Assistance: Maximum assistance     Functional Limitations Info  Sight Sight Info: Impaired (Corrective glasses)         SPECIAL CARE FACTORS FREQUENCY                       Contractures Contractures Info:  (LUE splint with limited movement)    Additional Factors Info  Code Status Code Status Info: DNR, please see chart for most uptodate information.             Current Medications (08/22/2021):  This is the current hospital active medication list Current Facility-Administered Medications  Medication Dose Route Frequency Provider Last Rate Last Admin   acetaminophen (TYLENOL) tablet 650 mg  650 mg Oral Q6H PRN [place] Park, MD       alum & mag hydroxide-simeth (MAALOX/MYLANTA) 200-200-20 MG/5ML suspension 30 mL  30 mL Oral Q4H PRN Thornton Park, MD       atorvastatin (LIPITOR) tablet 80 mg  80 mg Oral Daily Thornton Park, MD   80 mg at 08/22/21 1049   bisacodyl (DULCOLAX) suppository 10 mg  10 mg Rectal Daily PRN Thornton Park, MD       carvedilol (COREG) tablet 3.125 mg  3.125 mg Oral BID WC Gwynne Edinger, MD   3.125 mg at 08/22/21 1409   citalopram (CELEXA) tablet 10 mg  10 mg Oral Pollyann Kennedy, NP   10 mg at 08/22/21 1049   clopidogrel (PLAVIX) tablet 75 mg  75 mg Oral Daily Thornton Park, MD   75 mg at 08/22/21 1049   dextrose 5 %-0.45 % sodium chloride infusion   Intravenous Continuous Gwynne Edinger, MD 125 mL/hr at 08/22/21 1413 New Bag at 08/22/21 1413   docusate sodium (COLACE) capsule 100 mg  100 mg Oral BID Thornton Park, MD   100 mg at 08/22/21 1049   donepezil (ARICEPT) tablet 10 mg  10 mg Oral QHS Thornton Park, MD   10 mg at 08/21/21 2130   enoxaparin (LOVENOX) injection 40 mg  40 mg Subcutaneous Q24H Thornton Park, MD   40 mg at 08/22/21 1049   famotidine (PEPCID) tablet 20 mg  20 mg Oral Daily Thornton Park, MD   20 mg at 08/22/21 1049   feeding supplement (ENSURE ENLIVE / ENSURE PLUS) liquid 237 mL  237 mL Oral BID BM Thornton Park, MD   237 mL at 08/22/21 1302   lactase (LACTAID) tablet 3,000 Units  1 tablet Oral Daily  Thornton Park, MD   3,000 Units at 08/22/21 1050   lidocaine (LIDODERM) 5 % 1 patch  1 patch Transdermal Q24H Thornton Park, MD   1 patch at 08/22/21 1409   menthol-cetylpyridinium (CEPACOL) lozenge 3 mg  1 lozenge Oral PRN Thornton Park, MD       Or   phenol (CHLORASEPTIC) mouth spray 1 spray  1 spray Mouth/Throat PRN Thornton Park, MD       multivitamin with minerals tablet 1 tablet  1 tablet Oral BID [place] Park, MD   1 tablet at 08/22/21 1049   ondansetron (ZOFRAN) tablet 4 mg  4 mg Oral Q6H PRN Thornton Park, MD       Or   ondansetron [facility]) injection 4 mg  4 mg Intravenous Q6H PRN Thornton Park, MD       polyethylene glycol (MIRALAX / GLYCOLAX) packet 17 g  17 g Oral Daily Thornton Park, MD  17 g at 08/22/21 1049   polyethylene glycol (MIRALAX / GLYCOLAX) packet 17 g  17 g Oral Daily PRN Thornton Park, MD       senna Donavan Burnet) tablet 8.6 mg  1 tablet Oral QHS Thornton Park, MD   8.6 mg at 08/21/21 2130     Discharge Medications: Please see discharge summary for a list of discharge medications.  Relevant Imaging Results:  Relevant Lab Results:   Additional Information SS# [id number] radius/ulna fracture, radial head fracture Splint present  Izola Price, RN

## 2021-08-22 NOTE — NC FL2 (Deleted)
[place] LEVEL OF CARE SCREENING TOOL     IDENTIFICATION  Patient Name: Courtney Lynn Birthdate: 31-Aug-1940 Sex: female Admission Date (Current Location): 08/18/2021  [facility] and [place] Number:  Engineering geologist and Address:  [facility], [address] [place], [place] [address]      Provider Number: [id number]  Attending Physician Name and Address:  Gwynne Edinger, MD  Relative Name and Phone Number:  Gloris Ham [facility])   [phone] [facility] Phone)    Current Level of Care: Hospital Recommended Level of Care: Other (Comment) (Current at LTC at [facility] facility) Prior Approval Number: [id number]  Date Approved/Denied: 09/29/20 PASRR Number: [id number]  Discharge Plan: Other (Comment) (LTC)    Current Diagnoses: Patient Active Problem List   Diagnosis Date Noted   Fracture of femoral neck, left, closed ([place]) 08/18/2021   Left wrist fracture 08/18/2021   Pubic ramus fracture, left, closed, initial encounter ([place]) 08/18/2021   Closed compression fracture of body of L1 vertebra (HCC) 08/18/2021   PAF (paroxysmal atrial fibrillation) ([place]) 08/18/2021   Depression 08/18/2021   HLD (hyperlipidemia) 08/18/2021   Obesity with body mass index (BMI) of 30.0 to 39.9 08/18/2021   Iron deficiency anemia 08/18/2021   Leukocytosis 08/18/2021   Chronic kidney disease, stage 3a ([place]) 08/18/2021   Fracture of radial neck, left, closed 08/18/2021   Stroke ([place]) 08/18/2021   Acute encephalopathy 09/25/2020   Fall 07/14/2020   Intracranial bleed ([place]) 07/13/2020   Dementia ([place]) 07/13/2020   Closed dislocation of right thumb 07/13/2020   AKI (acute kidney injury) ([place]) 07/13/2020   Syncope and collapse 07/13/2020   Major depressive disorder, recurrent, in remission ([place]) 10/17/2018   Loss of memory 03/14/2018   Poorly-controlled hypertension 01/05/2018   Hemorrhoids 12/15/2017   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   Postmenopausal  osteoporosis 11/23/2017   Lumbar radiculopathy 07/14/2017   Osteopenia of the elderly 05/24/2017   Dizziness 12/13/2015   Intermittent vertigo 12/13/2015   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 ([place]) 12/07/2015   Fracture of left foot 10/26/2015   OSA on CPAP 04/14/2015   Nonrheumatic mitral valve insufficiency 09/10/2014   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[place]) 09/10/2014   Nonrheumatic mitral valve prolapse 09/10/2014   Premature ventricular contractions 09/10/2014   Other secondary pulmonary hypertension ([place]) 09/10/2014   Ventricular premature depolarization 09/10/2014   Bradycardia 07/01/2014   Hypertension 07/01/2014   Hyponatremia 07/01/2014   Hyposmolality and/or hyponatremia 07/01/2014   Chronic anemia 04/23/2014   Anemia 04/23/2014   Body mass index (BMI) of 37.0-37.9 in adult 03/28/2014   Chronic insomnia 03/28/2014   Essential hypertension 03/28/2014   Gastroesophageal reflux disease without esophagitis 03/28/2014   Pre-diabetes 03/28/2014   Primary osteoarthritis of both knees 03/28/2014   Insomnia 03/28/2014   Depression, major, single episode, complete remission ([place]) 03/28/2014   Other obesity 03/28/2014   Acute bronchitis, viral 01/30/2014    Orientation RESPIRATION BLADDER Height & Weight      (Advanced dementia and stroke)  Normal  (Has had urinary retention) Weight: 84 kg Height:  '5\' 1"'$  (154.9 cm)  BEHAVIORAL SYMPTOMS/MOOD NEUROLOGICAL BOWEL NUTRITION STATUS       (No BM recorded since admission on 08/18/21) Diet (Dysphagia)  AMBULATORY STATUS COMMUNICATION OF NEEDS Skin   Extensive Assist Does not communicate Surgical wounds                       Personal Care Assistance Level of Assistance  Bathing, Feeding, Dressing Bathing Assistance: Maximum assistance Feeding assistance: Maximum assistance Dressing Assistance: Maximum assistance     Functional Limitations Info  Sight Sight Info: Impaired (Corrective glasses)         SPECIAL CARE FACTORS FREQUENCY                       Contractures Contractures Info:  (LUE splint with limited movement)    Additional Factors Info  Code Status Code Status Info: DNR, please see chart for most uptodate information.             Current Medications (08/22/2021):  This is the current hospital active medication list Current Facility-Administered Medications  Medication Dose Route Frequency Provider Last Rate Last Admin   acetaminophen (TYLENOL) tablet 650 mg  650 mg Oral Q6H PRN Thornton Park, MD       alum & mag hydroxide-simeth (MAALOX/MYLANTA) 200-200-20 MG/5ML suspension 30 mL  30 mL Oral Q4H PRN Thornton Park, MD       atorvastatin (LIPITOR) tablet 80 mg  80 mg Oral Daily Thornton Park, MD   80 mg at 08/22/21 1049   bisacodyl (DULCOLAX) suppository 10 mg  10 mg Rectal Daily PRN Thornton Park, MD       carvedilol (COREG) tablet 3.125 mg  3.125 mg Oral BID WC Wouk, Ailene Rud, MD       citalopram (CELEXA) tablet 10 mg  10 mg Oral Pollyann Kennedy, NP   10 mg at 08/22/21 1049   clopidogrel (PLAVIX) tablet 75 mg  75 mg Oral Daily Thornton Park, MD   75 mg at 08/22/21 1049   dextrose 5 %-0.45 % sodium chloride infusion   Intravenous Continuous Wouk, Ailene Rud, MD       docusate sodium (COLACE) capsule 100 mg  100 mg Oral BID Thornton Park, MD   100 mg at 08/22/21 1049   donepezil (ARICEPT) tablet 10 mg  10 mg Oral QHS Thornton Park, MD   10 mg at 08/21/21 2130   enoxaparin (LOVENOX) injection 40 mg  40 mg Subcutaneous Q24H Thornton Park, MD   40 mg at 08/22/21 1049   famotidine (PEPCID) tablet 20 mg  20 mg Oral Daily Thornton Park, MD   20 mg at 08/22/21 1049   feeding supplement (ENSURE ENLIVE / ENSURE PLUS) liquid 237 mL  237 mL Oral BID BM Thornton Park, MD   237 mL at 08/22/21 1302   lactase (LACTAID) tablet 3,000 Units  1 tablet Oral Daily Thornton Park, MD   3,000 Units at 08/22/21 1050   lidocaine (LIDODERM) 5  % 1 patch  1 patch Transdermal Q24H Thornton Park, MD   1 patch at 08/21/21 1244   menthol-cetylpyridinium (CEPACOL) lozenge 3 mg  1 lozenge Oral PRN Thornton Park, MD       Or   phenol (CHLORASEPTIC) mouth spray 1 spray  1 spray Mouth/Throat PRN Thornton Park, MD       multivitamin with minerals tablet 1 tablet  1 tablet Oral BID Thornton Park, MD   1 tablet at 08/22/21 1049   ondansetron (ZOFRAN) tablet 4 mg  4 mg Oral Q6H PRN Thornton Park, MD       Or   ondansetron [facility]) injection 4 mg  4 mg Intravenous Q6H PRN Thornton Park, MD       polyethylene glycol (MIRALAX / GLYCOLAX) packet 17 g  17 g Oral Daily Thornton Park, MD   17 g at 08/22/21 1049  polyethylene glycol (MIRALAX / GLYCOLAX) packet 17 g  17 g Oral Daily PRN Thornton Park, MD       senna Donavan Burnet) tablet 8.6 mg  1 tablet Oral QHS Thornton Park, MD   8.6 mg at 08/21/21 2130     Discharge Medications: Please see discharge summary for a list of discharge medications.  Relevant Imaging Results:  Relevant Lab Results:   Additional Information SS# [id number]  Izola Price, RN

## 2021-08-22 NOTE — Progress Notes (Signed)
Subjective:  POD #2 s/p left hip hemiarthroplasty.    Patient has had a previous stroke and advanced dementia and is unable to provide a history.  Patient awake.  IV team in the room to place a new I.  The patient is in no acute distress.  She does not appear to be in pain.  Objective:   VITALS:   Vitals:   08/21/21 2109 08/22/21 0000 08/22/21 0518 08/22/21 0824  BP: 103/71 108/87 119/82 120/81  Pulse: 99 88 86 92  Resp: '16 16 17 16  '$ Temp: 97.8 F (36.6 C) 98.3 F (36.8 C) 98.9 F (37.2 C) (!) 97.2 F (36.2 C)  TempSrc:      SpO2: 94% 92% 97% 95%  Weight:      Height:        PHYSICAL EXAM: Left lower extremity Neurovascular intact Sensation intact distally Intact pulses distally Dorsiflexion/Plantar flexion intact Honeycomb dressing: moderate serosanguineous drainage.  No active bleeding. No cellulitis present Compartment soft  LABS  Results for orders placed or performed during the hospital encounter of 08/18/21 (from the past 24 hour(s))  CBC     Status: Abnormal   Collection Time: 08/22/21  4:29 AM  Result Value Ref Range   WBC 14.5 (H) 4.0 - 10.5 K/uL   RBC 3.29 (L) 3.87 - 5.11 MIL/uL   Hemoglobin 9.7 (L) 12.0 - 15.0 g/dL   HCT 30.0 (L) 36.0 - 46.0 %   MCV 91.2 80.0 - 100.0 fL   MCH 29.5 26.0 - 34.0 pg   MCHC 32.3 30.0 - 36.0 g/dL   RDW 14.4 11.5 - 15.5 %   Platelets 306 150 - 400 K/uL   nRBC 0.0 0.0 - 0.2 %  Basic metabolic panel     Status: Abnormal   Collection Time: 08/22/21  4:29 AM  Result Value Ref Range   Sodium 139 135 - 145 mmol/L   Potassium 4.9 3.5 - 5.1 mmol/L   Chloride 107 98 - 111 mmol/L   CO2 25 22 - 32 mmol/L   Glucose, Bld 88 70 - 99 mg/dL   BUN 52 (H) 8 - 23 mg/dL   Creatinine, Ser 1.26 (H) 0.44 - 1.00 mg/dL   Calcium 8.6 (L) 8.9 - 10.3 mg/dL   GFR, Estimated 43 (L) >60 mL/min   Anion gap 7 5 - 15  Glucose, capillary     Status: None   Collection Time: 08/22/21  8:02 AM  Result Value Ref Range   Glucose-Capillary 87 70 - 99  mg/dL    MR BRAIN WO CONTRAST  Result Date: 08/21/2021 CLINICAL DATA:  Mental status change, persistent or worsening; encephalopathy. EXAM: MRI HEAD WITHOUT CONTRAST TECHNIQUE: Multiplanar, multiecho pulse sequences of the brain and surrounding structures were obtained without intravenous contrast. COMPARISON:  Head CT August 18, 2021. FINDINGS: Brain: No acute infarction, hemorrhage, hydrocephalus, extra-axial collection or mass lesion. Areas of encephalomalacia and gliosis in the left temporoparietal region and left cerebellar hemisphere. Scattered foci of T2 hyperintensity are seen within the white matter of the cerebral hemispheres are nonspecific, most likely related to chronic small vessel ischemia. Parenchymal volume loss, advanced in the bilateral temporal lobes. Vascular: Normal flow voids. Skull and upper cervical spine: Normal marrow signal. Sinuses/Orbits: Mucous retention cyst in the left maxillary sinuses. Bilateral surgery. Other: None. IMPRESSION: 1. No acute intracranial abnormality. 2. Areas of encephalomalacia and gliosis in the left cerebellar hemisphere and left temporoparietal region. 3. Parenchymal volume loss, severe in the bilateral temporal lobes. Electronically  Signed   By: Pedro Earls M.D.   On: 08/21/2021 15:09   DG Hip Port Unilat With Pelvis 1V Left  Result Date: 08/20/2021 CLINICAL DATA:  Status post left hip replacement EXAM: DG HIP (WITH OR WITHOUT PELVIS) 1V PORT LEFT COMPARISON:  08/18/2021 FINDINGS: Status post interval left total hip arthroplasty. No periprosthetic lucency or fracture. Near anatomic alignment of the prosthetic components. Air within the soft tissues is not unexpected postoperatively. Superficial skin staples. IMPRESSION: Expected postoperative appearance status post left total hip arthroplasty. Electronically Signed   By: Merilyn Baba M.D.   On: 08/20/2021 11:12    Assessment/Plan: 2 Days Post-Op   Principal Problem:   Fracture of  femoral neck, left, closed ([place]) Active Problems:   Hypertension   Essential hypertension   OSA on CPAP   Dementia ([place])   Fall   Left wrist fracture   Pubic ramus fracture, left, closed, initial encounter ([place])   Closed compression fracture of body of L1 vertebra (HCC)   PAF (paroxysmal atrial fibrillation) (HCC)   Depression   HLD (hyperlipidemia)   Obesity with body mass index (BMI) of 30.0 to 39.9   Iron deficiency anemia   Leukocytosis   Chronic kidney disease, stage 3a ([place])   Fracture of radial neck, left, closed   Stroke [facility])  Patient stable postop.  Hemoglobin and hematocrit are stable.  Vital signs are stable.  Continue with physical therapy as patient can participate. Patient is weightbearing as tolerated in left lower extremity with posterior hip precautions.  She will [facility] to [facility] upon discharge.      Th[place]ton [place] , MD 08/22/2021, 8:39 AM

## 2021-08-22 NOTE — Plan of Care (Signed)
  Problem: Nutrition: Goal: Adequate nutrition will be maintained 08/22/2021 1534 by Jaking Thayer Bet, LPN Outcome: Progressing 08/22/2021 1534 by Courteny Egler Bet, LPN Outcome: Progressing   Problem: Elimination: Goal: Will not experience complications related to bowel motility 08/22/2021 1534 by Fidelia Cathers Bet, LPN Outcome: Progressing 08/22/2021 1534 by Mammie Meras Bet, LPN Outcome: Progressing   Problem: Pain Managment: Goal: General experience of comfort will improve 08/22/2021 1534 by Carleen Rhue Bet, LPN Outcome: Progressing 08/22/2021 1534 by Avyn Aden Bet, LPN Outcome: Progressing   Problem: Safety: Goal: Ability to remain free from injury will improve 08/22/2021 1534 by Prince Olivier Bet, LPN Outcome: Progressing 08/22/2021 1534 by Briselda Naval Bet, LPN Outcome: Progressing   Problem: Skin Integrity: Goal: Risk for impaired skin integrity will decrease 08/22/2021 1534 by Felicia Bloomquist Bet, LPN Outcome: Progressing 08/22/2021 1534 by Farrah Skoda Bet, LPN Outcome: Progressing

## 2021-08-22 NOTE — Plan of Care (Signed)
  Problem: Education: Goal: Knowledge of General Education information will improve Description: Including pain rating scale, medication(s)/side effects and non-pharmacologic comfort measures Outcome: Not Progressing   Problem: Health Behavior/Discharge Planning: Goal: Ability to manage health-related needs will improve Outcome: Not Progressing   Problem: Clinical Measurements: Goal: Ability to maintain clinical measurements within normal limits will improve Outcome: Not Progressing   Problem: Clinical Measurements: Goal: Will remain free from infection Outcome: Not Progressing   Problem: Clinical Measurements: Goal: Diagnostic test results will improve Outcome: Not Progressing   Problem: Clinical Measurements: Goal: Respiratory complications will improve Outcome: Not Progressing   Problem: Clinical Measurements: Goal: Cardiovascular complication will be avoided Outcome: Not Progressing   Problem: Activity: Goal: Risk for activity intolerance will decrease Outcome: Not Progressing   Problem: Nutrition: Goal: Adequate nutrition will be maintained Outcome: Not Progressing   Problem: Elimination: Goal: Will not experience complications related to bowel motility Outcome: Not Progressing   Problem: Elimination: Goal: Will not experience complications related to urinary retention Outcome: Not Progressing   Problem: Pain Managment: Goal: General experience of comfort will improve Outcome: Not Progressing   Problem: Safety: Goal: Ability to remain free from injury will improve Outcome: Not Progressing   Problem: Skin Integrity: Goal: Risk for impaired skin integrity will decrease Outcome: Not Progressing

## 2021-08-22 NOTE — Progress Notes (Signed)
PROGRESS NOTE    Courtney Lynn  ZOX:[id number] DOB: 1940-10-21 DOA: 08/18/2021 PCP: Venia Carbon, MD  Coming from: [facility]    Brief Narrative:  From admission h and p Courtney Lynn is a 81 y.o. female with medical history significant of HTN, HLD, stroke, dementia, GERD, depression, OSA on CPAP, mitral valve regurgitation, breast cancer (s/p of right partial mastectomy and radiation therapy), obesity with BMI 34.99, anemia, vertigo, PVC, dementia, mild pulmonary hypertension, PAF not anticoagulants, who presents with fall.   Per report, pt had witnessed fall in facility this AM. No LOC. Pt fell while she was ambulating with a walker when she reached up to scratch herself and she fell from a standing position. She injured her head, left hip and left arm.  She obviously has pain in left hip and left arm, but cannot provide detailed information to characterize her pain.  Patient does not have active nausea, vomiting, diarrhea, respiratory distress cough noted.  Does not seem to have abdominal pain or chest pain.   Per her brother (I called her brother by phone), patient has history of dementia after stroke.  Normally patient is not orientated x3.     Assessment & Plan:   Principal Problem:   Fracture of femoral neck, left, closed (HCC) Active Problems:   Left wrist fracture   Fracture of radial neck, left, closed   Pubic ramus fracture, left, closed, initial encounter ([place])   Closed compression fracture of body of L1 vertebra ([place])   Fall   PAF (paroxysmal atrial fibrillation) (HCC)   Hypertension   HLD (hyperlipidemia)   Depression   Obesity with body mass index (BMI) of 30.0 to 39.9   Dementia (HCC)   OSA on CPAP   Essential hypertension   Iron deficiency anemia   Leukocytosis   Chronic kidney disease, stage 3a (HCC)   Stroke (HCC)   Fracture of femoral neck, left, closed ([place]) Pubic rami fractures Ortho following, now s/p left hip hemiarthroplasty 7/20. Hgb  stable - Pain control: tylenol (holding opioids given encephalopathy) - When necessary Zofran for nausea - bowel regimen - for SNF  Encephalopathy Dementia Has baseline advanced dementia, CVA. Mentation somewhat improved today with holding opioids. Mri 7/21 without acute findings. Baseline advanced dementia - opioids held for now - palliative consulted   Distal radius/ulna fracture, radial head fracture Ortho following. Initial treatment with sugar-tong splint with plan for ortho f/u and eventual short arm cast.   PAF (paroxysmal atrial fibrillation) (HCC) Heart rate 80s with coreg but hypotensive yesterday. Bp improved today, hr elevated - re-start coreg at lower dose of 3.125  Decreased urine output 300 documented last 24, cr up-trending slightly. Po remains poor - increase fluids to 125   Hypertension Bp wnl - coreg as above   HLD (hyperlipidemia) - Lipitor   Depression - Celexa   Obesity with body mass index (BMI) of 30.0 to 39.9   Stroke (HCC) Large stroke last year, has had significant decline since then - plavis, atorvastatin   Chronic kidney disease, stage 3a (HCC) Slight worsening of cr, will increase fluids   DVT prophylaxis: lovenox  Code Status: dnr Family Communication: brother (and power of attorney) updated telephonically 7/21  Level of care: Telemetry Medical Status is: Inpatient Remains inpatient appropriate because: severity of illness   Consultants:  ortho  Procedures: pending  Antimicrobials:  none    Subjective: Awake, confused, in mitts  Objective: Vitals:   08/21/21 2109 08/22/21 0000 08/22/21  0518 08/22/21 0824  BP: 103/71 108/87 119/82 120/81  Pulse: 99 88 86 92  Resp: '16 16 17 16  '$ Temp: 97.8 F (36.6 C) 98.3 F (36.8 C) 98.9 F (37.2 C) (!) 97.2 F (36.2 C)  TempSrc:      SpO2: 94% 92% 97% 95%  Weight:      Height:        Intake/Output Summary (Last 24 hours) at 08/22/2021 1327 Last data filed at 08/22/2021  3474 Gross per 24 hour  Intake 600 ml  Output 300 ml  Net 300 ml   Filed Weights   08/18/21 1039  Weight: 84 kg    Examination:  General exam: confused, awake Respiratory system: Clear to auscultation. Respiratory effort normal. Cardiovascular system: S1 & S2 heard, irreg irreg, soft systolic murmur Gastrointestinal system: Abdomen is nondistended, soft and nontender. No organomegaly or masses felt. Normal bowel sounds heard. Central nervous system: awake, moving all 4 Extremities: warm, splint left arm Skin: dressing c/d/i Psychiatry: confused    Data Reviewed: I have personally reviewed following labs and imaging studies  CBC: Recent Labs  Lab 08/18/21 1123 08/19/21 0527 08/20/21 0417 08/21/21 0441 08/22/21 0429  WBC 23.7* 14.8* 15.4* 17.0* 14.5*  NEUTROABS 20.4*  --   --   --   --   HGB 11.6* 11.0* 10.2* 9.5* 9.7*  HCT 35.4* 32.8* 31.1* 29.6* 30.0*  MCV 89.4 88.4 88.9 92.2 91.2  PLT 429* 351 305 278 259   Basic Metabolic Panel: Recent Labs  Lab 08/18/21 1123 08/19/21 0527 08/20/21 0417 08/21/21 0441 08/22/21 0429  NA 138 139 138 139 139  K 4.0 4.1 4.2 4.3 4.9  CL 107 110 109 109 107  CO2 22 21* '23 24 25  '$ GLUCOSE 172* 145* 124* 89 88  BUN 32* 31* 33* 42* 52*  CREATININE 1.14* 1.00 1.08* 1.22* 1.26*  CALCIUM 9.2 8.8* 8.6* 8.5* 8.6*   GFR: Estimated Creatinine Clearance: 33.9 mL/min (A) (by C-G formula based on SCr of 1.26 mg/dL (H)). Liver Function Tests: Recent Labs  Lab 08/18/21 1123  AST 32  ALT 28  ALKPHOS 115  BILITOT 0.7  PROT 6.5  ALBUMIN 3.4*   No results for input(s): "LIPASE", "AMYLASE" in the last 168 hours. No results for input(s): "AMMONIA" in the last 168 hours. Coagulation Profile: Recent Labs  Lab 08/18/21 1123  INR 1.1   Cardiac Enzymes: No results for input(s): "CKTOTAL", "CKMB", "CKMBINDEX", "TROPONINI" in the last 168 hours. BNP (last 3 results) No results for input(s): "PROBNP" in the last 8760 hours. HbA1C: No  results for input(s): "HGBA1C" in the last 72 hours. CBG: Recent Labs  Lab 08/18/21 1713 08/22/21 0802 08/22/21 1217  GLUCAP 130* 87 107*   Lipid Profile: No results for input(s): "CHOL", "HDL", "LDLCALC", "TRIG", "CHOLHDL", "LDLDIRECT" in the last 72 hours. Thyroid Function Tests: No results for input(s): "TSH", "T4TOTAL", "FREET4", "T3FREE", "THYROIDAB" in the last 72 hours. Anemia Panel: No results for input(s): "VITAMINB12", "FOLATE", "FERRITIN", "TIBC", "IRON", "RETICCTPCT" in the last 72 hours. Urine analysis:    Component Value Date/Time   COLORURINE YELLOW (A) 08/18/2021 1227   APPEARANCEUR HAZY (A) 08/18/2021 1227   LABSPEC 1.026 08/18/2021 1227   PHURINE 5.0 08/18/2021 1227   GLUCOSEU NEGATIVE 08/18/2021 1227   HGBUR NEGATIVE 08/18/2021 1227   BILIRUBINUR NEGATIVE 08/18/2021 1227   KETONESUR 5 (A) 08/18/2021 1227   PROTEINUR 30 (A) 08/18/2021 1227   NITRITE NEGATIVE 08/18/2021 1227   LEUKOCYTESUR NEGATIVE 08/18/2021 1227   Sepsis  Labs: '@LABRCNTIP'$ (procalcitonin:4,lacticidven:4)  )No results found for this or any previous visit (from the past 240 hour(s)).       Radiology Studies: MR BRAIN WO CONTRAST  Result Date: 08/21/2021 CLINICAL DATA:  Mental status change, persistent or worsening; encephalopathy. EXAM: MRI HEAD WITHOUT CONTRAST TECHNIQUE: Multiplanar, multiecho pulse sequences of the brain and surrounding structures were obtained without intravenous contrast. COMPARISON:  Head CT August 18, 2021. FINDINGS: Brain: No acute infarction, hemorrhage, hydrocephalus, extra-axial collection or mass lesion. Areas of encephalomalacia and gliosis in the left temporoparietal region and left cerebellar hemisphere. Scattered foci of T2 hyperintensity are seen within the white matter of the cerebral hemispheres are nonspecific, most likely related to chronic small vessel ischemia. Parenchymal volume loss, advanced in the bilateral temporal lobes. Vascular: Normal flow voids.  Skull and upper cervical spine: Normal marrow signal. Sinuses/Orbits: Mucous retention cyst in the left maxillary sinuses. Bilateral surgery. Other: None. IMPRESSION: 1. No acute intracranial abnormality. 2. Areas of encephalomalacia and gliosis in the left cerebellar hemisphere and left temporoparietal region. 3. Parenchymal volume loss, severe in the bilateral temporal lobes. Electronically Signed   By: Pedro Earls M.D.   On: 08/21/2021 15:09        Scheduled Meds:  atorvastatin  80 mg Oral Daily   citalopram  10 mg Oral QODAY   clopidogrel  75 mg Oral Daily   docusate sodium  100 mg Oral BID   donepezil  10 mg Oral QHS   enoxaparin (LOVENOX) injection  40 mg Subcutaneous Q24H   famotidine  20 mg Oral Daily   feeding supplement  237 mL Oral BID BM   lactase  1 tablet Oral Daily   lidocaine  1 patch Transdermal Q24H   multivitamin with minerals  1 tablet Oral BID   polyethylene glycol  17 g Oral Daily   senna  1 tablet Oral QHS   Continuous Infusions:  dextrose 5 % and 0.45% NaCl       LOS: 4 days     Desma Maxim, MD [facility]   If 7PM-7AM, please contact night-coverage [email or web address] Password [email or web address] 08/22/2021, 1:27 PM

## 2021-08-22 NOTE — Plan of Care (Signed)
  Problem: Education: Goal: Knowledge of General Education information will improve Description: Including pain rating scale, medication(s)/side effects and non-pharmacologic comfort measures Outcome: Not Progressing   Problem: Health Behavior/Discharge Planning: Goal: Ability to manage health-related needs will improve Outcome: Not Progressing   Problem: Clinical Measurements: Goal: Ability to maintain clinical measurements within normal limits will improve Outcome: Progressing   Problem: Clinical Measurements: Goal: Will remain free from infection Outcome: Progressing   Problem: Clinical Measurements: Goal: Diagnostic test results will improve Outcome: Progressing   Problem: Clinical Measurements: Goal: Respiratory complications will improve Outcome: Progressing   Problem: Activity: Goal: Risk for activity intolerance will decrease Outcome: Not Progressing   Problem: Nutrition: Goal: Adequate nutrition will be maintained Outcome: Adequate for Discharge   Problem: Coping: Goal: Level of anxiety will decrease Outcome: Not Progressing   Problem: Elimination: Goal: Will not experience complications related to bowel motility Outcome: Adequate for Discharge   Problem: Elimination: Goal: Will not experience complications related to urinary retention Outcome: Progressing   Problem: Pain Managment: Goal: General experience of comfort will improve Outcome: Adequate for Discharge   Problem: Safety: Goal: Ability to remain free from injury will improve Outcome: Not Progressing   Problem: Skin Integrity: Goal: Risk for impaired skin integrity will decrease Outcome: Adequate for Discharge

## 2021-08-22 NOTE — Progress Notes (Signed)
Patient refused CPAP for bedtime. Patient instructed to notify RN if she changes her mind.

## 2021-08-23 DIAGNOSIS — S72002A Fracture of unspecified part of neck of left femur, initial encounter for closed fracture: Secondary | ICD-10-CM | POA: Diagnosis not present

## 2021-08-23 LAB — BASIC METABOLIC PANEL
Anion gap: 5 (ref 5–15)
BUN: 42 mg/dL — ABNORMAL HIGH (ref 8–23)
CO2: 22 mmol/L (ref 22–32)
Calcium: 8.1 mg/dL — ABNORMAL LOW (ref 8.9–10.3)
Chloride: 107 mmol/L (ref 98–111)
Creatinine, Ser: 0.91 mg/dL (ref 0.44–1.00)
GFR, Estimated: >60 mL/min (ref >60)
Glucose, Bld: 125 mg/dL — ABNORMAL HIGH (ref 70–99)
Potassium: 4 mmol/L (ref 3.5–5.1)
Sodium: 134 mmol/L — ABNORMAL LOW (ref 135–145)

## 2021-08-23 LAB — GLUCOSE, CAPILLARY: Glucose-Capillary: 116 mg/dL — ABNORMAL HIGH (ref 70–99)

## 2021-08-23 MED ORDER — CARVEDILOL 3.125 MG PO TABS
3.1250 mg | ORAL_TABLET | Freq: Once | ORAL | Status: AC
Start: 2021-08-23 — End: 2021-08-23
  Administered 2021-08-23: 3.125 mg via ORAL
  Filled 2021-08-23: qty 1

## 2021-08-23 MED ORDER — METOPROLOL TARTRATE 5 MG/5ML IV SOLN
5.0000 mg | Freq: Once | INTRAVENOUS | Status: AC
  Administered 2021-08-23: 5 mg via INTRAVENOUS
  Filled 2021-08-23: qty 5

## 2021-08-23 MED ORDER — CARVEDILOL 3.125 MG PO TABS: 6.2500 mg | ORAL_TABLET | Freq: Two times a day (BID) | ORAL | Status: DC

## 2021-08-23 MED ORDER — ENOXAPARIN SODIUM 40 MG/0.4ML IJ SOSY: 40.0000 mg | PREFILLED_SYRINGE | INTRAMUSCULAR | Status: DC

## 2021-08-23 MED ORDER — CARVEDILOL 6.25 MG PO TABS: 6.2500 mg | ORAL_TABLET | Freq: Two times a day (BID) | ORAL | Status: AC

## 2021-08-23 NOTE — Progress Notes (Addendum)
PROGRESS NOTE    Courtney Lynn  FUX:[id number] DOB: 1940-11-28 DOA: 08/18/2021 PCP: Venia Carbon, MD  Coming from: [facility]    Brief Narrative:  From admission h and p Courtney Lynn is a 81 y.o. female with medical history significant of HTN, HLD, stroke, dementia, GERD, depression, OSA on CPAP, mitral valve regurgitation, breast cancer (s/p of right partial mastectomy and radiation therapy), obesity with BMI 34.99, anemia, vertigo, PVC, dementia, mild pulmonary hypertension, PAF not anticoagulants, who presents with fall.   Per report, pt had witnessed fall in facility this AM. No LOC. Pt fell while she was ambulating with a walker when she reached up to scratch herself and she fell from a standing position. She injured her head, left hip and left arm.  She obviously has pain in left hip and left arm, but cannot provide detailed information to characterize her pain.  Patient does not have active nausea, vomiting, diarrhea, respiratory distress cough noted.  Does not seem to have abdominal pain or chest pain.   Per her brother (I called her brother by phone), patient has history of dementia after stroke.  Normally patient is not orientated x3.     Assessment & Plan:   Principal Problem:   Fracture of femoral neck, left, closed (HCC) Active Problems:   Left wrist fracture   Fracture of radial neck, left, closed   Pubic ramus fracture, left, closed, initial encounter ([place])   Closed compression fracture of body of L1 vertebra ([place])   Fall   PAF (paroxysmal atrial fibrillation) (HCC)   Hypertension   HLD (hyperlipidemia)   Depression   Obesity with body mass index (BMI) of 30.0 to 39.9   Dementia (HCC)   OSA on CPAP   Essential hypertension   Iron deficiency anemia   Leukocytosis   Chronic kidney disease, stage 3a (HCC)   Stroke (HCC)   Fracture of femoral neck, left, closed ([place]) Pubic rami fractures Ortho following, now s/p left hip hemiarthroplasty 7/20. Hgb  stable - Pain control: tylenol (holding opioids given encephalopathy, no apparent pain) - When necessary Zofran for nausea - bowel regimen - for SNF  Encephalopathy Dementia Has baseline advanced dementia, CVA. Mentation somewhat improved today with holding opioids. Mri 7/21 without acute findings. Baseline advanced dementia - opioids held for now as above - palliative consulted   Distal radius/ulna fracture, radial head fracture Ortho following. Initial treatment with sugar-tong splint with plan for ortho f/u and eventual short arm cast.   PAF (paroxysmal atrial fibrillation) (HCC) Hr elevated today 130 - increase coreg to 6.25 bid  Hypertension Bp wnl - coreg as above   HLD (hyperlipidemia) - Lipitor   Depression - Celexa   Obesity with body mass index (BMI) of 30.0 to 39.9   Stroke (HCC) Large stroke last year, has had significant decline since then - plavis, atorvastatin   Chronic kidney disease, stage 3a (HCC) Stable   DVT prophylaxis: lovenox  Code Status: dnr Family Communication: brother (and power of attorney) updated telephonically 7/23  Level of care: Telemetry Medical Status is: Inpatient Remains inpatient appropriate because: awaiting approval to return to long-term care   Consultants:  ortho  Procedures: pending  Antimicrobials:  none    Subjective: Awake, confused, no complaints  Objective: Vitals:   08/22/21 1633 08/22/21 2058 08/23/21 0549 08/23/21 0813  BP: 107/82 124/87 110/82 (!) 138/99  Pulse: 73 83 78 66  Resp: '16 20 18 16  '$ Temp: 98.2 F (36.8 C)  98.4 F (36.9 C) 98.2 F (36.8 C) 98.7 F (37.1 C)  TempSrc:      SpO2: 94% 94% 97% 97%  Weight:      Height:        Intake/Output Summary (Last 24 hours) at 08/23/2021 1323 Last data filed at 08/23/2021 1028 Gross per 24 hour  Intake 1365.28 ml  Output 600 ml  Net 765.28 ml   Filed Weights   08/18/21 1039  Weight: 84 kg    Examination:  General exam: confused,  awake Respiratory system: Clear to auscultation. Respiratory effort normal. Cardiovascular system: S1 & S2 heard, irreg irreg, soft systolic murmur Gastrointestinal system: Abdomen is nondistended, soft and nontender. No organomegaly or masses felt. Normal bowel sounds heard. Central nervous system: awake, moving all 4 Extremities: warm, splint left arm Skin: dressing c/d/i Psychiatry: confused    Data Reviewed: I have personally reviewed following labs and imaging studies  CBC: Recent Labs  Lab 08/18/21 1123 08/19/21 0527 08/20/21 0417 08/21/21 0441 08/22/21 0429  WBC 23.7* 14.8* 15.4* 17.0* 14.5*  NEUTROABS 20.4*  --   --   --   --   HGB 11.6* 11.0* 10.2* 9.5* 9.7*  HCT 35.4* 32.8* 31.1* 29.6* 30.0*  MCV 89.4 88.4 88.9 92.2 91.2  PLT 429* 351 305 278 973   Basic Metabolic Panel: Recent Labs  Lab 08/19/21 0527 08/20/21 0417 08/21/21 0441 08/22/21 0429 08/23/21 0418  NA 139 138 139 139 134*  K 4.1 4.2 4.3 4.9 4.0  CL 110 109 109 107 107  CO2 21* '23 24 25 22  '$ GLUCOSE 145* 124* 89 88 125*  BUN 31* 33* 42* 52* 42*  CREATININE 1.00 1.08* 1.22* 1.26* 0.91  CALCIUM 8.8* 8.6* 8.5* 8.6* 8.1*   GFR: Estimated Creatinine Clearance: 46.9 mL/min (by C-G formula based on SCr of 0.91 mg/dL). Liver Function Tests: Recent Labs  Lab 08/18/21 1123  AST 32  ALT 28  ALKPHOS 115  BILITOT 0.7  PROT 6.5  ALBUMIN 3.4*   No results for input(s): "LIPASE", "AMYLASE" in the last 168 hours. No results for input(s): "AMMONIA" in the last 168 hours. Coagulation Profile: Recent Labs  Lab 08/18/21 1123  INR 1.1   Cardiac Enzymes: No results for input(s): "CKTOTAL", "CKMB", "CKMBINDEX", "TROPONINI" in the last 168 hours. BNP (last 3 results) No results for input(s): "PROBNP" in the last 8760 hours. HbA1C: No results for input(s): "HGBA1C" in the last 72 hours. CBG: Recent Labs  Lab 08/18/21 1713 08/22/21 0802 08/22/21 1217 08/23/21 1145  GLUCAP 130* 87 107* 116*    Lipid Profile: No results for input(s): "CHOL", "HDL", "LDLCALC", "TRIG", "CHOLHDL", "LDLDIRECT" in the last 72 hours. Thyroid Function Tests: No results for input(s): "TSH", "T4TOTAL", "FREET4", "T3FREE", "THYROIDAB" in the last 72 hours. Anemia Panel: No results for input(s): "VITAMINB12", "FOLATE", "FERRITIN", "TIBC", "IRON", "RETICCTPCT" in the last 72 hours. Urine analysis:    Component Value Date/Time   COLORURINE YELLOW (A) 08/18/2021 1227   APPEARANCEUR HAZY (A) 08/18/2021 1227   LABSPEC 1.026 08/18/2021 1227   PHURINE 5.0 08/18/2021 1227   GLUCOSEU NEGATIVE 08/18/2021 1227   HGBUR NEGATIVE 08/18/2021 1227   BILIRUBINUR NEGATIVE 08/18/2021 1227   KETONESUR 5 (A) 08/18/2021 1227   PROTEINUR 30 (A) 08/18/2021 1227   NITRITE NEGATIVE 08/18/2021 1227   LEUKOCYTESUR NEGATIVE 08/18/2021 1227   Sepsis Labs: '@LABRCNTIP'$ (procalcitonin:4,lacticidven:4)  )No results found for this or any previous visit (from the past 240 hour(s)).       Radiology Studies: MR BRAIN  WO CONTRAST  Result Date: 08/21/2021 CLINICAL DATA:  Mental status change, persistent or worsening; encephalopathy. EXAM: MRI HEAD WITHOUT CONTRAST TECHNIQUE: Multiplanar, multiecho pulse sequences of the brain and surrounding structures were obtained without intravenous contrast. COMPARISON:  Head CT August 18, 2021. FINDINGS: Brain: No acute infarction, hemorrhage, hydrocephalus, extra-axial collection or mass lesion. Areas of encephalomalacia and gliosis in the left temporoparietal region and left cerebellar hemisphere. Scattered foci of T2 hyperintensity are seen within the white matter of the cerebral hemispheres are nonspecific, most likely related to chronic small vessel ischemia. Parenchymal volume loss, advanced in the bilateral temporal lobes. Vascular: Normal flow voids. Skull and upper cervical spine: Normal marrow signal. Sinuses/Orbits: Mucous retention cyst in the left maxillary sinuses. Bilateral surgery.  Other: None. IMPRESSION: 1. No acute intracranial abnormality. 2. Areas of encephalomalacia and gliosis in the left cerebellar hemisphere and left temporoparietal region. 3. Parenchymal volume loss, severe in the bilateral temporal lobes. Electronically Signed   By: Pedro Earls M.D.   On: 08/21/2021 15:09        Scheduled Meds:  atorvastatin  80 mg Oral Daily   carvedilol  3.125 mg Oral Once   carvedilol  6.25 mg Oral BID WC   citalopram  10 mg Oral QODAY   clopidogrel  75 mg Oral Daily   docusate sodium  100 mg Oral BID   donepezil  10 mg Oral QHS   enoxaparin (LOVENOX) injection  40 mg Subcutaneous Q24H   famotidine  20 mg Oral Daily   feeding supplement  237 mL Oral BID BM   lactase  1 tablet Oral Daily   lidocaine  1 patch Transdermal Q24H   multivitamin with minerals  1 tablet Oral BID   polyethylene glycol  17 g Oral Daily   senna  1 tablet Oral QHS   Continuous Infusions:     LOS: 5 days     Desma Maxim, MD [facility]   If 7PM-7AM, please contact night-coverage [email or web address] Password [email or web address] 08/23/2021, 1:23 PM

## 2021-08-23 NOTE — Discharge Summary (Signed)
Courtney Lynn UUV:[id number] DOB: 1940-04-09 DOA: 08/18/2021  PCP: Venia Carbon, MD  Admit date: 08/18/2021 Discharge date: 08/23/2021  Time spent: 35 minutes  Recommendations for Outpatient Follow-up:  Orthopedics f/u 1-2 weeks Palliative care referral placed     Discharge Diagnoses:  Principal Problem:   Fracture of femoral neck, left, closed ([place]) Active Problems:   Left wrist fracture   Fracture of radial neck, left, closed   Pubic ramus fracture, left, closed, initial encounter ([place])   Closed compression fracture of body of L1 vertebra ([place])   Fall   PAF (paroxysmal atrial fibrillation) (HCC)   Hypertension   HLD (hyperlipidemia)   Depression   Obesity with body mass index (BMI) of 30.0 to 39.9   Dementia (HCC)   OSA on CPAP   Essential hypertension   Iron deficiency anemia   Leukocytosis   Chronic kidney disease, stage 3a ([place])   Stroke [facility])   Discharge Condition: stable  Diet recommendation: dysphagia 1 diet  Filed Weights   08/18/21 1039  Weight: 84 kg    History of present illness:  From admission h and p Courtney Lynn is a 81 y.o. female with medical history significant of HTN, HLD, stroke, dementia, GERD, depression, OSA on CPAP, mitral valve regurgitation, breast cancer (s/p of right partial mastectomy and radiation therapy), obesity with BMI 34.99, anemia, vertigo, PVC, dementia, mild pulmonary hypertension, PAF not anticoagulants, who presents with fall.   Per report, pt had witnessed fall in facility this AM. No LOC. Pt fell while she was ambulating with a walker when she reached up to scratch herself and she fell from a standing position. She injured her head, left hip and left arm.  She obviously has pain in left hip and left arm, but cannot provide detailed information to characterize her pain.  Patient does not have active nausea, vomiting, diarrhea, respiratory distress cough noted.  Does not seem to have abdominal pain or chest  pain.   Hospital Course:  Fracture of femoral neck, left, closed ([place]) Pubic rami fractures Distal radius/ulna fracture, radial head fracture Ortho following, now s/p left hip hemiarthroplasty 7/20. Hgb stable. sugar-tong splint to left arm with plan for ortho f/u and eventual short arm cast.  - Pain controlled - bowel regimen - lovenox daily until ortho f/u - weightbearing as tolerated both lower extremities - posterior hip precautions for the left hip 6 months  Nonweightbearing left wrist and forearm but may use platform walker   Encephalopathy Dementia Has baseline advanced dementia, CVA. Mentation somewhat improved today with holding opioids. Mri 7/21 without acute findings. Baseline advanced dementia - outpt palliative referral    PAF (paroxysmal atrial fibrillation) (HCC) Hr elevated here, coreg started. Not anticoagulated at baseline, is followed by cardiology   Hypertension Bp wnl - coreg as above, home meds held   HLD (hyperlipidemia) - Lipitor   Depression - Celexa   Obesity with body mass index (BMI) of 30.0 to 39.9   Stroke ([place]) Large stroke last year, has had significant decline since then - plavis, atorvastatin   Chronic kidney disease, stage 3a ([place]) Stable  Procedures: See above   Consultations: orthopedics  Discharge Exam: Vitals:   08/23/21 0549 08/23/21 0813  BP: 110/82 (!) 138/99  Pulse: 78 66  Resp: 18 16  Temp: 98.2 F (36.8 C) 98.7 F (37.1 C)  SpO2: 97% 97%    General exam: confused, awake Respiratory system: Clear to auscultation. Respiratory effort normal. Cardiovascular system: S1 & S2  heard, irreg irreg, soft systolic murmur Gastrointestinal system: Abdomen is nondistended, soft and nontender. No organomegaly or masses felt. Normal bowel sounds heard. Central nervous system: awake, moving all 4 Extremities: warm, splint left arm Skin: dressing c/d/i Psychiatry: confused  Discharge Instructions   Discharge  Instructions     Diet - low sodium heart healthy   Complete by: As directed    Increase activity slowly   Complete by: As directed    Leave dressing on - Keep it clean, dry, and intact until clinic visit   Complete by: As directed       Allergies as of 08/23/2021       Reactions   Cleocin [clindamycin Hcl] Swelling, Rash, Other (See Comments)   Tongue, lip swelling Tongue, lip swelling Tongue, lip swelling   Pen Vk [penicillin V] Swelling, Rash, Other (See Comments)   Tongue, lip swelling Funny feeling on the tongue Has patient had a PCN reaction causing immediate rash, facial/tongue/throat swelling, SOB or lightheadedness with hypotension:Yes Has patient had a PCN reaction causing severe rash involving mucus membranes or skin necrosis:No Has patient had a PCN reaction that required hospitalization:No Has patient had a PCN reaction occurring within the last 10 years:No If all of the above answers are "NO", then may proceed with Cephalosporin use.        Medication List     STOP taking these medications    docusate sodium 100 MG capsule Commonly known as: COLACE   felodipine 5 MG 24 hr tablet Commonly known as: PLENDIL   hydrALAZINE 25 MG tablet Commonly known as: APRESOLINE   hydrALAZINE 50 MG tablet Commonly known as: APRESOLINE   olmesartan 40 MG tablet Commonly known as: BENICAR   omeprazole 20 MG capsule Commonly known as: PRILOSEC   OVER THE COUNTER MEDICATION   OVER THE COUNTER MEDICATION   OVER THE COUNTER MEDICATION   OVER THE COUNTER MEDICATION   OVER THE COUNTER MEDICATION   OVER THE COUNTER MEDICATION       TAKE these medications    acetaminophen 500 MG tablet Commonly known as: TYLENOL Take 1,000 mg by mouth at bedtime.   atorvastatin 80 MG tablet Commonly known as: LIPITOR Take 1 tablet (80 mg total) by mouth daily.   Benefiber Chew Chew 1 tablet by mouth daily.   carvedilol 6.25 MG tablet Commonly known as: COREG Take 1  tablet (6.25 mg total) by mouth 2 (two) times daily with a meal.   citalopram 10 MG tablet Commonly known as: CELEXA Take 10 mg by mouth every other day.   clopidogrel 75 MG tablet Commonly known as: PLAVIX Take 1 tablet (75 mg total) by mouth daily.   donepezil 10 MG tablet Commonly known as: ARICEPT Take 10 mg by mouth at bedtime.   enoxaparin 40 MG/0.4ML injection Commonly known as: LOVENOX Inject 0.4 mLs (40 mg total) into the skin daily. Start taking on: August 24, 2021   famotidine 20 MG tablet Commonly known as: PEPCID Take 1 tablet by mouth daily.   gabapentin 100 MG capsule Commonly known as: NEURONTIN Take 200 mg by mouth at bedtime.   iron polysaccharides 150 MG capsule Commonly known as: NIFEREX Take 150 mg by mouth daily.   lactase 3000 units tablet Commonly known as: LACTAID Take 1 tablet by mouth daily.   methocarbamol 500 MG tablet Commonly known as: ROBAXIN Take 500 mg by mouth at bedtime.   multivitamin tablet Take 1 tablet by mouth 2 (two) times daily.  polyethylene glycol 17 g packet Commonly known as: MIRALAX / GLYCOLAX Take 17 g by mouth daily.               Discharge Care Instructions  (From admission, onward)           Start     Ordered   08/23/21 0000  Leave dressing on - Keep it clean, dry, and intact until clinic visit        08/23/21 1508           Allergies  Allergen Reactions   Cleocin [Clindamycin Hcl] Swelling, Rash and Other (See Comments)    Tongue, lip swelling Tongue, lip swelling Tongue, lip swelling   Pen Vk [Penicillin V] Swelling, Rash and Other (See Comments)    Tongue, lip swelling Funny feeling on the tongue  Has patient had a PCN reaction causing immediate rash, facial/tongue/throat swelling, SOB or lightheadedness with hypotension:Yes Has patient had a PCN reaction causing severe rash involving mucus membranes or skin necrosis:No Has patient had a PCN reaction that required  hospitalization:No Has patient had a PCN reaction occurring within the last 10 years:No If all of the above answers are "NO", then may proceed with Cephalosporin use.     Follow-up Information     Thornton Park, MD Follow up.   Specialty: Orthopedic Surgery Why: 1-2 weeks Contact information: [place] [phone] [phone]                  The results of significant diagnostics from this hospitalization (including imaging, microbiology, ancillary and laboratory) are listed below for reference.    Significant Diagnostic Studies: MR BRAIN WO CONTRAST  Result Date: 08/21/2021 CLINICAL DATA:  Mental status change, persistent or worsening; encephalopathy. EXAM: MRI HEAD WITHOUT CONTRAST TECHNIQUE: Multiplanar, multiecho pulse sequences of the brain and surrounding structures were obtained without intravenous contrast. COMPARISON:  Head CT August 18, 2021. FINDINGS: Brain: No acute infarction, hemorrhage, hydrocephalus, extra-axial collection or mass lesion. Areas of encephalomalacia and gliosis in the left temporoparietal region and left cerebellar hemisphere. Scattered foci of T2 hyperintensity are seen within the white matter of the cerebral hemispheres are nonspecific, most likely related to chronic small vessel ischemia. Parenchymal volume loss, advanced in the bilateral temporal lobes. Vascular: Normal flow voids. Skull and upper cervical spine: Normal marrow signal. Sinuses/Orbits: Mucous retention cyst in the left maxillary sinuses. Bilateral surgery. Other: None. IMPRESSION: 1. No acute intracranial abnormality. 2. Areas of encephalomalacia and gliosis in the left cerebellar hemisphere and left temporoparietal region. 3. Parenchymal volume loss, severe in the bilateral temporal lobes. Electronically Signed   By: Pedro Earls M.D.   On: 08/21/2021 15:09   DG Hip Port Unilat With Pelvis 1V Left  Result Date: 08/20/2021 CLINICAL DATA:   Status post left hip replacement EXAM: DG HIP (WITH OR WITHOUT PELVIS) 1V PORT LEFT COMPARISON:  08/18/2021 FINDINGS: Status post interval left total hip arthroplasty. No periprosthetic lucency or fracture. Near anatomic alignment of the prosthetic components. Air within the soft tissues is not unexpected postoperatively. Superficial skin staples. IMPRESSION: Expected postoperative appearance status post left total hip arthroplasty. Electronically Signed   By: Merilyn Baba M.D.   On: 08/20/2021 11:12   DG Chest Portable 1 View  Result Date: 08/18/2021 CLINICAL DATA:  Fall EXAM: PORTABLE CHEST 1 VIEW COMPARISON:  07/13/2020 FINDINGS: Cardiomegaly. Both lungs are clear. The visualized skeletal structures are unremarkable. IMPRESSION: Cardiomegaly without acute abnormality of the lungs in AP portable  projection. Electronically Signed   By: Delanna Ahmadi M.D.   On: 08/18/2021 12:19   DG Wrist Complete Left  Result Date: 08/18/2021 CLINICAL DATA:  Fall, pain EXAM: LEFT WRIST - COMPLETE 3+ VIEW COMPARISON:  None Available. FINDINGS: Osteopenia. Impacted fractures of the distal left radius and ulna which appear to be intra-articular in the radius. Minimal dorsal angulation. The carpus proper is normally aligned. Diffuse soft tissue edema about the wrist. IMPRESSION: 1. Impacted fractures of the distal left radius and ulna, which appear to be intra-articular in the radius. Minimal dorsal angulation. 2.  The carpus proper is normally aligned. 3.  Diffuse soft tissue edema about the wrist. Electronically Signed   By: Delanna Ahmadi M.D.   On: 08/18/2021 12:18   DG Elbow Complete Left  Result Date: 08/18/2021 CLINICAL DATA:  Fall, pain EXAM: LEFT ELBOW - COMPLETE 3+ VIEW COMPARISON:  None Available. FINDINGS: Suspect a subtle impacted fracture of the left radial neck. Associated elbow joint effusion. Moderate elbow joint arthrosis. Soft tissues unremarkable. IMPRESSION: 1. Suspect a subtle impacted fracture of the  left radial neck. Associated elbow joint effusion. 2. Moderate elbow joint arthrosis. Electronically Signed   By: Delanna Ahmadi M.D.   On: 08/18/2021 12:17   DG Shoulder Left  Result Date: 08/18/2021 CLINICAL DATA:  Fall, pain EXAM: LEFT SHOULDER - 2+ VIEW COMPARISON:  None Available. FINDINGS: No fracture or dislocation of the left shoulder. Mild glenohumeral and acromioclavicular arthrosis. Partially imaged left chest is unremarkable. IMPRESSION: No fracture or dislocation of the left shoulder. Mild glenohumeral and acromioclavicular arthrosis. Electronically Signed   By: Delanna Ahmadi M.D.   On: 08/18/2021 12:16   DG Knee Complete 4 Views Left  Result Date: 08/18/2021 CLINICAL DATA:  Fall, knee pain EXAM: LEFT KNEE - COMPLETE 4+ VIEW COMPARISON:  None Available. FINDINGS: No fracture or dislocation of the left knee. Mild tricompartmental joint space narrowing and osteophytosis, worst in the patellofemoral compartment. No knee joint effusion. Vascular calcinosis. IMPRESSION: No fracture or dislocation of the left knee. Mild tricompartmental osteoarthritis, worst in the patellofemoral compartment. Electronically Signed   By: Delanna Ahmadi M.D.   On: 08/18/2021 12:14   DG Hip Unilat W or Wo Pelvis 2-3 Views Left  Result Date: 08/18/2021 CLINICAL DATA:  Fall, pain EXAM: DG HIP (WITH OR WITHOUT PELVIS) 2-3V LEFT COMPARISON:  None Available. FINDINGS: Foreshortened, angulated basicervical fracture of the left femoral neck. Additional minimally displaced fractures of the left superior and inferior pubic rami. No displaced fracture or dislocation of the right hemipelvis or proximal right femur seen in single frontal view. Calcified uterine fibroids. Nonobstructive pattern of overlying bowel gas. IMPRESSION: 1. Foreshortened, angulated basicervical fracture of the left femoral neck. 2. Additional minimally displaced fractures of the left superior and inferior pubic rami. 3. No displaced fracture or dislocation  of the right hemipelvis or proximal right femur seen in single frontal view. Electronically Signed   By: Delanna Ahmadi M.D.   On: 08/18/2021 12:11   DG Lumbar Spine 2-3 Views  Result Date: 08/18/2021 CLINICAL DATA:  Fall, pain EXAM: LUMBAR SPINE - 2-3 VIEW COMPARISON:  MR lumbar spine, 09/01/2019 FINDINGS: Osteopenia. New wedge deformity of the L1 vertebral body with approximately 30% anterior height loss. This appears sclerotic and nonacute although is new compared to most recent imaging of the lumbar spine dated 09/01/2019. No other fracture or dislocation of the lumbar spine. Mild to moderate multilevel disc space height loss and osteophytosis throughout, worst at L4-L5. Nonobstructive  pattern of overlying bowel gas. Calcified uterine fibroids. IMPRESSION: 1. New wedge deformity of the L1 vertebral body with approximately 30% anterior height loss. This appears sclerotic and nonacute although is new compared to most recent imaging of the lumbar spine dated 09/01/2019. Correlate with acute point tenderness. MRI may be used to assess for marrow edema and fracture acuity if desired. 2.  Disc degenerative disease of the lumbar spine. Electronically Signed   By: Delanna Ahmadi M.D.   On: 08/18/2021 12:08   DG Thoracic Spine 2 View  Result Date: 08/18/2021 CLINICAL DATA:  Fall, pain EXAM: THORACIC SPINE 2 VIEWS COMPARISON:  None Available. FINDINGS: Osteopenia. No fracture or dislocation of the thoracic spine. Mild multilevel disc space height loss and osteophytosis throughout. Cardiomegaly. No acute abnormality of the included chest. IMPRESSION: 1. No fracture or dislocation of the thoracic spine. Mild multilevel thoracic disc degenerative disease. 2. Osteopenia. Electronically Signed   By: Delanna Ahmadi M.D.   On: 08/18/2021 12:03   CT HEAD WO CONTRAST (5MM)  Result Date: 08/18/2021 CLINICAL DATA:  Head trauma, minor (Age >= 65y); Neck trauma (Age >= 65y) EXAM: CT HEAD WITHOUT CONTRAST CT CERVICAL SPINE  WITHOUT CONTRAST TECHNIQUE: Multidetector CT imaging of the head and cervical spine was performed following the standard protocol without intravenous contrast. Multiplanar CT image reconstructions of the cervical spine were also generated. RADIATION DOSE REDUCTION: This exam was performed according to the departmental dose-optimization program which includes automated exposure control, adjustment of the mA and/or kV according to patient size and/or use of iterative reconstruction technique. COMPARISON:  CTA Head 09/25/2020. FINDINGS: CT HEAD FINDINGS Brain: No evidence of acute infarction, hemorrhage, hydrocephalus, extra-axial collection or mass lesion/mass effect. Remote infarct in the left temporoparietal region. Cerebral atrophy. Vascular: No hyperdense vessel identified. Skull: No acute fracture. Sinuses/Orbits: Left maxillary sinus retention cyst. Otherwise, clear sinuses. No acute orbital findings. Other: No mastoid effusions. CT CERVICAL SPINE FINDINGS Alignment: Normal. Skull base and vertebrae: Similar mild height loss of the C7 vertebral body. No evidence of acute fracture. Soft tissues and spinal canal: No prevertebral fluid or swelling. No visible canal hematoma. Disc levels:  Similar multilevel degenerative change. Upper chest: Visualized lung apices are clear. IMPRESSION: 1. No evidence of acute intracranial abnormality. 2. No evidence of acute fracture or traumatic malalignment the cervical spine. 3. Remote left temporoparietal infarct. Electronically Signed   By: Margaretha Sheffield M.D.   On: 08/18/2021 11:10   CT Cervical Spine Wo Contrast  Result Date: 08/18/2021 CLINICAL DATA:  Head trauma, minor (Age >= 65y); Neck trauma (Age >= 65y) EXAM: CT HEAD WITHOUT CONTRAST CT CERVICAL SPINE WITHOUT CONTRAST TECHNIQUE: Multidetector CT imaging of the head and cervical spine was performed following the standard protocol without intravenous contrast. Multiplanar CT image reconstructions of the cervical  spine were also generated. RADIATION DOSE REDUCTION: This exam was performed according to the departmental dose-optimization program which includes automated exposure control, adjustment of the mA and/or kV according to patient size and/or use of iterative reconstruction technique. COMPARISON:  CTA Head 09/25/2020. FINDINGS: CT HEAD FINDINGS Brain: No evidence of acute infarction, hemorrhage, hydrocephalus, extra-axial collection or mass lesion/mass effect. Remote infarct in the left temporoparietal region. Cerebral atrophy. Vascular: No hyperdense vessel identified. Skull: No acute fracture. Sinuses/Orbits: Left maxillary sinus retention cyst. Otherwise, clear sinuses. No acute orbital findings. Other: No mastoid effusions. CT CERVICAL SPINE FINDINGS Alignment: Normal. Skull base and vertebrae: Similar mild height loss of the C7 vertebral body. No evidence of acute fracture.  Soft tissues and spinal canal: No prevertebral fluid or swelling. No visible canal hematoma. Disc levels:  Similar multilevel degenerative change. Upper chest: Visualized lung apices are clear. IMPRESSION: 1. No evidence of acute intracranial abnormality. 2. No evidence of acute fracture or traumatic malalignment the cervical spine. 3. Remote left temporoparietal infarct. Electronically Signed   By: Margaretha Sheffield M.D.   On: 08/18/2021 11:10    Microbiology: No results found for this or any previous visit (from the past 240 hour(s)).   Labs: Basic Metabolic Panel: Recent Labs  Lab 08/19/21 0527 08/20/21 0417 08/21/21 0441 08/22/21 0429 08/23/21 0418  NA 139 138 139 139 134*  K 4.1 4.2 4.3 4.9 4.0  CL 110 109 109 107 107  CO2 21* '23 24 25 22  '$ GLUCOSE 145* 124* 89 88 125*  BUN 31* 33* 42* 52* 42*  CREATININE 1.00 1.08* 1.22* 1.26* 0.91  CALCIUM 8.8* 8.6* 8.5* 8.6* 8.1*   Liver Function Tests: Recent Labs  Lab 08/18/21 1123  AST 32  ALT 28  ALKPHOS 115  BILITOT 0.7  PROT 6.5  ALBUMIN 3.4*   No results for  input(s): "LIPASE", "AMYLASE" in the last 168 hours. No results for input(s): "AMMONIA" in the last 168 hours. CBC: Recent Labs  Lab 08/18/21 1123 08/19/21 0527 08/20/21 0417 08/21/21 0441 08/22/21 0429  WBC 23.7* 14.8* 15.4* 17.0* 14.5*  NEUTROABS 20.4*  --   --   --   --   HGB 11.6* 11.0* 10.2* 9.5* 9.7*  HCT 35.4* 32.8* 31.1* 29.6* 30.0*  MCV 89.4 88.4 88.9 92.2 91.2  PLT 429* 351 305 278 306   Cardiac Enzymes: No results for input(s): "CKTOTAL", "CKMB", "CKMBINDEX", "TROPONINI" in the last 168 hours. BNP: BNP (last 3 results) No results for input(s): "BNP" in the last 8760 hours.  ProBNP (last 3 results) No results for input(s): "PROBNP" in the last 8760 hours.  CBG: Recent Labs  Lab 08/18/21 1713 08/22/21 0802 08/22/21 1217 08/23/21 1145  GLUCAP 130* 87 107* 116*       Signed:  Desma Maxim MD.  [facility] 08/23/2021, 3:09 PM

## 2021-08-23 NOTE — Progress Notes (Signed)
Speech Language Pathology Treatment:    Patient Details Name: Courtney Lynn MRN: [id number] DOB: 1940/11/07 Today's Date: 08/23/2021 Time: 4098-1191 SLP Time Calculation (min) (ACUTE ONLY): 15 min  Assessment / Plan / Recommendation Clinical Impression  Pt seen for diet tolerance/clinical swallowing re-evaluation. Pt alert, notably confused. Restless. Difficult to redirect. Upon SLP entrance to room, pt removed L mitt. R mitt in placed. Attempted to don L mitt without success RN aware.   Pt given trials of thin liquids (via cup/straw), pureed, and solid. Pt with oral holding without attempts to manipulate solid; eventual expectorating. With cueing, pt consumed a few bites/sips of thin liquids via cup and straw and pureed with tsp. No overt s/sx pharyngeal dysphagia noted.   Concern for reduced PO intake/ability to meet nutritional needs. Pt may benefit from RD consult, if not already following.   Recommend diet downgraded to Dysphagia 1 (Pureed) Diet with Thin Liquids and safe swallowing strategies/aspiration precautions as outlined below.   Pt is at increased risk for aspiration/aspiration PNA given dependence for feeding AMS/cognitive decline, and multiple medical comorbidities. Risk appears to reduce with diet recommendations and safe swallowing strategies/aspiration precautions.   SLP to f/u per POC for diet tolerance and ongoing dysphagia management.   RN made aware of results, recommendations, and SLP POC. Signage in pt's room updated with diet recommendations and safe swallowing strategies/aspiration precautions.    HPI HPI: Courtney Lynn is a 81 y.o. female with medical history significant of HTN, HLD, stroke, dementia, GERD, depression, OSA on CPAP, mitral valve regurgitation, breast cancer (s/p of right partial mastectomy and radiation therapy), obesity with BMI 34.99, anemia, vertigo, PVC, dementia, mild pulmonary hypertension, PAF not anticoagulants, who presents with fall. Pt last  seen my speech therapy in Aug 2022, with recommendations for Dys 2 and Thin Liquids diet. Pt is currently on Dys 2 and thin liquids. CXR 08/18/21: Cardiomegaly without acute abnormality of the lungs in AP portable  projection. CT Head 08/19/21: No evidence of acute intracranial abnormality.  2. No evidence of acute fracture or traumatic malalignment the  cervical spine.  3. Remote left temporoparietal infarct.      SLP Plan  Continue with current plan of care      Recommendations for follow up therapy are one component of a multi-disciplinary discharge planning process, led by the attending physician.  Recommendations may be updated based on patient status, additional functional criteria and insurance authorization.    Recommendations  Diet recommendations: Dysphagia 1 (puree);Thin liquid Liquids provided via: Cup;Straw Medication Administration: Crushed with puree Supervision: Full supervision/cueing for compensatory strategies Compensations: Minimize environmental distractions;Small sips/bites;Slow rate (encouragement for POs; rest breaks as needed) Postural Changes and/or Swallow Maneuvers: Seated upright 90 degrees;Upright 30-60 min after meal                General recommendations:  (RD consult) Oral Care Recommendations: Oral care BID;Staff/trained caregiver to provide oral care Follow Up Recommendations: Skilled nursing-short term rehab (<3 hours/day) Assistance recommended at discharge: Frequent or constant Supervision/Assistance SLP Visit Diagnosis: Dysphagia, unspecified (R13.10) Plan: Continue with current plan of care          Courtney Lynn, M.S., [facility] [phone] Courtney Lynn)  Courtney Lynn  08/23/2021, 9:28 AM

## 2021-08-23 NOTE — TOC Transition Note (Addendum)
Transition of Care [facility]) - CM/SW Discharge Note   Patient Details  Name: CHANTEL TETI MRN: [id number] Date of Birth: Dec 30, 1940  Transition of Care [facility]) CM/SW Contact:  Izola Price, RN Phone Number: 08/23/2021, 10:22 AM   Clinical Narrative:  7/23: Insurance authorization from [employer] still pending. Representative to call RN CM this weekend if authorization completed. Facility was notified 7/22 of pending discharge/transfer to facility. Updated FL2 uploaded via electronic hub. Simmie Davies RN CM    UPDATE:  [facility] transport approval ID# [id number] back to LTC. Denied STR but no autho required for return to LTC. Notified AC and transferring back to LTC at [facility] today. Simmie Davies RN CM   Final next level of care: [place] (LTC at [facility]) Barriers to Discharge: Insurance Authorization   Patient Goals and CMS Choice Patient states their goals for this hospitalization and ongoing recovery are:: to go home CMS Medicare.gov Compare Post Acute Care list provided to:: Patient Choice offered to / list presented to : Patient  Discharge Placement                       Discharge Plan and Services                DME Arranged: N/A DME Agency: NA       HH Arranged: NA HH Agency: NA        Social Determinants of Health (SDOH) Interventions     Readmission Risk Interventions     No data to display

## 2021-08-23 NOTE — TOC Transition Note (Addendum)
Transition of Care [facility]) - CM/SW Discharge Note   Patient Details  Name: SAKSHI SERMONS MRN: [id number] Date of Birth: 1940-07-11  Transition of Care [facility]) CM/SW Contact:  Izola Price, RN Phone Number: 08/23/2021, 3:27 PM   Clinical Narrative:  7/23: DC Summary uploaded to electronic hub. Going to Room 304 (LTC).  Report to be called to [phone]. DC Summary inboxed via hub to facility. AC notified. EMS forms printed to unit. DNR form signed and on hard chart per provider. [facility] approval from [facility] [phone]. Simmie Davies RN CM   355 pm [facility] called for transport. Simmie Davies RN CM     Final next level of care: Long Term Nursing Home Barriers to Discharge: Barriers Resolved   Patient Goals and CMS Choice Patient states their goals for this hospitalization and ongoing recovery are:: to go home CMS Medicare.gov Compare Post Acute Care list provided to:: Patient Choice offered to / list presented to : Patient  Discharge Placement                       Discharge Plan and Services                DME Arranged: N/A DME Agency: NA       HH Arranged: NA HH Agency: NA        Social Determinants of Health (SDOH) Interventions     Readmission Risk Interventions     No data to display

## 2021-08-23 NOTE — Progress Notes (Signed)
[place] and reported to [place], LPN EMS transported pt from [facility] to [place] facility at this time.

## 2021-08-23 NOTE — Plan of Care (Signed)
  Problem: Clinical Measurements: Goal: Ability to maintain clinical measurements within normal limits will improve 08/23/2021 1622 by Jamarkis Branam Bet, LPN Outcome: Adequate for Discharge 08/23/2021 1622 by Chay Mazzoni Bet, LPN Outcome: Progressing   Problem: Health Behavior/Discharge Planning: Goal: Ability to manage health-related needs will improve 08/23/2021 1622 by Keaston Pile Bet, LPN Outcome: Adequate for Discharge 08/23/2021 1622 by Jaquell Seddon Bet, LPN Outcome: Progressing   Problem: Clinical Measurements: Goal: Will remain free from infection 08/23/2021 1622 by Fabiha Rougeau Bet, LPN Outcome: Adequate for Discharge 08/23/2021 1622 by Hisayo Delossantos Bet, LPN Outcome: Progressing   Problem: Clinical Measurements: Goal: Diagnostic test results will improve 08/23/2021 1622 by Roselin Wiemann Bet, LPN Outcome: Adequate for Discharge 08/23/2021 1622 by Katelyn Kohlmeyer Bet, LPN Outcome: Progressing   Problem: Coping: Goal: Level of anxiety will decrease 08/23/2021 1622 by Natash Berman Bet, LPN Outcome: Adequate for Discharge 08/23/2021 1622 by Unknown Schleyer Bet, LPN Outcome: Progressing   Problem: Elimination: Goal: Will not experience complications related to bowel motility 08/23/2021 1622 by Tianah Lonardo Bet, LPN Outcome: Adequate for Discharge 08/23/2021 1622 by Sabina Beavers Bet, LPN Outcome: Progressing   Problem: Elimination: Goal: Will not experience complications related to urinary retention 08/23/2021 1622 by Naraly Fritcher Bet, LPN Outcome: Adequate for Discharge 08/23/2021 1622 by Joscelin Fray Bet, LPN Outcome: Progressing   Problem: Pain Managment: Goal: General experience of comfort will improve 08/23/2021 1622 by Appollonia Klee Bet, LPN Outcome: Adequate for Discharge 08/23/2021 1622 by Luqman Perrelli Bet, LPN Outcome: Progressing

## 2021-08-23 NOTE — Progress Notes (Signed)
  Subjective:  POD #3 s/p left hip hemiarthroplasty.   Patient has advanced dementia and is unable to provide a history.  She is awake and appears to be comfortable and in no acute distress.  Patient has concomitant left distal radius and ulnar fractures as well as a radial head fracture.  She is currently in a sugar-tong splint.  Patient also has pubic rami fractures on the right which are nondisplaced and require no surgical intervention.  Objective:   VITALS:   Vitals:   08/22/21 1633 08/22/21 2058 08/23/21 0549 08/23/21 0813  BP: 107/82 124/87 110/82 (!) 138/99  Pulse: 73 83 78 66  Resp: '16 20 18 16  '$ Temp: 98.2 F (36.8 C) 98.4 F (36.9 C) 98.2 F (36.8 C) 98.7 F (37.1 C)  TempSrc:      SpO2: 94% 94% 97% 97%  Weight:      Height:        PHYSICAL EXAM: Left lower extremity: Intact pulses distally Dorsiflexion/Plantar flexion intact Incision: scant drainage No cellulitis present Compartment soft  Left upper extremity: Patient's sugar-tong splint is intact and clean.  Patient's fingers well-perfused.   LABS  Results for orders placed or performed during the hospital encounter of 08/18/21 (from the past 24 hour(s))  Basic metabolic panel     Status: Abnormal   Collection Time: 08/23/21  4:18 AM  Result Value Ref Range   Sodium 134 (L) 135 - 145 mmol/L   Potassium 4.0 3.5 - 5.1 mmol/L   Chloride 107 98 - 111 mmol/L   CO2 22 22 - 32 mmol/L   Glucose, Bld 125 (H) 70 - 99 mg/dL   BUN 42 (H) 8 - 23 mg/dL   Creatinine, Ser 0.91 0.44 - 1.00 mg/dL   Calcium 8.1 (L) 8.9 - 10.3 mg/dL   GFR, Estimated >60 >60 mL/min   Anion gap 5 5 - 15  Glucose, capillary     Status: Abnormal   Collection Time: 08/23/21 11:45 AM  Result Value Ref Range   Glucose-Capillary 116 (H) 70 - 99 mg/dL    No results found.  Assessment/Plan: 3 Days Post-Op   Principal Problem:   Fracture of femoral neck, left, closed ([place]) Active Problems:   Hypertension   Essential hypertension   OSA  on CPAP   Dementia (HCC)   Fall   Left wrist fracture   Pubic ramus fracture, left, closed, initial encounter ([place])   Closed compression fracture of body of L1 vertebra (HCC)   PAF (paroxysmal atrial fibrillation) (HCC)   Depression   HLD (hyperlipidemia)   Obesity with body mass index (BMI) of 30.0 to 39.9   Iron deficiency anemia   Leukocytosis   Chronic kidney disease, stage 3a ([place])   Fracture of radial neck, left, closed   Stroke [facility])  Patient is stable from an orthopedic standpoint.  Continue sugar-tong splinting of the left upper extremity.  Patient is nonweightbearing on the left wrist and forearm but may use a platform walker.  She is weightbearing as tolerated on both lower extremities.  She should continue posterior hip precautions for the left hip for the first 6 months postop.  Patient should continue physical therapy as she can participate.  Patient will return to [facility] upon discharge.  She will follow-up with orthopedics 1 to 2 weeks following discharge.  Continue Plavix and Lovenox until follow-up.    Thornton Park , MD 08/23/2021, 3:01 PM

## 2021-08-24 DIAGNOSIS — S32512D Fracture of superior rim of left pubis, subsequent encounter for fracture with routine healing: Secondary | ICD-10-CM | POA: Diagnosis not present

## 2021-08-24 DIAGNOSIS — M6281 Muscle weakness (generalized): Secondary | ICD-10-CM | POA: Diagnosis not present

## 2021-08-24 DIAGNOSIS — R4701 Aphasia: Secondary | ICD-10-CM | POA: Diagnosis not present

## 2021-08-24 DIAGNOSIS — S72002A Fracture of unspecified part of neck of left femur, initial encounter for closed fracture: Secondary | ICD-10-CM | POA: Diagnosis not present

## 2021-08-24 DIAGNOSIS — R2681 Unsteadiness on feet: Secondary | ICD-10-CM | POA: Diagnosis not present

## 2021-08-24 DIAGNOSIS — Y92129 Unspecified place in nursing home as the place of occurrence of the external cause: Secondary | ICD-10-CM | POA: Diagnosis not present

## 2021-08-24 DIAGNOSIS — F0280 Dementia in other diseases classified elsewhere without behavioral disturbance: Secondary | ICD-10-CM | POA: Diagnosis not present

## 2021-08-24 DIAGNOSIS — I639 Cerebral infarction, unspecified: Secondary | ICD-10-CM | POA: Diagnosis not present

## 2021-08-24 DIAGNOSIS — S52572D Other intraarticular fracture of lower end of left radius, subsequent encounter for closed fracture with routine healing: Secondary | ICD-10-CM | POA: Diagnosis not present

## 2021-08-24 DIAGNOSIS — Z741 Need for assistance with personal care: Secondary | ICD-10-CM | POA: Diagnosis not present

## 2021-08-24 DIAGNOSIS — S52302A Unspecified fracture of shaft of left radius, initial encounter for closed fracture: Secondary | ICD-10-CM | POA: Diagnosis not present

## 2021-08-24 DIAGNOSIS — R278 Other lack of coordination: Secondary | ICD-10-CM | POA: Diagnosis not present

## 2021-08-24 DIAGNOSIS — R2689 Other abnormalities of gait and mobility: Secondary | ICD-10-CM | POA: Diagnosis not present

## 2021-08-24 DIAGNOSIS — S32010D Wedge compression fracture of first lumbar vertebra, subsequent encounter for fracture with routine healing: Secondary | ICD-10-CM | POA: Diagnosis not present

## 2021-08-24 DIAGNOSIS — S32512A Fracture of superior rim of left pubis, initial encounter for closed fracture: Secondary | ICD-10-CM | POA: Diagnosis not present

## 2021-08-24 DIAGNOSIS — R262 Difficulty in walking, not elsewhere classified: Secondary | ICD-10-CM | POA: Diagnosis not present

## 2021-08-24 DIAGNOSIS — S72002D Fracture of unspecified part of neck of left femur, subsequent encounter for closed fracture with routine healing: Secondary | ICD-10-CM | POA: Diagnosis not present

## 2021-08-24 DIAGNOSIS — R4189 Other symptoms and signs involving cognitive functions and awareness: Secondary | ICD-10-CM | POA: Diagnosis not present

## 2021-08-28 ENCOUNTER — Non-Acute Institutional Stay: Payer: PPO

## 2021-08-28 VITALS — BP 110/64 | HR 72

## 2021-08-28 DIAGNOSIS — Z515 Encounter for palliative care: Secondary | ICD-10-CM

## 2021-08-28 NOTE — Progress Notes (Signed)
PATIENT NAME: Courtney Lynn DOB: 1940-06-23 MRN: [id number]  PRIMARY CARE PROVIDER: Venia Carbon, MD  RESPONSIBLE PARTY:  Acct ID - Guarantor Home Phone Work Phone Relationship Acct Type  [id number] - Deguire,[phone]  Self P/F     [address], [place] [address]    Initial visit completed for Palliative Care Consult.    Dementia:  Patient is unable to engage in any meaningful conversation today.  No behavioral issues reported by staff.  She is bed-bound and requires extensive assistance with adl's.  Staff report patient is now a feeder.  She was ambulatory with a walker prior to her last hospitalization.  Incontinent of bowel and bladder.   Skin:  no skin breakdown reported by Mohammed Kindle, RN.  Patient has surgical wound to left hip.   Hospice:  Phone call made to brother [name] to follow up on today's visit.  He is aware of patient status and also provides some additional background information.  [name] advises patient had a stroke last year and when he visited her in September she did not recognize him.   Discussed concerns over patient's decline, bedbound status, risk for skin breakdown and risk for infection such as pneumonia.   [name] does not believe patient will return to her previous baseline but would like her to have the best care possible.  We discussed PT/OT may possibly pick patient up but that is pending insurance approval.  Discussed hospice support if patient is unable to participate in therapy or declines further.  [name] is open to hospice support as long as insurance covers.  He also shares his recent experience with hospice support and is very familiar with services.  Advised that [facility] NP would follow up with patient and contact him after her visit to provide an update and recommendations.  Palliative Care number provided in the event, he needs to reach [place].  No other concerns voiced at this time.     HISTORY OF PRESENT ILLNESS:  81 year old female with a history of  stroke, dementia, HTN, HLD, GERD, Depression and OSA.  Patient was hopsitalized on 08/18/21-08/23/21 due to a fall in the facility.  She sustained multiple fractures including:Fracture of femoral neck, left, closed, Left wrist fracture, Fracture of radial neck, left, closed, Pubic ramus fracture, left, closed, and Closed compression fracture of body of L1 vertebra.  Patient is now bed-bound and requires assistance with ADL's including feeding.  Patient is incontinent of bowel and bladder.  Per staff Mohammed Kindle, RN she has not started PT/OT but they are working to get this started.   CODE STATUS: DNR  ADVANCED DIRECTIVES: No MOST FORM: No PPS: 30%   PHYSICAL EXAM:   VITALS: Today's Vitals   08/28/21 1339  BP: 110/64  Pulse: 72  SpO2: 93%    LUNGS: clear to auscultation  CARDIAC: Cor RRR}  EXTREMITIES: non-pitting edema present to bilateral ankles. SKIN: Skin color, texture, turgor normal. No rashes or lesions or surgical wound.   NEURO: positive for gait problems, memory problems, and weakness       Lorenza Burton, RN

## 2021-08-31 ENCOUNTER — Encounter: Payer: Self-pay | Admitting: Orthopedic Surgery

## 2021-08-31 DIAGNOSIS — S72002A Fracture of unspecified part of neck of left femur, initial encounter for closed fracture: Secondary | ICD-10-CM | POA: Diagnosis not present

## 2021-08-31 DIAGNOSIS — S32592A Other specified fracture of left pubis, initial encounter for closed fracture: Secondary | ICD-10-CM | POA: Diagnosis not present

## 2021-08-31 DIAGNOSIS — S52125A Nondisplaced fracture of head of left radius, initial encounter for closed fracture: Secondary | ICD-10-CM | POA: Diagnosis not present

## 2021-08-31 DIAGNOSIS — S52502A Unspecified fracture of the lower end of left radius, initial encounter for closed fracture: Secondary | ICD-10-CM | POA: Diagnosis not present

## 2021-09-01 DIAGNOSIS — I10 Essential (primary) hypertension: Secondary | ICD-10-CM | POA: Diagnosis not present

## 2021-09-01 DIAGNOSIS — Z741 Need for assistance with personal care: Secondary | ICD-10-CM | POA: Diagnosis not present

## 2021-09-01 DIAGNOSIS — G301 Alzheimer's disease with late onset: Secondary | ICD-10-CM | POA: Diagnosis not present

## 2021-09-01 DIAGNOSIS — R4189 Other symptoms and signs involving cognitive functions and awareness: Secondary | ICD-10-CM | POA: Diagnosis not present

## 2021-09-01 DIAGNOSIS — F0280 Dementia in other diseases classified elsewhere without behavioral disturbance: Secondary | ICD-10-CM | POA: Diagnosis not present

## 2021-09-01 DIAGNOSIS — S32512D Fracture of superior rim of left pubis, subsequent encounter for fracture with routine healing: Secondary | ICD-10-CM | POA: Diagnosis not present

## 2021-09-01 DIAGNOSIS — R4701 Aphasia: Secondary | ICD-10-CM | POA: Diagnosis not present

## 2021-09-01 DIAGNOSIS — F0150 Vascular dementia without behavioral disturbance: Secondary | ICD-10-CM | POA: Diagnosis not present

## 2021-09-01 DIAGNOSIS — S32010D Wedge compression fracture of first lumbar vertebra, subsequent encounter for fracture with routine healing: Secondary | ICD-10-CM | POA: Diagnosis not present

## 2021-09-01 DIAGNOSIS — K219 Gastro-esophageal reflux disease without esophagitis: Secondary | ICD-10-CM | POA: Diagnosis not present

## 2021-09-01 DIAGNOSIS — F39 Unspecified mood [affective] disorder: Secondary | ICD-10-CM | POA: Diagnosis not present

## 2021-09-01 DIAGNOSIS — R262 Difficulty in walking, not elsewhere classified: Secondary | ICD-10-CM | POA: Diagnosis not present

## 2021-09-01 DIAGNOSIS — R278 Other lack of coordination: Secondary | ICD-10-CM | POA: Diagnosis not present

## 2021-09-01 DIAGNOSIS — I639 Cerebral infarction, unspecified: Secondary | ICD-10-CM | POA: Diagnosis not present

## 2021-09-01 DIAGNOSIS — S72002D Fracture of unspecified part of neck of left femur, subsequent encounter for closed fracture with routine healing: Secondary | ICD-10-CM | POA: Diagnosis not present

## 2021-09-01 DIAGNOSIS — S52572D Other intraarticular fracture of lower end of left radius, subsequent encounter for closed fracture with routine healing: Secondary | ICD-10-CM | POA: Diagnosis not present

## 2021-09-01 DIAGNOSIS — R2681 Unsteadiness on feet: Secondary | ICD-10-CM | POA: Diagnosis not present

## 2021-09-01 DIAGNOSIS — S72002S Fracture of unspecified part of neck of left femur, sequela: Secondary | ICD-10-CM | POA: Diagnosis not present

## 2021-09-01 DIAGNOSIS — R2689 Other abnormalities of gait and mobility: Secondary | ICD-10-CM | POA: Diagnosis not present

## 2021-09-01 DIAGNOSIS — M6281 Muscle weakness (generalized): Secondary | ICD-10-CM | POA: Diagnosis not present

## 2021-09-15 DIAGNOSIS — S52502A Unspecified fracture of the lower end of left radius, initial encounter for closed fracture: Secondary | ICD-10-CM | POA: Diagnosis not present

## 2021-09-15 DIAGNOSIS — S52125A Nondisplaced fracture of head of left radius, initial encounter for closed fracture: Secondary | ICD-10-CM | POA: Diagnosis not present

## 2021-09-17 ENCOUNTER — Telehealth: Payer: Self-pay

## 2021-09-17 NOTE — Telephone Encounter (Signed)
1030 am.  Phone call made to [facility] and spoke with Mohammed Kindle, Therapist, sports.  Patient has started with OT but not PT.  She was recently sent out for ortho follow up appointment. Staff is now able to transfer patient to a chair via hoyer lift.  More alert now but remains confused which is her baseline.  Appetite remains good.  Update provided to [facility], NP.

## 2021-09-21 DIAGNOSIS — S52502A Unspecified fracture of the lower end of left radius, initial encounter for closed fracture: Secondary | ICD-10-CM | POA: Diagnosis not present

## 2021-09-21 DIAGNOSIS — S72002A Fracture of unspecified part of neck of left femur, initial encounter for closed fracture: Secondary | ICD-10-CM | POA: Diagnosis not present

## 2021-09-21 DIAGNOSIS — S32592A Other specified fracture of left pubis, initial encounter for closed fracture: Secondary | ICD-10-CM | POA: Diagnosis not present

## 2021-09-21 DIAGNOSIS — S52125A Nondisplaced fracture of head of left radius, initial encounter for closed fracture: Secondary | ICD-10-CM | POA: Diagnosis not present

## 2021-09-23 ENCOUNTER — Non-Acute Institutional Stay: Payer: PPO | Admitting: Student

## 2021-09-23 DIAGNOSIS — Z515 Encounter for palliative care: Secondary | ICD-10-CM

## 2021-10-03 DIAGNOSIS — I639 Cerebral infarction, unspecified: Secondary | ICD-10-CM | POA: Diagnosis not present

## 2021-10-03 DIAGNOSIS — S32010D Wedge compression fracture of first lumbar vertebra, subsequent encounter for fracture with routine healing: Secondary | ICD-10-CM | POA: Diagnosis not present

## 2021-10-03 DIAGNOSIS — M6281 Muscle weakness (generalized): Secondary | ICD-10-CM | POA: Diagnosis not present

## 2021-10-03 DIAGNOSIS — R2681 Unsteadiness on feet: Secondary | ICD-10-CM | POA: Diagnosis not present

## 2021-10-03 DIAGNOSIS — S72002D Fracture of unspecified part of neck of left femur, subsequent encounter for closed fracture with routine healing: Secondary | ICD-10-CM | POA: Diagnosis not present

## 2021-10-03 DIAGNOSIS — R278 Other lack of coordination: Secondary | ICD-10-CM | POA: Diagnosis not present

## 2021-10-03 DIAGNOSIS — F0280 Dementia in other diseases classified elsewhere without behavioral disturbance: Secondary | ICD-10-CM | POA: Diagnosis not present

## 2021-10-03 DIAGNOSIS — R4701 Aphasia: Secondary | ICD-10-CM | POA: Diagnosis not present

## 2021-10-03 DIAGNOSIS — S32512D Fracture of superior rim of left pubis, subsequent encounter for fracture with routine healing: Secondary | ICD-10-CM | POA: Diagnosis not present

## 2021-10-03 DIAGNOSIS — R262 Difficulty in walking, not elsewhere classified: Secondary | ICD-10-CM | POA: Diagnosis not present

## 2021-10-03 DIAGNOSIS — Z741 Need for assistance with personal care: Secondary | ICD-10-CM | POA: Diagnosis not present

## 2021-10-03 DIAGNOSIS — S52572D Other intraarticular fracture of lower end of left radius, subsequent encounter for closed fracture with routine healing: Secondary | ICD-10-CM | POA: Diagnosis not present

## 2021-10-03 DIAGNOSIS — R4189 Other symptoms and signs involving cognitive functions and awareness: Secondary | ICD-10-CM | POA: Diagnosis not present

## 2021-10-03 DIAGNOSIS — R2689 Other abnormalities of gait and mobility: Secondary | ICD-10-CM | POA: Diagnosis not present

## 2021-10-07 ENCOUNTER — Other Ambulatory Visit: Payer: Self-pay | Admitting: Student

## 2021-10-07 DIAGNOSIS — Z515 Encounter for palliative care: Secondary | ICD-10-CM

## 2021-10-07 DIAGNOSIS — S301XXA Contusion of abdominal wall, initial encounter: Secondary | ICD-10-CM

## 2021-10-07 DIAGNOSIS — S63104D Unspecified dislocation of right thumb, subsequent encounter: Secondary | ICD-10-CM

## 2021-10-07 DIAGNOSIS — F324 Major depressive disorder, single episode, in partial remission: Secondary | ICD-10-CM

## 2021-10-07 DIAGNOSIS — F0390 Unspecified dementia without behavioral disturbance: Secondary | ICD-10-CM

## 2021-10-07 DIAGNOSIS — D500 Iron deficiency anemia secondary to blood loss (chronic): Secondary | ICD-10-CM

## 2021-10-07 NOTE — Progress Notes (Addendum)
Patient is transitioning to hospice on 9/20. Consultation for Hospice and Morphine Rx placed.

## 2021-10-15 DIAGNOSIS — R413 Other amnesia: Secondary | ICD-10-CM | POA: Diagnosis not present

## 2021-10-20 ENCOUNTER — Non-Acute Institutional Stay: Payer: PPO | Admitting: Student

## 2021-10-20 DIAGNOSIS — S72002D Fracture of unspecified part of neck of left femur, subsequent encounter for closed fracture with routine healing: Secondary | ICD-10-CM

## 2021-10-20 DIAGNOSIS — F0390 Unspecified dementia without behavioral disturbance: Secondary | ICD-10-CM | POA: Diagnosis not present

## 2021-10-20 DIAGNOSIS — Z515 Encounter for palliative care: Secondary | ICD-10-CM

## 2021-10-20 DIAGNOSIS — S52132D Displaced fracture of neck of left radius, subsequent encounter for closed fracture with routine healing: Secondary | ICD-10-CM

## 2021-10-21 ENCOUNTER — Encounter: Payer: Self-pay | Admitting: Student

## 2021-10-21 ENCOUNTER — Non-Acute Institutional Stay (SKILLED_NURSING_FACILITY): Payer: PPO | Admitting: Student

## 2021-10-21 DIAGNOSIS — I480 Paroxysmal atrial fibrillation: Secondary | ICD-10-CM

## 2021-10-21 DIAGNOSIS — Z515 Encounter for palliative care: Secondary | ICD-10-CM | POA: Diagnosis not present

## 2021-10-21 DIAGNOSIS — S62102D Fracture of unspecified carpal bone, left wrist, subsequent encounter for fracture with routine healing: Secondary | ICD-10-CM

## 2021-10-21 DIAGNOSIS — S301XXA Contusion of abdominal wall, initial encounter: Secondary | ICD-10-CM | POA: Diagnosis not present

## 2021-10-21 DIAGNOSIS — I10 Essential (primary) hypertension: Secondary | ICD-10-CM | POA: Diagnosis not present

## 2021-10-21 DIAGNOSIS — F0390 Unspecified dementia without behavioral disturbance: Secondary | ICD-10-CM | POA: Diagnosis not present

## 2021-10-21 DIAGNOSIS — F3340 Major depressive disorder, recurrent, in remission, unspecified: Secondary | ICD-10-CM

## 2021-10-21 DIAGNOSIS — I639 Cerebral infarction, unspecified: Secondary | ICD-10-CM

## 2021-10-21 DIAGNOSIS — N1831 Chronic kidney disease, stage 3a: Secondary | ICD-10-CM

## 2021-10-21 MED ORDER — MORPHINE SULFATE (CONCENTRATE) 20 MG/ML PO SOLN: 5.0000 mg | ORAL | 0 refills | Status: DC | PRN

## 2021-10-21 NOTE — Progress Notes (Addendum)
Location:  [facility] Room Number: NO/304/A Place of Service:  SNF (31) Provider:  Dewayne Shorter, MD  Patient Care Team: Dewayne Shorter, MD as PCP - General (Family Medicine)  Extended Emergency Contact Information Primary Emergency Contact: Courtney Lynn [place] of [place] Phone: [phone] Relation: Brother  Code Status:  DNR Goals of care: Advanced Directive information    10/21/2021    4:20 PM  Advanced Directives  Does Patient Have a Medical Advance Directive? Yes  Type of Advance Directive Out of facility DNR (pink MOST or yellow form)  Does patient want to make changes to medical advance directive? No - Patient declined     Chief Complaint  Patient presents with   Acute Visit    Patient is being seen for alzheimer's with behavioral concerns    HPI:  Pt is a 81 y.o. female seen today for an acute visit for an expanding hematoma. Patient is unable to communicate other than nonsensical words which is her functional baseline. Patient is unable to clearly respond to state whether she has pain in the area. She does say random words like she wants to get out, or "it hurts" when referencing the BP cuff.   Nursing states she had a bruise on the left flank the size of an orange yesterday that has expanded to include her entire left side. Nurse was notified this morning around 9:15 and she states that since this morning the bruise has gotten larger and darker.    Advance Care Planning   Spoke with her brother Courtney Lynn over the phone to discuss concern for the worsening bruise. Discussed concern that this type of expanding hematoma is likely due to patient's plavix. Discussed with him based on the location and distribution it is unlikely to be from a fall as there are no additional bruises present to appear a such. Discussed concern that given the the extent of the bruise and how quickly it is changing she would need a transfer to the  emergency department to have further imaging. I discussed with him sometimes discontinuing plavix leads to stabilization of the hematoma and if she transfers to the hospital there is possibility of needing a procedure or surgery to stop the bleeding. Before finishing the sentence, Courtney Lynn states that his sister has lost her quality of life due to her stage of dementia and he would prefer to keep her comfortable. Discussed the vital sign instability and Courtney Lynn states he would prefer to keep her at [facility] and keep her comfortable. When this was mentioned further clarified whether he would like to keep her at [facility] comfortable, or if he would like her to transition to hospice care. He stated he would like to transition to comfort care. We discussed lab collection, and he stated he would like to keep her comfortable. Will plan to hold plavix to try to stabilize the hematoma and offer medication to maintain comfort.      Past Medical History:  Diagnosis Date   Anemia    Arthritis    Body mass index (bmi) 37.0-37.9, adult    Breast cancer ([place]) 01/02/2016   right breast/radiation   Cancer ([place]) 2017   BREAST   Chronic insomnia    Complication of anesthesia    has difficulty last surgery intubating   Depression    Difficult intubation    WITH TKR   Dizziness    Dysrhythmia    PVC'S,BRADYCARDIA   GERD (gastroesophageal reflux disease)  History of chicken pox    Hypertension    Hyponatremia    Intermittent vertigo    Irregular heart beat    Memory difficulties    Mild mitral regurgitation    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HCC)    MVP (mitral valve prolapse)    Orthopnea    Personal history of radiation therapy 2017   Pre-diabetes    PVC's (premature ventricular contractions)    Reflux    Sleep apnea    CPAP   Past Surgical History:  Procedure Laterality Date   APPENDECTOMY     BREAST BIOPSY Right 01/02/2016   PARTIAL MASTECTOMY- [facility] and [facility]   BREAST LUMPECTOMY Right 2017    CATARACT EXTRACTION W/PHACO Right 09/14/2016   Procedure: CATARACT EXTRACTION PHACO AND INTRAOCULAR LENS PLACEMENT ([place]);  Surgeon: Birder Robson, MD;  Location: [facility] ORS;  Service: Ophthalmology;  Laterality: Right;  [place] 00:32 AP% 19.5 CDE 6.28 Fluid pack lot # [id number]   CATARACT EXTRACTION W/PHACO Left 10/05/2016   Procedure: CATARACT EXTRACTION PHACO AND INTRAOCULAR LENS PLACEMENT ([facility]);  Surgeon: Birder Robson, MD;  Location: [facility] ORS;  Service: Ophthalmology;  Laterality: Left;  [place] 00:46.9 AP% 16.0 CDE 7.48 Fluid Pack lot # [id number]   CHOLECYSTECTOMY     COLONOSCOPY WITH PROPOFOL N/A 08/24/2016   Procedure: COLONOSCOPY WITH PROPOFOL;  Surgeon: Lollie Sails, MD;  Location: [facility] ENDOSCOPY;  Service: Endoscopy;  Laterality: N/A;   COLONOSCOPY WITH PROPOFOL N/A 07/12/2018   Procedure: COLONOSCOPY WITH PROPOFOL;  Surgeon: Toledo, Benay Pike, MD;  Location: [facility] ENDOSCOPY;  Service: Gastroenterology;  Laterality: N/A;   dental implants     FOOT SURGERY Right    FRACTURE SURGERY     KNEE CAP   HEMORRHOID SURGERY N/A 04/19/2019   Procedure: HEMORRHOIDECTOMY;  Surgeon: Benjamine Sprague, DO;  Location: [facility] ORS;  Service: General;  Laterality: N/A;   HIP ARTHROPLASTY Left 08/20/2021   Procedure: ARTHROPLASTY BIPOLAR HIP (HEMIARTHROPLASTY);  Surgeon: Thornton Park, MD;  Location: [facility] ORS;  Service: Orthopedics;  Laterality: Left;   JOINT REPLACEMENT Right 2011   KNEE   PARTIAL MASTECTOMY WITH NEEDLE LOCALIZATION Right 01/02/2016   Procedure: PARTIAL MASTECTOMY WITH NEEDLE LOCALIZATION;  Surgeon: Leonie Green, MD;  Location: [facility] ORS;  Service: General;  Laterality: Right;   PATELLA FRACTURE SURGERY     REPLACEMENT TOTAL KNEE Right    SENTINEL NODE BIOPSY Right 01/02/2016   Procedure: SENTINEL NODE BIOPSY;  Surgeon: Leonie Green, MD;  Location: [facility] ORS;  Service: General;  Laterality: Right;   TONSILLECTOMY      Allergies  Allergen Reactions   Cleocin [Clindamycin  Hcl] Swelling, Rash and Other (See Comments)    Tongue, lip swelling Tongue, lip swelling Tongue, lip swelling   Pen Vk [Penicillin V] Swelling, Rash and Other (See Comments)    Tongue, lip swelling Funny feeling on the tongue  Has patient had a PCN reaction causing immediate rash, facial/tongue/throat swelling, SOB or lightheadedness with hypotension:Yes Has patient had a PCN reaction causing severe rash involving mucus membranes or skin necrosis:No Has patient had a PCN reaction that required hospitalization:No Has patient had a PCN reaction occurring within the last 10 years:No If all of the above answers are "NO", then may proceed with Cephalosporin use.     Outpatient Encounter Medications as of 10/21/2021  Medication Sig   acetaminophen (TYLENOL) 500 MG tablet Take 1,000 mg by mouth at bedtime.   carvedilol (COREG) 6.25 MG tablet Take 1 tablet (6.25 mg total) by mouth 2 (  two) times daily with a meal.   citalopram (CELEXA) 10 MG tablet Take 10 mg by mouth every other day.   donepezil (ARICEPT) 10 MG tablet Take 10 mg by mouth at bedtime.   famotidine (PEPCID) 20 MG tablet Take 1 tablet by mouth daily.   gabapentin (NEURONTIN) 100 MG capsule Take 200 mg by mouth at bedtime.   iron polysaccharides (NIFEREX) 150 MG capsule Take 150 mg by mouth daily. (Patient not taking: Reported on 08/18/2021)   lactase (LACTAID) 3000 units tablet Take 1 tablet by mouth daily.   MODERNA COVID-19 BIVALENT 50 MCG/0.5ML injection    Multiple Vitamin (MULTIVITAMIN) tablet Take 1 tablet by mouth 2 (two) times daily.   polyethylene glycol (MIRALAX / GLYCOLAX) 17 g packet Take 17 g by mouth daily.   traZODone (DESYREL) 50 MG tablet Take 50 mg by mouth at bedtime.   Wheat Dextrin (BENEFIBER) CHEW Chew 1 tablet by mouth daily.   [DISCONTINUED] atorvastatin (LIPITOR) 80 MG tablet Take 1 tablet (80 mg total) by mouth daily.   [DISCONTINUED] clopidogrel (PLAVIX) 75 MG tablet Take 1 tablet (75 mg total) by mouth  daily.   [DISCONTINUED] enoxaparin (LOVENOX) 40 MG/0.4ML injection Inject 0.4 mLs (40 mg total) into the skin daily.   [DISCONTINUED] methocarbamol (ROBAXIN) 500 MG tablet Take 500 mg by mouth at bedtime. (Patient not taking: Reported on 08/18/2021)   No facility-administered encounter medications on file as of 10/21/2021.    Review of Systems  Unable to perform ROS: Dementia    Immunization History  Administered Date(s) Administered   Influenza Inj Mdck Quad Pf 11/15/2017   Influenza-Unspecified 11/01/2013, 11/02/2014, 10/30/2015, 12/17/2018   Moderna Sars-Covid-2 Vaccination 03/19/2019, 04/16/2019   Pneumococcal Conjugate-13 04/02/2015   Pneumococcal-Unspecified 02/01/2002, 11/02/2006   Tdap 03/12/2018, 07/13/2020   Pertinent  Health Maintenance Due  Topic Date Due   DEXA SCAN  Never done   INFLUENZA VACCINE  09/01/2021      08/21/2021    7:45 PM 08/22/2021    3:00 PM 08/22/2021    7:45 PM 08/23/2021    4:00 PM 10/21/2021    4:21 PM  Fall Risk  Falls in the past year?     0  Patient Fall Risk Level High fall risk High fall risk High fall risk High fall risk Low fall risk  Patient at Risk for Falls Due to     No Fall Risks  Fall risk Follow up     Falls evaluation completed   Functional Status Survey:    Vitals:   10/21/21 1544  BP: (!) 209/109  Pulse: (!) 143  SpO2: 92%   There is no height or weight on file to calculate BMI. Physical Exam Constitutional:      Appearance: She is obese.  Cardiovascular:     Rate and Rhythm: Tachycardia present.  Pulmonary:     Effort: Pulmonary effort is normal. No respiratory distress.  Abdominal:     General: Abdomen is flat.     Palpations: Abdomen is soft.  Genitourinary:    Comments: Wet from urinary incontinence Neurological:     Mental Status: She is alert. Mental status is at baseline. She is disoriented.      Labs reviewed: Recent Labs    08/21/21 0441 08/22/21 0429 08/23/21 0418  NA 139 139 134*  K 4.3 4.9  4.0  CL 109 107 107  CO2 '24 25 22  '$ GLUCOSE 89 88 125*  BUN 42* 52* 42*  CREATININE 1.22* 1.26* 0.91  CALCIUM  8.5* 8.6* 8.1*   Recent Labs    08/18/21 1123  AST 32  ALT 28  ALKPHOS 115  BILITOT 0.7  PROT 6.5  ALBUMIN 3.4*   Recent Labs    08/18/21 1123 08/19/21 0527 08/20/21 0417 08/21/21 0441 08/22/21 0429  WBC 23.7*   < > 15.4* 17.0* 14.5*  NEUTROABS 20.4*  --   --   --   --   HGB 11.6*   < > 10.2* 9.5* 9.7*  HCT 35.4*   < > 31.1* 29.6* 30.0*  MCV 89.4   < > 88.9 92.2 91.2  PLT 429*   < > 305 278 306   < > = values in this interval not displayed.   Lab Results  Component Value Date   TSH 0.754 07/14/2020   Lab Results  Component Value Date   HGBA1C 5.3 09/25/2020   Lab Results  Component Value Date   CHOL 234 (H) 09/25/2020   HDL 62 09/25/2020   LDLCALC 162 (H) 09/25/2020   TRIG 50 09/25/2020   CHOLHDL 3.8 09/25/2020    Significant Diagnostic Results in last 30 days:  No results found.  Assessment/Plan 1. Hospice care patient Referral written to hospice to help with management of vital sign abnormalities and maintaining comfort. Minimizing medications to necessary meds. Will start Morphine '5mg'$  q1hr PRN for pain, dyspnea, and air hunger.   2. Dementia without behavioral disturbance ([place]) Patient with a history of dementia which has progressed significantly in the last few days. Discontinue Aricept and gabapentin. Continue nightly trazodone to aid with sleep-wake cycle.   3. Hematoma of abdominal wall, initial encounter Patient's hematoma is non-traumatic appearing in nature. There was no fall, and no clear mechanism of injury. Initially thought it was due to gait belt, however, the distribution is unilateral. There could be some internal bleeding as a result of the clopidogrel. Will plan to discontinue clopidogrel. Experiencing some vital sign instability in the setting of likely low hemoglobin. Deferring labs a this time per family/HCPOA preference. Will  defer hospitalization at this time and transition to hospice.   4. Closed fracture of left wrist with routine healing, subsequent encounter Injured on 08/18/21 with ground-level fall. Patient has follow up with orthopedic surgery, will keep appointment for cast removal.   5. PAF (paroxysmal atrial fibrillation) ([place]) HR in the 140s today consistent with afib w/ RVR. Most likely cause is worsened anemia in the setting of worsening hematoma. Most recent Hgb ~9 in the hospital 2 months ago. NO labs collected per family preference. Will plan for metoprolol 12.5 mg q6 PRN for HR >135.   6. Chronic kidney disease, stage 3a ([place]) Patient with history of CKD. Urine output is adequate at this time, however, concern for changes in urine output secondary to anemia. Will continue to monitor.   7. Major depressive disorder, recurrent, in remission ([place]) Patient's mood is stable on escitalopram 10 mg. Will continue at this time.   8. Cerebrovascular accident (CVA), unspecified mechanism ([place]) Patient has been on plavix since CVA. Concern this caused a spontaneous bleed. Discontinue plavix at this time. Given goals of care will discontinue atorvastatin as well.   9. Poorly-controlled hypertension BP elevated today on examination. Very difficult to assess vital signs as patient has constant movement throughout measurement. Patient is on carvedilol 6.25 mg nightly. Will continue to monitor BP to prevent discomfort with increased BP. Will continue discussion with hospice providers for necessity of this change.    Family/ staff Communication: Brother  Courtney Lynn, nursing staff.   Labs/tests ordered:  deferred per family preference  Tomasa Rand, MD, [employer] [phone]

## 2021-10-21 NOTE — Addendum Note (Signed)
Addended by: Dewayne Shorter on: 10/21/2021 04:36 PM   Modules accepted: Orders

## 2021-10-22 NOTE — Progress Notes (Signed)
[facility] Consult Note Telephone: [phone]  Fax: [phone]    Date of encounter: 10/20/21  PATIENT NAME: Courtney Lynn [address] [place] [address]   [phone] (home)  DOB: [birth year] MRN: [id number] PRIMARY CARE PROVIDER:    Dewayne Shorter, MD,  [place] [address] [phone]  REFERRING PROVIDER:   Venia Carbon, MD [address] [place],  [place] [address] [phone]  RESPONSIBLE PARTY:    Contact Information     Name Relation Home Work Mobile   Anttila,Peter Brother [phone]          I met face to face with patient in the facility. Palliative Care was asked to follow this patient by consultation request of  Venia Carbon, MD to address advance care planning and complex medical decision making. This is a follow up visit.                                   ASSESSMENT AND PLAN / RECOMMENDATIONS:   Advance Care Planning/Goals of Care: Goals include to maximize quality of life and symptom management. Patient/health care surrogate gave his/her permission to discuss. Our advance care planning conversation included a discussion about:    The value and importance of advance care planning  Experiences with loved ones who have been seriously ill or have died  Exploration of personal, cultural or spiritual beliefs that might influence medical decisions  Exploration of goals of care in the event of a sudden injury or illness  CODE STATUS: DNR  Education provided on palliative medicine. We also discussed hospice services should patient decline further; will monitor closely for changes and declines. Patient is currently receiving therapy per staff.  She is to follow-up with orthopedics within the next week.    Symptom Management/Plan:  Dementia-patient is dependent for all adl's. Staff report patient beng stable at this time. She is feeding herself; good appetite per staff. Her  speech is mostly nonsensical, gibberish. Nursing staff anticipates care needs. Continue Aricept; this can likely be discontinued in near future given her advanced dementia. Monitor for falls safety.  Femoral neck fracture, distal radial fracture, pubic rami fracture-patient now requires assist x 1 for transfers, able to bear weight. Her pain has been managed. She has cast in place to left arm/wrist. Patient is to follow up with orthopedics in next week.   Follow up Palliative Care Visit: Palliative care will continue to follow for complex medical decision making, advance care planning, and clarification of goals. Return in 4 weeks or prn.   This visit was coded based on medical decision making (MDM).  PPS: 30%  HOSPICE ELIGIBILITY/DIAGNOSIS: TBD  Chief Complaint: Palliative Medicine follow up visit.   HISTORY OF PRESENT ILLNESS:  Courtney Lynn is a 81 y.o. year old female  with dementia, osteoarthritis, closed displaced fracture of left femur with routine healing, left wrist fracture, left radial neck fracture, left pubic rami fracture, closed compression fracture of L1 vertebra, hypertensive chronic kidney disease 3,  hypertension, hyperlipidemia, iron deficiency anemia, mitral regurgitation, paroxysmal atrial fibrillation, aphasia, anxiety, difficulty ambulating, depression, GERD, hx of breast cancer, hx of CVA.   Patient resides at [facility].  Staff report patient has been stable. No recent falls reported. Staff endorse good appetite, patient is able to feed self now.  She is receiving a regular NAS diet.  Patient  requires assist x1 for transfers at this time.  Staff reports patient is still receiving therapy.  She has cast in place to left arm/wrist. She is to follow up with orthopedics next week.   Patient received sitting in common area.  She arouses to verbal and tactile stimulation.  She does not exhibit any signs of pain or discomfort.  Her speech is mostly nonsensical very few words  are clear.  No anxiety or agitation observed during visit.  History obtained from review of EMR, discussion with primary team, and interview with family, facility staff/caregiver and/or Ms. Sobczak.  I reviewed available labs, medications, imaging, studies and related documents from the EMR.  Records reviewed and summarized above.   ROS  Unable to contribute due to her dementia.   Physical Exam: Weight 184.4 pounds Pulse 104, respirations 20, sats 98% on room air Constitutional: NAD General: frail appearing, obese  EYES: anicteric sclera, lids intact, no discharge  ENMT: intact hearing, oral mucous membranes moist, dentition intact CV: S1S2, RRR, 2+ LE edema Pulmonary: LCTA, no increased work of breathing, no cough, room air Abdomen: normo-active BS + 4 quadrants, soft and non tender, no ascites GU: deferred MSK: moves all extremities, cast to left arm/wrist Skin: warm and dry, no rashes or wounds on visible skin Neuro:  + generalized weakness,  alert, disoriented Psych: non-anxious affect Hem/lymph/immuno: no widespread bruising   Thank you for the opportunity to participate in the care of Ms. Thayne.  The palliative care team will continue to follow. Please call our office at [phone] if we can be of additional assistance.   Ezekiel Slocumb, NP   COVID-19 PATIENT SCREENING TOOL Asked and negative response unless otherwise noted:   Have you had symptoms of covid, tested positive or been in contact with someone with symptoms/positive test in the past 5-10 days? No

## 2021-10-26 DIAGNOSIS — S72002A Fracture of unspecified part of neck of left femur, initial encounter for closed fracture: Secondary | ICD-10-CM | POA: Diagnosis not present

## 2021-10-26 DIAGNOSIS — S52125A Nondisplaced fracture of head of left radius, initial encounter for closed fracture: Secondary | ICD-10-CM | POA: Diagnosis not present

## 2021-10-26 DIAGNOSIS — S32592A Other specified fracture of left pubis, initial encounter for closed fracture: Secondary | ICD-10-CM | POA: Diagnosis not present

## 2021-10-26 DIAGNOSIS — S52502A Unspecified fracture of the lower end of left radius, initial encounter for closed fracture: Secondary | ICD-10-CM | POA: Diagnosis not present

## 2021-11-04 NOTE — Progress Notes (Signed)
NP attempted visit. Patient unavailable, she was off unit.

## 2021-11-10 ENCOUNTER — Non-Acute Institutional Stay (SKILLED_NURSING_FACILITY): Payer: PPO | Admitting: Nurse Practitioner

## 2021-11-10 ENCOUNTER — Encounter: Payer: Self-pay | Admitting: Nurse Practitioner

## 2021-11-10 DIAGNOSIS — M170 Bilateral primary osteoarthritis of knee: Secondary | ICD-10-CM

## 2021-11-10 DIAGNOSIS — F0390 Unspecified dementia without behavioral disturbance: Secondary | ICD-10-CM

## 2021-11-10 DIAGNOSIS — B372 Candidiasis of skin and nail: Secondary | ICD-10-CM

## 2021-11-10 DIAGNOSIS — T148XXA Other injury of unspecified body region, initial encounter: Secondary | ICD-10-CM

## 2021-11-10 DIAGNOSIS — I10 Essential (primary) hypertension: Secondary | ICD-10-CM

## 2021-11-10 DIAGNOSIS — Z515 Encounter for palliative care: Secondary | ICD-10-CM

## 2021-11-10 DIAGNOSIS — I480 Paroxysmal atrial fibrillation: Secondary | ICD-10-CM

## 2021-11-10 DIAGNOSIS — I6930 Unspecified sequelae of cerebral infarction: Secondary | ICD-10-CM

## 2021-11-10 NOTE — Progress Notes (Addendum)
Location:  [facility]) Nursing Home Room Number: 304-A Place of Service:  SNF [phone]) Provider:  Carlos American. Dewaine Oats, NP    Patient Care Team: Dewayne Shorter, MD as PCP - General [facility])  Extended Emergency Contact Information Primary Emergency Contact: Miguel Rota [place] Phone: [phone] Relation: Brother  Code Status: DNR  Goals of care: Advanced Directive information    10/21/2021    4:20 PM  Advanced Directives  Does Patient Have a Medical Advance Directive? Yes  Type of Advance Directive Out of facility DNR (pink MOST or yellow form)  Does patient want to make changes to medical advance directive? No - Patient declined     Chief Complaint  Patient presents with   Medical Management of Chronic Issues    Routine visit, discuss need for shingrix,dexa, pneumonia, covid and flu vaccine or post pone if patient refuses.     HPI:  Pt is a 81 y.o. female seen today for an routine follow up and skin concerns.  Pt with hx of depression, htn, dementia, constipation, hyperlipidemia, GERD and neuropathy. She is currently living at [facility] under hospice care.  Staff noted blisters on her left hip yesterday on 11/09/21.  no blisters today but has several open ares.  Also noted to have yeast under left breast.  She is nonverbal and requires care for all ALDs.    Past Medical History:  Diagnosis Date   Anemia    Arthritis    Body mass index (bmi) 37.0-37.9, adult    Breast cancer ([place]) 01/02/2016   right breast/radiation   Cancer ([place]) 2017   BREAST   Chronic insomnia    Complication of anesthesia    has difficulty last surgery intubating   Depression    Difficult intubation    WITH TKR   Dizziness    Dysrhythmia    PVC'S,BRADYCARDIA   GERD (gastroesophageal reflux disease)    History of chicken pox    Hypertension    Hyponatremia    Intermittent vertigo    Irregular heart beat    Memory  difficulties    Mild mitral regurgitation    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HCC)    MVP (mitral valve prolapse)    Orthopnea    Personal history of radiation therapy 2017   Pre-diabetes    PVC's (premature ventricular contractions)    Reflux    Sleep apnea    CPAP   Past Surgical History:  Procedure Laterality Date   APPENDECTOMY     BREAST BIOPSY Right 01/02/2016   PARTIAL MASTECTOMY- [facility] and [facility]   BREAST LUMPECTOMY Right 2017   CATARACT EXTRACTION W/PHACO Right 09/14/2016   Procedure: CATARACT EXTRACTION PHACO AND INTRAOCULAR LENS PLACEMENT ([place]);  Surgeon: Birder Robson, MD;  Location: [facility] ORS;  Service: Ophthalmology;  Laterality: Right;  [place] 00:32 AP% 19.5 CDE 6.28 Fluid pack lot # [id number]   CATARACT EXTRACTION W/PHACO Left 10/05/2016   Procedure: CATARACT EXTRACTION PHACO AND INTRAOCULAR LENS PLACEMENT ([facility]);  Surgeon: Birder Robson, MD;  Location: [facility] ORS;  Service: Ophthalmology;  Laterality: Left;  [place] 00:46.9 AP% 16.0 CDE 7.48 Fluid Pack lot # [id number]   CHOLECYSTECTOMY     COLONOSCOPY WITH PROPOFOL N/A 08/24/2016   Procedure: COLONOSCOPY WITH PROPOFOL;  Surgeon: Lollie Sails, MD;  Location: [facility] ENDOSCOPY;  Service: Endoscopy;  Laterality: N/A;   COLONOSCOPY WITH PROPOFOL N/A 07/12/2018   Procedure: COLONOSCOPY WITH PROPOFOL;  Surgeon: Alice Reichert, Benay Pike, MD;  Location: [facility]  ENDOSCOPY;  Service: Gastroenterology;  Laterality: N/A;   dental implants     FOOT SURGERY Right    FRACTURE SURGERY     KNEE CAP   HEMORRHOID SURGERY N/A 04/19/2019   Procedure: HEMORRHOIDECTOMY;  Surgeon: Benjamine Sprague, DO;  Location: [facility] ORS;  Service: General;  Laterality: N/A;   HIP ARTHROPLASTY Left 08/20/2021   Procedure: ARTHROPLASTY BIPOLAR HIP (HEMIARTHROPLASTY);  Surgeon: Thornton Park, MD;  Location: [facility] ORS;  Service: Orthopedics;  Laterality: Left;   JOINT REPLACEMENT Right 2011   KNEE   PARTIAL MASTECTOMY WITH NEEDLE LOCALIZATION Right 01/02/2016   Procedure: PARTIAL  MASTECTOMY WITH NEEDLE LOCALIZATION;  Surgeon: Leonie Green, MD;  Location: [facility] ORS;  Service: General;  Laterality: Right;   PATELLA FRACTURE SURGERY     REPLACEMENT TOTAL KNEE Right    SENTINEL NODE BIOPSY Right 01/02/2016   Procedure: SENTINEL NODE BIOPSY;  Surgeon: Leonie Green, MD;  Location: [facility] ORS;  Service: General;  Laterality: Right;   TONSILLECTOMY      Allergies  Allergen Reactions   Cleocin [Clindamycin Hcl] Swelling, Rash and Other (See Comments)    Tongue, lip swelling Tongue, lip swelling Tongue, lip swelling   Pen Vk [Penicillin V] Swelling, Rash and Other (See Comments)    Tongue, lip swelling Funny feeling on the tongue  Has patient had a PCN reaction causing immediate rash, facial/tongue/throat swelling, SOB or lightheadedness with hypotension:Yes Has patient had a PCN reaction causing severe rash involving mucus membranes or skin necrosis:No Has patient had a PCN reaction that required hospitalization:No Has patient had a PCN reaction occurring within the last 10 years:No If all of the above answers are "NO", then may proceed with Cephalosporin use.     Outpatient Encounter Medications as of 11/10/2021  Medication Sig   acetaminophen (TYLENOL) 325 MG tablet Take 650 mg by mouth every 8 (eight) hours as needed.   acetaminophen (TYLENOL) 500 MG tablet Take 1,000 mg by mouth at bedtime.   carvedilol (COREG) 6.25 MG tablet Take 1 tablet (6.25 mg total) by mouth 2 (two) times daily with a meal.   citalopram (CELEXA) 10 MG tablet Take 10 mg by mouth every other day.   hydrocortisone 2.5 % cream Apply topically every 8 (eight) hours as needed. For hemorrhoid relief   lactase (LACTAID) 3000 units tablet Take 1 tablet by mouth daily.   melatonin 3 MG TABS tablet Take 3 mg by mouth at bedtime.   METOPROLOL TARTRATE PO Take 12.5 mg by mouth every 6 (six) hours as needed.   MODERNA COVID-19 BIVALENT 50 MCG/0.5ML injection    morphine (ROXANOL) 20 MG/ML  concentrated solution Take 0.25 mLs (5 mg total) by mouth every hour as needed for severe pain or shortness of breath (air hunger).   polyethylene glycol (MIRALAX / GLYCOLAX) 17 g packet Take 17 g by mouth daily.   traZODone (DESYREL) 50 MG tablet Take 50 mg by mouth at bedtime.   No facility-administered encounter medications on file as of 11/10/2021.    Review of Systems  Unable to perform ROS: Dementia    Immunization History  Administered Date(s) Administered   Influenza Inj Mdck Quad Pf 11/15/2017   Influenza-Unspecified 11/01/2013, 11/02/2014, 10/30/2015, 12/17/2018   Moderna Sars-Covid-2 Vaccination 03/19/2019, 04/16/2019   Pneumococcal Conjugate-13 04/02/2015   Pneumococcal-Unspecified 02/01/2002, 11/02/2006   Tdap 03/12/2018, 07/13/2020   Pertinent  Health Maintenance Due  Topic Date Due   DEXA SCAN  Never done   INFLUENZA VACCINE  09/01/2021  08/21/2021    7:45 PM 08/22/2021    3:00 PM 08/22/2021    7:45 PM 08/23/2021    4:00 PM 10/21/2021    4:21 PM  Fall Risk  Falls in the past year?     0  Patient Fall Risk Level High fall risk High fall risk High fall risk High fall risk Low fall risk  Patient at Risk for Falls Due to     No Fall Risks  Fall risk Follow up     Falls evaluation completed   Functional Status Survey:    Vitals:   11/10/21 1348  BP: 131/76  Pulse: (!) 112  Resp: 20  SpO2: 96%  Weight: 189 lb (85.7 kg)  Height: '5\' 1"'$  (1.549 m)   Body mass index is 34.99 kg/m. Physical Exam Constitutional:      General: She is not in acute distress.    Appearance: She is well-developed. She is not diaphoretic.  HENT:     Head: Normocephalic and atraumatic.     Mouth/Throat:     Pharynx: No oropharyngeal exudate.  Eyes:     Conjunctiva/sclera: Conjunctivae normal.     Pupils: Pupils are equal, round, and reactive to light.  Cardiovascular:     Rate and Rhythm: Tachycardia present.     Heart sounds: Normal heart sounds.  Pulmonary:     Effort:  Pulmonary effort is normal.     Breath sounds: Normal breath sounds.  Abdominal:     General: Bowel sounds are normal.     Palpations: Abdomen is soft.  Musculoskeletal:     Cervical back: Normal range of motion and neck supple.     Right lower leg: No edema.     Left lower leg: No edema.  Skin:    General: Skin is warm and dry.     Findings: Rash (yeast rash under left breast) present.     Comments: Left hip with several small open areas, no blisters noted today. Small amount of erythema around areas with drainage or heat. No sign of infection   Neurological:     Mental Status: She is alert. Mental status is at baseline.     Motor: Weakness present.     Gait: Gait abnormal.  Psychiatric:        Mood and Affect: Mood normal.     Labs reviewed: Recent Labs    08/21/21 0441 08/22/21 0429 08/23/21 0418  NA 139 139 134*  K 4.3 4.9 4.0  CL 109 107 107  CO2 '24 25 22  '$ GLUCOSE 89 88 125*  BUN 42* 52* 42*  CREATININE 1.22* 1.26* 0.91  CALCIUM 8.5* 8.6* 8.1*   Recent Labs    12/08/20 0000 08/18/21 1123  AST 25 32  ALT 27 28  ALKPHOS 98 115  BILITOT  --  0.7  PROT  --  6.5  ALBUMIN 3.6 3.4*   Recent Labs    12/08/20 0000 08/18/21 1123 08/19/21 0527 08/20/21 0417 08/21/21 0441 08/22/21 0429  WBC 7.7 23.7*   < > 15.4* 17.0* 14.5*  NEUTROABS 4,905.00 20.4*  --   --   --   --   HGB 10.6* 11.6*   < > 10.2* 9.5* 9.7*  HCT 32* 35.4*   < > 31.1* 29.6* 30.0*  MCV  --  89.4   < > 88.9 92.2 91.2  PLT 435* 429*   < > 305 278 306   < > = values in this interval not displayed.  Lab Results  Component Value Date   TSH 0.754 07/14/2020   Lab Results  Component Value Date   HGBA1C 5.3 09/25/2020   Lab Results  Component Value Date   CHOL 115 12/08/2020   HDL 39 12/08/2020   LDLCALC 57 12/08/2020   TRIG 102 12/08/2020   CHOLHDL 3.8 09/25/2020    Significant Diagnostic Results in last 30 days:  No results found.  Assessment/Plan 1. Primary osteoarthritis of  both knees -stable, no signs of uncontrolled pain.   2. Dementia without behavioral disturbance (HCC) -Stable, no acute changes in cognitive or functional status, continue supportive care at SNF  3. Essential hypertension -Blood pressure well controlled, goal bp <140/90 Continue current medications and dietary modifications  4. Hospice care patient -continues on hospice care with comfort as priority   5. Excoriation Blistered noted to hip now with excoriation. No signs of infection. To use A&D ointment BID until healed. Notify for worsening redness, drainage.   6. Yeast dermatitis -nystatin BID to effected areas  7. PAF (paroxysmal atrial fibrillation) (HCC) -stable, as PRN metoprolol for elevated HR. No anticoagulation at this time due to bleeding risk.   8. Late effect of cerebrovascular accident (CVA) Stable, continues supportive care at [facility]. No longer on plavix due to hx of large hematoma and fall risk  Fairley Copher K. [place], [facility] [phone]

## 2021-11-12 ENCOUNTER — Encounter: Payer: Self-pay | Admitting: Nurse Practitioner

## 2021-11-13 ENCOUNTER — Other Ambulatory Visit: Payer: Self-pay | Admitting: Student

## 2021-11-13 DIAGNOSIS — S301XXA Contusion of abdominal wall, initial encounter: Secondary | ICD-10-CM

## 2021-11-13 MED ORDER — MORPHINE SULFATE (CONCENTRATE) 20 MG/ML PO SOLN: 5.0000 mg | ORAL | 0 refills | Status: AC | PRN

## 2021-12-02 DEATH — deceased

## 2021-12-14 ENCOUNTER — Telehealth: Payer: Self-pay

## 2021-12-14 NOTE — Telephone Encounter (Signed)
Completed Death Certificate today.

## 2021-12-14 NOTE — Telephone Encounter (Signed)
Chanel with [employer] called regarding death certificate. She states that death certificate was uploaded to [facility] in October.  She states that they are still waiting for it to be signed off on.  Message routed to Dr. Dewayne Shorter

## 2022-05-12 IMAGING — CT CT HEAD W/O CM
3 series · 14 of 47 positions shown, 16 images · non-contrast
Comparison: [DATE] [DATE], [DATE], [DATE] [DATE], [DATE]
COMPARISON: [DATE] [DATE], [DATE], [DATE] [DATE], [DATE]

Addendum:
CLINICAL DATA: Fall

EXAM:
CT HEAD WITHOUT CONTRAST
CT CERVICAL SPINE WITHOUT CONTRAST
TECHNIQUE: Multidetector CT imaging of the head and cervical spine was
performed following the standard protocol without intravenous
contrast. Multiplanar CT image reconstructions of the cervical spine
were also generated.

[Series 2: head wo · axial · 0.39mm/px · z∈[-90,+35]mm · 8 of 31 slices shown, 10 images]
[im 3/31  brain]
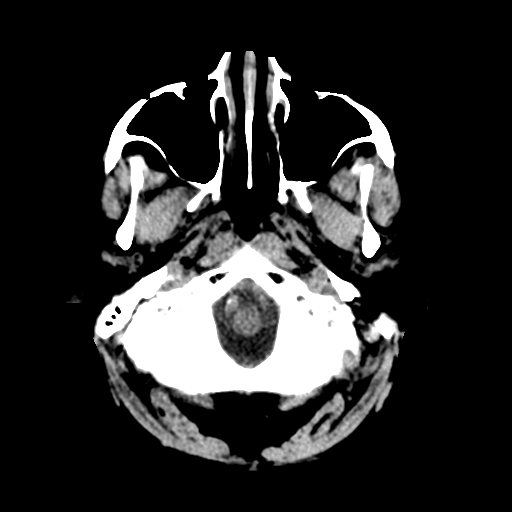
[im 3/31  bone]
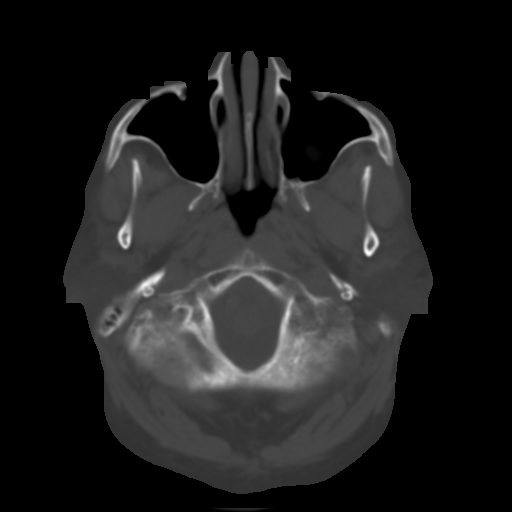
[im 7/31  brain]
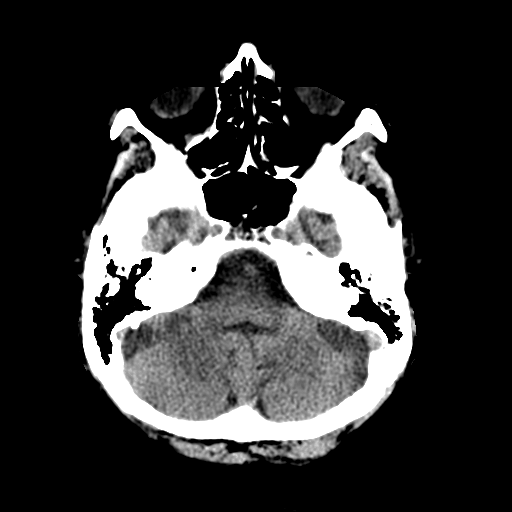
[im 10/31  brain]
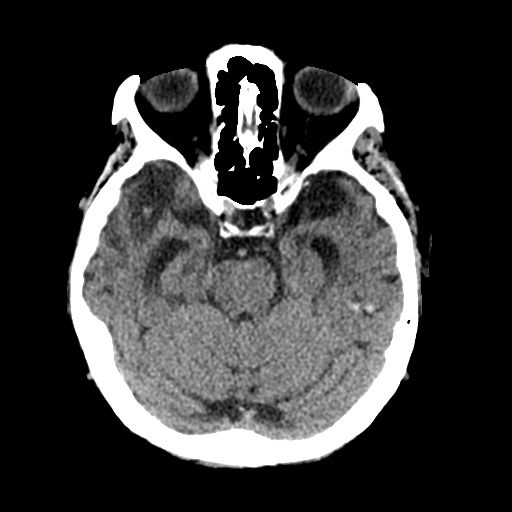
[im 14/31  brain]
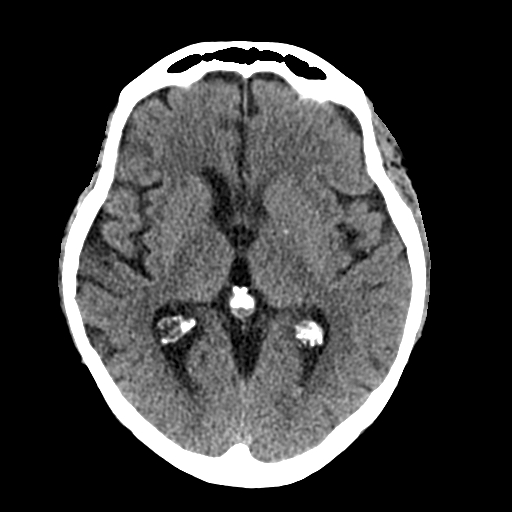
[im 17/31  brain]
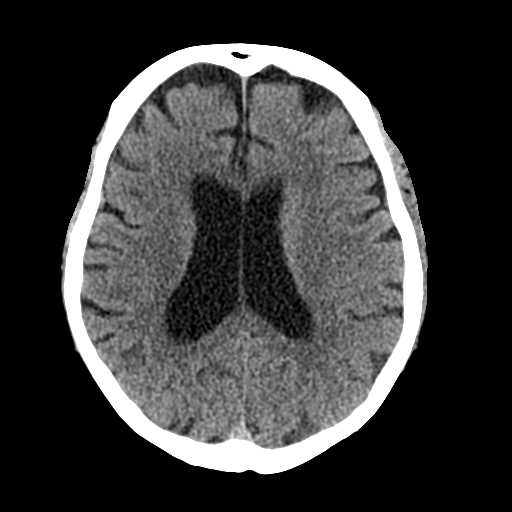
[im 17/31  bone]
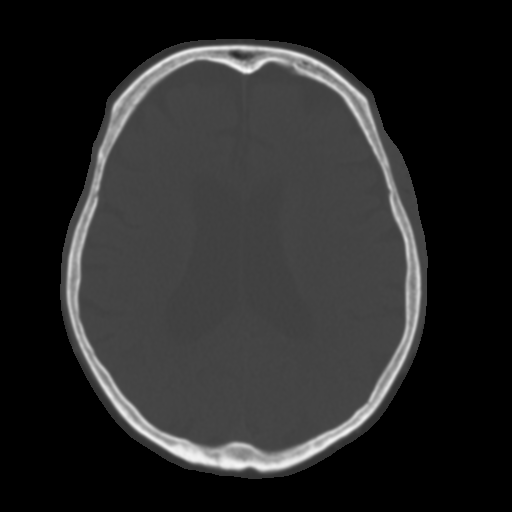
[im 21/31  brain]
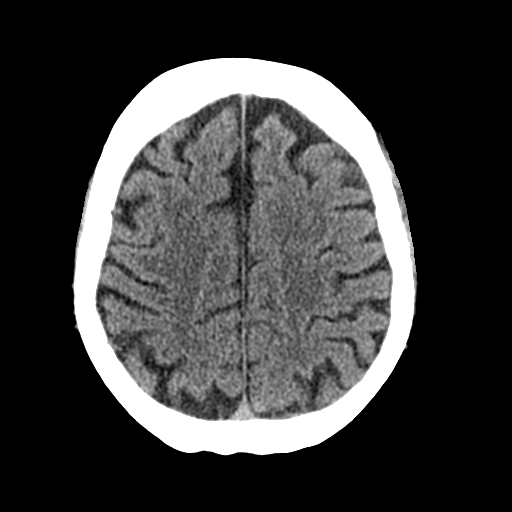
[im 24/31  brain]
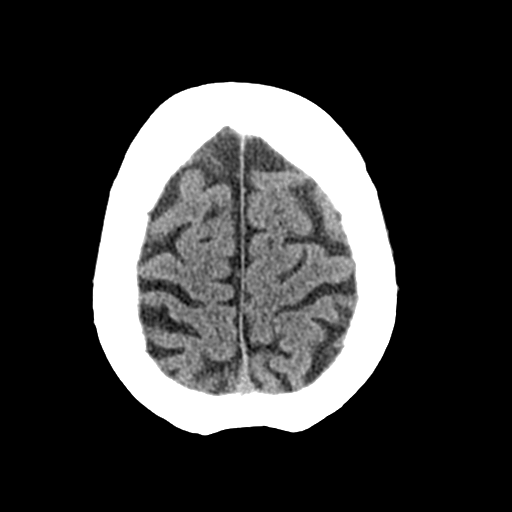
[im 28/31  brain]
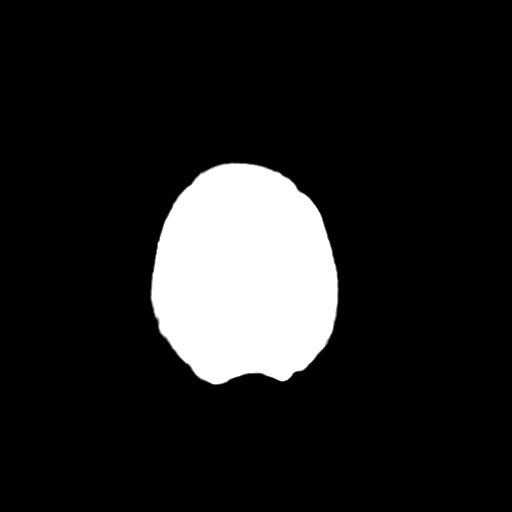

[Series 4: coronal soft tissue · coronal · 0.30mm/px · 3 of 64 slices shown]
[im 22/64  brain]
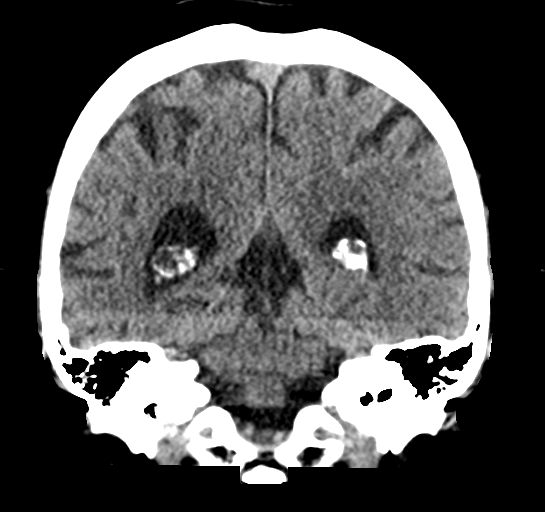
[im 29/64  brain]
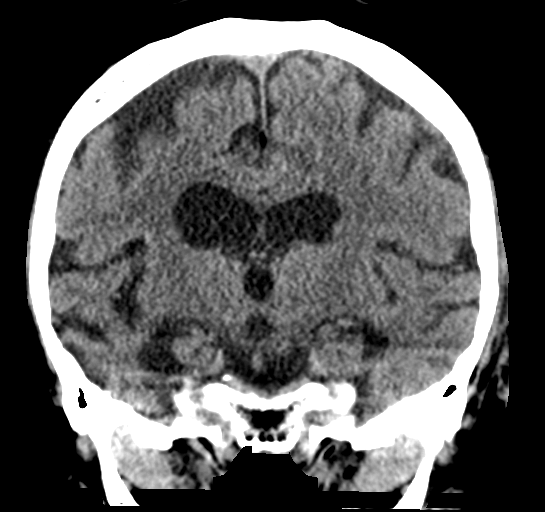
[im 36/64  brain]
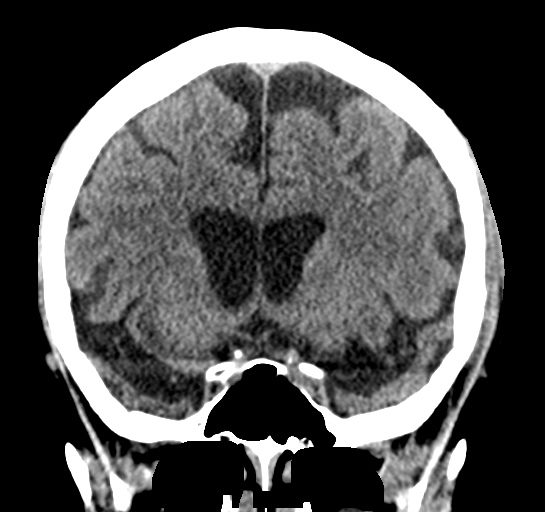

[Series 5: sagittal soft tissue · sagittal · 0.30mm/px · 3 of 56 slices shown]
[im 19/56  brain]
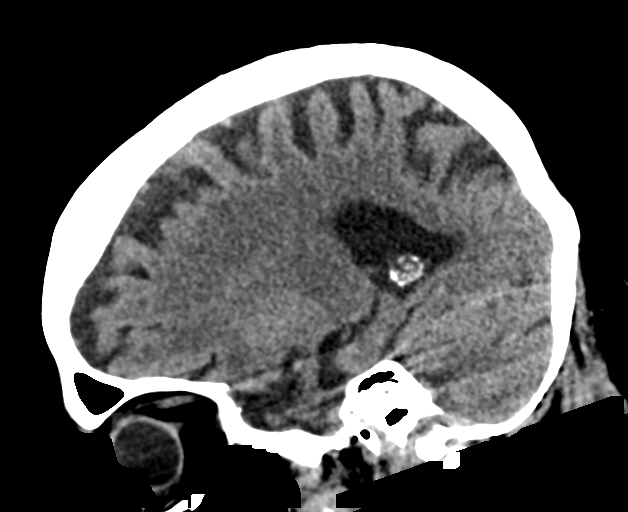
[im 28/56  brain]
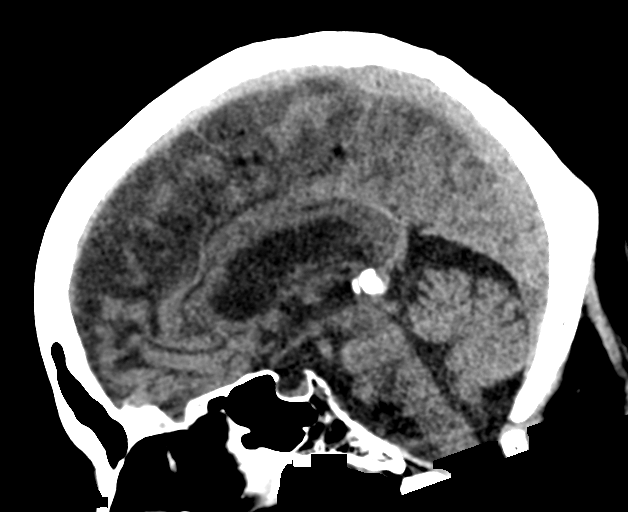
[im 37/56  brain]
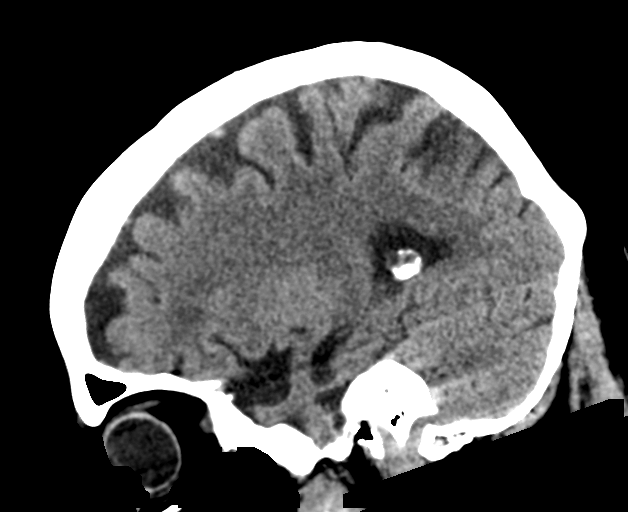

[14 of 47 positions shown; findings below may reference images not displayed]

FINDINGS: CT HEAD FINDINGS

Brain: There is trace high density material layering within the
posterior horn of the LEFT lateral ventricle (series 5, image 35;
series 2, image 14). This is new since prior. No evidence of acute
infarction or hydrocephalus. Revisualization advanced atrophy of the
RIGHT greater than LEFT temporal lobes. Periventricular white matter
hypodensities consistent with sequela of chronic microvascular
ischemic disease.

Vascular: Vascular calcifications.

Skull: Normal. Negative for fracture or focal lesion.

Sinuses/Orbits: Mucous retention cyst of the LEFT maxillary sinus.

Other: None.

CT CERVICAL SPINE FINDINGS

Alignment: Unchanged minimal anterolisthesis of C5-6 and C6-7.

Skull base and vertebrae: No acute fracture. Unchanged minimal
wedging of C7 vertebral body.

Soft tissues and spinal canal: No prevertebral fluid or swelling. No
visible canal hematoma.

Disc levels: Mild degenerative changes of C3-4 and C4-5. Osseous
ankylosis of the LEFT facets of C4-5. Multilevel facet arthropathy
results in multilevel osseous neuroforaminal narrowing most
pronounced at LEFT C4-5.

Upper chest: Negative.

Other: Atherosclerotic calcifications of the carotid arteries. There
is sclerotic calcifications of the aorta.
IMPRESSION: 1. There is a trace amount of layering hyperdensity within the
posterior horn of the LEFT lateral ventricle. This could reflect a
small amount of intraventricular blood products. Recommend
short-term follow-up.
2. No acute fracture or static subluxation of the cervical spine.
Unchanged mild multilevel anterolisthesis, degenerative in etiology.

HENRICHAS system was activated at 07/13/2020 at [DATE]. An addendum will
be issued upon telephonic communication with ordering provider.

Aortic Atherosclerosis (PKFBO-6JO.O).

ADDENDUM:
These results were called by telephone at the time of interpretation
on 07/13/2020 at [DATE] to provider Dr. Limon, who verbally
acknowledged these results.

*** End of Addendum ***
FINDINGS: CT HEAD FINDINGS

Brain: There is trace high density material layering within the
posterior horn of the LEFT lateral ventricle (series 5, image 35;
series 2, image 14). This is new since prior. No evidence of acute
infarction or hydrocephalus. Revisualization advanced atrophy of the
RIGHT greater than LEFT temporal lobes. Periventricular white matter
hypodensities consistent with sequela of chronic microvascular
ischemic disease.

Vascular: Vascular calcifications.

Skull: Normal. Negative for fracture or focal lesion.

Sinuses/Orbits: Mucous retention cyst of the LEFT maxillary sinus.

Other: None.

CT CERVICAL SPINE FINDINGS

Alignment: Unchanged minimal anterolisthesis of C5-6 and C6-7.

Skull base and vertebrae: No acute fracture. Unchanged minimal
wedging of C7 vertebral body.

Soft tissues and spinal canal: No prevertebral fluid or swelling. No
visible canal hematoma.

Disc levels: Mild degenerative changes of C3-4 and C4-5. Osseous
ankylosis of the LEFT facets of C4-5. Multilevel facet arthropathy
results in multilevel osseous neuroforaminal narrowing most
pronounced at LEFT C4-5.

Upper chest: Negative.

Other: Atherosclerotic calcifications of the carotid arteries. There
is sclerotic calcifications of the aorta.
IMPRESSION: 1. There is a trace amount of layering hyperdensity within the
posterior horn of the LEFT lateral ventricle. This could reflect a
small amount of intraventricular blood products. Recommend
short-term follow-up.
2. No acute fracture or static subluxation of the cervical spine.
Unchanged mild multilevel anterolisthesis, degenerative in etiology.

HENRICHAS system was activated at 07/13/2020 at [DATE]. An addendum will
be issued upon telephonic communication with ordering provider.

Aortic Atherosclerosis (PKFBO-6JO.O).

## 2022-05-12 IMAGING — CR DG FINGER THUMB 2+V*R*
3 series · 3 of 3 positions shown · non-contrast
Comparison: None.

CLINICAL DATA: Fell.  Injured right thumb.

EXAM:
RIGHT THUMB 2+V

[finger ap]
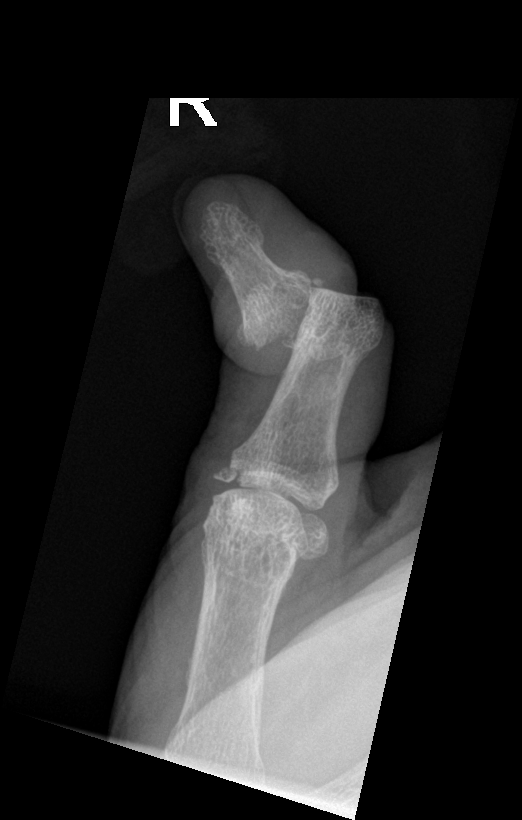

[finger obl]
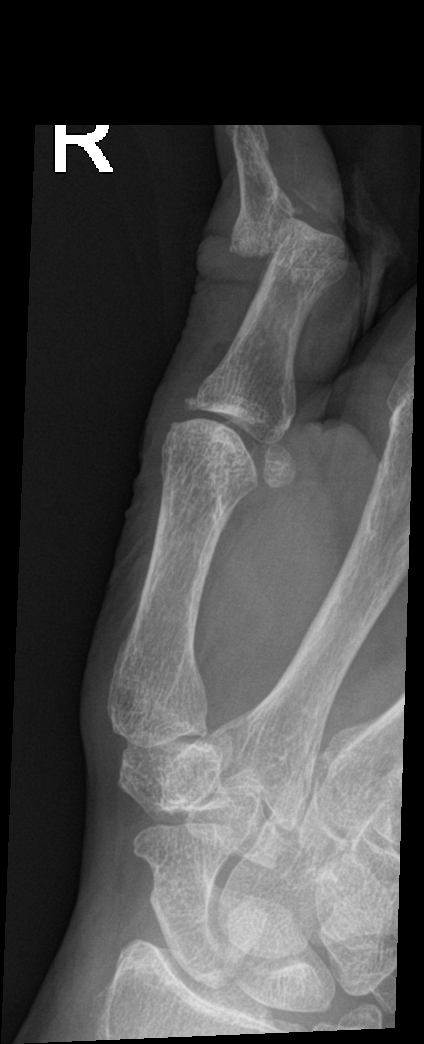

[finger lat]
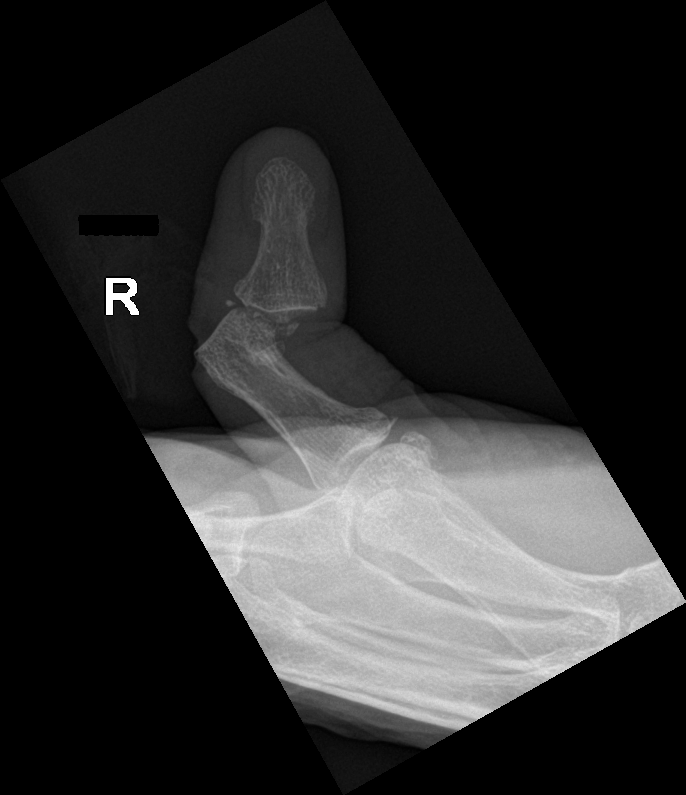

[3 of 3 positions shown; findings below may reference images not displayed]

FINDINGS: Dorsal and radial dislocation of the distal phalanx of the thumb.
Associated small avulsion type fractures are noted. There is also a
avulsion fracture involving the radial aspect of the proximal
phalanx at the MCP joint.
IMPRESSION: 1. Dislocation of the distal phalanx of the thumb with small
avulsion type fractures.
2. Avulsion fracture involving the radial aspect of the proximal
phalanx at the MCP joint.
